# Patient Record
Sex: Female | Born: 1985
Health system: Southern US, Community
[De-identification: ages and names within clinical notes are randomized; demographics above are authoritative.]

## PROBLEM LIST (undated history)

## (undated) DIAGNOSIS — J302 Other seasonal allergic rhinitis: Secondary | ICD-10-CM

## (undated) DIAGNOSIS — M1710 Unilateral primary osteoarthritis, unspecified knee: Secondary | ICD-10-CM

## (undated) DIAGNOSIS — E282 Polycystic ovarian syndrome: Secondary | ICD-10-CM

## (undated) DIAGNOSIS — R112 Nausea with vomiting, unspecified: Secondary | ICD-10-CM

## (undated) DIAGNOSIS — N970 Female infertility associated with anovulation: Secondary | ICD-10-CM

## (undated) DIAGNOSIS — N840 Polyp of corpus uteri: Secondary | ICD-10-CM

## (undated) DIAGNOSIS — Z9889 Other specified postprocedural states: Secondary | ICD-10-CM

## (undated) DIAGNOSIS — M797 Fibromyalgia: Secondary | ICD-10-CM

## (undated) DIAGNOSIS — M7918 Myalgia, other site: Secondary | ICD-10-CM

## (undated) DIAGNOSIS — F329 Major depressive disorder, single episode, unspecified: Secondary | ICD-10-CM

## (undated) DIAGNOSIS — F32A Depression, unspecified: Secondary | ICD-10-CM

## (undated) DIAGNOSIS — I10 Essential (primary) hypertension: Secondary | ICD-10-CM

## (undated) DIAGNOSIS — M179 Osteoarthritis of knee, unspecified: Secondary | ICD-10-CM

## (undated) DIAGNOSIS — Z87440 Personal history of urinary (tract) infections: Secondary | ICD-10-CM

## (undated) HISTORY — DX: Female infertility associated with anovulation: N97.0

## (undated) HISTORY — PX: POLYPECTOMY: SHX149

## (undated) HISTORY — DX: Major depressive disorder, single episode, unspecified: F32.9

## (undated) HISTORY — DX: Depression, unspecified: F32.A

---
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
## 2008-10-26 ENCOUNTER — Ambulatory Visit: Payer: Self-pay | Admitting: Family Medicine

## 2008-10-26 DIAGNOSIS — N912 Amenorrhea, unspecified: Secondary | ICD-10-CM | POA: Insufficient documentation

## 2008-10-26 DIAGNOSIS — E282 Polycystic ovarian syndrome: Secondary | ICD-10-CM | POA: Insufficient documentation

## 2008-10-26 DIAGNOSIS — Z9884 Bariatric surgery status: Secondary | ICD-10-CM | POA: Insufficient documentation

## 2008-10-26 LAB — CONVERTED CEMR LAB
Beta hcg, urine, semiquantitative: NEGATIVE
Bilirubin Urine: NEGATIVE
Blood Glucose, AC Bkfst: 91 mg/dL
Glucose, Urine, Semiquant: NEGATIVE
Ketones, urine, test strip: NEGATIVE
pH: 5.5

## 2008-10-27 ENCOUNTER — Encounter: Payer: Self-pay | Admitting: Family Medicine

## 2008-11-03 LAB — CONVERTED CEMR LAB
ALT: 30 units/L (ref 0–35)
AST: 24 units/L (ref 0–37)
CO2: 23 meq/L (ref 19–32)
Calcium: 8.8 mg/dL (ref 8.4–10.5)
Chloride: 105 meq/L (ref 96–112)
Cholesterol: 180 mg/dL (ref 0–200)
FSH: 4.5 milliintl units/mL
Potassium: 4.5 meq/L (ref 3.5–5.3)
Prolactin: 6 ng/mL
Sodium: 143 meq/L (ref 135–145)
TSH: 4.61 microintl units/mL (ref 0.700–6.400)
Total Protein: 7 g/dL (ref 6.0–8.3)

## 2008-11-25 ENCOUNTER — Ambulatory Visit: Payer: Self-pay | Admitting: Family Medicine

## 2008-11-25 DIAGNOSIS — I10 Essential (primary) hypertension: Secondary | ICD-10-CM | POA: Insufficient documentation

## 2008-11-25 LAB — CONVERTED CEMR LAB
Blood Glucose, AC Bkfst: 116 mg/dL
Glucose, Urine, Semiquant: NEGATIVE
Nitrite: NEGATIVE
Protein, U semiquant: NEGATIVE
pH: 5.5

## 2008-11-26 ENCOUNTER — Encounter: Payer: Self-pay | Admitting: Family Medicine

## 2008-11-30 DIAGNOSIS — R7301 Impaired fasting glucose: Secondary | ICD-10-CM | POA: Insufficient documentation

## 2008-12-02 ENCOUNTER — Encounter: Payer: Self-pay | Admitting: Family Medicine

## 2008-12-06 ENCOUNTER — Telehealth (INDEPENDENT_AMBULATORY_CARE_PROVIDER_SITE_OTHER): Payer: Self-pay | Admitting: *Deleted

## 2008-12-21 ENCOUNTER — Telehealth: Payer: Self-pay | Admitting: Family Medicine

## 2009-01-09 ENCOUNTER — Ambulatory Visit: Payer: Self-pay | Admitting: Family Medicine

## 2009-02-28 ENCOUNTER — Ambulatory Visit: Payer: Self-pay | Admitting: Family Medicine

## 2009-02-28 DIAGNOSIS — N3000 Acute cystitis without hematuria: Secondary | ICD-10-CM | POA: Insufficient documentation

## 2009-02-28 LAB — CONVERTED CEMR LAB
Bilirubin Urine: NEGATIVE
Ketones, urine, test strip: NEGATIVE
Nitrite: NEGATIVE
Protein, U semiquant: NEGATIVE
Urobilinogen, UA: 0.2

## 2009-03-01 ENCOUNTER — Encounter: Payer: Self-pay | Admitting: Family Medicine

## 2009-03-08 ENCOUNTER — Telehealth: Payer: Self-pay | Admitting: Family Medicine

## 2009-03-20 ENCOUNTER — Telehealth: Payer: Self-pay | Admitting: Family Medicine

## 2009-04-07 ENCOUNTER — Ambulatory Visit: Payer: Self-pay | Admitting: Family Medicine

## 2009-04-07 ENCOUNTER — Telehealth: Payer: Self-pay | Admitting: Family Medicine

## 2009-04-07 DIAGNOSIS — R300 Dysuria: Secondary | ICD-10-CM | POA: Insufficient documentation

## 2009-04-07 LAB — CONVERTED CEMR LAB
Bilirubin Urine: NEGATIVE
Glucose, Urine, Semiquant: NEGATIVE
Ketones, urine, test strip: NEGATIVE
Protein, U semiquant: 100
Urobilinogen, UA: 0.2
pH: 5.5

## 2009-04-08 ENCOUNTER — Encounter: Payer: Self-pay | Admitting: Family Medicine

## 2009-04-12 ENCOUNTER — Ambulatory Visit: Payer: Self-pay | Admitting: Family Medicine

## 2009-04-13 ENCOUNTER — Telehealth (INDEPENDENT_AMBULATORY_CARE_PROVIDER_SITE_OTHER): Payer: Self-pay | Admitting: *Deleted

## 2009-07-04 ENCOUNTER — Ambulatory Visit: Payer: Self-pay | Admitting: Family Medicine

## 2009-07-09 ENCOUNTER — Encounter: Payer: Self-pay | Admitting: Family Medicine

## 2009-08-15 ENCOUNTER — Encounter: Payer: Self-pay | Admitting: Family Medicine

## 2009-10-05 ENCOUNTER — Ambulatory Visit: Payer: Self-pay | Admitting: Family Medicine

## 2009-10-05 LAB — CONVERTED CEMR LAB: Blood Glucose, AC Bkfst: 93 mg/dL

## 2009-11-02 ENCOUNTER — Encounter: Payer: Self-pay | Admitting: Family Medicine

## 2009-11-03 ENCOUNTER — Encounter: Payer: Self-pay | Admitting: Family Medicine

## 2009-11-03 LAB — CONVERTED CEMR LAB: Free T4: 1.1 ng/dL (ref 0.80–1.80)

## 2009-11-07 LAB — CONVERTED CEMR LAB
ALT: 23 units/L (ref 0–35)
AST: 14 units/L (ref 0–37)
Albumin: 3.7 g/dL (ref 3.5–5.2)
CO2: 26 meq/L (ref 19–32)
Calcium: 8.6 mg/dL (ref 8.4–10.5)
Chloride: 105 meq/L (ref 96–112)
Cholesterol: 152 mg/dL (ref 0–200)
Creatinine, Ser: 0.82 mg/dL (ref 0.40–1.20)
Hgb A1c MFr Bld: 5.7 % — ABNORMAL HIGH (ref <5.7)
Potassium: 4 meq/L (ref 3.5–5.3)
Sodium: 141 meq/L (ref 135–145)
TSH: 4.659 microintl units/mL — ABNORMAL HIGH (ref 0.350–4.500)
Total CHOL/HDL Ratio: 4.9
Total Protein: 6.2 g/dL (ref 6.0–8.3)

## 2009-12-12 ENCOUNTER — Encounter: Payer: Self-pay | Admitting: Family Medicine

## 2010-01-03 ENCOUNTER — Encounter: Payer: Self-pay | Admitting: Family Medicine

## 2010-01-05 ENCOUNTER — Ambulatory Visit: Payer: Self-pay | Admitting: Family Medicine

## 2010-03-27 NOTE — Letter (Signed)
Summary: [facility] [place]   Imported By: Lanelle Bal 07/17/2009 13:09:37  _____________________________________________________________________  External Attachment:    Type:   Image     Comment:   External Document

## 2010-03-27 NOTE — Assessment & Plan Note (Signed)
Summary: UA  Nurse Visit   Vitals Entered By: Payton Spark CMA (April 07, 2009 3:16 PM)  Allergies: 1)  ! Pcn 2)  ! Amoxicillin 3)  ! Ceclor 4)  ! Sulfa 5)  ! Erythromycin 6)  ! Enalapril Maleate Laboratory Results   Urine Tests    Routine Urinalysis   Color: yellow Appearance: Hazy Glucose: negative   (Normal Range: Negative) Bilirubin: negative   (Normal Range: Negative) Ketone: negative   (Normal Range: Negative) Spec. Gravity: 1.025   (Normal Range: 1.003-1.035) Blood: large   (Normal Range: Negative) pH: 5.5   (Normal Range: 5.0-8.0) Protein: 100   (Normal Range: Negative) Urobilinogen: 0.2   (Normal Range: 0-1) Nitrite: negative   (Normal Range: Negative) Leukocyte Esterace: small   (Normal Range: Negative)       Orders Added: 1)  T-Culture, Urine [[id number]] 2)  UA Dipstick w/o Micro (automated)  [81003]    Impression & Recommendations:  Problem # 1:  DYSURIA (ICD-788.1) Sent for cx.  treated twice in the past 6 wks with abx.  UA is inconclusive. The following medications were removed from the medication list:    Cipro 250 Mg Tabs (Ciprofloxacin hcl) .Marland Kitchen... 1 tab by mouth two times a day x 3 days    Macrodantin 100 Mg Caps (Nitrofurantoin macrocrystal) .Marland Kitchen... 1 capsule by mouth q 12 hrs x 7 days  Orders: T-Culture, Urine ([id number]) UA Dipstick w/o Micro (automated)  (81003)  Complete Medication List: 1)  Victoza 18 Mg/65ml Soln (Liraglutide) .... 1.2 mg subcutaneously injection daily 2)  Onglyza 5 Mg Tabs (Saxagliptin hcl) .Marland Kitchen.. 1 tab by mouth daily with dinner 3)  Losartan Potassium 25 Mg Tabs (Losartan potassium) .Marland Kitchen.. 1 tab by mouth daily

## 2010-03-27 NOTE — Assessment & Plan Note (Signed)
Summary: UTI/ IFG   Vital Signs:  Patient profile:   25 year old female Height:      69 inches Weight:      323 pounds BMI:     47.87 O2 Sat:      97 % on Room air Temp:     97.6 degrees F oral BP sitting:   146 / 88  (left arm) Cuff size:   large  Vitals Entered By: Payton Spark CMA (February 28, 2009 3:00 PM)  O2 Flow:  Room air CC: ? UTI    Primary Care Provider:  Seymour Bars DO  CC:  ? UTI .  History of Present Illness: 25 yo WF presents for 1 days of painful urination. Urinary frequency but no urgency.  She is able to completely empty her bladder.  No incontinence.  No fevers or chills.  Has some pelvic pain.  Had a UTI in Sept 2010.  She did have a 7 day period in November. She has lost 12 lbs.  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   She is less hungry on Victoza.  Still trying to adjust portion sizes.  Her AM fastings are around 90-110.  She has IFG.  She is having diarrhea almost everyday on Metformin.      Current Medications (verified): 1)  Metformin Hcl 500 Mg Tabs (Metformin Hcl) .Marland Kitchen.. 1 Tab By Mouth Bid 2)  Enalapril Maleate 10 Mg Tabs (Enalapril Maleate) .Marland Kitchen.. 1 Tab By Mouth Daily 3)  Victoza 18 Mg/72ml Soln (Liraglutide) .... 1.2 Mg Subcutaneously Injection Daily 4)  Novofine 32g X 6 Mm Misc (Insulin Pen Needle) .... Use Once Daily As Directed 5)  Prodigy Blood Glucose Test  Strp (Glucose Blood) .... Use Daily As Directed Dx:  Impaired Fasting Glucose, Pcos  Allergies (verified): 1)  ! Pcn 2)  ! Amoxicillin 3)  ! Ceclor 4)  ! Sulfa 5)  ! Erythromycin  Past History:  Past Medical History: Reviewed history from 01/09/2009 and no changes required. class III obesity elevated BP w/o hx of HTN PCOS, age 34 recurrent UTI hx of depression IFG  Family History: Reviewed history from 10/26/2008 and no changes required. mother obese, PCOS, DM father HTN, high chol, diabetes, depresion sister depression  Social  History: Reviewed history from 10/26/2008 and no changes required. Has Assoc. Degree. Lesbian relationship w/ Marchelle Folks. Lives with mom, dad and sister. Looking for work. No exercise. Never smoked.  Denies ETOH.    Review of Systems      See HPI  Physical Exam  General:  obese WF in NAD Head:  normocephalic and atraumatic.   Mouth:  good dentition and pharynx pink and moist.   Neck:  no masses.   Lungs:  Normal respiratory effort, chest expands symmetrically. Lungs are clear to auscultation, no crackles or wheezes. Heart:  Normal rate and regular rhythm. S1 and S2 normal without gallop, murmur, click, rub or other extra sounds. Abdomen:  suprapubic TTP.  No CVAT.  soft.   Skin:  color normal.     Impression & Recommendations:  Problem # 1:  ACUTE CYSTITIS (ICD-595.0) Treat with Cipro x 3 days.  F/U cx results given hx of frequent recurrence and abx allergies. Her updated medication list for this problem includes:    Cipro 250 Mg Tabs (Ciprofloxacin hcl) .Marland Kitchen... 1 tab by mouth two times a day x 3 days  Orders: UA Dipstick w/o Micro (automated)  (81003) T-Culture, Urine ([id number])  Encouraged to push clear liquids, get enough rest, and take acetaminophen as needed. To be seen in 10 days if no improvement, sooner if worse.  Problem # 2:  IMPAIRED FASTING GLUCOSE (ICD-790.21) Sugars still a little high but happy with 12 lbs wt loss, improved diet and some physical activity with Victoza.  Stop Metformin due to diarrhea that is not improving.  Start Onglyza to help with insulin resistance.  F/U in 2 mos.  Needs A1C.  Call if any readings < 70.   The following medications were removed from the medication list:    Metformin Hcl 500 Mg Tabs (Metformin hcl) .Marland Kitchen... 1 tab by mouth bid Her updated medication list for this problem includes:    Victoza 18 Mg/31ml Soln (Liraglutide) .Marland Kitchen... 1.2 mg subcutaneously injection daily    Onglyza 5 Mg Tabs (Saxagliptin hcl) .Marland Kitchen... 1 tab by mouth daily  with dinner  Problem # 3:  ESSENTIAL HYPERTENSION, BENIGN (ICD-401.1) BP remains high with Enalapril.  Unable to add HCTZ due to sulfa allergy.  Recheck in 2 mos and if still high, needs 2nd agent. Her updated medication list for this problem includes:    Enalapril Maleate 10 Mg Tabs (Enalapril maleate) .Marland Kitchen... 1 tab by mouth daily  BP today: 146/88 Prior BP: 144/82 (01/09/2009)  Labs Reviewed: K+: 4.5 (10/27/2008) Creat: : 0.84 (10/27/2008)   Chol: 180 (10/27/2008)   HDL: 41 (10/27/2008)   LDL: 101 (10/27/2008)   TG: 188 (10/27/2008)  Complete Medication List: 1)  Enalapril Maleate 10 Mg Tabs (Enalapril maleate) .Marland Kitchen.. 1 tab by mouth daily 2)  Victoza 18 Mg/45ml Soln (Liraglutide) .... 1.2 mg subcutaneously injection daily 3)  Onglyza 5 Mg Tabs (Saxagliptin hcl) .Marland Kitchen.. 1 tab by mouth daily with dinner 4)  Cipro 250 Mg Tabs (Ciprofloxacin hcl) .Marland Kitchen.. 1 tab by mouth two times a day x 3 days  Patient Instructions: 1)  Take Cipro x 3 days for UTI. 2)  Will call you w/ urine cx result on Friday to make sure it's the right antibiotic. 3)  Drink water.  Take OTC AZO standard for comfort. 4)  Stay on Victoza. 5)  Work on portion sizes and increased physical activity. 6)  Stop Metformin due to diarrhea. 7)  Add Onglyza once daily with dinner. 8)  Return for f/u diabetes in 2 mos. Prescriptions: CIPRO 250 MG TABS (CIPROFLOXACIN HCL) 1 tab by mouth two times a day x 3 days  #6 x 0   Entered and Authorized by:   Seymour Bars DO   Signed by:   Seymour Bars DO on 02/28/2009   Method used:   Electronically to        [facility] [phone]* (retail)       [address] [place], [place]  [address]       Ph: [phone]       Fax: [phone]   RxID:   [id number] ONGLYZA 5 MG TABS (SAXAGLIPTIN HCL) 1 tab by mouth daily with dinner  #30 x 2   Entered and Authorized by:   Seymour Bars DO   Signed by:   Seymour Bars DO on 02/28/2009   Method used:   Electronically to        [facility]  [phone]* (retail)       [address] [place], [place]  [address]       Ph: [phone]  Fax: [phone]   RxID:   [id number]   Laboratory Results   Urine Tests    Routine Urinalysis   Color: yellow Appearance: Clear Glucose: negative   (Normal Range: Negative) Bilirubin: negative   (Normal Range: Negative) Ketone: negative   (Normal Range: Negative) Spec. Gravity: <1.005   (Normal Range: 1.003-1.035) Blood: trace-intact   (Normal Range: Negative) pH: 6.0   (Normal Range: 5.0-8.0) Protein: negative   (Normal Range: Negative) Urobilinogen: 0.2   (Normal Range: 0-1) Nitrite: negative   (Normal Range: Negative) Leukocyte Esterace: moderate   (Normal Range: Negative)

## 2010-03-27 NOTE — Letter (Signed)
Summary: [facility] [place]   Imported By: Sherian Rein 12/26/2009 10:56:57  _____________________________________________________________________  External Attachment:    Type:   Image     Comment:   External Document

## 2010-03-27 NOTE — Progress Notes (Signed)
Summary: ? Allergic Reaction  Phone Note Call from Patient   Caller: Patient Summary of Call: Pt LMOM stating she was seen at [facility] for lip swelling. UC told her this was most likely due to the enalipril and recommended her to call you to have this changed. Please advise.  Initial call taken by: Payton Spark CMA,  March 20, 2009 11:13 AM   New Allergies: ! ENALAPRIL MALEATE New/Updated Medications: LOSARTAN POTASSIUM 25 MG TABS (LOSARTAN POTASSIUM) 1 tab by mouth daily New Allergies: ! ENALAPRIL MALEATEPrescriptions: LOSARTAN POTASSIUM 25 MG TABS (LOSARTAN POTASSIUM) 1 tab by mouth daily  #30 x 2   Entered and Authorized by:   Seymour Bars DO   Signed by:   Seymour Bars DO on 03/20/2009   Method used:   Electronically to        [facility] [phone]* (retail)       [address] [place], [place]  [address]       Ph: [phone]       Fax: [phone]   RxID:   [id number]   Appended Document: ? Allergic Reaction Pt aware

## 2010-03-27 NOTE — Assessment & Plan Note (Signed)
Summary: HTN/ IFG/ obesity   Vital Signs:  Patient profile:   25 year old female Height:      69 inches Weight:      335 pounds BMI:     49.65 O2 Sat:      96 % on Room air Pulse rate:   84 / minute BP sitting:   124 / 92  (left arm) Cuff size:   large  Vitals Entered By: Payton Spark CMA (Jul 04, 2009 3:24 PM)  O2 Flow:  Room air CC: F/U BP med- has been out for 1+ week. Also stopped onglyza due to indigestion.   Primary Care Provider:  Seymour Bars DO  CC:  F/U BP med- has been out for 1+ week. Also stopped onglyza due to indigestion.Chelsea [name]  History of Present Illness: 25 yo WF presents for f/u HTN and indigestion from her onglyza.  Her indigestion has improved off  the onlgyza.    She is not checking sugars for IFG.  She is on Victoza at 1.2 mg/ day but she is starting to gain weight again.  She is not having the satiety SE anymore.  She denies N/V from the medication.  She has not been exercising and has a poor diet.  She had been doing well on Losartan for HTN but ran out a couple wks ago.  Denies CP or SOB. Has L sided chronic knee pain, worse going up and down stairs, without swelling or redness.  Better with use of a knee brace.  Has been seen by [facility] for this.     Current Medications (verified): 1)  Victoza 18 Mg/6ml Soln (Liraglutide) .... 1.2 Mg Subcutaneously Injection Daily 2)  Losartan Potassium 25 Mg Tabs (Losartan Potassium) .Chelsea [name].. 1 Tab By Mouth Daily 3)  Cranberry Concentrate 500 Mg Caps (Cranberry) 4)  L-Lysine 500 Mg Tabs (Lysine) 5)  Zyrtec Allergy 10 Mg Tabs (Cetirizine Hcl)  Allergies (verified): 1)  ! Pcn 2)  ! Amoxicillin 3)  ! Ceclor 4)  ! Sulfa 5)  ! Erythromycin 6)  ! Enalapril Maleate 7)  ! Metformin Hcl  Past History:  Past Medical History: Reviewed history from 01/09/2009 and no changes required. class III obesity elevated BP w/o hx of HTN PCOS, age 56 recurrent UTI hx of depression IFG  Social History: Reviewed history from  10/26/2008 and no changes required. Has Assoc. Degree. Lesbian relationship w/ Marchelle Folks. Lives with mom, dad and sister. Looking for work. No exercise. Never smoked.  Denies ETOH.    Review of Systems      See HPI  Physical Exam  General:  morbidly obese WF in NAD Head:  normocephalic and atraumatic.   Eyes:  sclera non icteric Mouth:  good dentition and pharynx pink and moist.   Neck:  no masses.   Lungs:  Normal respiratory effort, chest expands symmetrically. Lungs are clear to auscultation, no crackles or wheezes. Heart:  Normal rate and regular rhythm. S1 and S2 normal without gallop, murmur, click, rub or other extra sounds. Abdomen:  soft, non-tender, normal bowel sounds, no distention, no masses, and no guarding.   Skin:  color normal.   Cervical Nodes:  No lymphadenopathy noted Psych:  good eye contact, not anxious appearing, and not depressed appearing.     Impression & Recommendations:  Problem # 1:  IMPAIRED FASTING GLUCOSE (ICD-790.21) Continue Victoza at next higher dose.  RTC in 2 mos for an A1C. The following medications were removed from the medication list:  Onglyza 5 Mg Tabs (Saxagliptin hcl) .Chelsea [name]... 1 tab by mouth daily with dinner Her updated medication list for this problem includes:    Victoza 18 Mg/82ml Soln (Liraglutide) .Chelsea [name]... 1.8  mg subcutaneously injection daily  Problem # 2:  OBESITY, CLASS III (ICD-278.01) BMI of 50 c/w class III+ obesity. She has really failed to lose wt with Victoza, low carb diet and the beginnings of an exercise plan but she fails to follow thru.  We discussed her co morbidities of obesity today - HTN, IFG, PCOS, knee pain....  I did suggest she look into bariatric surgery and suggested she attend info session at [facility].  phone # given.  Problem # 3:  ESSENTIAL HYPERTENSION, BENIGN (ICD-401.1) Restart Losartan.  Labs are UTD.  Secondary to her morbid obesity. Her updated medication list for this problem includes:    Losartan  Potassium 25 Mg Tabs (Losartan potassium) .Chelsea [name]... 1 tab by mouth daily  BP today: 124/92 Prior BP: 146/88 (02/28/2009)  Labs Reviewed: K+: 4.5 (10/27/2008) Creat: : 0.84 (10/27/2008)   Chol: 180 (10/27/2008)   HDL: 41 (10/27/2008)   LDL: 101 (10/27/2008)   TG: 188 (10/27/2008)  Complete Medication List: 1)  Victoza 18 Mg/74ml Soln (Liraglutide) .... 1.8  mg subcutaneously injection daily 2)  Losartan Potassium 25 Mg Tabs (Losartan potassium) .Chelsea [name].. 1 tab by mouth daily 3)  Cranberry Concentrate 500 Mg Caps (Cranberry) 4)  L-lysine 500 Mg Tabs (Lysine) 5)  Zyrtec Allergy 10 Mg Tabs (Cetirizine hcl)  Patient Instructions: 1)  Go up on Victoza to 1.8 mg injected once a day. 2)  Stay on Losartan for high BP. 3)  Work on healthy diet and 30 min of exercise 5 days/ wk. 4)  Go to info class on bariatric surgery to learn about your options. 5)  [phone] ask for the Bariatric surgery info  class. 6)  Return for follow up BP/ weight/ sugar in 3 mos. Prescriptions: LOSARTAN POTASSIUM 25 MG TABS (LOSARTAN POTASSIUM) 1 tab by mouth daily  #90 x 1   Entered and Authorized by:   Seymour Bars DO   Signed by:   Seymour Bars DO on 07/04/2009   Method used:   Print then Give to Patient   RxID:   [id number] LOSARTAN POTASSIUM 25 MG TABS (LOSARTAN POTASSIUM) 1 tab by mouth daily  #30 x 0   Entered and Authorized by:   Seymour Bars DO   Signed by:   Seymour Bars DO on 07/04/2009   Method used:   Electronically to        [facility] [phone]* (retail)       [address] [place], [place]  [address]       Ph: [phone]       Fax: [phone]   RxID:   [id number] 18 MG/3ML SOLN (LIRAGLUTIDE) 1.8  mg Subcutaneously injection daily  #6 pens x 1   Entered and Authorized by:   Seymour Bars DO   Signed by:   Seymour Bars DO on 07/04/2009   Method used:   Print then Give to Patient   RxID:   [id number]

## 2010-03-27 NOTE — Assessment & Plan Note (Signed)
Summary: f/u IFG/ HTN/ obesity   Vital Signs:  Patient profile:   25 year old female Height:      69 inches Weight:      328 pounds BMI:     48.61 O2 Sat:      98 % on Room air Pulse rate:   108 / minute BP sitting:   132 / 86  (left arm) Cuff size:   large  Vitals Entered By: Payton Spark CMA (October 05, 2009 9:44 AM)  O2 Flow:  Room air CC: F/U weight and HTN   Primary Care Provider:  Seymour Bars DO  CC:  F/U weight and HTN.  History of Present Illness: 25 yo WF presents for f/u IFG and obesity.  She is down 7 lbs in the last 2 months.  She is on Victoza 1.8 mg  once a day.  It does cause satiety but due to slow gastric emptying, she had more belching.  She has started eating more food from her garden.  She has been going to the pool more.  She is looking for work.    She is taking Losartan once a day for high BP.    Current Medications (verified): 1)  Victoza 18 Mg/57ml Soln (Liraglutide) .... 1.8  Mg Subcutaneously Injection Daily 2)  Losartan Potassium 25 Mg Tabs (Losartan Potassium) .Marland Kitchen.. 1 Tab By Mouth Daily 3)  Cranberry Concentrate 500 Mg Caps (Cranberry) 4)  L-Lysine 500 Mg Tabs (Lysine) 5)  Zyrtec Allergy 10 Mg Tabs (Cetirizine Hcl) 6)  Centrum Ultra Womens  Tabs (Multiple Vitamins-Minerals) .... Take 1 Tab By Mouth Once Daily  Allergies (verified): 1)  ! Pcn 2)  ! Amoxicillin 3)  ! Ceclor 4)  ! Sulfa 5)  ! Erythromycin 6)  ! Enalapril Maleate 7)  ! Metformin Hcl  Past History:  Past Medical History: Reviewed history from 01/09/2009 and no changes required. class III obesity elevated BP w/o hx of HTN PCOS, age 62 recurrent UTI hx of depression IFG  Past Surgical History: Reviewed history from 10/26/2008 and no changes required. tonsillectomy 2001 wisdom teetch 2004 I&D foot 1998  Family History: Reviewed history from 10/26/2008 and no changes required. mother obese, PCOS, DM father HTN, high chol, diabetes, depresion sister  depression  Social History: Reviewed history from 10/26/2008 and no changes required. Has Assoc. Degree. Lesbian relationship w/ Marchelle Folks. Lives with mom, dad and sister. Looking for work. No exercise. Never smoked.  Denies ETOH.    Review of Systems      See HPI  Physical Exam  General:  morbidly obese WF in NAD Head:  normocephalic and atraumatic.   Eyes:  sclera non icteric Mouth:  pharynx pink and moist and fair dentition.   Neck:  no masses.   Lungs:  Normal respiratory effort, chest expands symmetrically. Lungs are clear to auscultation, no crackles or wheezes. Heart:  Normal rate and regular rhythm. S1 and S2 normal without gallop, murmur, click, rub or other extra sounds. Abdomen:  soft, non-tender, normal bowel sounds, no distention, no masses, and no guarding.   Skin:  color normal.   Psych:  good eye contact, not anxious appearing, and not depressed appearing.     Impression & Recommendations:  Problem # 1:  IMPAIRED FASTING GLUCOSE (ICD-790.21) Fasting sugar normal today <100 on Victoza.  She has done well with Victoza and has continue to slowly lose wt on it.  Will continue and update her labs in SEPT.  Discussed making more healthy dietary  choices and increasing her exercise.  She never did call [facility] to attend class on bariatric surgery and I feel like she is only slightly motivated to lose wt at this point. Her updated medication list for this problem includes:    Victoza 18 Mg/79ml Soln (Liraglutide) .Marland Kitchen... 1.8  mg subcutaneously injection daily  Orders: Fingerstick ([address]) Glucose, (CBG) (60454) T-Hemoglobin A1C (09811)  Labs Reviewed: Creat: 0.84 (10/27/2008)     Problem # 2:  ESSENTIAL HYPERTENSION, BENIGN (ICD-401.1) Assessment: Improved Continue Losartan.  Update labs next month. Her updated medication list for this problem includes:    Losartan Potassium 25 Mg Tabs (Losartan potassium) .Marland Kitchen... 1 tab by mouth daily  Orders: T-Comprehensive Metabolic  Panel ([id number])  BP today: 132/86 Prior BP: 124/92 (07/04/2009)  Labs Reviewed: K+: 4.5 (10/27/2008) Creat: : 0.84 (10/27/2008)   Chol: 180 (10/27/2008)   HDL: 41 (10/27/2008)   LDL: 101 (10/27/2008)   TG: 188 (10/27/2008)  Problem # 3:  OBESITY, CLASS III (ICD-278.01) BMI 45 in the class III range. 7 # lost in 2 mos is fair with the help of Victoza for improving satiety and slowing gastric emptying. She has comorbidities of IFG, PCOS and HTN and she's only 23.  As per #1, I am concerned that she is still not buying into the need to make lifestyle changes for her health.  Complete Medication List: 1)  Victoza 18 Mg/74ml Soln (Liraglutide) .... 1.8  mg subcutaneously injection daily 2)  Losartan Potassium 25 Mg Tabs (Losartan potassium) .Marland Kitchen.. 1 tab by mouth daily 3)  Cranberry Concentrate 500 Mg Caps (Cranberry) 4)  L-lysine 500 Mg Tabs (Lysine) 5)  Zyrtec Allergy 10 Mg Tabs (Cetirizine hcl) 6)  Centrum Ultra Womens Tabs (Multiple vitamins-minerals) .... Take 1 tab by mouth once daily  Other Orders: T-TSH [id number]) T-Lipid Profile ([id number])  Patient Instructions: 1)  Stay on current meds. 2)  Keep up a healthy (low carb) diet with portion control and work on increasing physical activity. 3)  BP improved! 4)  Update fasting labs after 9/2 downstairs and I will call you w/ the results. 5)  REturn for follow up WT/ IFG in 3 mos.  Laboratory Results   Blood Tests     CBG Fasting:: 93mg /dL

## 2010-03-27 NOTE — Progress Notes (Signed)
Summary: Needs appt rescheduled  Phone Note Call from Patient Call back at Home Phone [phone]   Caller: Patient Call For: Seymour Bars DO Summary of Call: Pt calls and forgot appt yesterday because of jury duty so needs to re schedule thsi Initial call taken by: Kathlene November,  April 13, 2009 11:10 AM

## 2010-03-27 NOTE — Progress Notes (Signed)
Summary: UTI  Phone Note Call from Patient   Caller: Patient Summary of Call: Pt [facility] stating she felt better after 2 days of ABX but started w/ increased pain. Pt tried to call on-call doctor monday and tuesday night but did not hear back from anyone. Pt requests different Rx. Please advise.  Initial call taken by: Payton Spark CMA,  March 08, 2009 1:10 PM    New/Updated Medications: MACRODANTIN 100 MG CAPS (NITROFURANTOIN MACROCRYSTAL) 1 capsule by mouth q 12 hrs x 7 days Prescriptions: MACRODANTIN 100 MG CAPS (NITROFURANTOIN MACROCRYSTAL) 1 capsule by mouth q 12 hrs x 7 days  #14 x 0   Entered and Authorized by:   Seymour Bars DO   Signed by:   Seymour Bars DO on 03/08/2009   Method used:   Electronically to        [facility] [phone]* (retail)       [address] [place], [place]  [address]       Ph: [phone]       Fax: [phone]   RxID:   [id number]   Appended Document: UTI Pt aware

## 2010-03-27 NOTE — Assessment & Plan Note (Signed)
Summary: f/u IFG/ obesity   Vital Signs:  Patient profile:   25 year old female Height:      69 inches Weight:      327 pounds BMI:     48.46 O2 Sat:      98 % on Room air Pulse rate:   110 / minute BP sitting:   142 / 88  (left arm) Cuff size:   large  Vitals Entered By: Payton Spark CMA (January 05, 2010 9:21 AM)  O2 Flow:  Room air CC: F/U   Primary Care Provider:  Seymour Bars DO  CC:  F/U.  History of Present Illness: 25 yo WF presents for f/u IFG and morbid obesity.  Her BP is high even on the Losartan.  She is on Zithromax for pharyngitis.  She still has not attended bariatric surgery classes at [facility].  She did a period in October.  She had done a short term 'fad diet' and lost weight but then regained it.  Her weight has not changed.  She is still not exercising.  She has stopped losing wt on Victoza and is not checking her sugars.  She is off metfomin due to diarrhea.    Current Medications (verified): 1)  Victoza 18 Mg/73ml Soln (Liraglutide) .... 1.8  Mg Subcutaneously Injection Daily 2)  Losartan Potassium 25 Mg Tabs (Losartan Potassium) .Marland Kitchen.. 1 Tab By Mouth Daily 3)  Cranberry Concentrate 500 Mg Caps (Cranberry) 4)  L-Lysine 500 Mg Tabs (Lysine) 5)  Zyrtec Allergy 10 Mg Tabs (Cetirizine Hcl) 6)  Centrum Ultra Womens  Tabs (Multiple Vitamins-Minerals) .... Take 1 Tab By Mouth Once Daily  Allergies (verified): 1)  ! Pcn 2)  ! Amoxicillin 3)  ! Ceclor 4)  ! Sulfa 5)  ! Erythromycin 6)  ! Enalapril Maleate 7)  ! Metformin Hcl  Past History:  Past Medical History: class III obesity HTN PCOS, age 59 recurrent UTI hx of depression IFG  Review of Systems      See HPI  Physical Exam  General:  morbidly obese WF in NAD Head:  normocephalic and atraumatic.   Mouth:  o/p injected Neck:  shotty ant cervical chain LA Lungs:  Normal respiratory effort, chest expands symmetrically. Lungs are clear to auscultation, no crackles or wheezes. Heart:  Normal  rate and regular rhythm. S1 and S2 normal without gallop, murmur, click, rub or other extra sounds. Extremities:  trace pedal edema bilat Skin:  color normal.   Psych:  good eye contact, not anxious appearing, and not depressed appearing.     Impression & Recommendations:  Problem # 1:  IMPAIRED FASTING GLUCOSE (ICD-790.21) A1c 5.7 in Sept 2011 with risk factors of morbid obesity, HTN and PCOS. Since she has stopped losing wt on Victoza, will D/C and change to [facility].  Expect less diarrhea than on the short acting metformin.  We had a long talk about the necessary lifestyle changes needed and the options to persue bariatric surgery which she is not intrested in right now.   The following medications were removed from the medication list:    Victoza 18 Mg/74ml Soln (Liraglutide) .Marland Kitchen... 1.8  mg subcutaneously injection daily Her updated medication list for this problem includes:    Kombiglyze Xr 5-500 Mg Xr24h-tab (Saxagliptin-metformin) .Marland Kitchen... 1 tab by mouth daily  Labs Reviewed: Creat: 0.82 (11/02/2009)     Problem # 2:  ESSENTIAL HYPERTENSION, BENIGN (ICD-401.1) Assessment: Deteriorated BP still not at goal.  Will increase her Losartan from 25 to 50  mg daily. Labs are UTD. Her updated medication list for this problem includes:    Losartan Potassium 50 Mg Tabs (Losartan potassium) .Marland Kitchen... 1 tab by mouth daily  BP today: 142/88 Prior BP: 132/86 (10/05/2009)  Labs Reviewed: K+: 4.0 (11/02/2009) Creat: : 0.82 (11/02/2009)   Chol: 152 (11/02/2009)   HDL: 31 (11/02/2009)   LDL: 90 (11/02/2009)   TG: 154 (11/02/2009)  Problem # 3:  OBESITY, CLASS III (ICD-278.01) see #1. BMI 48.4 in the class III range complicated by IFG, HTN, PCOS, depression and dyslipidemia with a TG/HDL ratio of 4.9.    Complete Medication List: 1)  Losartan Potassium 50 Mg Tabs (Losartan potassium) .Marland Kitchen.. 1 tab by mouth daily 2)  Cranberry Concentrate 500 Mg Caps (Cranberry) 3)  L-lysine 500 Mg Tabs (Lysine) 4)   Zyrtec Allergy 10 Mg Tabs (Cetirizine hcl) 5)  Centrum Ultra Womens Tabs (Multiple vitamins-minerals) .... Take 1 tab by mouth once daily 6)  Kombiglyze Xr 5-500 Mg Xr24h-tab (Saxagliptin-metformin) .Marland Kitchen.. 1 tab by mouth daily  Patient Instructions: 1)  Increase Losartan to 50 mg once daily for high BP. 2)  Stop Victoza injections and change to once daily Kombiglyze. 3)  Stick to a LOW CARB/ LOW SUGAR diet with aim for 30+ min of exercise daily. 4)  Consider attending Bariatric surgery class at [facility]. 5)  Return for follow up in 4 mos. 6)  Call me if any problems on Kombiglyze. Prescriptions: KOMBIGLYZE XR 5-500 MG XR24H-TAB (SAXAGLIPTIN-METFORMIN) 1 tab by mouth daily  #30 x 3   Entered and Authorized by:   Seymour Bars DO   Signed by:   Seymour Bars DO on 01/05/2010   Method used:   Print then Give to Patient   RxID:   [id number] LOSARTAN POTASSIUM 50 MG TABS (LOSARTAN POTASSIUM) 1 tab by mouth daily  #30 x 3   Entered and Authorized by:   Seymour Bars DO   Signed by:   Seymour Bars DO on 01/05/2010   Method used:   Electronically to        [facility] [phone]* (retail)       [address] [place], [place]  [address]       Ph: [phone]       Fax: [phone]   RxID:   [id number]    Orders Added: 1)  Est. Patient Level IV [95638]

## 2010-03-27 NOTE — Letter (Signed)
Summary: [facility] [place]   Imported By: Lanelle Bal 08/30/2009 10:25:18  _____________________________________________________________________  External Attachment:    Type:   Image     Comment:   External Document

## 2010-03-27 NOTE — Letter (Signed)
[facility]   Imported By: Lanelle Bal 01/16/2010 15:10:49  _____________________________________________________________________  External Attachment:    Type:   Image     Comment:   External Document

## 2010-03-27 NOTE — Progress Notes (Signed)
Summary: please advise  Phone Note Call from Patient Call back at [phone]   Caller: Patient----live call Summary of Call: Thinks that she may have an uti. wants ov. Please return call. Initial call taken by: Warnell Forester,  April 07, 2009 2:34 PM  Follow-up for Phone Call        can come in for a UA only (nurse visit). Follow-up by: Seymour Bars DO,  April 07, 2009 2:36 PM

## 2010-05-07 ENCOUNTER — Ambulatory Visit: Payer: Self-pay | Admitting: Family Medicine

## 2011-01-21 ENCOUNTER — Emergency Department
Admission: EM | Admit: 2011-01-21 | Discharge: 2011-01-21 | Disposition: A | Discharge status: Home / Self Care | Payer: Managed Care, Other (non HMO) | Source: Home / Self Care | Attending: Emergency Medicine | Admitting: Emergency Medicine

## 2011-01-21 ENCOUNTER — Encounter: Payer: Self-pay | Admitting: *Deleted

## 2011-01-21 DIAGNOSIS — R05 Cough: Secondary | ICD-10-CM

## 2011-01-21 DIAGNOSIS — H6690 Otitis media, unspecified, unspecified ear: Secondary | ICD-10-CM

## 2011-01-21 DIAGNOSIS — J069 Acute upper respiratory infection, unspecified: Secondary | ICD-10-CM

## 2011-01-21 DIAGNOSIS — H6691 Otitis media, unspecified, right ear: Secondary | ICD-10-CM

## 2011-01-21 DIAGNOSIS — R059 Cough, unspecified: Secondary | ICD-10-CM

## 2011-01-21 MED ORDER — AZITHROMYCIN 250 MG PO TABS: ORAL_TABLET | ORAL | Status: AC

## 2011-01-21 NOTE — ED Provider Notes (Addendum)
History     CSN: [id number] Arrival date & time: No admission date for patient encounter.   First MD Initiated Contact with Patient 01/21/11 1036      No chief complaint on file.   (Consider location/radiation/quality/duration/timing/severity/associated sxs/prior treatment) HPI Chelsea [name] is a 25 y.o. female who complains of onset of cold symptoms for 7 days. She went to a local urgent care a few days ago and was told she has a viral infection and was not given any medicine. No sore throat + cough No pleuritic pain + wheezing + nasal congestion + post-nasal drainage + sinus pain/pressure No chest congestion No itchy/red eyes + earache No hemoptysis No SOB + chills/sweats + fever No nausea No vomiting No abdominal pain No diarrhea No skin rashes + fatigue + myalgias No headache     No past medical history on file.  No past surgical history on file.  No family history on file.  History  Substance Use Topics  . Smoking status: Not on file  . Smokeless tobacco: Not on file  . Alcohol Use: Not on file    OB History    No data available      Review of Systems  Allergies  Amoxicillin; Cefaclor; Enalapril maleate; Erythromycin; Metformin; Penicillins; and Sulfonamide derivatives  Home Medications  No current outpatient prescriptions on file.  There were no vitals taken for this visit.  Physical Exam  Nursing note and vitals reviewed. Constitutional: She is oriented to person, place, and time. She appears well-developed and well-nourished.  HENT:  Head: Normocephalic and atraumatic.  Right Ear: External ear and ear canal normal. Tympanic membrane is erythematous and bulging.  Left Ear: Tympanic membrane, external ear and ear canal normal.  Nose: Mucosal edema and rhinorrhea present.  Mouth/Throat: Posterior oropharyngeal erythema present. No oropharyngeal exudate or posterior oropharyngeal edema.  Neck: Neck supple.  Cardiovascular: Regular rhythm and  normal heart sounds.   Pulmonary/Chest: Effort normal. No respiratory distress. She has wheezes (Mild scattered expiratory wheezes).  Neurological: She is alert and oriented to person, place, and time.  Skin: Skin is warm and dry.  Psychiatric: She has a normal mood and affect. Her speech is normal.    ED Course  Procedures (including critical care time)  Labs Reviewed - No data to display No results found.   No diagnosis found.    MDM   1)  Take the prescribed antibiotic as instructed.  She reports a history of an allergy to erythromycin however she can take Z-Pak without any problem. I also gave her samples of cough medicine. If she needs more than that and she can continue her over-the-counter Delsym or Robitussin.  If she is not improving in the next few days, she should call back and we will prescribe her some prednisone and/or a inhaler. 2)  Use nasal saline solution (over the counter) at least 3 times a day. 3)  Use over the counter decongestants like Zyrtec-D every 12 hours as needed to help with congestion.  If you have hypertension, do not take medicines with sudafed.  4)  Can take tylenol every 6 hours or motrin every 8 hours for pain or fever. 5)  Follow up with your primary doctor if no improvement in 5-7 days, sooner if increasing pain, fever, or new symptoms.      Lily Kocher, MD 01/21/11 1055  Lily Kocher, MD 01/21/11 1055

## 2011-01-21 NOTE — ED Notes (Signed)
Patient c/o dry cough, fever, and sinus pain x 1 week. Taken Robitussin and Sudafed OTC.

## 2011-01-27 ENCOUNTER — Telehealth: Payer: Self-pay | Admitting: *Deleted

## 2011-11-01 ENCOUNTER — Ambulatory Visit (INDEPENDENT_AMBULATORY_CARE_PROVIDER_SITE_OTHER): Payer: Managed Care, Other (non HMO) | Admitting: Sports Medicine

## 2011-11-01 ENCOUNTER — Encounter: Payer: Self-pay | Admitting: *Deleted

## 2011-11-01 ENCOUNTER — Emergency Department
Admission: EM | Admit: 2011-11-01 | Discharge: 2011-11-01 | Disposition: A | Discharge status: Home / Self Care | Payer: Managed Care, Other (non HMO) | Source: Home / Self Care

## 2011-11-01 ENCOUNTER — Emergency Department (INDEPENDENT_AMBULATORY_CARE_PROVIDER_SITE_OTHER): Payer: Managed Care, Other (non HMO)

## 2011-11-01 DIAGNOSIS — M79609 Pain in unspecified limb: Secondary | ICD-10-CM

## 2011-11-01 DIAGNOSIS — M79672 Pain in left foot: Secondary | ICD-10-CM | POA: Insufficient documentation

## 2011-11-01 DIAGNOSIS — M79673 Pain in unspecified foot: Secondary | ICD-10-CM

## 2011-11-01 HISTORY — DX: Essential (primary) hypertension: I10

## 2011-11-01 HISTORY — DX: Polycystic ovarian syndrome: E28.2

## 2011-11-01 NOTE — Progress Notes (Signed)
Patient ID: Chelsea [name], female   DOB: 1985-06-27, 26 y.o.   MRN: [id number]  Subjective:    I'm seeing this patient as a consultation for:  Dr. Alvester Morin  CC: Foot pain  HPI: [name] is a very pleasant 26 year old female who yesterday took a misstep and developed immediate, and intense pain along the plantar aspect of her left foot at the calcaneal insertion. It continued to be painful, localized, and did not radiate. It was particularly painful the first few steps this morning.   Past medical history, Surgical history, Family history, Social history, Allergies, and medications have been entered into the medical record, reviewed, and no changes needed.   Review of Systems: No headache, visual changes, nausea, vomiting, diarrhea, constipation, dizziness, abdominal pain, skin rash, fevers, chills, night sweats, weight loss, body aches, joint swelling, muscle aches, chest pain, or shortness of breath.   Objective:   Vitals:  Afebrile, vital signs stable. General: Well Developed, well nourished, and in no acute distress.  Neuro: Alert and oriented x3, extra-ocular muscles intact.  Skin: Warm and dry, no rashes noted.  Respiratory: Not using accessory muscles, speaking in full sentences.  Cardiovascular: Pulses palpable, no extremity edema. Left foot is tender to palpation at the calcaneal insertion of the plantar fascia. She has good motion. Pes planus bilaterally. No tenderness to palpation behind either malleolus. Good strength to resisted inversion, eversion, dorsi, and plantar flexion.  Impression and Recommendations:

## 2011-11-01 NOTE — ED Notes (Signed)
Patient reports feeling pain on the lateral sole of left foot last night after walking out on her deck bare foot. Cannot visualize a splinter. Pain 10/10 to touch.

## 2011-11-01 NOTE — ED Provider Notes (Signed)
History     CSN: [id number]  Arrival date & time 11/01/11  4098   First MD Initiated Contact with Patient 11/01/11 0945      Chief Complaint  Patient presents with  . Foot Pain    HPI Patient presents today with left heel pain x2 days. Patient states that she was walking down stairs on her deck barefoot and then came upstairs and noticed persistence left-sided heel pain. Patient denies any known trauma associated with this. Patient does report that her back is wooden. Patient is concerned this may be a splinter. Patient reports heel pain been relatively diffuse. Pain has been persistent since this point. No numbness or tingling.  Past Medical History  Diagnosis Date  . Hypertension   . PCOS (polycystic ovarian syndrome)     Past Surgical History  Procedure Date  . Tonsillectomy   . Foot surgery     right    Family History  Problem Relation Age of Onset  . Diabetes Mother   . Hypertension Father     History  Substance Use Topics  . Smoking status: Never Smoker   . Smokeless tobacco: Never Used  . Alcohol Use: No    OB History    Grav Para Term Preterm Abortions TAB SAB Ect Mult Living                  Review of Systems  All other systems reviewed and are negative.    Allergies  Amoxicillin; Cefaclor; Enalapril maleate; Erythromycin; Metformin; Penicillins; and Sulfonamide derivatives  Home Medications   Current Outpatient Rx  Name Route Sig Dispense Refill  . L-LYSINE 500 MG PO CAPS Oral Take by mouth.        BP 137/87  Pulse 91  Resp 16  Ht 5\' 9"  (1.753 m)  Wt 328 lb (148.78 kg)  BMI 48.44 kg/m2  SpO2 97%  LMP 10/01/2011  Physical Exam  Constitutional:       Morbidly obese NAD   HENT:  Head: Normocephalic and atraumatic.  Eyes: Conjunctivae are normal. Pupils are equal, round, and reactive to light.  Neck: Normal range of motion. Neck supple.  Cardiovascular: Normal rate and regular rhythm.   Pulmonary/Chest: Effort normal and breath  sounds normal.  Abdominal: Soft.  Musculoskeletal:       + TTP alonng dorsum of posterior heel.  No particular focal points of pain.  + mild TTP over distribution of tarsal tunnel.  + Pain with resisted ankle eversion.  + pain on heel with ambulation.  Noted marked pes planus with ambulation.    Neurological: She is alert.  Skin: Skin is warm.    ED Course  Procedures (including critical care time)  Labs Reviewed - No data to display Dg Os Calcis Left  11/01/2011  *RADIOLOGY REPORT*  Clinical Data: Heel pain, evaluate for foreign body  LEFT OS CALCIS - 2+ VIEW  Comparison: None.  Findings: No fracture or dislocation is seen.  No radiopaque foreign body is seen.  IMPRESSION: No radiopaque foreign body is seen.   Original Report Authenticated By: Charline Bills, M.D.      1. Heel pain       MDM  Overall exam is not consistent with foreign body.   No visible laceration or focal areas of point tenderness.  Xrays negative for foreign body.  Given baseline body habitus, suspect plantar fascia pathology vs. Tarsal tunnel syndrome vs. A neuroma.  Will place in post op shoe with plans for  follow up with sports medicine for custom orthotics given marked pes planus.  Discussed general care.  Pt introduced to sports medicine physician while at urgent care.      The patient and/or caregiver has been counseled thoroughly with regard to treatment plan and/or medications prescribed including dosage, schedule, interactions, rationale for use, and possible side effects and they verbalize understanding. Diagnoses and expected course of recovery discussed and will return if not improved as expected or if the condition worsens. Patient and/or caregiver verbalized understanding.              Doree Albee, MD 11/01/11 1105

## 2011-11-01 NOTE — Assessment & Plan Note (Signed)
Her symptoms are highly suggestive of plantar fasciitis/plantar fascia rupture. Dr. Alvester Morin will place her in a postop shoe, and give her some pain medicines. I also provided her with home rehabilitation exercises. She will see me back at which point we can consider heel cups, and custom orthotics in the meantime. I would like the ultrasound for, and offer an ultrasound guided injection at that time.

## 2012-01-30 ENCOUNTER — Emergency Department
Admission: EM | Admit: 2012-01-30 | Discharge: 2012-01-30 | Disposition: A | Discharge status: Home / Self Care | Payer: Managed Care, Other (non HMO) | Source: Home / Self Care | Attending: Family Medicine | Admitting: Family Medicine

## 2012-01-30 ENCOUNTER — Emergency Department (INDEPENDENT_AMBULATORY_CARE_PROVIDER_SITE_OTHER): Payer: Managed Care, Other (non HMO)

## 2012-01-30 ENCOUNTER — Encounter: Payer: Self-pay | Admitting: Emergency Medicine

## 2012-01-30 ENCOUNTER — Ambulatory Visit (INDEPENDENT_AMBULATORY_CARE_PROVIDER_SITE_OTHER): Payer: Managed Care, Other (non HMO) | Admitting: Sports Medicine

## 2012-01-30 DIAGNOSIS — X500XXA Overexertion from strenuous movement or load, initial encounter: Secondary | ICD-10-CM

## 2012-01-30 DIAGNOSIS — S99919A Unspecified injury of unspecified ankle, initial encounter: Secondary | ICD-10-CM

## 2012-01-30 DIAGNOSIS — M25562 Pain in left knee: Secondary | ICD-10-CM

## 2012-01-30 DIAGNOSIS — M25569 Pain in unspecified knee: Secondary | ICD-10-CM

## 2012-01-30 DIAGNOSIS — S8392XA Sprain of unspecified site of left knee, initial encounter: Secondary | ICD-10-CM | POA: Insufficient documentation

## 2012-01-30 DIAGNOSIS — IMO0002 Reserved for concepts with insufficient information to code with codable children: Secondary | ICD-10-CM

## 2012-01-30 NOTE — Progress Notes (Signed)
SPORTS MEDICINE CONSULTATION REPORT  Subjective:    I'm seeing this patient as a consultation for:  Dr. Alvester Morin  CC: Left knee pain  HPI: This is a very pleasant 70 female who comes in after a fall yesterday. She slipped on some month, fell backwards, hyperflexing her left knee, and hyperextending her right knee. She has swelling immediately, and has pain she localizes along the lateral aspect of her left knee. The pain is localized, does not radiate, it is severe. She denies any mechanical symptoms.  Past medical history, Surgical history, Family history, Social history, Allergies, and medications have been entered into the medical record, reviewed, and no changes needed.   Review of Systems: No headache, visual changes, nausea, vomiting, diarrhea, constipation, dizziness, abdominal pain, skin rash, fevers, chills, night sweats, weight loss, swollen lymph nodes, body aches, joint swelling, muscle aches, chest pain, shortness of breath, mood changes, visual or auditory hallucinations.   Objective:   Vitals:  Afebrile, vital signs stable. General: Well Developed, obese, and in no acute distress.  Neuro/Psych: Alert and oriented x3, extra-ocular muscles intact, able to move all 4 extremities.  Skin: Warm and dry, no rashes noted.  Respiratory: Not using accessory muscles, speaking in full sentences, trachea midline.  Cardiovascular: Pulses palpable, no extremity edema. Abdomen: Does not appear distended. Left knee: Is very difficult to discern bony landmarks due to habitus. There is discrete tenderness to palpation along the mid lateral joint line. ROM full in flexion and extension and lower leg rotation. No pain with terminal flexion. Ligaments with solid consistent endpoints including ACL, PCL, LCL, MCL. There was reproduction of her pain with varus stressing. Non painful patellar compression. Patellar glide without crepitus. Patellar and quadriceps tendons unremarkable. Hamstring and  quadriceps strength is normal.   I did review her x-rays, they show spurring of the patella, as well as decreased joint space in the medial tibiofemoral compartment. There are no acute fracture, dislocation.  Impression and Recommendations:   This case required medical decision making of moderate complexity.

## 2012-01-30 NOTE — ED Notes (Signed)
Patient has had problems with knee before, and had hydrocodone left over from previous injury. She also has a knee sleeve which she feels is not helping.

## 2012-01-30 NOTE — ED Provider Notes (Signed)
History     CSN: [id number]  Arrival date & time 01/30/12  1335   First MD Initiated Contact with Patient 01/30/12 1336      Chief Complaint  Patient presents with  . Knee Injury   HPI  L knee pain x 2 days.  Pt's R leg slipped from under her yesterday. All of her weight went on to her L leg/knee. Pt states that her knee buckled and she felt a pop in her knee. Has had mild swelling since this point as well as pain.  Pain is predominantly anterior with some involvement in lateral and medial compartments. Area of most tenderness is over lateral L knee. Pain is persistent, but worsened with weight bearing. Pt states that she has had chronic knee popping for an extended period of time.   Past Medical History  Diagnosis Date  . Hypertension   . PCOS (polycystic ovarian syndrome)     Past Surgical History  Procedure Date  . Tonsillectomy   . Foot surgery     right    Family History  Problem Relation Age of Onset  . Diabetes Mother   . Polycystic ovary syndrome Mother   . Hypertension Father   . Diabetes Father     History  Substance Use Topics  . Smoking status: Never Smoker   . Smokeless tobacco: Never Used  . Alcohol Use: Yes    OB History    Grav Para Term Preterm Abortions TAB SAB Ect Mult Living                  Review of Systems  All other systems reviewed and are negative.    Allergies  Amoxicillin; Cefaclor; Enalapril maleate; Erythromycin; Metformin; Penicillins; and Sulfonamide derivatives  Home Medications   Current Outpatient Rx  Name  Route  Sig  Dispense  Refill  . HYDROCODONE-ACETAMINOPHEN 10-325 MG PO TABS   Oral   Take 1 tablet by mouth every 6 (six) hours as needed.         . IBUPROFEN 200 MG PO TABS   Oral   Take 200 mg by mouth every 6 (six) hours as needed.         . L-LYSINE 500 MG PO CAPS   Oral   Take by mouth.             BP 157/94  Pulse 87  Temp 98.2 F (36.8 C) (Oral)  Resp 20  Ht 5\' 9"  (1.753 m)  Wt 337  lb (152.862 kg)  BMI 49.77 kg/m2  SpO2 97%  LMP 12/31/2011  Physical Exam  Constitutional:       Morbidly obese    HENT:  Head: Normocephalic and atraumatic.  Right Ear: External ear normal.  Left Ear: External ear normal.  Eyes: Conjunctivae normal are normal. Pupils are equal, round, and reactive to light.  Neck: Normal range of motion. Neck supple.  Cardiovascular: Normal rate, regular rhythm and normal heart sounds.   Pulmonary/Chest: Effort normal and breath sounds normal.  Abdominal: Soft.  Musculoskeletal:       + TTP over antero lateral knee.  + TTP over insertion point of iliotibial band.  + Pain with resisted knee extension.  + painful patellar compression.  + mcmurrays with pain in medial and lateral compartments.  Able to walk 4 steps Valgus deformity with gait.     Neurological: She is alert.  Skin: Skin is warm.    ED Course  Procedures (including critical care time)  Labs Reviewed - No data to display Dg Knee Complete 4 Views Left  01/30/2012  *RADIOLOGY REPORT*  Clinical Data: Injury  LEFT KNEE - COMPLETE 4+ VIEW  Comparison: None.  Findings: Four views of the left knee submitted.  No acute fracture or subluxation.  Mild narrowing of medial joint compartment.  Mild spurring of medial and lateral femoral condyle.  Minimal spurring of patella.  IMPRESSION: No acute fracture or subluxation.  Mild degenerative changes as described above.   Original Report Authenticated By: Natasha Mead, M.D.      1. Knee pain, left       MDM  Multifactorial knee pain with contributions of OA, patellar tendinitis and ? ITB syndrome.  Will consults sports medicine to better assess.  Treatment plan per sports medicine.      The patient and/or caregiver has been counseled thoroughly with regard to treatment plan and/or medications prescribed including dosage, schedule, interactions, rationale for use, and possible side effects and they verbalize understanding. Diagnoses and  expected course of recovery discussed and will return if not improved as expected or if the condition worsens. Patient and/or caregiver verbalized understanding.             Doree Albee, MD 01/30/12 [phone]

## 2012-01-30 NOTE — ED Notes (Signed)
Left knee injury Saturday (4 days ago) fell in her house

## 2012-01-30 NOTE — Assessment & Plan Note (Signed)
Symptoms and exam are suggestive of an LCL sprain, grade 1. With subjective swelling, we do need to keep intra-articular pathology in the back of our minds should she not get better. Knee sleeve was placed, she will be given analgesic medicine, and will do topical cryotherapy. I've also given her the LCL sprain rehabilitation exercises. She will come back to see me in 2 weeks.

## 2012-02-13 ENCOUNTER — Encounter: Payer: Self-pay | Admitting: Sports Medicine

## 2012-02-13 ENCOUNTER — Ambulatory Visit (INDEPENDENT_AMBULATORY_CARE_PROVIDER_SITE_OTHER): Payer: Managed Care, Other (non HMO) | Admitting: Sports Medicine

## 2012-02-13 VITALS — Temp 98.3°F | Wt 333.0 lb

## 2012-02-13 DIAGNOSIS — M2240 Chondromalacia patellae, unspecified knee: Secondary | ICD-10-CM

## 2012-02-13 DIAGNOSIS — B9789 Other viral agents as the cause of diseases classified elsewhere: Secondary | ICD-10-CM

## 2012-02-13 DIAGNOSIS — M25561 Pain in right knee: Secondary | ICD-10-CM | POA: Insufficient documentation

## 2012-02-13 DIAGNOSIS — J0100 Acute maxillary sinusitis, unspecified: Secondary | ICD-10-CM | POA: Insufficient documentation

## 2012-02-13 DIAGNOSIS — M170 Bilateral primary osteoarthritis of knee: Secondary | ICD-10-CM | POA: Insufficient documentation

## 2012-02-13 DIAGNOSIS — S8392XA Sprain of unspecified site of left knee, initial encounter: Secondary | ICD-10-CM

## 2012-02-13 DIAGNOSIS — B349 Viral infection, unspecified: Secondary | ICD-10-CM

## 2012-02-13 LAB — POCT INFLUENZA A/B
Influenza A, POC: NEGATIVE
Influenza B, POC: NEGATIVE

## 2012-02-13 LAB — POCT RAPID STREP A (OFFICE): Rapid Strep A Screen: NEGATIVE

## 2012-02-13 MED ORDER — AZITHROMYCIN 250 MG PO TABS: ORAL_TABLET | ORAL | Status: DC

## 2012-02-13 MED ORDER — FLUTICASONE PROPIONATE 50 MCG/ACT NA SUSP: NASAL | Status: DC

## 2012-02-13 NOTE — Progress Notes (Signed)
Subjective:    CC: Followup  HPI: Left knee pain: Diagnosed with a mild grade 1 LCL sprain at last visit. She was placed in a knee sleeve, and given rehabilitation exercises. This is better.  Bilateral patellofemoral chondromalacia: Pain is present anteriorly, worse with going up and down stairs, and has been present for a long time, I did give her some rehabilitation exercises and oral NSAIDs at the last visit, she has lost the rehabilitation form. Pain is localized as above, doesn't radiate, is moderate. Denies swelling, or mechanical symptoms.  Upper respiratory infection: Doesn't since yesterday, sneezing, coughing, subjective fevers and chills, pressure over her sinuses, nasal drainage. No muscle aches, body aches, or GI symptoms. No shortness of breath.  Past medical history, Surgical history, Family history, Social history, Allergies, and medications have been entered into the medical record, reviewed, and no changes needed.   Review of Systems: No fevers, chills, night sweats, weight loss, chest pain, or shortness of breath.   Objective:    General: Well Developed, well nourished, and in no acute distress.  Neuro: Alert and oriented x3, extra-ocular muscles intact.  HEENT: Normocephalic, atraumatic, pupils equal round reactive to light, neck supple, no masses, no lymphadenopathy, thyroid nonpalpable. Normal oropharynx, nasopharynx, tympanic membranes unremarkable. Skin: Warm and dry, no rashes. Cardiac: Regular rate and rhythm, no murmurs rubs or gallops.  Respiratory: Clear to auscultation bilaterally. Not using accessory muscles, speaking in full sentences. Bilateral Knee: Normal to inspection with no erythema or effusion or obvious bony abnormalities. Palpation normal with no warmth, joint line tenderness, patellar tenderness, or condyle tenderness. ROM full in flexion and extension and lower leg rotation. Ligaments with solid consistent endpoints including ACL, PCL, LCL, MCL.  In both knees. Negative Mcmurray's, Apley's, and Thessalonian tests. Painful patellar compression with crepitus at the knees taken through the range of motion. Patellar and quadriceps tendons unremarkable. Hamstring and quadriceps strength is normal.   Impression and Recommendations:

## 2012-02-13 NOTE — Assessment & Plan Note (Signed)
Symptoms are more highly suggestive of acute maxillary sinusitis. Strep and flu tests were negative. Allergic to penicillin, will do azithromycin and Flonase.

## 2012-02-13 NOTE — Assessment & Plan Note (Signed)
LCL sprain resolved. Still has pain related to patellofemoral syndrome.

## 2012-02-13 NOTE — Assessment & Plan Note (Signed)
Anterior knee pain worse with going up and down stairs. Degenerative disease on x-ray. Has failed oral NSAIDs, but has been wishy-washy with rehabilitation exercises. I have printed her out another form, she will do this for the next several weeks, and return to see me in 2-3 weeks. If no better we can inject both knee joints.

## 2012-02-26 ENCOUNTER — Emergency Department
Admission: EM | Admit: 2012-02-26 | Discharge: 2012-02-26 | Disposition: A | Discharge status: Home / Self Care | Payer: Managed Care, Other (non HMO) | Source: Home / Self Care | Attending: Family Medicine | Admitting: Family Medicine

## 2012-02-26 ENCOUNTER — Encounter: Payer: Self-pay | Admitting: *Deleted

## 2012-02-26 DIAGNOSIS — J069 Acute upper respiratory infection, unspecified: Secondary | ICD-10-CM

## 2012-02-26 DIAGNOSIS — M940 Chondrocostal junction syndrome [Tietze]: Secondary | ICD-10-CM

## 2012-02-26 MED ORDER — BENZONATATE 200 MG PO CAPS: 200.0000 mg | ORAL_CAPSULE | Freq: Every day | ORAL | Status: DC

## 2012-02-26 MED ORDER — DOXYCYCLINE HYCLATE 100 MG PO CAPS: 100.0000 mg | ORAL_CAPSULE | Freq: Two times a day (BID) | ORAL | Status: DC

## 2012-02-26 NOTE — ED Notes (Signed)
Pt c/o sore throat, productive cough, and ears itching x 1 day. She took advil at 0900.

## 2012-02-26 NOTE — ED Provider Notes (Signed)
History     CSN: [id number]  Arrival date & time 02/26/12  1020   First MD Initiated Contact with Patient 02/26/12 1041      Chief Complaint  Patient presents with  . Sore Throat  . Cough      HPI Comments: Patient awoke yesterday with hoarseness and a mild sore throat, worse today.  Today she had chills, low grade fever, and mild cough.  No myalgias or headache.  She states that she recovered from a sinus infection about 2 weeks ago.  She states that she was briefly exposed to several relatives who had influenza, and is concerned that she may also have the flu  The history is provided by the patient.    Past Medical History  Diagnosis Date  . Hypertension   . PCOS (polycystic ovarian syndrome)     Past Surgical History  Procedure Date  . Tonsillectomy   . Foot surgery     right    Family History  Problem Relation Age of Onset  . Diabetes Mother   . Polycystic ovary syndrome Mother   . Hypertension Father   . Diabetes Father     History  Substance Use Topics  . Smoking status: Never Smoker   . Smokeless tobacco: Never Used  . Alcohol Use: Yes    OB History    Grav Para Term Preterm Abortions TAB SAB Ect Mult Living                  Review of Systems + sore throat + cough + hoarse No pleuritic pain but has tightness in anterior chest No wheezing + nasal congestion + post-nasal drainage No sinus pain/pressure No itchy/red eyes ? earache No hemoptysis No SOB + fever, + chills No nausea No vomiting No abdominal pain No diarrhea No urinary symptoms No skin rashes + fatigue No myalgias No headache Used OTC meds without relief  Allergies  Amoxicillin; Cefaclor; Enalapril maleate; Erythromycin; Metformin; Penicillins; and Sulfonamide derivatives  Home Medications   Current Outpatient Rx  Name  Route  Sig  Dispense  Refill  . NAPROXEN 250 MG PO TABS   Oral   Take 250 mg by mouth as needed.         . AZITHROMYCIN 250 MG PO TABS     Take 2 tablets (500 mg) on  Day 1,  followed by 1 tablet (250 mg) once daily on Days 2 through 5.   6 each   0   . BENZONATATE 200 MG PO CAPS   Oral   Take 1 capsule (200 mg total) by mouth at bedtime. Take as needed for cough   12 capsule   0   . DOXYCYCLINE HYCLATE 100 MG PO CAPS   Oral   Take 1 capsule (100 mg total) by mouth 2 (two) times daily. (Rx void after 03/04/12)   14 capsule   0   . FLUTICASONE PROPIONATE 50 MCG/ACT NA SUSP      One spray in each nostril twice a day, use left hand for right nostril, and right hand for left nostril.   48 g   3   . HYDROCODONE-ACETAMINOPHEN 10-325 MG PO TABS   Oral   Take 1 tablet by mouth every 6 (six) hours as needed.         . IBUPROFEN 200 MG PO TABS   Oral   Take 200 mg by mouth every 6 (six) hours as needed.         Marland Kitchen  L-LYSINE 500 MG PO CAPS   Oral   Take by mouth.             BP 143/103  Pulse 90  Temp 98.2 F (36.8 C) (Oral)  Resp 18  Ht 5\' 9"  (1.753 m)  Wt 328 lb (148.78 kg)  BMI 48.44 kg/m2  SpO2 96%  LMP 12/31/2011  Physical Exam Nursing notes and Vital Signs reviewed. Appearance:  Patient appears stated age, and in no acute distress.  Patient is obese (BMI 48.4) Eyes:  Pupils are equal, round, and reactive to light and accomodation.  Extraocular movement is intact.  Conjunctivae are not inflamed  Ears:  Canals normal.  Tympanic membranes normal.  Nose:  Mildly congested turbinates.  No sinus tenderness.  Marland Kitchen  Pharynx:  Normal Neck:  Supple.  Tender shotty posterior nodes are palpated bilaterally  Lungs:  Clear to auscultation.  Breath sounds are equal.  Chest:  Distinct tenderness to palpation over the mid-sternum.  Heart:  Regular rate and rhythm without murmurs, rubs, or gallops.  Abdomen:  Nontender without masses or hepatosplenomegaly.  Bowel sounds are present.  No CVA or flank tenderness.  Extremities:  No edema.  No calf tenderness Skin:  No rash present.   ED Course  Procedures   none   Labs Reviewed  POCT RAPID STREP A (OFFICE) negative  POCT INFLUENZA A/B negative      1. Acute upper respiratory infections of unspecified site   2. Costochondritis, acute       MDM  There is no evidence of bacterial infection today.  Prescription written for Benzonatate [facility]) to take at bedtime for night-time cough.  Take plain Mucinex (guaifenesin) twice daily for cough and congestion.  Increase fluid intake, rest. Stop all antihistamines for now, and other non-prescription cough/cold preparations. May take Ibuprofen 200mg , 4 tabs every 8 hours with food for chest/sternum discomfort. Begin Doxycycline if not improving about 1 week or if persistent fever develops (Given a prescription to hold, with an expiration date)  Follow-up with family doctor if not improving10 days.  Because patient was briefly exposed to several relatives who had influenza, advised her to call us if she develops sudden onset flu symptoms (myalgias, fever, chills/sweats, fever, headache, cough, etc).  Will call in Rx for Tamiflu       Lattie Haw, MD 02/26/12 [phone]

## 2012-02-28 ENCOUNTER — Telehealth: Payer: Self-pay | Admitting: Emergency Medicine

## 2013-01-26 ENCOUNTER — Emergency Department
Admission: EM | Admit: 2013-01-26 | Discharge: 2013-01-26 | Disposition: A | Discharge status: Home / Self Care | Payer: Managed Care, Other (non HMO) | Source: Home / Self Care | Attending: Emergency Medicine | Admitting: Emergency Medicine

## 2013-01-26 ENCOUNTER — Encounter: Payer: Self-pay | Admitting: Emergency Medicine

## 2013-01-26 DIAGNOSIS — S335XXA Sprain of ligaments of lumbar spine, initial encounter: Secondary | ICD-10-CM

## 2013-01-26 DIAGNOSIS — S39012A Strain of muscle, fascia and tendon of lower back, initial encounter: Secondary | ICD-10-CM

## 2013-01-26 MED ORDER — MELOXICAM 7.5 MG PO TABS: 7.5000 mg | ORAL_TABLET | Freq: Two times a day (BID) | ORAL | Status: DC | PRN

## 2013-01-26 MED ORDER — CYCLOBENZAPRINE HCL 5 MG PO TABS: ORAL_TABLET | ORAL | Status: DC

## 2013-01-26 NOTE — ED Provider Notes (Signed)
CSN: [id number]     Arrival date & time 01/26/13  1420 History   First MD Initiated Contact with Patient 01/26/13 1503     Chief Complaint  Patient presents with  . Back Pain   (Consider location/radiation/quality/duration/timing/severity/associated sxs/prior Treatment) Patient is a 27 y.o. female presenting with back pain. The history is provided by the patient (And her partner).  Back Pain Ardel c/o low back pain intermittent x 1 month since feeling a pop while vacuuming.--It seems to get better for a few days at a time and she can reinjure it with bending and lifting. Pain is low and dull, 2/10 in intensity, at rest ,but sharp and 10/10 with movements.  Can radiate to the right buttock but not beyond. No leg symptoms. No bowel or bladder dysfunction. Has not tried any pain medication. No weakness or paresthesias.   Past Medical History  Diagnosis Date  . Hypertension   . PCOS (polycystic ovarian syndrome)    Past Surgical History  Procedure Laterality Date  . Tonsillectomy    . Foot surgery      right   Family History  Problem Relation Age of Onset  . Diabetes Mother   . Polycystic ovary syndrome Mother   . Hypertension Father   . Diabetes Father    History  Substance Use Topics  . Smoking status: Never Smoker   . Smokeless tobacco: Never Used  . Alcohol Use: Yes   OB History   Grav Para Term Preterm Abortions TAB SAB Ect Mult Living                 Review of Systems  Musculoskeletal: Positive for back pain.  All other systems reviewed and are negative.    Allergies  Amoxicillin; Cefaclor; Enalapril maleate; Erythromycin; Metformin; Penicillins; and Sulfonamide derivatives  Home Medications   Current Outpatient Rx  Name  Route  Sig  Dispense  Refill  . cyclobenzaprine (FLEXERIL) 5 MG tablet      Take 1 or 2 every 8 hours as needed for muscle relaxant. Caution: May cause drowsiness.   20 tablet   0   . fluticasone (FLONASE) 50 MCG/ACT nasal spray       One spray in each nostril twice a day, use left hand for right nostril, and right hand for left nostril.   48 g   3   . L-Lysine 500 MG CAPS   Oral   Take by mouth.           . meloxicam (MOBIC) 7.5 MG tablet   Oral   Take 1 tablet (7.5 mg total) by mouth 2 (two) times daily as needed for pain. Take with food. Don't take at same time with any other NSAID.   20 tablet   0    BP 139/102  Pulse 97  Temp(Src) 98.5 F (36.9 C) (Oral)  Resp 16  Ht 5\' 9"  (1.753 m)  Wt 335 lb (151.955 kg)  BMI 49.45 kg/m2  SpO2 97%  LMP 01/12/2013 Physical Exam  Nursing note and vitals reviewed. Constitutional: She is oriented to person, place, and time. She appears well-developed and well-nourished. She is cooperative.  Non-toxic appearance. She appears distressed (Appears mildly uncomfortable from low back pain.).  HENT:  Head: Normocephalic and atraumatic.  Mouth/Throat: Oropharynx is clear and moist.  Eyes: EOM are normal. Pupils are equal, round, and reactive to light. No scleral icterus.  Neck: Neck supple.  Cardiovascular: Regular rhythm and normal heart sounds.   Pulmonary/Chest: Effort  normal and breath sounds normal. No respiratory distress. She has no wheezes. She has no rales. She exhibits no tenderness.  Abdominal: Soft. There is no tenderness.  Musculoskeletal:       Right hip: Normal.       Left hip: Normal.       Cervical back: She exhibits no tenderness.       Thoracic back: She exhibits no tenderness.       Lumbar back: She exhibits decreased range of motion, tenderness (right paralumbar) and spasm (right paralumbar). She exhibits no bony tenderness, no swelling, no edema, no deformity, no laceration and normal pulse.  Negative Right straight leg-raise test. Negative Left straight leg-raise test.  Negative Right Luisa Hart test. Negative Left Luisa Hart test.    Neurological: She is alert and oriented to person, place, and time. She has normal strength. She displays no  atrophy, no tremor and normal reflexes. No cranial nerve deficit or sensory deficit. She exhibits normal muscle tone. Gait normal.  Reflex Scores:      Patellar reflexes are 2+ on the right side and 2+ on the left side.      Achilles reflexes are 2+ on the right side and 2+ on the left side. Skin: Skin is warm, dry and intact. No lesion and no rash noted.  Psychiatric: She has a normal mood and affect.    ED Course  Procedures (including critical care time) Labs Review Labs Reviewed - No data to display Imaging Review No results found.  EKG Interpretation    Date/Time:    Ventricular Rate:    PR Interval:    QRS Duration:   QT Interval:    QTC Calculation:   R Axis:     Text Interpretation:              MDM   1. Lumbar strain, initial encounter    Likely musculoligamentous back strain. No neurologic deficit. No signs or symptoms that this is discogenic. We discussed options, and she declined any imaging at this time. We'll treat conservatively. Avoid heavy lifting for the next couple weeks, and acute and long-term treatment for low back pain discussed. She is overweight and she needs to lose weight For acute pain, try ice x24 hours, then heat after that. Information sheet on low back care, with gentle range of motion exercises starting within a week.-As tolerated Mobic 7.5 mg twice a day when necessary pain. Flexeril when necessary muscle relaxant. Followup with PCP if not improving in one to 2 weeks, sooner if worse or new symptoms. Precautions discussed. Red flags discussed. Questions invited and answered. Patient voiced understanding and agreement.  Also advised to followup with PCP for management of elevated BP. (bp rechecked here 138/96)  Lajean Manes, MD 01/26/13 1630

## 2013-01-26 NOTE — ED Notes (Signed)
Chelsea [name] c/o low back pain intermittent x 1 month since feeling a pop while vacuuming. Pain is low and dull at rest but sharp and 10/10 with movements.

## 2013-10-18 ENCOUNTER — Other Ambulatory Visit: Payer: Self-pay

## 2013-10-18 ENCOUNTER — Ambulatory Visit (INDEPENDENT_AMBULATORY_CARE_PROVIDER_SITE_OTHER): Payer: Managed Care, Other (non HMO) | Admitting: Sports Medicine

## 2013-10-18 ENCOUNTER — Encounter: Payer: Self-pay | Admitting: Sports Medicine

## 2013-10-18 VITALS — BP 152/100 | HR 108 | Ht 69.0 in | Wt 328.0 lb

## 2013-10-18 DIAGNOSIS — M7750 Other enthesopathy of unspecified foot: Secondary | ICD-10-CM

## 2013-10-18 DIAGNOSIS — M7741 Metatarsalgia, right foot: Secondary | ICD-10-CM | POA: Insufficient documentation

## 2013-10-18 DIAGNOSIS — M2242 Chondromalacia patellae, left knee: Secondary | ICD-10-CM

## 2013-10-18 DIAGNOSIS — M7742 Metatarsalgia, left foot: Secondary | ICD-10-CM

## 2013-10-18 DIAGNOSIS — I10 Essential (primary) hypertension: Secondary | ICD-10-CM

## 2013-10-18 DIAGNOSIS — M2240 Chondromalacia patellae, unspecified knee: Secondary | ICD-10-CM

## 2013-10-18 DIAGNOSIS — E282 Polycystic ovarian syndrome: Secondary | ICD-10-CM

## 2013-10-18 MED ORDER — PHENTERMINE HCL 37.5 MG PO CAPS: 37.5000 mg | ORAL_CAPSULE | ORAL | Status: DC

## 2013-10-18 MED ORDER — VALSARTAN-HYDROCHLOROTHIAZIDE 160-25 MG PO TABS: 0.5000 | ORAL_TABLET | Freq: Every day | ORAL | Status: DC

## 2013-10-18 NOTE — Assessment & Plan Note (Signed)
Currently on metformin treated by her OB/GYN.

## 2013-10-18 NOTE — Assessment & Plan Note (Signed)
Phentermine, nutrition referral. She will come downstairs to the free exercise classes.

## 2013-10-18 NOTE — Progress Notes (Signed)
Patient ID: Chelsea [name], female   DOB: [birth year], 28 y.o.   MRN: [id number]   Subjective:    CC: Left knee pain, bilateral foot pain  HPI: Chelsea [name] is a pleasant 28 year old female with history of PCOS and osteoarthritis who presents with >1 year of left-sided pain due to patellofemoral chondromalacia and new-onset bilateral foot pain. States that both knees have been painful in the past; however, the pain in her right knee has been minimal over the past year. Pain in her left knee is localized to the lateral aspect and is worse with movement/after exertion. States that she was previously walking daily and had lost 21 pounds, but she is now unable to exercise due to the pain. Has tried Mobic, Aleve, and Advil with no relief. Also reports a deep, burning sensation on the bottom of her foot that is worse with walking. Also states that she is interested in pursuing a weight-loss supplement today.  Past medical history, Surgical history, Family history not pertinant except as noted below, Social history, Allergies, and medications have been entered into the medical record, reviewed, and no changes needed.   Review of Systems: No fevers, chills, night sweats, weight loss, chest pain, or shortness of breath.   Objective:    General: Well Developed, well nourished, and in no acute distress.  Neuro: Alert and oriented x3, extra-ocular muscles intact, sensation grossly intact. HEENT: Normocephalic, atraumatic, pupils equal round reactive to light, neck supple, no masses, no lymphadenopathy, thyroid nonpalpable.  Skin: Warm and dry, no rashes. No visible lesions over the feet or knee. Callous noted over the second through fourth toes over the metatarsal heads. Cardiac: Regular rate and rhythm, no murmurs rubs or gallops, no lower extremity edema.  Respiratory: Clear to auscultation bilaterally. Not using accessory muscles, speaking in full sentences.  Left Knee: Normal to inspection with no  erythema or effusion or obvious bony abnormalities. Palpation normal with no warmth, patellar tenderness, or condyle tenderness. Tenderness to palpation along the lateral joint line. ROM full in flexion and extension and lower leg rotation. Ligaments with solid consistent endpoints including ACL, PCL, LCL, MCL. Negative Mcmurray's, Apley's, and Thessalonian tests. Non painful patellar compression. Patellar glide with some crepitus. Patellar and quadriceps tendons unremarkable. Hamstring and quadriceps strength is normal.  Some pain with varus stress. Left Foot: No visible erythema or swelling. Range of motion is full in all directions. Strength is 5/5 in all directions. No hallux valgus. No pes cavus or pes planus. Callus over the second through fourth toes over the metatarsals. No pain over the navicular prominence, or base of fifth metatarsal. No tenderness to palpation of the calcaneal insertion of plantar fascia. No pain at the Achilles insertion. No pain over the calcaneal bursa. No pain of the retrocalcaneal bursa. No tenderness to palpation over the tarsals, metatarsals, or phalanges. No hallux rigidus or limitus. No tenderness palpation over interphalangeal joints. No pain with compression of the metatarsal heads. Neurovascularly intact distally. Right Foot: No visible erythema or swelling. Range of motion is full in all directions. Strength is 5/5 in all directions. No hallux valgus. No pes cavus or pes planus. Callus over the second through fourth toes over the metatarsal. No pain over the navicular prominence, or base of fifth metatarsal. No tenderness to palpation of the calcaneal insertion of plantar fascia. No pain at the Achilles insertion. No pain over the calcaneal bursa. No pain of the retrocalcaneal bursa. No tenderness to palpation over the tarsals, metatarsals, or phalanges. No  hallux rigidus or limitus. No tenderness palpation over interphalangeal  joints. No pain with compression of the metatarsal heads. Neurovascularly intact distally.  Procedure: Real-time Ultrasound Guided Injection of left knee Device: GE Logiq E  Verbal informed consent obtained.  Time-out conducted.  Noted no overlying erythema, induration, or other signs of local infection.  Skin prepped in a sterile fashion.  Local anesthesia: Topical Ethyl chloride.  With sterile technique and under real time ultrasound guidance:  2 cc kenalog 40, 4 cc lidocaine injected easily. Completed without difficulty  Pain immediately resolved suggesting accurate placement of the medication.  Advised to call if fevers/chills, erythema, induration, drainage, or persistent bleeding.  Images permanently stored and available for review in the ultrasound unit.  Impression: Technically successful ultrasound guided injection. Impression and Recommendations:   1. Patellofemoral chondromalacia: Persistent and worsening pain of the left knee that is worse with varus stress and exercise is suggestive of acute worsening of previously-diagnosed patellofemoral chondromalacia. Has failed management with OTC medications. - Joint injection today - Referral to PT  2. Bilateral metatarsalgia: Pain with standing/walking paired with callosity under the metatarsal heads is suggestive of metatarsalgia. Location under the second through fourth metatarsals suggests that the arch has collapsed. - Return for custom orthotics with metatarsal pads  3. Obesity: Patient reports that she has lost 21 pounds; however, her weight loss has plateaued. Also reports that she is attempting a pregnancy, but will not conceive until she reaches a weight of 250 lbs or less. - Phentermine - Referral for nutrition counseling  4. Hypertension: Blood pressure has been persistently high during previous visits and is elevated at today's visit. - Start Valsartan-Hydrochlorothiazide   5. PCOS: - Start metformin

## 2013-10-18 NOTE — Assessment & Plan Note (Signed)
Starting Diovan HCT

## 2013-10-18 NOTE — Assessment & Plan Note (Signed)
Ultrasound guided injection, formal physical therapy.

## 2013-10-18 NOTE — Assessment & Plan Note (Signed)
Return for custom orthotics with metatarsal pads 

## 2013-10-20 ENCOUNTER — Telehealth: Payer: Self-pay

## 2013-10-20 NOTE — Telephone Encounter (Signed)
Patient noticed pain in her tongue and white patches. She is sure she has thrush. She wanted to know if we could call her in something for the thrush. She also wants to know if this is a side effect of either Phentermine or Diovan because she recently start both medications.

## 2013-10-21 MED ORDER — HYDROCHLOROTHIAZIDE 25 MG PO TABS: 25.0000 mg | ORAL_TABLET | Freq: Every day | ORAL | Status: DC

## 2013-10-21 MED ORDER — NYSTATIN 100000 UNIT/ML MT SUSP: 500000.0000 [IU] | Freq: Four times a day (QID) | OROMUCOSAL | Status: DC

## 2013-10-21 NOTE — Telephone Encounter (Signed)
Left message for patient to call back  

## 2013-10-21 NOTE — Telephone Encounter (Signed)
Okay to discontinue Diovan, I will switch her to hydrochlorothiazide

## 2013-10-21 NOTE — Telephone Encounter (Signed)
Patient aware. Chelsea [name], CMA 

## 2013-10-21 NOTE — Telephone Encounter (Signed)
Kirsta reports she also had swelling and redness in her face. She states she forgot to mention this yesterday. So she stopped the Diovan 2 days ago and her symptoms have resolved. I tried to schedule her for an office visit to discuss blood pressure medication. She has an orthotics appointment next week. She has lost her job and did not want to schedule a separate appointment. Please advise.

## 2013-10-21 NOTE — Telephone Encounter (Signed)
Neither probably if its thrush, without seeing it I can't confirm this is the correct diagnosis but I have called in nystatin swish as requested.

## 2013-10-26 ENCOUNTER — Encounter: Payer: Self-pay | Admitting: Sports Medicine

## 2013-10-26 ENCOUNTER — Ambulatory Visit (INDEPENDENT_AMBULATORY_CARE_PROVIDER_SITE_OTHER): Payer: Managed Care, Other (non HMO) | Admitting: Sports Medicine

## 2013-10-26 VITALS — BP 166/92 | HR 80 | Ht 69.0 in | Wt 317.0 lb

## 2013-10-26 DIAGNOSIS — M7750 Other enthesopathy of unspecified foot: Secondary | ICD-10-CM

## 2013-10-26 DIAGNOSIS — M7741 Metatarsalgia, right foot: Secondary | ICD-10-CM

## 2013-10-26 DIAGNOSIS — I10 Essential (primary) hypertension: Secondary | ICD-10-CM

## 2013-10-26 DIAGNOSIS — M7742 Metatarsalgia, left foot: Principal | ICD-10-CM

## 2013-10-26 NOTE — Progress Notes (Signed)

## 2013-10-26 NOTE — Assessment & Plan Note (Signed)
Custom orthotics as above. 

## 2013-10-26 NOTE — Assessment & Plan Note (Signed)
Mild allergic reaction to angiotensin receptor blocker. Has not yet started hydrochlorothiazide, start HCTZ alone and return in 3 weeks to recheck blood pressure.

## 2013-10-26 NOTE — Assessment & Plan Note (Signed)
Continue phentermine, 11 pound weight loss in one week, recheck in 3 weeks.

## 2013-10-28 ENCOUNTER — Ambulatory Visit (INDEPENDENT_AMBULATORY_CARE_PROVIDER_SITE_OTHER): Payer: Managed Care, Other (non HMO) | Admitting: Physical Therapy

## 2013-10-28 DIAGNOSIS — M2240 Chondromalacia patellae, unspecified knee: Secondary | ICD-10-CM

## 2013-10-28 DIAGNOSIS — R262 Difficulty in walking, not elsewhere classified: Secondary | ICD-10-CM

## 2013-10-28 DIAGNOSIS — M6281 Muscle weakness (generalized): Secondary | ICD-10-CM

## 2013-10-28 DIAGNOSIS — M25569 Pain in unspecified knee: Secondary | ICD-10-CM

## 2013-11-02 ENCOUNTER — Telehealth: Payer: Self-pay | Admitting: *Deleted

## 2013-11-02 NOTE — Telephone Encounter (Signed)
Dose was probably a bit too high and since it is a diuretic, start again at one half tab daily. Drink some Gatorade.

## 2013-11-02 NOTE — Telephone Encounter (Signed)
Chelsea [name] called that she was having a reaction to the HCTZ. She states that since starting med she is had a sudden onset of dizziness accompanied by nausea that is worsening and lasting longer everyday. She said she stopped the medication and symptoms are slowly improving. Please advise. Corliss Skains, CMA

## 2013-11-02 NOTE — Telephone Encounter (Signed)
Patient notified. Cade Dashner, CMA 

## 2013-11-09 ENCOUNTER — Encounter (INDEPENDENT_AMBULATORY_CARE_PROVIDER_SITE_OTHER): Payer: Managed Care, Other (non HMO) | Admitting: Physical Therapy

## 2013-11-09 DIAGNOSIS — M2240 Chondromalacia patellae, unspecified knee: Secondary | ICD-10-CM

## 2013-11-09 DIAGNOSIS — M25569 Pain in unspecified knee: Secondary | ICD-10-CM

## 2013-11-09 DIAGNOSIS — R262 Difficulty in walking, not elsewhere classified: Secondary | ICD-10-CM

## 2013-11-09 DIAGNOSIS — M6281 Muscle weakness (generalized): Secondary | ICD-10-CM

## 2013-11-15 ENCOUNTER — Ambulatory Visit (INDEPENDENT_AMBULATORY_CARE_PROVIDER_SITE_OTHER): Payer: Managed Care, Other (non HMO) | Admitting: Sports Medicine

## 2013-11-15 ENCOUNTER — Encounter: Payer: Self-pay | Admitting: Sports Medicine

## 2013-11-15 VITALS — BP 155/98 | HR 92 | Ht 69.0 in | Wt 312.0 lb

## 2013-11-15 DIAGNOSIS — M2240 Chondromalacia patellae, unspecified knee: Secondary | ICD-10-CM

## 2013-11-15 DIAGNOSIS — R300 Dysuria: Secondary | ICD-10-CM

## 2013-11-15 DIAGNOSIS — I10 Essential (primary) hypertension: Secondary | ICD-10-CM

## 2013-11-15 DIAGNOSIS — M2242 Chondromalacia patellae, left knee: Secondary | ICD-10-CM

## 2013-11-15 MED ORDER — HYDROCHLOROTHIAZIDE 25 MG PO TABS: 25.0000 mg | ORAL_TABLET | Freq: Every day | ORAL | Status: DC

## 2013-11-15 MED ORDER — DICLOFENAC SODIUM 75 MG PO TBEC: 75.0000 mg | DELAYED_RELEASE_TABLET | Freq: Two times a day (BID) | ORAL | Status: DC

## 2013-11-15 MED ORDER — PHENTERMINE HCL 37.5 MG PO CAPS: 37.5000 mg | ORAL_CAPSULE | ORAL | Status: DC

## 2013-11-15 NOTE — Assessment & Plan Note (Signed)
Persistent pain, continue physical therapy. Injection helped. Adding Voltaren.

## 2013-11-15 NOTE — Assessment & Plan Note (Signed)
UA

## 2013-11-15 NOTE — Progress Notes (Signed)
  Subjective:    CC: Followup.  HPI: Obesity: Continued weight loss on phentermine.  Hypertension: Improved significantly with one half tab of hydrochlorothiazide.  Dysuria: Present briefly, no constitutional symptoms.  Knee pain: Improved after injection, having a slight recurrence of pain, not on any oral NSAIDs.  Past medical history, Surgical history, Family history not pertinant except as noted below, Social history, Allergies, and medications have been entered into the medical record, reviewed, and no changes needed.   Review of Systems: No fevers, chills, night sweats, weight loss, chest pain, or shortness of breath.   Objective:    General: Well Developed, well nourished, and in no acute distress.  Neuro: Alert and oriented x3, extra-ocular muscles intact, sensation grossly intact.  HEENT: Normocephalic, atraumatic, pupils equal round reactive to light, neck supple, no masses, no lymphadenopathy, thyroid nonpalpable.  Skin: Warm and dry, no rashes. Cardiac: Regular rate and rhythm, no murmurs rubs or gallops, no lower extremity edema.  Respiratory: Clear to auscultation bilaterally. Not using accessory muscles, speaking in full sentences.  Impression and Recommendations:

## 2013-11-15 NOTE — Assessment & Plan Note (Signed)
Improved but still elevated, Increase to one full tablet of hydrochlorothiazide. If persistently elevated in one month we will add amlodipine.

## 2013-11-15 NOTE — Assessment & Plan Note (Signed)
Refilling phentermine. Continue diet.

## 2013-11-16 LAB — URINALYSIS
Bilirubin Urine: NEGATIVE
Glucose, UA: NEGATIVE mg/dL
Hgb urine dipstick: NEGATIVE
Ketones, ur: NEGATIVE mg/dL
Nitrite: NEGATIVE
Protein, ur: NEGATIVE mg/dL
Specific Gravity, Urine: 1.021 (ref 1.005–1.030)
Urobilinogen, UA: 0.2 mg/dL (ref 0.0–1.0)
pH: 5.5 (ref 5.0–8.0)

## 2013-11-16 MED ORDER — NITROFURANTOIN MONOHYD MACRO 100 MG PO CAPS: 100.0000 mg | ORAL_CAPSULE | Freq: Two times a day (BID) | ORAL | Status: DC

## 2013-11-16 NOTE — Addendum Note (Signed)
Addended by: Monica Becton on: 11/16/2013 01:46 PM   Modules accepted: Orders

## 2013-11-17 ENCOUNTER — Encounter (INDEPENDENT_AMBULATORY_CARE_PROVIDER_SITE_OTHER): Payer: Managed Care, Other (non HMO) | Admitting: Physical Therapy

## 2013-11-17 ENCOUNTER — Other Ambulatory Visit: Payer: Self-pay | Admitting: Sports Medicine

## 2013-11-17 DIAGNOSIS — M6281 Muscle weakness (generalized): Secondary | ICD-10-CM

## 2013-11-17 DIAGNOSIS — M25569 Pain in unspecified knee: Secondary | ICD-10-CM

## 2013-11-17 DIAGNOSIS — M2240 Chondromalacia patellae, unspecified knee: Secondary | ICD-10-CM

## 2013-11-17 DIAGNOSIS — R300 Dysuria: Secondary | ICD-10-CM

## 2013-11-17 DIAGNOSIS — R262 Difficulty in walking, not elsewhere classified: Secondary | ICD-10-CM

## 2013-11-20 LAB — URINE CULTURE: Colony Count: 30000

## 2013-11-22 ENCOUNTER — Encounter (INDEPENDENT_AMBULATORY_CARE_PROVIDER_SITE_OTHER): Payer: Managed Care, Other (non HMO) | Admitting: Physical Therapy

## 2013-11-22 DIAGNOSIS — M6281 Muscle weakness (generalized): Secondary | ICD-10-CM

## 2013-11-22 DIAGNOSIS — M25569 Pain in unspecified knee: Secondary | ICD-10-CM

## 2013-11-22 DIAGNOSIS — R262 Difficulty in walking, not elsewhere classified: Secondary | ICD-10-CM

## 2013-11-22 DIAGNOSIS — M2240 Chondromalacia patellae, unspecified knee: Secondary | ICD-10-CM

## 2013-11-23 LAB — BASIC METABOLIC PANEL WITH GFR
Calcium: 9.9 mg/dL (ref 8.4–10.5)
Sodium: 141 meq/L (ref 135–145)

## 2013-11-23 LAB — BASIC METABOLIC PANEL
BUN: 10 mg/dL (ref 6–23)
CO2: 30 mEq/L (ref 19–32)
Chloride: 102 mEq/L (ref 96–112)
Creat: 0.96 mg/dL (ref 0.50–1.10)
Glucose, Bld: 85 mg/dL (ref 70–99)
Potassium: 4 mEq/L (ref 3.5–5.3)

## 2013-11-24 ENCOUNTER — Encounter: Payer: Managed Care, Other (non HMO) | Admitting: Physical Therapy

## 2013-12-09 ENCOUNTER — Other Ambulatory Visit: Payer: Self-pay | Admitting: Sports Medicine

## 2013-12-13 ENCOUNTER — Encounter: Payer: Self-pay | Admitting: Sports Medicine

## 2013-12-13 ENCOUNTER — Ambulatory Visit (INDEPENDENT_AMBULATORY_CARE_PROVIDER_SITE_OTHER): Payer: BC Managed Care – PPO

## 2013-12-13 ENCOUNTER — Ambulatory Visit (INDEPENDENT_AMBULATORY_CARE_PROVIDER_SITE_OTHER): Payer: BC Managed Care – PPO | Admitting: Sports Medicine

## 2013-12-13 VITALS — BP 165/89 | HR 91 | Ht 69.0 in | Wt 298.0 lb

## 2013-12-13 DIAGNOSIS — M6249 Contracture of muscle, multiple sites: Secondary | ICD-10-CM

## 2013-12-13 DIAGNOSIS — M62838 Other muscle spasm: Secondary | ICD-10-CM | POA: Insufficient documentation

## 2013-12-13 DIAGNOSIS — M25511 Pain in right shoulder: Secondary | ICD-10-CM

## 2013-12-13 DIAGNOSIS — M25551 Pain in right hip: Secondary | ICD-10-CM

## 2013-12-13 DIAGNOSIS — I10 Essential (primary) hypertension: Secondary | ICD-10-CM | POA: Diagnosis not present

## 2013-12-13 MED ORDER — PHENTERMINE HCL 37.5 MG PO CAPS: 37.5000 mg | ORAL_CAPSULE | ORAL | Status: DC

## 2013-12-13 MED ORDER — CYCLOBENZAPRINE HCL 10 MG PO TABS: ORAL_TABLET | ORAL | Status: DC

## 2013-12-13 NOTE — Assessment & Plan Note (Signed)
15 pound weight loss.  Refilling phentermine.

## 2013-12-13 NOTE — Progress Notes (Signed)
  Subjective:    CC: Followup  HPI: Obesity: Excellent weight loss on phentermine.  Muscle aches: Right trapezial, neck to shoulder, no radicular symptoms into the hand. No constitutional symptoms, no bowel or bladder dysfunction.  Right hip pain: Localized in the right groin, worse with weightbearing, moderate, persistent with gelling.  Past medical history, Surgical history, Family history not pertinant except as noted below, Social history, Allergies, and medications have been entered into the medical record, reviewed, and no changes needed.   Review of Systems: No fevers, chills, night sweats, weight loss, chest pain, or shortness of breath.   Objective:    General: Well Developed, well nourished, and in no acute distress.  Neuro: Alert and oriented x3, extra-ocular muscles intact, sensation grossly intact.  HEENT: Normocephalic, atraumatic, pupils equal round reactive to light, neck supple, no masses, no lymphadenopathy, thyroid nonpalpable.  Skin: Warm and dry, no rashes. Cardiac: Regular rate and rhythm, no murmurs rubs or gallops, no lower extremity edema.  Respiratory: Clear to auscultation bilaterally. Not using accessory muscles, speaking in full sentences. Neck: Negative spurling's, tender to palpation over the right trapezius Full neck range of motion Grip strength and sensation normal in bilateral hands Strength good C4 to T1 distribution No sensory change to C4 to T1 Reflexes normal Right Hip: ROM IR: 60 Deg, ER: 60 Deg, Flexion: 120 Deg, Extension: 100 Deg, Abduction: 45 Deg, Adduction: 45 Deg, reproduction of pain with internal rotation. Strength IR: 5/5, ER: 5/5, Flexion: 5/5, Extension: 5/5, Abduction: 5/5, Adduction: 5/5 Pelvic alignment unremarkable to inspection and palpation. Standing hip rotation and gait without trendelenburg / unsteadiness. Greater trochanter without tenderness to palpation. No tenderness over piriformis. No SI joint tenderness and  normal minimal SI movement.  Procedure: Real-time Ultrasound Guided Injection of right femoral acetabular joint Device: GE Logiq E  Verbal informed consent obtained.  Time-out conducted.  Noted no overlying erythema, induration, or other signs of local infection.  Skin prepped in a sterile fashion.  Local anesthesia: Topical Ethyl chloride.  With sterile technique and under real time ultrasound guidance:  Spinal needle advanced into the femoral head/neck junction, 2 cc Kenalog 40, 4 cc lidocaine injected easily. Completed without difficulty  Pain immediately resolved suggesting accurate placement of the medication.  Advised to call if fevers/chills, erythema, induration, drainage, or persistent bleeding.  Images permanently stored and available for review in the ultrasound unit.  Impression: Technically successful ultrasound guided injection.  Impression and Recommendations:

## 2013-12-13 NOTE — Assessment & Plan Note (Signed)
X-rays, Flexeril, rehabilitation exercises.

## 2013-12-13 NOTE — Assessment & Plan Note (Signed)
Femoral acetabular joint injection. X-rays. Return in one month. Workup for rheumatoid arthritis.

## 2013-12-13 NOTE — Assessment & Plan Note (Signed)
Elevated but in significant pain. No changes however if still elevated once pain-free we can add amlodipine.

## 2013-12-14 LAB — HLA-B27 ANTIGEN: DNA Result:: NOT DETECTED

## 2013-12-14 LAB — COMPREHENSIVE METABOLIC PANEL
ALT: 77 U/L — ABNORMAL HIGH (ref 0–35)
AST: 29 U/L (ref 0–37)
Albumin: 4.5 g/dL (ref 3.5–5.2)
CO2: 28 mEq/L (ref 19–32)
Calcium: 9.7 mg/dL (ref 8.4–10.5)
Chloride: 102 mEq/L (ref 96–112)
Creat: 0.93 mg/dL (ref 0.50–1.10)
Potassium: 4.3 mEq/L (ref 3.5–5.3)

## 2013-12-14 LAB — COMPREHENSIVE METABOLIC PANEL WITH GFR
Alkaline Phosphatase: 89 U/L (ref 39–117)
BUN: 16 mg/dL (ref 6–23)
Glucose, Bld: 84 mg/dL (ref 70–99)
Sodium: 142 meq/L (ref 135–145)
Total Bilirubin: 0.4 mg/dL (ref 0.2–1.2)
Total Protein: 7.4 g/dL (ref 6.0–8.3)

## 2013-12-14 LAB — CBC WITH DIFFERENTIAL/PLATELET
Basophils Absolute: 0 10*3/uL (ref 0.0–0.1)
Basophils Relative: 0 % (ref 0–1)
Eosinophils Absolute: 0.1 K/uL (ref 0.0–0.7)
Eosinophils Relative: 1 % (ref 0–5)
HCT: 43.3 % (ref 36.0–46.0)
Hemoglobin: 14.1 g/dL (ref 12.0–15.0)
Lymphocytes Relative: 27 % (ref 12–46)
Lymphs Abs: 3.1 10*3/uL (ref 0.7–4.0)
MCH: 25.7 pg — ABNORMAL LOW (ref 26.0–34.0)
MCHC: 32.6 g/dL (ref 30.0–36.0)
MCV: 78.9 fL (ref 78.0–100.0)
Monocytes Absolute: 0.6 10*3/uL (ref 0.1–1.0)
Monocytes Relative: 5 % (ref 3–12)
Neutro Abs: 7.7 10*3/uL (ref 1.7–7.7)
Neutrophils Relative %: 67 % (ref 43–77)
Platelets: 212 10*3/uL (ref 150–400)
RBC: 5.49 MIL/uL — ABNORMAL HIGH (ref 3.87–5.11)
RDW: 15.6 % — ABNORMAL HIGH (ref 11.5–15.5)
WBC: 11.5 10*3/uL — ABNORMAL HIGH (ref 4.0–10.5)

## 2013-12-14 LAB — CK: Total CK: 23 U/L (ref 7–177)

## 2013-12-14 LAB — RHEUMATOID FACTOR: Rheumatoid fact SerPl-aCnc: <10 [IU]/mL (ref <=14)

## 2013-12-14 LAB — SEDIMENTATION RATE: Sed Rate: 19 mm/hr (ref 0–22)

## 2013-12-14 LAB — CYCLIC CITRUL PEPTIDE ANTIBODY, IGG: Cyclic Citrullin Peptide Ab: <2 U/mL (ref 0.0–5.0)

## 2013-12-14 LAB — B. BURGDORFI ANTIBODIES: B burgdorferi Ab IgG+IgM: 0.47 {ISR}

## 2013-12-14 LAB — URIC ACID: Uric Acid, Serum: 7.7 mg/dL — ABNORMAL HIGH (ref 2.4–7.0)

## 2013-12-14 LAB — ANA: Anti Nuclear Antibody(ANA): NEGATIVE

## 2013-12-15 LAB — EHRLICHIA ANTIBODY PANEL
E chaffeensis (HGE) Ab, IgG: <1:64 {titer}
E chaffeensis (HGE) Ab, IgM: <1:20 {titer}

## 2013-12-17 LAB — ROCKY MTN SPOTTED FVR ABS PNL(IGG+IGM)
RMSF IgG: 0.1 IV
RMSF IgM: 0.1 IV

## 2013-12-21 ENCOUNTER — Encounter: Payer: Self-pay | Admitting: Sports Medicine

## 2013-12-21 ENCOUNTER — Ambulatory Visit (INDEPENDENT_AMBULATORY_CARE_PROVIDER_SITE_OTHER): Payer: BC Managed Care – PPO | Admitting: Sports Medicine

## 2013-12-21 VITALS — BP 155/106 | HR 118 | Temp 97.4°F | Wt 293.0 lb

## 2013-12-21 DIAGNOSIS — R300 Dysuria: Secondary | ICD-10-CM | POA: Diagnosis not present

## 2013-12-21 DIAGNOSIS — M25551 Pain in right hip: Secondary | ICD-10-CM

## 2013-12-21 DIAGNOSIS — I10 Essential (primary) hypertension: Secondary | ICD-10-CM | POA: Diagnosis not present

## 2013-12-21 DIAGNOSIS — N3090 Cystitis, unspecified without hematuria: Secondary | ICD-10-CM | POA: Diagnosis not present

## 2013-12-21 LAB — POCT URINALYSIS DIPSTICK
Bilirubin, UA: NEGATIVE
Blood, UA: NEGATIVE
Glucose, UA: NEGATIVE
Ketones, UA: NEGATIVE
Nitrite, UA: NEGATIVE
Protein, UA: NEGATIVE
Spec Grav, UA: 1.015
Urobilinogen, UA: 0.2
pH, UA: 5.5

## 2013-12-21 MED ORDER — NITROFURANTOIN MONOHYD MACRO 100 MG PO CAPS: 100.0000 mg | ORAL_CAPSULE | Freq: Two times a day (BID) | ORAL | Status: DC

## 2013-12-21 MED ORDER — AMLODIPINE BESYLATE 10 MG PO TABS: 5.0000 mg | ORAL_TABLET | Freq: Every day | ORAL | Status: DC

## 2013-12-21 NOTE — Assessment & Plan Note (Signed)
Resolved after femoral acetabular joint injection.

## 2013-12-21 NOTE — Assessment & Plan Note (Signed)
Insufficient control on hydrochlorothiazide. Adding amlodipine. Return in 3 weeks at weight check to recheck.

## 2013-12-21 NOTE — Assessment & Plan Note (Signed)
Recurrent, previous infection was approximately a month and a half ago. It was Escherichia coli. Macrobid for 14 days. Discussed preventative measures. Return if no better. Culture.

## 2013-12-21 NOTE — Progress Notes (Signed)
  Subjective:    CC: Dysuria  HPI: For the past several days this pleasant 28 year old female has had increasing burning during urination, frequency, urgency without constitutional symptoms, and without flank pain. Symptoms are moderate, persistent, this is her second onset of these symptoms this year. On further questioning she does wipe front to back, and during vaginal and anal intercourse uses different instruments with her female partner.  Hypertension: Persistently elevated despite hydrochlorothiazide 25 mg. She did have angioedema with ACE inhibitor's.  Past medical history, Surgical history, Family history not pertinant except as noted below, Social history, Allergies, and medications have been entered into the medical record, reviewed, and no changes needed.   Review of Systems: No fevers, chills, night sweats, weight loss, chest pain, or shortness of breath.   Objective:    General: Well Developed, well nourished, and in no acute distress.  Neuro: Alert and oriented x3, extra-ocular muscles intact, sensation grossly intact.  HEENT: Normocephalic, atraumatic, pupils equal round reactive to light, neck supple, no masses, no lymphadenopathy, thyroid nonpalpable.  Skin: Warm and dry, no rashes. Cardiac: Regular rate and rhythm, no murmurs rubs or gallops, no lower extremity edema.  Respiratory: Clear to auscultation bilaterally. Not using accessory muscles, speaking in full sentences. Abdomen: Soft, nontender, nondistended, normal bowel sounds, no palpable masses, no guarding, no rigidity, no rebound tenderness, no costovertebral angle pain, only mild tenderness over the suprapubic region.  Urinalysis shows trace leukocytes.  Impression and Recommendations:

## 2013-12-23 LAB — URINE CULTURE

## 2014-01-10 ENCOUNTER — Ambulatory Visit (INDEPENDENT_AMBULATORY_CARE_PROVIDER_SITE_OTHER): Payer: BC Managed Care – PPO | Admitting: Sports Medicine

## 2014-01-10 ENCOUNTER — Encounter: Payer: Self-pay | Admitting: Sports Medicine

## 2014-01-10 VITALS — BP 149/81 | HR 86 | Ht 69.0 in | Wt 290.0 lb

## 2014-01-10 DIAGNOSIS — E282 Polycystic ovarian syndrome: Secondary | ICD-10-CM

## 2014-01-10 DIAGNOSIS — M6249 Contracture of muscle, multiple sites: Secondary | ICD-10-CM

## 2014-01-10 DIAGNOSIS — N3090 Cystitis, unspecified without hematuria: Secondary | ICD-10-CM | POA: Diagnosis not present

## 2014-01-10 DIAGNOSIS — M62838 Other muscle spasm: Secondary | ICD-10-CM

## 2014-01-10 DIAGNOSIS — I10 Essential (primary) hypertension: Secondary | ICD-10-CM | POA: Diagnosis not present

## 2014-01-10 MED ORDER — METFORMIN HCL ER 750 MG PO TB24: 750.0000 mg | ORAL_TABLET | Freq: Two times a day (BID) | ORAL | Status: DC

## 2014-01-10 MED ORDER — PHENTERMINE HCL 37.5 MG PO CAPS: 37.5000 mg | ORAL_CAPSULE | ORAL | Status: DC

## 2014-01-10 NOTE — Assessment & Plan Note (Signed)
Clinically resolved. If it returns we can put her on a chronic suppressive regimen of antibiotics. If persistent symptoms we will send to urology for evaluation for interstitial cystitis.

## 2014-01-10 NOTE — Assessment & Plan Note (Signed)
Much improved control. Increase amlodipine to a full 10 mg.

## 2014-01-10 NOTE — Progress Notes (Signed)
  Subjective:    CC: multiple issues  HPI: Cystitis: Resolved with Macrobid.  Right-sided neck pain: Persistent, has not been any rehabilitation exercises. No radicular symptoms down to the arm.  Obesity: 3 pound weight loss on phentermine, no side effects.  Hypertension: Improved significantly with the addition of one half tab amlodipine.  PCOS: Needs a refill on metformin, wondering why she is taking Provera.  Would like me to take over care. It sounds as though she does have relatively irregular cycles at baseline as expected.  Past medical history, Surgical history, Family history not pertinant except as noted below, Social history, Allergies, and medications have been entered into the medical record, reviewed, and no changes needed.   Review of Systems: No fevers, chills, night sweats, weight loss, chest pain, or shortness of breath.   Objective:    General: Well Developed, well nourished, and in no acute distress.  Neuro: Alert and oriented x3, extra-ocular muscles intact, sensation grossly intact.  HEENT: Normocephalic, atraumatic, pupils equal round reactive to light, neck supple, no masses, no lymphadenopathy, thyroid nonpalpable.  Skin: Warm and dry, no rashes. Cardiac: Regular rate and rhythm, no murmurs rubs or gallops, no lower extremity edema.  Respiratory: Clear to auscultation bilaterally. Not using accessory muscles, speaking in full sentences. Neck: Negative spurling's Full neck range of motion Grip strength and sensation normal in bilateral hands Strength good C4 to T1 distribution No sensory change to C4 to T1 Reflexes normal  Impression and Recommendations:

## 2014-01-10 NOTE — Assessment & Plan Note (Signed)
Refill metformin. Stop Provera. Checking testosterone levels.

## 2014-01-10 NOTE — Assessment & Plan Note (Signed)
Rehabilitation exercises. If no better in one month we will get an MRI.

## 2014-01-10 NOTE — Assessment & Plan Note (Signed)
Refilling phentermine. Return in one month. 

## 2014-01-11 LAB — TESTOSTERONE, FREE, TOTAL, SHBG
Sex Hormone Binding: 28 nmol/L (ref 18–114)
Testosterone, Free: 4.7 pg/mL (ref 0.6–6.8)
Testosterone-% Free: 2 % (ref 0.4–2.4)
Testosterone: 24 ng/dL (ref 10–70)

## 2014-01-18 ENCOUNTER — Ambulatory Visit (INDEPENDENT_AMBULATORY_CARE_PROVIDER_SITE_OTHER): Payer: BC Managed Care – PPO | Admitting: Sports Medicine

## 2014-01-18 VITALS — BP 144/91 | HR 56 | Ht 69.0 in | Wt 290.0 lb

## 2014-01-18 DIAGNOSIS — L918 Other hypertrophic disorders of the skin: Secondary | ICD-10-CM | POA: Diagnosis not present

## 2014-01-18 NOTE — Progress Notes (Signed)
  Subjective:    CC:  Skin tags  HPI: Chelsea FellingCarla has a couple of skin tags on her eyelid and her flank that she would like removed. Symptoms are moderate, persistent, the right flank skin tag is friable and interferes with her brace strap.  Past medical history, Surgical history, Family history not pertinant except as noted below, Social history, Allergies, and medications have been entered into the medical record, reviewed, and no changes needed.   Review of Systems: No fevers, chills, night sweats, weight loss, chest pain, or shortness of breath.   Objective:    General: Well Developed, well nourished, and in no acute distress.  Neuro: Alert and oriented x3, extra-ocular muscles intact, sensation grossly intact.  HEENT: Normocephalic, atraumatic, pupils equal round reactive to light, neck supple, no masses, no lymphadenopathy, thyroid nonpalpable.  Skin: Warm and dry, no rashes. Small skin tag on the left upper eyelid, moderate size skin tag on the right flank. Cardiac: Regular rate and rhythm, no murmurs rubs or gallops, no lower extremity edema.  Respiratory: Clear to auscultation bilaterally. Not using accessory muscles, speaking in full sentences.  Procedure:  Removal of left eyelid and right flank skin tag(s). Risks, benefits, alternatives explained to patient. Consent obtained. Time out conducted. Noted no overlying induration or erythema at site of injection. A small amount of lidocaine with epinephrine infiltrated under the skin tag(s) for local anesthesia. Hemostat used to clamp the neck of the skin tag(s). Scalpel then used to excise the skin tag(s), and subsequent electrocautery with a Hyfrecator used to control minor bleeding. The left eyelid skin tag was simply hyfrecated. Antibiotic ointment applied. Wound dressed. Advised to return if increased redness, swelling, drainage, fevers, or chills.  Impression and Recommendations:

## 2014-01-18 NOTE — Assessment & Plan Note (Signed)
Removed skin tags from eyelids and right flank.

## 2014-01-25 ENCOUNTER — Encounter: Payer: Self-pay | Admitting: Sports Medicine

## 2014-01-25 ENCOUNTER — Ambulatory Visit (INDEPENDENT_AMBULATORY_CARE_PROVIDER_SITE_OTHER): Payer: BC Managed Care – PPO | Admitting: Sports Medicine

## 2014-01-25 VITALS — BP 142/88 | HR 71 | Ht 69.0 in | Wt 287.0 lb

## 2014-01-25 DIAGNOSIS — I8311 Varicose veins of right lower extremity with inflammation: Secondary | ICD-10-CM | POA: Diagnosis not present

## 2014-01-25 DIAGNOSIS — I8312 Varicose veins of left lower extremity with inflammation: Secondary | ICD-10-CM

## 2014-01-25 DIAGNOSIS — M6249 Contracture of muscle, multiple sites: Secondary | ICD-10-CM

## 2014-01-25 DIAGNOSIS — M62838 Other muscle spasm: Secondary | ICD-10-CM

## 2014-01-25 DIAGNOSIS — I872 Venous insufficiency (chronic) (peripheral): Secondary | ICD-10-CM | POA: Insufficient documentation

## 2014-01-25 MED ORDER — AMBULATORY NON FORMULARY MEDICATION: Status: DC

## 2014-01-25 NOTE — Progress Notes (Signed)
  Subjective:    CC: Follow-up  HPI: Bruising: Started diclofenac, has had several episodes of bruising in both lower extremity is without pain. No chest pain, shortness of breath, lower extremity swelling.  Neck pain: Improving significantly.  Past medical history, Surgical history, Family history not pertinant except as noted below, Social history, Allergies, and medications have been entered into the medical record, reviewed, and no changes needed.   Review of Systems: No fevers, chills, night sweats, weight loss, chest pain, or shortness of breath.   Objective:    General: Well Developed, well nourished, and in no acute distress.  Neuro: Alert and oriented x3, extra-ocular muscles intact, sensation grossly intact.  HEENT: Normocephalic, atraumatic, pupils equal round reactive to light, neck supple, no masses, no lymphadenopathy, thyroid nonpalpable.  Skin: Warm and dry, no rashes. There are some varicose veins and some lower extremity venous stasis dermatitis visible in both lower extremities, negative Homans sign bilaterally. Neurovascularly intact distally. Cardiac: Regular rate and rhythm, no murmurs rubs or gallops, no lower extremity edema.  Respiratory: Clear to auscultation bilaterally. Not using accessory muscles, speaking in full sentences.  Impression and Recommendations:

## 2014-01-25 NOTE — Assessment & Plan Note (Signed)
Is no bruising however there is significant evidence of venous stasis dermatitis with varicose veins. Lower extremity compression stockings.

## 2014-01-25 NOTE — Assessment & Plan Note (Addendum)
Neck pain has improved significantly.

## 2014-01-26 LAB — PROTIME-INR
INR: 0.98 (ref <1.50)
Prothrombin Time: 13 seconds (ref 11.6–15.2)

## 2014-01-26 LAB — CBC
HCT: 39.4 % (ref 36.0–46.0)
Hemoglobin: 13.2 g/dL (ref 12.0–15.0)
MCH: 26.3 pg (ref 26.0–34.0)
MCHC: 33.5 g/dL (ref 30.0–36.0)
MCV: 78.5 fL (ref 78.0–100.0)
MPV: 10.6 fL (ref 9.4–12.4)
Platelets: 265 10*3/uL (ref 150–400)
RBC: 5.02 MIL/uL (ref 3.87–5.11)
RDW: 16.1 % — ABNORMAL HIGH (ref 11.5–15.5)
WBC: 12.7 10*3/uL — ABNORMAL HIGH (ref 4.0–10.5)

## 2014-01-26 LAB — APTT: aPTT: 34 seconds (ref 24–37)

## 2014-02-01 ENCOUNTER — Telehealth: Payer: Self-pay | Admitting: Sports Medicine

## 2014-02-01 DIAGNOSIS — I10 Essential (primary) hypertension: Secondary | ICD-10-CM

## 2014-02-01 MED ORDER — AMLODIPINE BESYLATE 10 MG PO TABS: 10.0000 mg | ORAL_TABLET | Freq: Every day | ORAL | Status: DC

## 2014-02-01 NOTE — Telephone Encounter (Signed)
Amlodipine refill.

## 2014-02-01 NOTE — Telephone Encounter (Signed)
Error. Rhonda Cunningham,CMA  

## 2014-02-07 ENCOUNTER — Ambulatory Visit: Payer: BC Managed Care – PPO | Admitting: Sports Medicine

## 2014-02-08 ENCOUNTER — Ambulatory Visit (INDEPENDENT_AMBULATORY_CARE_PROVIDER_SITE_OTHER): Payer: BC Managed Care – PPO | Admitting: Sports Medicine

## 2014-02-08 ENCOUNTER — Encounter: Payer: Self-pay | Admitting: Sports Medicine

## 2014-02-08 VITALS — BP 137/84 | HR 114 | Ht 70.0 in | Wt 288.0 lb

## 2014-02-08 DIAGNOSIS — I8311 Varicose veins of right lower extremity with inflammation: Secondary | ICD-10-CM

## 2014-02-08 DIAGNOSIS — I8312 Varicose veins of left lower extremity with inflammation: Secondary | ICD-10-CM | POA: Diagnosis not present

## 2014-02-08 DIAGNOSIS — M791 Myalgia: Secondary | ICD-10-CM

## 2014-02-08 DIAGNOSIS — I872 Venous insufficiency (chronic) (peripheral): Secondary | ICD-10-CM

## 2014-02-08 DIAGNOSIS — M7918 Myalgia, other site: Secondary | ICD-10-CM | POA: Insufficient documentation

## 2014-02-08 MED ORDER — PHENTERMINE HCL 37.5 MG PO CAPS: 37.5000 mg | ORAL_CAPSULE | ORAL | Status: DC

## 2014-02-08 MED ORDER — BUPROPION HCL ER (XL) 150 MG PO TB24: 150.0000 mg | ORAL_TABLET | ORAL | Status: DC

## 2014-02-08 MED ORDER — DULOXETINE HCL 30 MG PO CPEP: 30.0000 mg | ORAL_CAPSULE | Freq: Every day | ORAL | Status: DC

## 2014-02-08 NOTE — Assessment & Plan Note (Signed)
Starting Cymbalta 

## 2014-02-08 NOTE — Assessment & Plan Note (Signed)
Still needs to obtain lower extremity compression hose

## 2014-02-08 NOTE — Assessment & Plan Note (Addendum)
Good continued weight loss. 50-60 pounds so far. Refilling phentermine.

## 2014-02-08 NOTE — Progress Notes (Signed)
  Subjective:    CC: Follow-up  HPI: Obesity: Excellent continued weight loss, approximately 60 pounds so far.  Leg swelling: Has not yet obtained lower extremity compression hose, symptoms are classic for venous stasis.  Depression/fibromyalgia: Amenable to try Cymbalta.  Past medical history, Surgical history, Family history not pertinant except as noted below, Social history, Allergies, and medications have been entered into the medical record, reviewed, and no changes needed.   Review of Systems: No fevers, chills, night sweats, weight loss, chest pain, or shortness of breath.   Objective:    General: Well Developed, well nourished, and in no acute distress.  Neuro: Alert and oriented x3, extra-ocular muscles intact, sensation grossly intact.  HEENT: Normocephalic, atraumatic, pupils equal round reactive to light, neck supple, no masses, no lymphadenopathy, thyroid nonpalpable.  Skin: Warm and dry, no rashes. Cardiac: Regular rate and rhythm, no murmurs rubs or gallops, no lower extremity edema.  Respiratory: Clear to auscultation bilaterally. Not using accessory muscles, speaking in full sentences.  Impression and Recommendations:

## 2014-02-21 ENCOUNTER — Other Ambulatory Visit: Payer: Self-pay | Admitting: Sports Medicine

## 2014-03-08 ENCOUNTER — Encounter: Payer: Self-pay | Admitting: Sports Medicine

## 2014-03-08 ENCOUNTER — Ambulatory Visit (INDEPENDENT_AMBULATORY_CARE_PROVIDER_SITE_OTHER): Payer: BLUE CROSS/BLUE SHIELD | Admitting: Sports Medicine

## 2014-03-08 VITALS — BP 135/79 | HR 98 | Ht 69.0 in | Wt 290.0 lb

## 2014-03-08 DIAGNOSIS — M791 Myalgia: Secondary | ICD-10-CM

## 2014-03-08 DIAGNOSIS — K120 Recurrent oral aphthae: Secondary | ICD-10-CM

## 2014-03-08 DIAGNOSIS — M7918 Myalgia, other site: Secondary | ICD-10-CM

## 2014-03-08 DIAGNOSIS — I8311 Varicose veins of right lower extremity with inflammation: Secondary | ICD-10-CM

## 2014-03-08 DIAGNOSIS — M2242 Chondromalacia patellae, left knee: Secondary | ICD-10-CM

## 2014-03-08 DIAGNOSIS — I8312 Varicose veins of left lower extremity with inflammation: Secondary | ICD-10-CM

## 2014-03-08 DIAGNOSIS — L659 Nonscarring hair loss, unspecified: Secondary | ICD-10-CM

## 2014-03-08 DIAGNOSIS — I872 Venous insufficiency (chronic) (peripheral): Secondary | ICD-10-CM

## 2014-03-08 MED ORDER — TRIAMCINOLONE ACETONIDE 0.1 % MT PSTE: 1.0000 "application " | PASTE | Freq: Two times a day (BID) | OROMUCOSAL | Status: DC

## 2014-03-08 MED ORDER — DULOXETINE HCL 60 MG PO CPEP: 60.0000 mg | ORAL_CAPSULE | Freq: Every day | ORAL | Status: DC

## 2014-03-08 MED ORDER — VALACYCLOVIR HCL 1 G PO TABS: 1000.0000 mg | ORAL_TABLET | Freq: Two times a day (BID) | ORAL | Status: DC

## 2014-03-08 MED ORDER — PHENTERMINE HCL 37.5 MG PO CAPS: 37.5000 mg | ORAL_CAPSULE | ORAL | Status: DC

## 2014-03-08 MED ORDER — MINOXIDIL 5 % EX FOAM: 1.0000 "application " | Freq: Two times a day (BID) | CUTANEOUS | Status: DC

## 2014-03-08 NOTE — Assessment & Plan Note (Signed)
Increasing Cymbalta to 60 mg daily. 

## 2014-03-08 NOTE — Assessment & Plan Note (Signed)
Checking TSH, CBC. Adding minoxidil foam.

## 2014-03-08 NOTE — Assessment & Plan Note (Signed)
Still has not obtained lower extremity compression stockings.

## 2014-03-08 NOTE — Assessment & Plan Note (Signed)
Weight loss of about 50 pounds so far. This represents her last month, refilling phentermine.

## 2014-03-08 NOTE — Assessment & Plan Note (Signed)
Currently working with her chiropractor and physical therapy. She is going to get patellar stabilizing orthoses.

## 2014-03-08 NOTE — Assessment & Plan Note (Signed)
Adding a course of Valtrex and Kenalog oral paste.

## 2014-03-08 NOTE — Progress Notes (Signed)
  Subjective:    CC:  Follow-up  HPI: Hair loss: Has really not tried minoxidil sufficiently. We have not yet done a serum workup  For secondary causes.   venous stasis dermatitis: Has not yet obtained lower extremity compression stockings.   Obesity: unfortunately did gain some weight , she is on her last month of phentermine.   Mouth lesions: Has several lesions that are painful on her inside lip.   patellofemoral pain: Currently going through physical therapy.   myofascial pain syndrome: Would like to go up on Cymbalta dose.  Past medical history, Surgical history, Family history not pertinant except as noted below, Social history, Allergies, and medications have been entered into the medical record, reviewed, and no changes needed.   Review of Systems: No fevers, chills, night sweats, weight loss, chest pain, or shortness of breath.   Objective:    General: Well Developed, well nourished, and in no acute distress.  Neuro: Alert and oriented x3, extra-ocular muscles intact, sensation grossly intact.  HEENT: Normocephalic, atraumatic, pupils equal round reactive to light, neck supple, no masses, no lymphadenopathy, thyroid nonpalpable.  Multiple oral aphthous ulcers. Skin: Warm and dry, no rashes. Cardiac: Regular rate and rhythm, no murmurs rubs or gallops, no lower extremity edema.  Respiratory: Clear to auscultation bilaterally. Not using accessory muscles, speaking in full sentences.  Impression and Recommendations:

## 2014-03-09 LAB — CBC
HCT: 41 % (ref 36.0–46.0)
Hemoglobin: 13.4 g/dL (ref 12.0–15.0)
MCH: 26.8 pg (ref 26.0–34.0)
MCHC: 32.7 g/dL (ref 30.0–36.0)
MCV: 82 fL (ref 78.0–100.0)
MPV: 9.8 fL (ref 8.6–12.4)
Platelets: 285 K/uL (ref 150–400)
RBC: 5 MIL/uL (ref 3.87–5.11)
RDW: 14.8 % (ref 11.5–15.5)
WBC: 12.1 K/uL — ABNORMAL HIGH (ref 4.0–10.5)

## 2014-03-09 LAB — TSH: TSH: 1.782 u[IU]/mL (ref 0.350–4.500)

## 2014-03-19 ENCOUNTER — Other Ambulatory Visit: Payer: Self-pay | Admitting: Sports Medicine

## 2014-03-27 ENCOUNTER — Other Ambulatory Visit: Payer: Self-pay | Admitting: Sports Medicine

## 2014-04-05 ENCOUNTER — Ambulatory Visit: Payer: BLUE CROSS/BLUE SHIELD | Admitting: Sports Medicine

## 2014-04-13 ENCOUNTER — Ambulatory Visit (INDEPENDENT_AMBULATORY_CARE_PROVIDER_SITE_OTHER): Payer: BLUE CROSS/BLUE SHIELD | Admitting: Sports Medicine

## 2014-04-13 ENCOUNTER — Encounter: Payer: Self-pay | Admitting: Sports Medicine

## 2014-04-13 VITALS — BP 146/86 | HR 118 | Ht 70.0 in | Wt 300.0 lb

## 2014-04-13 DIAGNOSIS — L659 Nonscarring hair loss, unspecified: Secondary | ICD-10-CM

## 2014-04-13 DIAGNOSIS — M791 Myalgia: Secondary | ICD-10-CM | POA: Diagnosis not present

## 2014-04-13 DIAGNOSIS — M2240 Chondromalacia patellae, unspecified knee: Secondary | ICD-10-CM

## 2014-04-13 DIAGNOSIS — M7918 Myalgia, other site: Secondary | ICD-10-CM

## 2014-04-13 MED ORDER — PHENTERMINE HCL 37.5 MG PO TABS: ORAL_TABLET | ORAL | Status: DC

## 2014-04-13 NOTE — Assessment & Plan Note (Signed)
Continue Cymbalta at current dose, due to current family stressors it is difficult to determine whether the increase has helped. She is still hyperalgesic on multiple occasions on her hips, back, and shoulders.

## 2014-04-13 NOTE — Assessment & Plan Note (Signed)
Total of 40 pound weight loss, she did put on 10 pounds since last month, her father is currently passing a kidney stone, and she is significantly stressed and anxious. She has finished 6 months of phentermine so we are going to switch to a half a tab daily.

## 2014-04-13 NOTE — Assessment & Plan Note (Signed)
Has not yet started minoxidil.

## 2014-04-13 NOTE — Progress Notes (Signed)
  Subjective:    CC: Follow-up  HPI: Obesity: Had excellent, 50 pound weight loss, unfortunately gained back 10 pounds in the last month. Her father is currently going through nephrolithiasis, and she has been stress eating.  Myofascial pain: Difficult to ascertain whether she's had an improvement since increasing her dosage of Cymbalta considering her recent family stressor.  Hypertension: No headaches, visual changes, chest pain.  Hair loss: Has not yet started minoxidil.  Past medical history, Surgical history, Family history not pertinant except as noted below, Social history, Allergies, and medications have been entered into the medical record, reviewed, and no changes needed.   Review of Systems: No fevers, chills, night sweats, weight loss, chest pain, or shortness of breath.   Objective:    General: Well Developed, well nourished, and in no acute distress.  Neuro: Alert and oriented x3, extra-ocular muscles intact, sensation grossly intact.  HEENT: Normocephalic, atraumatic, pupils equal round reactive to light, neck supple, no masses, no lymphadenopathy, thyroid nonpalpable.  Skin: Warm and dry, no rashes. Cardiac: Regular rate and rhythm, no murmurs rubs or gallops, no lower extremity edema.  Respiratory: Clear to auscultation bilaterally. Not using accessory muscles, speaking in full sentences.  Impression and Recommendations:

## 2014-04-14 MED ORDER — IBUPROFEN-FAMOTIDINE 800-26.6 MG PO TABS: 1.0000 | ORAL_TABLET | Freq: Three times a day (TID) | ORAL | Status: DC

## 2014-04-14 NOTE — Assessment & Plan Note (Signed)
Starting [employer]

## 2014-04-14 NOTE — Addendum Note (Signed)
Addended by: Monica BectonHEKKEKANDAM, Netty Sullivant J on: 04/14/2014 11:02 AM   Modules accepted: Orders

## 2014-04-26 ENCOUNTER — Other Ambulatory Visit: Payer: Self-pay | Admitting: Sports Medicine

## 2014-05-01 ENCOUNTER — Other Ambulatory Visit: Payer: Self-pay | Admitting: Sports Medicine

## 2014-06-03 ENCOUNTER — Other Ambulatory Visit: Payer: Self-pay | Admitting: Sports Medicine

## 2014-06-06 ENCOUNTER — Encounter: Payer: BLUE CROSS/BLUE SHIELD | Admitting: Sports Medicine

## 2014-06-06 ENCOUNTER — Encounter: Payer: Self-pay | Admitting: Sports Medicine

## 2014-06-06 ENCOUNTER — Encounter: Payer: Self-pay | Admitting: Physician Assistant

## 2014-06-06 ENCOUNTER — Ambulatory Visit (INDEPENDENT_AMBULATORY_CARE_PROVIDER_SITE_OTHER): Payer: BLUE CROSS/BLUE SHIELD | Admitting: Physician Assistant

## 2014-06-06 VITALS — BP 142/88 | HR 90 | Temp 97.8°F | Wt 300.0 lb

## 2014-06-06 DIAGNOSIS — J029 Acute pharyngitis, unspecified: Secondary | ICD-10-CM | POA: Diagnosis not present

## 2014-06-06 DIAGNOSIS — J069 Acute upper respiratory infection, unspecified: Secondary | ICD-10-CM | POA: Diagnosis not present

## 2014-06-06 MED ORDER — PREDNISONE 50 MG PO TABS: ORAL_TABLET | ORAL | Status: DC

## 2014-06-06 MED ORDER — HYDROCODONE-HOMATROPINE 5-1.5 MG/5ML PO SYRP
5.0000 mL | ORAL_SOLUTION | Freq: Every evening | ORAL | Status: DC | PRN
Start: 2014-06-06 — End: 2014-06-24

## 2014-06-06 MED ORDER — AZITHROMYCIN 250 MG PO TABS: ORAL_TABLET | ORAL | Status: DC

## 2014-06-06 NOTE — Patient Instructions (Signed)

## 2014-06-06 NOTE — Progress Notes (Signed)
   Subjective:    Patient ID: Chelsea [name], female    DOB: [birth year], 29 y.o.   MRN: [id number]  HPI  Patient is a 29 year old female who presents to the clinic with 3 days of upper respiratory symptoms. She is having a lot of sinus pressure, ear pain and sore throat. She has been around her whole house which has been sick for the last 6 days. They have not been to the doctor. She has been trying Advil, Robitussin and Sudafed for the last 3 days. She does not feel like she is getting any better. She does have a dry cough that keeps her up at night. No fever noted. She denies any shortness of breath or wheezing.    Review of Systems  All other systems reviewed and are negative.      Objective:   Physical Exam  Constitutional: She is oriented to person, place, and time. She appears well-developed and well-nourished.  Obese  HENT:  Head: Normocephalic and atraumatic.  TMs are slightly erythematous bilaterally. No signs of bacterial infection.  No significant tenderness over maxillary or frontal sinuses to palpation.  Bilateral nares are red and swollen.  Oropharynx is erythematous with some mild tonsillar swelling but no exudate.  Eyes: Conjunctivae are normal. Right eye exhibits no discharge. Left eye exhibits no discharge.  Neck: Normal range of motion. Neck supple.  Cardiovascular: Normal rate, regular rhythm and normal heart sounds.   Pulmonary/Chest: Effort normal and breath sounds normal. She has no wheezes.  Lymphadenopathy:    She has no cervical adenopathy.  Neurological: She is alert and oriented to person, place, and time.  Psychiatric: She has a normal mood and affect. Her behavior is normal.          Assessment & Plan:  Acute pharyngitis/upper respiratory infection-reassured patient that I feel like the symptoms are likely viral with 3 days in nature. Rapid strep today was negative. Discuss continuing over-the-counter treatment with mucinex, salt water gargles  and ibuprofen. I did give prednisone 50 mg for the next 5 days for inflammation. I did give Hycodan for cough at bedtime. From improving in the next 2-3 days patient was given a printed prescription for zpak that she can start. Pt has multiple allergies to abx. Pt reported tolerance to zpak. Discussed importance of not taking if no infection due to resistance.

## 2014-06-07 NOTE — Progress Notes (Signed)
This encounter was created in error - please disregard.

## 2014-06-24 ENCOUNTER — Encounter: Payer: Self-pay | Admitting: Physician Assistant

## 2014-06-24 ENCOUNTER — Ambulatory Visit (INDEPENDENT_AMBULATORY_CARE_PROVIDER_SITE_OTHER): Payer: BLUE CROSS/BLUE SHIELD | Admitting: Physician Assistant

## 2014-06-24 ENCOUNTER — Encounter: Payer: Self-pay | Admitting: Sports Medicine

## 2014-06-24 VITALS — BP 157/87 | HR 98 | Ht 70.0 in | Wt 309.0 lb

## 2014-06-24 DIAGNOSIS — J309 Allergic rhinitis, unspecified: Secondary | ICD-10-CM | POA: Diagnosis not present

## 2014-06-24 DIAGNOSIS — G43011 Migraine without aura, intractable, with status migrainosus: Secondary | ICD-10-CM

## 2014-06-24 MED ORDER — KETOROLAC TROMETHAMINE 30 MG/ML IJ SOLN
30.0000 mg | Freq: Once | INTRAMUSCULAR | Status: AC
  Administered 2014-06-24: 30 mg via INTRAMUSCULAR

## 2014-06-24 MED ORDER — SUMATRIPTAN SUCCINATE 100 MG PO TABS: 100.0000 mg | ORAL_TABLET | ORAL | Status: DC | PRN

## 2014-06-24 MED ORDER — METHYLPREDNISOLONE SODIUM SUCC 125 MG IJ SOLR
125.0000 mg | Freq: Once | INTRAMUSCULAR | Status: AC
  Administered 2014-06-24: 125 mg via INTRAMUSCULAR

## 2014-06-24 MED ORDER — MONTELUKAST SODIUM 10 MG PO TABS: 10.0000 mg | ORAL_TABLET | Freq: Every day | ORAL | Status: DC

## 2014-06-24 NOTE — Patient Instructions (Signed)

## 2014-06-24 NOTE — Progress Notes (Signed)
   Subjective:    Patient ID: Chelsea [name], female    DOB: 1985-10-20, 29 y.o.   MRN: [id number]  HPI  Patient is a 29 yo female who presents to the clinic with a headache over her right side of head and behind the eyes for the last 4 days. She has a history of migraines but not very often. Her first migraine was in 2002 and they usually respond to sleep. This migraine has lasted through 4 nights for sleep. She does feel nauseated but no vomiting. She is very light and sound sensitive. She denies any vision changes she denies any slurred speech or weakness. She has not started any new medicines that she thinks could be causing. She did start Clomid yesterday but the migraine started before. She denies any GI issues. She denies any cough or shortness of breath. She does have a lot of allergies with red itchy watery eyes nasal congestion and off and on sore throat. She takes Zyrtec daily but does not feel like it's working. s.   Review of Systems  All other systems reviewed and are negative.      Objective:   Physical Exam  Constitutional: She is oriented to person, place, and time. She appears well-developed and well-nourished.  Obese  HENT:  Head: Normocephalic and atraumatic.  TMs were slightly erythematous with a bulge but no dullness, pus, or blood.  Oropharynx was erythematous with no tonsillar swelling or exudate.  Bilateral nares are red and swollen.  Eyes:  Some slight injection of bilateral eyes with watery discharge.  Neck: Normal range of motion. Neck supple.  Cardiovascular: Normal rate, regular rhythm and normal heart sounds.   Pulmonary/Chest: Effort normal and breath sounds normal. She has no wheezes.  Lymphadenopathy:    She has no cervical adenopathy.  Neurological: She is alert and oriented to person, place, and time.  Skin:  Flushed cheeks.  Psychiatric: She has a normal mood and affect. Her behavior is normal.          Assessment & Plan:   Migraine/allergic sinusitis-patient presented with a history of a four-day migraine. She was given Solu-Medrol 125 mg IM in office today with Toradol 30 mg IM. Not exactly sure what the trigger could've been. I suspect there could be an allergic.Marland Kitchen. Patient was certainly tender over her maxillary sinuses. She also reports allergy symptoms that Zyrtec is not helping with. I did add Singulair 10 mg at bedtime today. She continues with or without combination of Zyrtec. I did give her a small quantity of Imitrex to try at onset of next migraine.

## 2014-06-29 ENCOUNTER — Ambulatory Visit (INDEPENDENT_AMBULATORY_CARE_PROVIDER_SITE_OTHER): Payer: BLUE CROSS/BLUE SHIELD | Admitting: Physician Assistant

## 2014-06-29 ENCOUNTER — Encounter: Payer: Self-pay | Admitting: Physician Assistant

## 2014-06-29 VITALS — BP 152/84 | HR 99 | Ht 70.0 in | Wt 303.0 lb

## 2014-06-29 DIAGNOSIS — G43011 Migraine without aura, intractable, with status migrainosus: Secondary | ICD-10-CM

## 2014-06-29 MED ORDER — RIZATRIPTAN BENZOATE 10 MG PO TABS
10.0000 mg | ORAL_TABLET | ORAL | Status: DC | PRN
Start: 2014-06-29 — End: 2014-09-26

## 2014-06-29 MED ORDER — KETOROLAC TROMETHAMINE 60 MG/2ML IM SOLN
60.0000 mg | Freq: Once | INTRAMUSCULAR | Status: AC
  Administered 2014-06-29: 60 mg via INTRAMUSCULAR

## 2014-06-29 MED ORDER — TOPIRAMATE 25 MG PO TABS: ORAL_TABLET | ORAL | Status: DC

## 2014-06-29 NOTE — Progress Notes (Signed)
   Subjective:    Patient ID: Chelsea LankCarla Farewell, female    DOB: Jul 08, 1985, 29 y.o.   MRN: [id number]  HPI  Pt presents to the clinic with a migraine that has been fairly persistent for 1 and 1/2 weeks. She did get a 2 day break after toradol injection in the office last Friday. Continues to describe as behind right eye and radiates to the back. Very sensitive to light. Some nausea but no vomiting. Sees somes spots off and on but no halo or hallucinations. Imitrex did not work at all even after 2nd dose. Laying down does help. No vision changes. No new medications. She was having HA's before clomid. excedrin and benadryl help the most. No fever, chills, sinus pressure, ear pain, ST, cough, SOB or wheezing.     Review of Systems  All other systems reviewed and are negative.      Objective:   Physical Exam  Constitutional: She is oriented to person, place, and time. She appears well-developed and well-nourished.  HENT:  Head: Normocephalic and atraumatic.  Right Ear: External ear normal.  Left Ear: External ear normal.  Nose: Nose normal.  Mouth/Throat: Oropharynx is clear and moist. No oropharyngeal exudate.  Eyes: Conjunctivae and EOM are normal. Pupils are equal, round, and reactive to light. Right eye exhibits no discharge. Left eye exhibits no discharge.  Neck: Normal range of motion. Neck supple.  Pulmonary/Chest: Effort normal and breath sounds normal. She has no wheezes.  Lymphadenopathy:    She has no cervical adenopathy.  Neurological: She is alert and oriented to person, place, and time. She has normal reflexes. No cranial nerve deficit. Coordination normal.  Skin:  Flushed cheeks.   Psychiatric: She has a normal mood and affect. Her behavior is normal.          Assessment & Plan:  Migraine- Toradol 60mg IM to give relief today. Due to persisent nature of headache will start preventative Topamax. Discussed side effects and she will need to stop prior to getting pregnant.  Stop imitrex since not working and try Maxalt for rescue. Pt at first was unsure of trigger. She was given singular at last visit for allergies but she never started. She does admit to bad allergies. She also called back and stated she recently stopped drinking drinks with aspartame in the when previously she was drinking 4Liters a day. This could certainly be the trigger. No red flags today to need imaging. No focal neurological sympmtoms present. If not improving follow up with PCP.

## 2014-07-01 ENCOUNTER — Other Ambulatory Visit: Payer: Self-pay | Admitting: Sports Medicine

## 2014-07-12 ENCOUNTER — Ambulatory Visit: Payer: BLUE CROSS/BLUE SHIELD | Admitting: Sports Medicine

## 2014-07-15 ENCOUNTER — Encounter: Payer: Self-pay | Admitting: Sports Medicine

## 2014-07-19 ENCOUNTER — Ambulatory Visit (INDEPENDENT_AMBULATORY_CARE_PROVIDER_SITE_OTHER): Payer: BLUE CROSS/BLUE SHIELD | Admitting: Sports Medicine

## 2014-07-19 ENCOUNTER — Encounter: Payer: Self-pay | Admitting: Sports Medicine

## 2014-07-19 VITALS — BP 147/82 | HR 97 | Ht 70.0 in | Wt 305.0 lb

## 2014-07-19 DIAGNOSIS — L659 Nonscarring hair loss, unspecified: Secondary | ICD-10-CM

## 2014-07-19 DIAGNOSIS — I10 Essential (primary) hypertension: Secondary | ICD-10-CM | POA: Diagnosis not present

## 2014-07-19 DIAGNOSIS — M791 Myalgia: Secondary | ICD-10-CM

## 2014-07-19 DIAGNOSIS — M7918 Myalgia, other site: Secondary | ICD-10-CM

## 2014-07-19 DIAGNOSIS — E282 Polycystic ovarian syndrome: Secondary | ICD-10-CM

## 2014-07-19 MED ORDER — DULOXETINE HCL 60 MG PO CPEP: 120.0000 mg | ORAL_CAPSULE | Freq: Every day | ORAL | Status: DC

## 2014-07-19 MED ORDER — LIRAGLUTIDE -WEIGHT MANAGEMENT 18 MG/3ML ~~LOC~~ SOPN: 3.0000 mg | PEN_INJECTOR | Freq: Every day | SUBCUTANEOUS | Status: DC

## 2014-07-19 NOTE — Assessment & Plan Note (Signed)
This will likely improve with discontinuation of phentermine.

## 2014-07-19 NOTE — Assessment & Plan Note (Signed)
Overall doing well, going to increase Cymbalta to 120 mg.

## 2014-07-19 NOTE — Progress Notes (Signed)
  Subjective:    CC: Follow-up  HPI: Hair loss: Has not yet started minoxidil  Myofascial pain syndrome: Good response to 60 of Cymbalta, does desire to go up to the max dose.  Obesity: Amenable to try Saxenda.  Past medical history, Surgical history, Family history not pertinant except as noted below, Social history, Allergies, and medications have been entered into the medical record, reviewed, and no changes needed.   Review of Systems: No fevers, chills, night sweats, weight loss, chest pain, or shortness of breath.   Objective:    General: Well Developed, well nourished, and in no acute distress.  Neuro: Alert and oriented x3, extra-ocular muscles intact, sensation grossly intact.  HEENT: Normocephalic, atraumatic, pupils equal round reactive to light, neck supple, no masses, no lymphadenopathy, thyroid nonpalpable.  Skin: Warm and dry, no rashes. Cardiac: Regular rate and rhythm, no murmurs rubs or gallops, no lower extremity edema.  Respiratory: Clear to auscultation bilaterally. Not using accessory muscles, speaking in full sentences.  Impression and Recommendations:

## 2014-07-19 NOTE — Assessment & Plan Note (Signed)
Currently working with [facility] regarding donor zygote implantation.

## 2014-07-19 NOTE — Assessment & Plan Note (Signed)
Doing well with complementary and alternative topical treatments, she has not yet started the minoxidil.

## 2014-07-19 NOTE — Patient Instructions (Signed)
Print discount coupon from [email or web address]

## 2014-07-19 NOTE — Assessment & Plan Note (Signed)
Good overall weight loss with phentermine. We can discontinue now, it to the 9 months, I'm going to switch to [facility]. She will print out a discount coupon online. Return in one month to see how things are going.

## 2014-07-29 ENCOUNTER — Telehealth: Payer: Self-pay | Admitting: Sports Medicine

## 2014-07-29 NOTE — Telephone Encounter (Signed)
Received fax from pharmacy for prior authorization on Saxenda. Sent through cover my meds waiting on authorization. - CF

## 2014-07-30 ENCOUNTER — Other Ambulatory Visit: Payer: Self-pay | Admitting: Sports Medicine

## 2014-08-04 NOTE — Telephone Encounter (Signed)
Received fax from [facility] and Bernie Covey was approved reference #[id number] effective from 07/29/2014 - 12/01/2014 - CF

## 2014-08-19 ENCOUNTER — Ambulatory Visit (INDEPENDENT_AMBULATORY_CARE_PROVIDER_SITE_OTHER): Payer: BLUE CROSS/BLUE SHIELD | Admitting: Sports Medicine

## 2014-08-19 ENCOUNTER — Encounter: Payer: Self-pay | Admitting: Sports Medicine

## 2014-08-19 NOTE — Progress Notes (Signed)
  Subjective:    CC: Weight check  HPI: Obesity: Starting to have some weight loss on Saxenda, 4 pounds, she is currently 2.4 mg daily. She has found a sperm donor, and is going to undergo intrauterine insemination next month. She will have stop all weight loss medicines when she becomes pregnant.  Past medical history, Surgical history, Family history not pertinant except as noted below, Social history, Allergies, and medications have been entered into the medical record, reviewed, and no changes needed.   Review of Systems: No fevers, chills, night sweats, weight loss, chest pain, or shortness of breath.   Objective:    General: Well Developed, well nourished, and in no acute distress.  Neuro: Alert and oriented x3, extra-ocular muscles intact, sensation grossly intact.  HEENT: Normocephalic, atraumatic, pupils equal round reactive to light, neck supple, no masses, no lymphadenopathy, thyroid nonpalpable.  Skin: Warm and dry, no rashes. Cardiac: Regular rate and rhythm, no murmurs rubs or gallops, no lower extremity edema.  Respiratory: Clear to auscultation bilaterally. Not using accessory muscles, speaking in full sentences.  Impression and Recommendations:

## 2014-08-19 NOTE — Assessment & Plan Note (Signed)
Patient may go ahead and increase to full dose of Saxenda. She has lost weight. She and her partner have purchased sperm from the donor and she is being set up for intrauterine semination. Once she is pregnant she will stop the weight loss medication. I'm happy to take over pediatric care of her infant when delivered, and certainly after delivery we can restart phentermine, Saxenda, and Topamax.

## 2014-08-21 ENCOUNTER — Other Ambulatory Visit: Payer: Self-pay | Admitting: Sports Medicine

## 2014-08-24 ENCOUNTER — Encounter: Payer: Self-pay | Admitting: Sports Medicine

## 2014-08-26 ENCOUNTER — Telehealth: Payer: Self-pay | Admitting: Nurse Practitioner

## 2014-08-26 DIAGNOSIS — N3000 Acute cystitis without hematuria: Secondary | ICD-10-CM

## 2014-08-26 MED ORDER — NITROFURANTOIN MONOHYD MACRO 100 MG PO CAPS: 100.0000 mg | ORAL_CAPSULE | Freq: Two times a day (BID) | ORAL | Status: DC

## 2014-08-26 NOTE — Progress Notes (Signed)
We are sorry that you are not feeling well.  Here is how we plan to help!  Based on what you shared with me it looks like you most likely have a simple urinary tract infection.  A UTI (Urinary Tract Infection) is a bacterial infection of the bladder.  Most cases of urinary tract infections are simple to treat but a key part of your care is to encourage you to drink plenty of fluids and watch your symptoms carefully.  I have prescribed MacroBid 100 mg twice a day for 5 days.  Your symptoms should gradually improve. Call us if the burning in your urine worsens, you develop worsening fever, back pain or pelvic pain or if your symptoms do not resolve after completing the antibiotic.  Urinary tract infections can be prevented by drinking plenty of water to keep your body hydrated.  Also be sure when you wipe, wipe from front to back and don't hold it in!  If possible, empty your bladder every 4 hours.  Your e-visit answers were reviewed by a board certified advanced clinical practitioner to complete your personal care plan.  Depending on the condition, your plan could have included both over the counter or prescription medications.  If there is a problem please reply  once you have received a response from your provider.  Your safety is important to us.  If you have drug allergies check your prescription carefully.    You can use MyChart to ask questions about today's visit, request a non-urgent call back, or ask for a work or school excuse.  You will get an e-mail in the next two days asking about your experience.  I hope that your e-visit has been valuable and will speed your recovery. Thank you for using e-visits.    

## 2014-08-28 NOTE — Telephone Encounter (Signed)
The patient and I discussed the unlikelihood of disability in the office.

## 2014-09-02 ENCOUNTER — Encounter (HOSPITAL_BASED_OUTPATIENT_CLINIC_OR_DEPARTMENT_OTHER): Payer: Self-pay | Admitting: *Deleted

## 2014-09-02 NOTE — Progress Notes (Addendum)
NPO AFTER MN.  ARRIVE AT 0930.  NEEDS ISTAT , EKG,  AND URINE PREG. WILL TAKE ZYRTEC AM DOS W/ SIPS OF WATER AND IF NEEDED MAY TAKE MAXALT/ TOPAMAX.

## 2014-09-06 NOTE — H&P (Signed)
Chelsea [name] is a 29 y.o. female , originally referred to me by Dr. Gaylyn Cheers, for abnormal uterine bleeding.  She was diagnosed with 3 polypoid filling defects.  1. 11x11 mm on posterior wall midline. 2. 10x7 mm on fundal, left cornual. 3. 10x7 mm on anterior, midline.   Patient would like to preserve her childbearing potential.  Pertinent Gynecological History: Menses: flow is excessive with use of 3 pads or tampons on heaviest days Bleeding: dysfunctional uterine bleeding Contraception: none DES exposure: denies Blood transfusions: none Sexually transmitted diseases: no past history Previous GYN Procedures: no  Last mammogram: normal Last pap: normal  OB History: G0   Menstrual History: Menarche age: 86 No LMP recorded.    Past Medical History  Diagnosis Date  . Hypertension   . PCOS (polycystic ovarian syndrome)   . Endometrial polyp   . History of recurrent UTIs   . PONV (postoperative nausea and vomiting)   . Seasonal allergic rhinitis   . Myofascial muscle pain   . OA (osteoarthritis) of knee     BILATERAL                    Past Surgical History  Procedure Laterality Date  . I & d right foot  1998  . Tonsillectomy  2001  . Wisdom tooth extraction  2004             Family History  Problem Relation Age of Onset  . Diabetes Mother   . Polycystic ovary syndrome Mother   . Hypertension Father   . Diabetes Father   . Fibromyalgia Maternal Aunt   . Fibromyalgia Cousin    No hereditary disease.  No cancer of breast, ovary, uterus. No cutaneous leiomyomatosis or renal cell carcinoma.  History   Social History  . Marital Status: Single    Spouse Name: N/A  . Number of Children: N/A  . Years of Education: N/A   Occupational History  . Not on file.   Social History Main Topics  . Smoking status: Never Smoker   . Smokeless tobacco: Never Used  . Alcohol Use: No  . Drug Use: No  . Sexual Activity: Not on file   Other Topics Concern   . Not on file   Social History Narrative    Allergies  Allergen Reactions  . Amoxicillin Other (See Comments)    Caused yeast infection  . Ceclor [Cefaclor] Other (See Comments)    Caused yeast infection  . Enalapril Maleate Swelling    REACTION: lip swelling  . Erythromycin Diarrhea  . Penicillins Other (See Comments)    Caused yeast infection    No current facility-administered medications on file prior to encounter.   Current Outpatient Prescriptions on File Prior to Encounter  Medication Sig Dispense Refill  . amLODipine (NORVASC) 10 MG tablet Take 1 tablet (10 mg total) by mouth daily. (Patient taking differently: Take 10 mg by mouth every evening. ) 30 tablet 11  . Biotin 1 MG CAPS Take 1 capsule by mouth 2 (two) times daily.     . cyclobenzaprine (FLEXERIL) 10 MG tablet TAKE 1/2 TABLET BY MOUTH AT BEDTIME, THEN GRADUALLY INCREASE DOSE TO 1 TABLET THREE TIMES DAILY (Patient taking differently: TAKE 1/2 TABLET BY MOUTH AT BEDTIME, THEN GRADUALLY INCREASE DOSE TO 1 TABLET THREE TIMES DAILY---   PT TAKES PRN) 30 tablet 0  . DULoxetine (CYMBALTA) 60 MG capsule Take 2 capsules (120 mg total) by mouth daily. (Patient taking differently: Take 120  mg by mouth every evening. ) 60 capsule 3  . Liraglutide -Weight Management (SAXENDA) 18 MG/3ML SOPN Inject 3 mg into the skin daily. 0.6 mg injected subcutaneously daily for 1 week, then increase by 0.6 mg weekly until reaching 3 mg injected subcutaneous daily 3 pen 0  . montelukast (SINGULAIR) 10 MG tablet Take 1 tablet (10 mg total) by mouth at bedtime. 30 tablet 2  . rizatriptan (MAXALT) 10 MG tablet Take 1 tablet (10 mg total) by mouth as needed for migraine. May repeat in 2 hours if needed 10 tablet 0  . topiramate (TOPAMAX) 25 MG tablet Take one tablet at bedtime for 1 week then increase to 2 tablets at bedtime for 1 week then increase to 1 tablet in am and 2 tablets in pm for 1 week, then increase to 2 tablets am and pm for migraine  prevention. (Patient taking differently: Take 25 mg by mouth as needed. MIGRAINE PREVENTION) 120 tablet 1  . valACYclovir (VALTREX) 1000 MG tablet Take 1 tablet (1,000 mg total) by mouth 2 (two) times daily. (Patient taking differently: Take 1,000 mg by mouth daily as needed. ) 14 tablet 2     Review of Systems  Constitutional: Negative.   HENT: Negative.   Eyes: Negative.   Respiratory: Negative.   Cardiovascular: Negative.   Gastrointestinal: Negative.   Genitourinary: Negative.   Musculoskeletal: Negative.   Skin: Negative.   Neurological: Negative.   Endo/Heme/Allergies: Negative.   Psychiatric/Behavioral: Negative.      Physical Exam  Ht 5\' 9"  (1.753 m)  Wt 133.811 kg (295 lb)  BMI 43.54 kg/m2  LMP 08/14/2014 (Exact Date) Constitutional: She is oriented to person, place, and time. She appears well-developed and well-nourished.  HENT:  Head: Normocephalic and atraumatic.  Nose: Nose normal.  Mouth/Throat: Oropharynx is clear and moist. No oropharyngeal exudate.  Eyes: Conjunctivae normal and EOM are normal. Pupils are equal, round, and reactive to light. No scleral icterus.  Neck: Normal range of motion. Neck supple. No tracheal deviation present. No thyromegaly present.  Cardiovascular: Normal rate.   Respiratory: Effort normal and breath sounds normal.  GI: Soft. Bowel sounds are normal. She exhibits no distension and no mass. There is no tenderness.  Lymphadenopathy:    She has no cervical adenopathy.  Neurological: She is alert and oriented to person, place, and time. She has normal reflexes.  Skin: Skin is warm.  Psychiatric: She has a normal mood and affect. Her behavior is normal. Judgment and thought content normal.       Assessment/Plan:  There are 3 polypoid filling defects, as outlined above. I discussed with the patient the need to perform a hysteroscopy, polypectomy, possible suction D&C. There is a very small chance of such lesions being malignant, or  premalignant, we will still send him to pathology.

## 2014-09-07 ENCOUNTER — Ambulatory Visit (HOSPITAL_BASED_OUTPATIENT_CLINIC_OR_DEPARTMENT_OTHER)
Admission: RE | Admit: 2014-09-07 | Discharge: 2014-09-07 | Disposition: A | Discharge status: Home / Self Care | Payer: BLUE CROSS/BLUE SHIELD | Source: Ambulatory Visit | Attending: Obstetrics and Gynecology | Admitting: Obstetrics and Gynecology

## 2014-09-07 ENCOUNTER — Other Ambulatory Visit: Payer: Self-pay

## 2014-09-07 ENCOUNTER — Encounter (HOSPITAL_BASED_OUTPATIENT_CLINIC_OR_DEPARTMENT_OTHER): Payer: Self-pay

## 2014-09-07 ENCOUNTER — Ambulatory Visit (HOSPITAL_BASED_OUTPATIENT_CLINIC_OR_DEPARTMENT_OTHER): Payer: BLUE CROSS/BLUE SHIELD | Admitting: Anesthesiology

## 2014-09-07 ENCOUNTER — Encounter (HOSPITAL_BASED_OUTPATIENT_CLINIC_OR_DEPARTMENT_OTHER)
Admission: RE | Disposition: A | Discharge status: Home / Self Care | Payer: Self-pay | Source: Ambulatory Visit | Attending: Obstetrics and Gynecology

## 2014-09-07 DIAGNOSIS — N939 Abnormal uterine and vaginal bleeding, unspecified: Secondary | ICD-10-CM | POA: Diagnosis present

## 2014-09-07 DIAGNOSIS — E282 Polycystic ovarian syndrome: Secondary | ICD-10-CM | POA: Diagnosis not present

## 2014-09-07 DIAGNOSIS — J302 Other seasonal allergic rhinitis: Secondary | ICD-10-CM | POA: Diagnosis not present

## 2014-09-07 DIAGNOSIS — I10 Essential (primary) hypertension: Secondary | ICD-10-CM | POA: Diagnosis not present

## 2014-09-07 DIAGNOSIS — N840 Polyp of corpus uteri: Secondary | ICD-10-CM | POA: Insufficient documentation

## 2014-09-07 DIAGNOSIS — M170 Bilateral primary osteoarthritis of knee: Secondary | ICD-10-CM | POA: Insufficient documentation

## 2014-09-07 DIAGNOSIS — Z79899 Other long term (current) drug therapy: Secondary | ICD-10-CM | POA: Insufficient documentation

## 2014-09-07 DIAGNOSIS — I499 Cardiac arrhythmia, unspecified: Secondary | ICD-10-CM | POA: Diagnosis not present

## 2014-09-07 DIAGNOSIS — Z6841 Body Mass Index (BMI) 40.0 and over, adult: Secondary | ICD-10-CM | POA: Insufficient documentation

## 2014-09-07 HISTORY — DX: Polyp of corpus uteri: N84.0

## 2014-09-07 HISTORY — DX: Other seasonal allergic rhinitis: J30.2

## 2014-09-07 HISTORY — DX: Unilateral primary osteoarthritis, unspecified knee: M17.10

## 2014-09-07 HISTORY — DX: Nausea with vomiting, unspecified: R11.2

## 2014-09-07 HISTORY — DX: Osteoarthritis of knee, unspecified: M17.9

## 2014-09-07 HISTORY — PX: HYSTEROSCOPY WITH D & C: SHX1775

## 2014-09-07 HISTORY — DX: Personal history of urinary (tract) infections: Z87.440

## 2014-09-07 HISTORY — DX: Myalgia, other site: M79.18

## 2014-09-07 HISTORY — DX: Other specified postprocedural states: Z98.890

## 2014-09-07 LAB — POCT I-STAT, CHEM 8
BUN: 7 mg/dL (ref 6–20)
CHLORIDE: 101 mmol/L (ref 101–111)
CREATININE: 0.6 mg/dL (ref 0.44–1.00)
Calcium, Ion: 1.13 mmol/L (ref 1.12–1.23)
GLUCOSE: 94 mg/dL (ref 65–99)
HCT: 36 % (ref 36.0–46.0)
Hemoglobin: 12.2 g/dL (ref 12.0–15.0)
Potassium: 3.4 mmol/L — ABNORMAL LOW (ref 3.5–5.1)
Sodium: 139 mmol/L (ref 135–145)
TCO2: 25 mmol/L (ref 0–100)

## 2014-09-07 LAB — POCT PREGNANCY, URINE: PREG TEST UR: NEGATIVE

## 2014-09-07 SURGERY — DILATATION AND CURETTAGE /HYSTEROSCOPY
Anesthesia: General | Site: Uterus

## 2014-09-07 MED ORDER — MIDAZOLAM HCL 5 MG/5ML IJ SOLN
INTRAMUSCULAR | Status: DC | PRN
  Administered 2014-09-07: 2 mg via INTRAVENOUS

## 2014-09-07 MED ORDER — DEXAMETHASONE SODIUM PHOSPHATE 4 MG/ML IJ SOLN
INTRAMUSCULAR | Status: DC | PRN
  Administered 2014-09-07: 10 mg via INTRAVENOUS

## 2014-09-07 MED ORDER — FENTANYL CITRATE (PF) 100 MCG/2ML IJ SOLN
INTRAMUSCULAR | Status: DC | PRN
  Administered 2014-09-07 (×2): 50 ug via INTRAVENOUS

## 2014-09-07 MED ORDER — CIPROFLOXACIN IN D5W 400 MG/200ML IV SOLN
INTRAVENOUS | Status: AC
  Filled 2014-09-07: qty 200

## 2014-09-07 MED ORDER — PROPOFOL 10 MG/ML IV BOLUS
INTRAVENOUS | Status: DC | PRN
  Administered 2014-09-07: 200 mg via INTRAVENOUS

## 2014-09-07 MED ORDER — MIDAZOLAM HCL 2 MG/2ML IJ SOLN
INTRAMUSCULAR | Status: AC
  Filled 2014-09-07: qty 2

## 2014-09-07 MED ORDER — CLINDAMYCIN PHOSPHATE 900 MG/50ML IV SOLN
900.0000 mg | INTRAVENOUS | Status: AC
  Administered 2014-09-07: 900 mg via INTRAVENOUS
  Filled 2014-09-07: qty 50

## 2014-09-07 MED ORDER — LACTATED RINGERS IV SOLN
INTRAVENOUS | Status: DC
  Administered 2014-09-07: 11:00:00 via INTRAVENOUS
  Filled 2014-09-07: qty 1000

## 2014-09-07 MED ORDER — VASOPRESSIN 20 UNIT/ML IV SOLN
INTRAVENOUS | Status: DC | PRN
  Administered 2014-09-07: 6 mL via INTRAMUSCULAR

## 2014-09-07 MED ORDER — ONDANSETRON HCL 4 MG/2ML IJ SOLN
INTRAMUSCULAR | Status: DC | PRN
  Administered 2014-09-07: 4 mg via INTRAVENOUS

## 2014-09-07 MED ORDER — SODIUM CHLORIDE 0.9 % IR SOLN
Status: DC | PRN
  Administered 2014-09-07: 3000 mL

## 2014-09-07 MED ORDER — CLINDAMYCIN PHOSPHATE 900 MG/50ML IV SOLN
INTRAVENOUS | Status: AC
  Filled 2014-09-07: qty 50

## 2014-09-07 MED ORDER — KETOROLAC TROMETHAMINE 30 MG/ML IJ SOLN
INTRAMUSCULAR | Status: DC | PRN
  Administered 2014-09-07: 30 mg via INTRAVENOUS

## 2014-09-07 MED ORDER — CIPROFLOXACIN IN D5W 400 MG/200ML IV SOLN
400.0000 mg | INTRAVENOUS | Status: AC
  Administered 2014-09-07: 400 mg via INTRAVENOUS
  Filled 2014-09-07: qty 200

## 2014-09-07 MED ORDER — LIDOCAINE HCL (CARDIAC) 20 MG/ML IV SOLN
INTRAVENOUS | Status: DC | PRN
  Administered 2014-09-07: 100 mg via INTRAVENOUS

## 2014-09-07 MED ORDER — FENTANYL CITRATE (PF) 100 MCG/2ML IJ SOLN
INTRAMUSCULAR | Status: AC
  Filled 2014-09-07: qty 4

## 2014-09-07 SURGICAL SUPPLY — 44 items
CANISTER SUCTION 2500CC (MISCELLANEOUS) ×2 IMPLANT
CANNULA CURETTE W/SYR 6 (CANNULA) IMPLANT
CANNULA CURETTE W/SYR 7 (CANNULA) ×1 IMPLANT
CATH HSG 5FRX28CM (CATHETERS) IMPLANT
CATH INTRA ACCESS BALLN (BALLOONS) IMPLANT
CATH ROBINSON RED A/P 16FR (CATHETERS) ×1 IMPLANT
CATH SALPINGOGRAPHY SELECT (BALLOONS) IMPLANT
CATH SSG INJECTION W/GUIDEWIRE (BALLOONS) IMPLANT
CORD ACTIVE DISPOSABLE (ELECTRODE)
CORD ELECTRO ACTIVE DISP (ELECTRODE) IMPLANT
COVER BACK TABLE 60X90IN (DRAPES) ×2 IMPLANT
DRAPE HYSTEROSCOPY (DRAPE) ×2 IMPLANT
DRAPE LG THREE QUARTER DISP (DRAPES) ×3 IMPLANT
ELECT LOOP GYNE PRO 24FR (CUTTING LOOP)
ELECT REM PT RETURN 9FT ADLT (ELECTROSURGICAL)
ELECT VAPORTRODE GRVD BAR (ELECTRODE) IMPLANT
ELECTRODE KNIFE SHAPED 22FR (ELECTROSURGICAL) IMPLANT
ELECTRODE LOOP GYNE PRO 24FR (CUTTING LOOP) IMPLANT
ELECTRODE REM PT RTRN 9FT ADLT (ELECTROSURGICAL) IMPLANT
GLOVE BIO SURGEON STRL SZ 6.5 (GLOVE) ×1 IMPLANT
GLOVE BIO SURGEON STRL SZ8 (GLOVE) ×2 IMPLANT
GLOVE BIOGEL PI IND STRL 6.5 (GLOVE) IMPLANT
GLOVE BIOGEL PI IND STRL 8.5 (GLOVE) ×1 IMPLANT
GLOVE BIOGEL PI INDICATOR 6.5 (GLOVE) ×1
GLOVE BIOGEL PI INDICATOR 8.5 (GLOVE) ×1
GOWN STRL REUS W/ TWL XL LVL3 (GOWN DISPOSABLE) ×2 IMPLANT
GOWN STRL REUS W/TWL XL LVL3 (GOWN DISPOSABLE) ×2
LEGGING LITHOTOMY PAIR STRL (DRAPES) ×2 IMPLANT
LOOP ANGLED CUTTING 22FR (CUTTING LOOP) IMPLANT
MANIFOLD NEPTUNE II (INSTRUMENTS) IMPLANT
NDL SPNL 22GX3.5 QUINCKE BK (NEEDLE) ×1 IMPLANT
NEEDLE SPNL 22GX3.5 QUINCKE BK (NEEDLE) ×2 IMPLANT
PACK BASIN DAY SURGERY FS (CUSTOM PROCEDURE TRAY) ×2 IMPLANT
PAD OB MATERNITY 4.3X12.25 (PERSONAL CARE ITEMS) ×2 IMPLANT
SET IRRIG Y TYPE TUR BLADDER L (SET/KITS/TRAYS/PACK) IMPLANT
STENT BALLN UTERINE 4CM 6FR (STENTS) IMPLANT
SUT SILK 2 0 SH (SUTURE) IMPLANT
SUT SILK 3 0 PS 1 (SUTURE) IMPLANT
SYR 20CC LL (SYRINGE) IMPLANT
SYR 3ML 18GX1 1/2 (SYRINGE) ×2 IMPLANT
TOWEL OR 17X24 6PK STRL BLUE (TOWEL DISPOSABLE) ×4 IMPLANT
TRAY DSU PREP LF (CUSTOM PROCEDURE TRAY) ×2 IMPLANT
TUBE CONNECTING 12X1/4 (SUCTIONS) ×2 IMPLANT
WATER STERILE IRR 500ML POUR (IV SOLUTION) ×2 IMPLANT

## 2014-09-07 NOTE — Anesthesia Postprocedure Evaluation (Signed)
  Anesthesia Post-op Note  Patient: Chelsea LankCarla [name]  Procedure(s) Performed: Procedure(s) (LRB):  DILATATION AND CURETTAGE /HYSTEROSCOPY/POLYPECTOMY (N/A)  Patient Location: PACU  Anesthesia Type: General  Level of Consciousness: awake and alert   Airway and Oxygen Therapy: Patient Spontanous Breathing  Post-op Pain: mild  Post-op Assessment: Post-op Vital signs reviewed, Patient's Cardiovascular Status Stable, Respiratory Function Stable, Patent Airway and No signs of Nausea or vomiting  Last Vitals:  Filed Vitals:   09/07/14 1427  BP: 120/67  Pulse: 99  Temp: 36.4 C  Resp: 16    Post-op Vital Signs: stable   Complications: No apparent anesthesia complications

## 2014-09-07 NOTE — Transfer of Care (Signed)
Last Vitals:  Filed Vitals:   09/07/14 0957  BP: 130/83  Pulse: 109  Temp: 37.1 C  Resp: 12    Immediate Anesthesia Transfer of Care Note  Patient: Chelsea [name]  Procedure(s) Performed: Procedure(s) (LRB):  DILATATION AND CURETTAGE /HYSTEROSCOPY/POLYPECTOMY (N/A)  Patient Location: PACU  Anesthesia Type: General  Level of Consciousness: awake, alert  and oriented  Airway & Oxygen Therapy: Patient Spontanous Breathing and Patient connected to face mask oxygen  Post-op Assessment: Report given to PACU RN and Post -op Vital signs reviewed and stable  Post vital signs: Reviewed and stable  Complications: No apparent anesthesia complications

## 2014-09-07 NOTE — Discharge Instructions (Signed)

## 2014-09-07 NOTE — Anesthesia Procedure Notes (Signed)
Procedure Name: LMA Insertion Date/Time: 09/07/2014 12:39 PM Performed by: Norva PavlovALLAWAY, Chelsea Starlin G Pre-anesthesia Checklist: Patient identified, Emergency Drugs available, Suction available and Patient being monitored Patient Re-evaluated:Patient Re-evaluated prior to inductionOxygen Delivery Method: Circle System Utilized Preoxygenation: Pre-oxygenation with 100% oxygen Intubation Type: IV induction Ventilation: Mask ventilation without difficulty LMA: LMA inserted LMA Size: 4.0 Number of attempts: 1 Airway Equipment and Method: bite block Placement Confirmation: positive ETCO2 Tube secured with: Tape Dental Injury: Teeth and Oropharynx as per pre-operative assessment

## 2014-09-07 NOTE — Anesthesia Preprocedure Evaluation (Addendum)
Anesthesia Evaluation  Patient identified by MRN, date of birth, ID band Patient awake    Reviewed: Allergy & Precautions, NPO status , Patient's Chart, lab work & pertinent test results  History of Anesthesia Complications (+) PONV  Airway Mallampati: II  TM Distance: >3 FB Neck ROM: Full    Dental no notable dental hx.    Pulmonary neg pulmonary ROS,  breath sounds clear to auscultation  Pulmonary exam normal       Cardiovascular hypertension, Pt. on medications negative cardio ROS Normal cardiovascular examRhythm:Regular Rate:Normal     Neuro/Psych negative neurological ROS  negative psych ROS   GI/Hepatic negative GI ROS, Neg liver ROS,   Endo/Other  negative endocrine ROSMorbid obesity  Renal/GU negative Renal ROS  negative genitourinary   Musculoskeletal negative musculoskeletal ROS (+)   Abdominal   Peds negative pediatric ROS (+)  Hematology negative hematology ROS (+)   Anesthesia Other Findings   Reproductive/Obstetrics negative OB ROS                            Anesthesia Physical Anesthesia Plan  ASA: III  Anesthesia Plan: General   Post-op Pain Management:    Induction: Intravenous  Airway Management Planned: LMA  Additional Equipment:   Intra-op Plan:   Post-operative Plan: Extubation in OR  Informed Consent: I have reviewed the patients History and Physical, chart, labs and discussed the procedure including the risks, benefits and alternatives for the proposed anesthesia with the patient or authorized representative who has indicated his/her understanding and acceptance.   Dental advisory given  Plan Discussed with: CRNA  Anesthesia Plan Comments:         Anesthesia Quick Evaluation

## 2014-09-07 NOTE — Op Note (Signed)
OPERATIVE NOTE  Preoperative diagnosis: Endometrial polyp, abnormal uterine bleeding  Postoperative diagnosis:  Endometrial polyps, abnormal uterine bleeding  Procedure: Hysteroscopy, polypectomy,  D&C  Surgeon: Fermin SchwabYALCINKAYA,Samuel Rittenhouse  Anesthesia: General  Complications: None  Estimated blood loss: Less than 20 mL  Specimen: Endometrial curettings to pathology  Findings: Endocervical canal appeared normal. The uterus sounded to 8 cm. Endometrial cavity had 10 x 10 mm anterior fundal endometrial polyp to the left of the midline.  There is an right anterior lower uterine segment polyp which was smaller, 5 x 5 mm. In addition there was a midline lower uterine segment polypoid area which was 7 x 7 mm. All of these lesions were excised. In addition there were filmy bands of endometrial tissue to the left and right of midline at the fundus  which gave the appearance of filmy  adhesions. Otherwise it was of normal appearance and normal configuration. Both tubal ostia were seen.  There was a 2 x 2 millimeter left tubal ostial polyp.  Description of procedure: Patient was placed in dorsal supine position. General anesthesia was administered.  Prophylactic antibiotics were administered intravenously. She was placed in lithotomy position. She was prepped and draped in sterile manner. A vaginal speculum was placed. A dilute vasopressin solution containing 0.33 units per milliliter was injected into the cervical stroma x5 cc. A Slimline hysteroscope with 12 lens was inserted into the canal and above findings were noted. Distention medium was  Normal saline. Distention method was gravity. Above findings were noted.  Using hysteroscopic scissors the polyp was excised right at its stalk and then grasped with hysteroscopic graspers and removed and submitted to pathology.  The other smaller polyps were also excised and removed. Using a Handy Vak manual evacuation device attached 7 mm curette , we curetted the  endometrial cavity and confirmed with the hysteroscope that all the  Polyps and polypoid folds were removed. Remaining stalks were excised with scissors.  Hemostasis was insured. Instrument count was correct. Estimated blood loss was less than 20 mL. The patient tolerated the procedure well and was transferred to recovery in satisfactory condition.  Fermin SchwabYALCINKAYA,Sahira Cataldi

## 2014-09-12 ENCOUNTER — Encounter (HOSPITAL_BASED_OUTPATIENT_CLINIC_OR_DEPARTMENT_OTHER): Payer: Self-pay | Admitting: Obstetrics and Gynecology

## 2014-09-17 ENCOUNTER — Encounter: Payer: Self-pay | Admitting: Sports Medicine

## 2014-09-19 ENCOUNTER — Telehealth: Payer: Self-pay

## 2014-09-19 MED ORDER — MONTELUKAST SODIUM 10 MG PO TABS: 10.0000 mg | ORAL_TABLET | Freq: Every day | ORAL | Status: DC

## 2014-09-19 NOTE — Telephone Encounter (Signed)
Patient request refill for Singulair with a 90 dayu supply. #[phone] R sent to [facility]- [address] Sinclair Alligood,CMA

## 2014-09-26 ENCOUNTER — Other Ambulatory Visit: Payer: Self-pay | Admitting: Sports Medicine

## 2014-09-26 ENCOUNTER — Other Ambulatory Visit: Payer: Self-pay | Admitting: *Deleted

## 2014-09-26 ENCOUNTER — Telehealth: Payer: Self-pay

## 2014-09-26 ENCOUNTER — Encounter: Payer: Self-pay | Admitting: Sports Medicine

## 2014-09-26 DIAGNOSIS — I10 Essential (primary) hypertension: Secondary | ICD-10-CM

## 2014-09-26 DIAGNOSIS — M7918 Myalgia, other site: Secondary | ICD-10-CM

## 2014-09-26 MED ORDER — AMLODIPINE BESYLATE 10 MG PO TABS: 10.0000 mg | ORAL_TABLET | Freq: Every day | ORAL | Status: DC

## 2014-09-26 MED ORDER — RIZATRIPTAN BENZOATE 10 MG PO TABS: 10.0000 mg | ORAL_TABLET | ORAL | Status: DC | PRN

## 2014-09-26 MED ORDER — TOPIRAMATE 25 MG PO TABS: ORAL_TABLET | ORAL | Status: DC

## 2014-09-26 MED ORDER — HYDROCHLOROTHIAZIDE 25 MG PO TABS: 25.0000 mg | ORAL_TABLET | Freq: Every day | ORAL | Status: DC

## 2014-09-26 MED ORDER — METFORMIN HCL ER (MOD) 1000 MG PO TB24: 1000.0000 mg | ORAL_TABLET | Freq: Two times a day (BID) | ORAL | Status: DC

## 2014-09-26 MED ORDER — DULOXETINE HCL 60 MG PO CPEP: 120.0000 mg | ORAL_CAPSULE | Freq: Every day | ORAL | Status: DC

## 2014-09-26 MED ORDER — METFORMIN HCL ER (OSM) 1000 MG PO TB24: 2000.0000 mg | ORAL_TABLET | Freq: Every day | ORAL | Status: DC

## 2014-09-26 MED ORDER — METFORMIN HCL ER 500 MG PO TB24: 2000.0000 mg | ORAL_TABLET | Freq: Every day | ORAL | Status: DC

## 2014-09-26 NOTE — Telephone Encounter (Signed)
PATIENT REQUEST A 90 DAY SUPPLY OF HER MEDICATION SENT TO [facility]. Chelsea [name],CMA

## 2014-10-29 ENCOUNTER — Encounter: Payer: Self-pay | Admitting: Emergency Medicine

## 2014-10-29 ENCOUNTER — Emergency Department (INDEPENDENT_AMBULATORY_CARE_PROVIDER_SITE_OTHER)
Admission: EM | Admit: 2014-10-29 | Discharge: 2014-10-29 | Disposition: A | Discharge status: Home / Self Care | Payer: BLUE CROSS/BLUE SHIELD | Source: Home / Self Care | Attending: Family Medicine | Admitting: Family Medicine

## 2014-10-29 DIAGNOSIS — B370 Candidal stomatitis: Secondary | ICD-10-CM | POA: Diagnosis not present

## 2014-10-29 DIAGNOSIS — J029 Acute pharyngitis, unspecified: Secondary | ICD-10-CM | POA: Diagnosis not present

## 2014-10-29 LAB — POCT RAPID STREP A (OFFICE): Rapid Strep A Screen: NEGATIVE

## 2014-10-29 MED ORDER — FLUCONAZOLE 200 MG PO TABS: 200.0000 mg | ORAL_TABLET | Freq: Every day | ORAL | Status: DC

## 2014-10-29 NOTE — ED Notes (Signed)
Pt c/o white patches on throat and some tenderness. States she feels like something is stuck in her throat x3 days.

## 2014-10-29 NOTE — Discharge Instructions (Signed)
Please speak with your PCP or OB/GYN prior to starting Fluconazole if you are currently attempting to become pregnant.  It is unknown if this medication can cause birth defects. Your PCP or OB/GYN may have other treatment options to offer.

## 2014-10-29 NOTE — ED Provider Notes (Signed)
CSN: [id number]     Arrival date & time 10/29/14  1037 History   First MD Initiated Contact with Patient 10/29/14 1039     Chief Complaint  Patient presents with  . Sore Throat   (Consider location/radiation/quality/duration/timing/severity/associated sxs/prior Treatment) HPI Pt is a 29yo female with hx of PCOS currently attempting to become pregnant, presenting to [facility] with c/o 3 day hx of sore throat and "heart burn" that has gradually worsened.  Pain is 0/10 at this time but is mild to moderate in severity throughout the day, worse with swallowing. Pt states it feels like something is stuck in her throat.  She has tried tylenol as well as her acid reflux medication w/o relief.  Reports subjective "low grade" fever. Denies cough, congestion, n/v/d.  Denies recent use of steroids including prednisone or inhaled steroids.  Denies sick contacts or recent travel.  Past Medical History  Diagnosis Date  . Hypertension   . PCOS (polycystic ovarian syndrome)   . Endometrial polyp   . History of recurrent UTIs   . PONV (postoperative nausea and vomiting)   . Seasonal allergic rhinitis   . Myofascial muscle pain   . OA (osteoarthritis) of knee     BILATERAL   Past Surgical History  Procedure Laterality Date  . I & d right foot  1998  . Tonsillectomy  2001  . Wisdom tooth extraction  2004  . Hysteroscopy w/d&c N/A 09/07/2014    Procedure:  DILATATION AND CURETTAGE /HYSTEROSCOPY/POLYPECTOMY;  Surgeon: Fermin Schwab, MD;  Location: [facility];  Service: Gynecology;  Laterality: N/A;   Family History  Problem Relation Age of Onset  . Diabetes Mother   . Polycystic ovary syndrome Mother   . Hypertension Father   . Diabetes Father   . Fibromyalgia Maternal Aunt   . Fibromyalgia Cousin    Social History  Substance Use Topics  . Smoking status: Never Smoker   . Smokeless tobacco: Never Used  . Alcohol Use: No   OB History    No data available     Review of Systems   Constitutional: Positive for fever ( subjective "low grade"). Negative for chills.  HENT: Positive for ear pain and sore throat. Negative for congestion, postnasal drip, rhinorrhea, trouble swallowing and voice change.   Respiratory: Negative for cough and shortness of breath.   Cardiovascular: Positive for chest pain ( "heart burn"). Negative for palpitations and leg swelling.  Gastrointestinal: Negative for nausea, vomiting, abdominal pain and diarrhea.  Musculoskeletal: Negative for myalgias and arthralgias.    Allergies  Amoxicillin; Ceclor; Enalapril maleate; Erythromycin; and Penicillins  Home Medications   Prior to Admission medications   Medication Sig Start Date End Date Taking? Authorizing Provider  amLODipine (NORVASC) 10 MG tablet Take 1 tablet (10 mg total) by mouth daily. 09/26/14   Monica Becton, MD  Biotin 1 MG CAPS Take 1 capsule by mouth 2 (two) times daily.     Historical Provider, MD  cetirizine (ZYRTEC) 10 MG tablet Take 10 mg by mouth every morning.    Historical Provider, MD  cyclobenzaprine (FLEXERIL) 10 MG tablet TAKE 1/2 TABLET BY MOUTH AT BEDTIME, THEN GRADUALLY INCREASE DOSE TO 1 TABLET THREE TIMES DAILY Patient taking differently: TAKE 1/2 TABLET BY MOUTH AT BEDTIME, THEN GRADUALLY INCREASE DOSE TO 1 TABLET THREE TIMES DAILY---   PT TAKES PRN 04/26/14   Monica Becton, MD  DULoxetine (CYMBALTA) 60 MG capsule Take 2 capsules (120 mg total) by mouth daily. 09/26/14  Monica Becton, MD  fluconazole (DIFLUCAN) 200 MG tablet Take 1 tablet (200 mg total) by mouth daily. For 14 days 10/29/14   Junius Finner, PA-C  hydrochlorothiazide (HYDRODIURIL) 25 MG tablet Take 1 tablet (25 mg total) by mouth daily. 09/26/14   Monica Becton, MD  Liraglutide -Weight Management (SAXENDA) 18 MG/3ML SOPN Inject 3 mg into the skin daily. 0.6 mg injected subcutaneously daily for 1 week, then increase by 0.6 mg weekly until reaching 3 mg injected subcutaneous daily  07/19/14   Monica Becton, MD  metFORMIN (GLUCOPHAGE XR) 500 MG 24 hr tablet Take 4 tablets (2,000 mg total) by mouth daily with breakfast. 09/26/14   Monica Becton, MD  montelukast (SINGULAIR) 10 MG tablet Take 1 tablet (10 mg total) by mouth at bedtime. 09/19/14   Monica Becton, MD  nitrofurantoin, macrocrystal-monohydrate, (MACROBID) 100 MG capsule Take 1 capsule (100 mg total) by mouth 2 (two) times daily. 1 po BId 08/26/14   Mary-Margaret Daphine Deutscher, FNP  rizatriptan (MAXALT) 10 MG tablet Take 1 tablet (10 mg total) by mouth as needed for migraine. May repeat in 2 hours if needed 09/26/14   Monica Becton, MD  topiramate (TOPAMAX) 25 MG tablet Take one tablet at bedtime for 1 week then increase to 2 tablets at bedtime for 1 week then increase to 1 tablet in am and 2 tablets in pm for 1 week, then increase to 2 tablets am and pm for migraine prevention. 09/26/14   Monica Becton, MD  valACYclovir (VALTREX) 1000 MG tablet Take 1 tablet (1,000 mg total) by mouth 2 (two) times daily. Patient taking differently: Take 1,000 mg by mouth daily as needed.  03/08/14   Monica Becton, MD  vitamin C (ASCORBIC ACID) 500 MG tablet Take 500 mg by mouth 2 (two) times daily.    Historical Provider, MD   Meds Ordered and Administered this Visit  Medications - No data to display  BP 132/86 mmHg  Pulse 96  Temp(Src) 98.3 F (36.8 C) (Oral)  SpO2 98%  LMP 10/12/2014 No data found.   Physical Exam  Constitutional: She appears well-developed and well-nourished. No distress.  Obese female sitting on exam table, NAD  HENT:  Head: Normocephalic and atraumatic.  Right Ear: Hearing, tympanic membrane, external ear and ear canal normal.  Left Ear: Hearing, tympanic membrane, external ear and ear canal normal.  Nose: Nose normal.  Mouth/Throat: Uvula is midline and mucous membranes are normal. Oropharyngeal exudate and posterior oropharyngeal erythema present. No posterior  oropharyngeal edema or tonsillar abscesses.  Oropharyngeal erythema with diffuse white patches on posterior pharynx, uvula and tonsils. C/w candidiasis   Eyes: Conjunctivae are normal. No scleral icterus.  Neck: Normal range of motion. Neck supple.  Cardiovascular: Normal rate, regular rhythm and normal heart sounds.   Pulmonary/Chest: Effort normal and breath sounds normal. No respiratory distress. She has no wheezes. She has no rales. She exhibits no tenderness.  Abdominal: Soft. She exhibits no distension and no mass. There is no tenderness. There is no rebound and no guarding.  Musculoskeletal: Normal range of motion.  Lymphadenopathy:    She has no cervical adenopathy.  Neurological: She is alert.  Skin: Skin is warm and dry. She is not diaphoretic.  Nursing note and vitals reviewed.   ED Course  Procedures (including critical care time)  Labs Review Labs Reviewed  POCT RAPID STREP A (OFFICE)    Imaging Review No results found.      MDM  1. Sore throat   2. Oral pharyngeal candidiasis    Pt c/o sore throat and white patches in back of throat. Pt appears well, non-toxic, is afebrile. No respiratory distress. Rapid strep: negative Exam more c/w oral pharyngeal candidiasis.  Pt notes she is attempting to become pregnant. Offered Urine pregnancy test in UC. Pt declined as pt and her mother stated it is "too early"  Advised pt tx is Fluconazole for at least 2 weeks. Advised pt of unknown risks to fetus during pregnancy as medication is Pregnancy Cat. 'C'  Advised pt to discuss treatment with PCP and/or OB/GYN prior to starting.  Patient verbalized understanding and agreement with treatment plan.     Junius Finner, PA-C 10/29/14 1116

## 2014-11-01 ENCOUNTER — Other Ambulatory Visit: Payer: Self-pay | Admitting: *Deleted

## 2014-11-01 MED ORDER — METFORMIN HCL ER (OSM) 1000 MG PO TB24: 2000.0000 mg | ORAL_TABLET | Freq: Every day | ORAL | Status: DC

## 2014-11-03 ENCOUNTER — Other Ambulatory Visit: Payer: Self-pay | Admitting: Sports Medicine

## 2014-11-03 DIAGNOSIS — Z3009 Encounter for other general counseling and advice on contraception: Secondary | ICD-10-CM | POA: Insufficient documentation

## 2014-11-03 NOTE — Assessment & Plan Note (Signed)
Has now had 2 cycles IUI, checking serum pregnancy.

## 2014-11-04 LAB — HCG, QUANTITATIVE, PREGNANCY: hCG, Beta Chain, Quant, S: <2 m[IU]/mL

## 2014-11-22 ENCOUNTER — Telehealth: Payer: Self-pay | Admitting: Sports Medicine

## 2014-11-22 NOTE — Telephone Encounter (Signed)
Received fax for prior authorization on Saxenda sent through cover my meds waiting on authorization. - CF °

## 2014-11-24 ENCOUNTER — Other Ambulatory Visit: Payer: Self-pay | Admitting: Sports Medicine

## 2014-11-24 NOTE — Telephone Encounter (Signed)
Received fax from [facility] on Saxenda and medication is approved from 11/22/2014 - 03/27/2015. Reference # [id number]. Pharmacy has been notified - CF

## 2015-01-30 ENCOUNTER — Other Ambulatory Visit: Payer: Self-pay | Admitting: Sports Medicine

## 2015-03-23 ENCOUNTER — Encounter: Payer: Self-pay | Admitting: Sports Medicine

## 2015-03-23 ENCOUNTER — Ambulatory Visit (INDEPENDENT_AMBULATORY_CARE_PROVIDER_SITE_OTHER): Payer: BLUE CROSS/BLUE SHIELD | Admitting: Sports Medicine

## 2015-03-23 VITALS — BP 172/102 | HR 102 | Temp 98.0°F | Resp 18 | Wt 335.9 lb

## 2015-03-23 DIAGNOSIS — M791 Myalgia: Secondary | ICD-10-CM | POA: Diagnosis not present

## 2015-03-23 DIAGNOSIS — M2240 Chondromalacia patellae, unspecified knee: Secondary | ICD-10-CM

## 2015-03-23 DIAGNOSIS — M7918 Myalgia, other site: Secondary | ICD-10-CM

## 2015-03-23 DIAGNOSIS — I10 Essential (primary) hypertension: Secondary | ICD-10-CM | POA: Diagnosis not present

## 2015-03-23 DIAGNOSIS — Z0000 Encounter for general adult medical examination without abnormal findings: Secondary | ICD-10-CM | POA: Diagnosis not present

## 2015-03-23 MED ORDER — DULOXETINE HCL 60 MG PO CPEP: 120.0000 mg | ORAL_CAPSULE | Freq: Every day | ORAL | Status: DC

## 2015-03-23 NOTE — Progress Notes (Signed)
  Subjective:    CC: Complete physical exam  HPI:  This is a pleasant 30 year old phenol here for complete physical, she is up-to-date on screening measures, Pap smear was 1 year ago, has not yet had routine blood work in over a year.  Hypertension: Elevated however was recently in the hospital and off of blood pressure medications for a D&C. No headaches, visual changes, chest pain.  Myofascial pain syndrome: Currently trying to get pregnant, on Cymbalta 60, wonders what else can be done to help the pain.  Polycystic ovarian disease: Agreeable to recheck testosterone levels. Currently on metformin. Is working with reproductive endocrinology as well.  Left knee pain: Pain is severe, persistent and localized to the medial joint line, with difficulty straightening out the knee. An injection over a year ago provided a good response and she would like a second one.   Past medical history, Surgical history, Family history not pertinant except as noted below, Social history, Allergies, and medications have been entered into the medical record, reviewed, and no changes needed.   Review of Systems: No headache, visual changes, nausea, vomiting, diarrhea, constipation, dizziness, abdominal pain, skin rash, fevers, chills, night sweats, swollen lymph nodes, weight loss, chest pain, body aches, joint swelling, muscle aches, shortness of breath, mood changes, visual or auditory hallucinations.  Objective:    General: Well Developed, well nourished, and in no acute distress.  Neuro: Alert and oriented x3, extra-ocular muscles intact, sensation grossly intact. Cranial nerves II through XII are intact, motor, sensory, and coordinative functions are all intact. HEENT: Normocephalic, atraumatic, pupils equal round reactive to light, neck supple, no masses, no lymphadenopathy, thyroid nonpalpable. Oropharynx, nasopharynx, external ear canals are unremarkable. Skin: Warm and dry, no rashes noted.  Cardiac:  Regular rate and rhythm, no murmurs rubs or gallops.  Respiratory: Clear to auscultation bilaterally. Not using accessory muscles, speaking in full sentences.  Abdominal: Soft, nontender, nondistended, positive bowel sounds, no masses, no organomegaly.  Left Knee: Visible and palpable effusion that is mild, tenderness at the medial joint line. Lacks approximately 5 of extension.  Non painful patellar compression. Patellar and quadriceps tendons unremarkable. Hamstring and quadriceps strength is normal.  Procedure: Real-time Ultrasound Guided Injection of left knee Device: GE Logiq E  Verbal informed consent obtained.  Time-out conducted.  Noted no overlying erythema, induration, or other signs of local infection.  Skin prepped in a sterile fashion.  Local anesthesia: Topical Ethyl chloride.  With sterile technique and under real time ultrasound guidance:  2 mL Kenalog 40, 2 mL lidocaine, 2 mL Marcaine injected easily into the suprapatellar recess with a 25-gauge needle. Completed without difficulty  Pain immediately resolved suggesting accurate placement of the medication.  Advised to call if fevers/chills, erythema, induration, drainage, or persistent bleeding.  Images permanently stored and available for review in the ultrasound unit.  Impression: Technically successful ultrasound guided injection.  Impression and Recommendations:    The patient was counselled, risk factors were discussed, anticipatory guidance given.

## 2015-03-23 NOTE — Assessment & Plan Note (Signed)
Elevated but no headaches, visual changes, chest pain. Has been off of blood pressure medications. Return in 2 weeks to recheck.

## 2015-03-23 NOTE — Assessment & Plan Note (Signed)
Previous injection was over a year ago, repeat left knee injection. Return in one month.

## 2015-03-23 NOTE — Assessment & Plan Note (Signed)
Complete physical as above , checking routine blood work. 

## 2015-03-23 NOTE — Assessment & Plan Note (Signed)
Increasing Cymbalta. Gabapentin and Lyrica are high risk during pregnancy.

## 2015-03-24 LAB — LIPID PANEL
Cholesterol: 154 mg/dL (ref 125–200)
HDL: 35 mg/dL — ABNORMAL LOW (ref >=46)
LDL Cholesterol: 94 mg/dL (ref <130)
Total CHOL/HDL Ratio: 4.4 ratio (ref <=5.0)
Triglycerides: 127 mg/dL (ref <150)
VLDL: 25 mg/dL (ref <30)

## 2015-03-24 LAB — COMPREHENSIVE METABOLIC PANEL WITH GFR
AST: 20 U/L (ref 10–30)
BUN: 10 mg/dL (ref 7–25)
CO2: 28 mmol/L (ref 20–31)
Calcium: 8.4 mg/dL — ABNORMAL LOW (ref 8.6–10.2)
Glucose, Bld: 83 mg/dL (ref 65–99)
Total Bilirubin: 0.3 mg/dL (ref 0.2–1.2)

## 2015-03-24 LAB — CBC
HCT: 37 % (ref 36.0–46.0)
Hemoglobin: 11.8 g/dL — ABNORMAL LOW (ref 12.0–15.0)
MCH: 23.3 pg — ABNORMAL LOW (ref 26.0–34.0)
MCHC: 31.9 g/dL (ref 30.0–36.0)
MCV: 73.1 fL — ABNORMAL LOW (ref 78.0–100.0)
MPV: 10.5 fL (ref 8.6–12.4)
Platelets: 271 10*3/uL (ref 150–400)
RBC: 5.06 MIL/uL (ref 3.87–5.11)
RDW: 17.2 % — ABNORMAL HIGH (ref 11.5–15.5)
WBC: 11.3 K/uL — ABNORMAL HIGH (ref 4.0–10.5)

## 2015-03-24 LAB — COMPREHENSIVE METABOLIC PANEL
ALT: 24 U/L (ref 6–29)
Albumin: 3.7 g/dL (ref 3.6–5.1)
Alkaline Phosphatase: 79 U/L (ref 33–115)
Chloride: 104 mmol/L (ref 98–110)
Creat: 0.65 mg/dL (ref 0.50–1.10)
Potassium: 4 mmol/L (ref 3.5–5.3)
Sodium: 141 mmol/L (ref 135–146)
Total Protein: 6.5 g/dL (ref 6.1–8.1)

## 2015-03-24 LAB — HEMOGLOBIN A1C
Hgb A1c MFr Bld: 6.3 % — ABNORMAL HIGH (ref <5.7)
Mean Plasma Glucose: 134 mg/dL — ABNORMAL HIGH (ref <117)

## 2015-03-24 LAB — TESTOSTERONE, FREE, TOTAL, SHBG
Sex Hormone Binding: 19 nmol/L (ref 17–124)
Testosterone, Free: 11.5 pg/mL — ABNORMAL HIGH (ref 0.6–6.8)
Testosterone-% Free: 2.4 % (ref 0.4–2.4)
Testosterone: 48 ng/dL

## 2015-03-24 LAB — TSH: TSH: 1.841 u[IU]/mL (ref 0.350–4.500)

## 2015-03-28 LAB — TESTOS,TOTAL,FREE AND SHBG (FEMALE)
Sex Hormone Binding Glob.: 19 nmol/L (ref 17–124)
Testosterone, Free: 4.8 pg/mL (ref 0.1–6.4)
Testosterone,Total,LC/MS/MS: 32 ng/dL (ref 2–45)

## 2015-03-29 ENCOUNTER — Encounter: Payer: Self-pay | Admitting: Sports Medicine

## 2015-03-30 ENCOUNTER — Other Ambulatory Visit: Payer: Self-pay | Admitting: Sports Medicine

## 2015-03-30 DIAGNOSIS — M7918 Myalgia, other site: Secondary | ICD-10-CM

## 2015-03-30 MED ORDER — CYCLOBENZAPRINE HCL 10 MG PO TABS
ORAL_TABLET | ORAL | Status: DC
Start: 2015-03-30 — End: 2015-08-24

## 2015-03-30 NOTE — Assessment & Plan Note (Signed)
Continue Cymbalta at  daily, gabapentin and Lyrica, and all other anticonvulsants are contraindicated in high risk during pregnancy. Flexeril is safe. Adding this.

## 2015-04-03 ENCOUNTER — Encounter: Payer: Self-pay | Admitting: Sports Medicine

## 2015-04-03 ENCOUNTER — Ambulatory Visit (INDEPENDENT_AMBULATORY_CARE_PROVIDER_SITE_OTHER): Payer: BLUE CROSS/BLUE SHIELD | Admitting: Sports Medicine

## 2015-04-03 VITALS — BP 152/102 | HR 114 | Temp 98.2°F | Resp 18 | Wt 329.4 lb

## 2015-04-03 DIAGNOSIS — J069 Acute upper respiratory infection, unspecified: Secondary | ICD-10-CM | POA: Insufficient documentation

## 2015-04-03 DIAGNOSIS — I10 Essential (primary) hypertension: Secondary | ICD-10-CM | POA: Diagnosis not present

## 2015-04-03 MED ORDER — CHLORTHALIDONE 100 MG PO TABS: 100.0000 mg | ORAL_TABLET | Freq: Every day | ORAL | Status: DC

## 2015-04-03 NOTE — Assessment & Plan Note (Signed)
Small shallow ulcer in the oropharynx, no overt exudates. This likely is a viral process, negative rapid strep test. Recommended over-the-counter symptomatically care.

## 2015-04-03 NOTE — Progress Notes (Signed)
  Subjective:    CC: "I'm worried I have strep throat"  HPI: Patient presents with a 2d history of sore throat and neck pain associated with mild nasal congestion. She has not been around anybody that is sick. She says that the symptoms have been worsening since they began 2 days ago. She has not had a cough or SOB. She says that her mother said the patient was "cold and clammy." She denies generalized aches or fevers. She denies a headache. Her neck pain is located by her throat and around her right ear. No ear pain or tinnitus. The pain does not radiate. Patient reports seeing white spots in the back of her throat and is concerned that she has strep. Her friends just had a baby and she wanted to get checked out before spending time with the baby.  Past medical history, Surgical history, Family history not pertinant except as noted below, Social history, Allergies, and medications have been entered into the medical record, reviewed, and no changes needed.   Review of Systems: No fevers, chills, night sweats, weight loss, chest pain, or shortness of breath.   Objective:    General: Well Developed, well nourished, and in no acute distress.  Neuro: Alert and oriented x3, extra-ocular muscles intact, sensation grossly intact.  HEENT: Normocephalic, atraumatic, pupils equal round reactive to light, neck supple and diffusely tender, no masses, bilateral lymphadenopathy, thyroid nonpalpable, TMs clear bilaterally and without bulge, oropharyngeal erythema noted with no exudates.  Skin: Warm and dry, no rashes. Cardiac: Regular rate and rhythm, no murmurs rubs or gallops, no lower extremity edema.  Respiratory: Clear to auscultation bilaterally. Not using accessory muscles, speaking in full sentences.   Impression and Recommendations:    Patient's presentation is consistent with an URI given symptoms. Patient has 2/4 CENTOR (tender adenopathy and absence of cough) criteria so a rapid strep test will be  done.   UPDATE: strep test was negative. Patient instructed in supportive care such as staying hydrated, plenty or rest, and OTC care as needed.  Patient has had continued HTN on amlodipine and HCTZ. Will switch HCTZ to chlorthalidone and recheck in 2 weeks.

## 2015-04-03 NOTE — Assessment & Plan Note (Signed)
Switching from HCTZ to chlorthalidone.

## 2015-04-03 NOTE — Patient Instructions (Signed)
Upper Respiratory Infection, Adult Most upper respiratory infections (URIs) are a viral infection of the air passages leading to the lungs. A URI affects the nose, throat, and upper air passages. The most common type of URI is nasopharyngitis and is typically referred to as "the common cold." URIs run their course and usually go away on their own. Most of the time, a URI does not require medical attention, but sometimes a bacterial infection in the upper airways can follow a viral infection. This is called a secondary infection. Sinus and middle ear infections are common types of secondary upper respiratory infections. Bacterial pneumonia can also complicate a URI. A URI can worsen asthma and chronic obstructive pulmonary disease (COPD). Sometimes, these complications can require emergency medical care and may be life threatening.  CAUSES Almost all URIs are caused by viruses. A virus is a type of germ and can spread from one person to another.  RISKS FACTORS You may be at risk for a URI if:   You smoke.   You have chronic heart or lung disease.  You have a weakened defense (immune) system.   You are very young or very old.   You have nasal allergies or asthma.  You work in crowded or poorly ventilated areas.  You work in health care facilities or schools. SIGNS AND SYMPTOMS  Symptoms typically develop 2-3 days after you come in contact with a cold virus. Most viral URIs last 7-10 days. However, viral URIs from the influenza virus (flu virus) can last 14-18 days and are typically more severe. Symptoms may include:   Runny or stuffy (congested) nose.   Sneezing.   Cough.   Sore throat.   Headache.   Fatigue.   Fever.   Loss of appetite.   Pain in your forehead, behind your eyes, and over your cheekbones (sinus pain).  Muscle aches.  DIAGNOSIS  Your health care provider may diagnose a URI by:  Physical exam.  Tests to check that your symptoms are not due to  another condition such as:  Strep throat.  Sinusitis.  Pneumonia.  Asthma. TREATMENT  A URI goes away on its own with time. It cannot be cured with medicines, but medicines may be prescribed or recommended to relieve symptoms. Medicines may help:  Reduce your fever.  Reduce your cough.  Relieve nasal congestion. HOME CARE INSTRUCTIONS   Take medicines only as directed by your health care provider.   Gargle warm saltwater or take cough drops to comfort your throat as directed by your health care provider.  Use a warm mist humidifier or inhale steam from a shower to increase air moisture. This may make it easier to breathe.  Drink enough fluid to keep your urine clear or pale yellow.   Eat soups and other clear broths and maintain good nutrition.   Rest as needed.   Return to work when your temperature has returned to normal or as your health care provider advises. You may need to stay home longer to avoid infecting others. You can also use a face mask and careful hand washing to prevent spread of the virus.  Increase the usage of your inhaler if you have asthma.   Do not use any tobacco products, including cigarettes, chewing tobacco, or electronic cigarettes. If you need help quitting, ask your health care provider. PREVENTION  The best way to protect yourself from getting a cold is to practice good hygiene.   Avoid oral or hand contact with people with cold   symptoms.   Wash your hands often if contact occurs.  There is no clear evidence that vitamin C, vitamin E, echinacea, or exercise reduces the chance of developing a cold. However, it is always recommended to get plenty of rest, exercise, and practice good nutrition.  SEEK MEDICAL CARE IF:   You are getting worse rather than better.   Your symptoms are not controlled by medicine.   You have chills.  You have worsening shortness of breath.  You have brown or red mucus.  You have yellow or brown nasal  discharge.  You have pain in your face, especially when you bend forward.  You have a fever.  You have swollen neck glands.  You have pain while swallowing.  You have white areas in the back of your throat. SEEK IMMEDIATE MEDICAL CARE IF:   You have severe or persistent:  Headache.  Ear pain.  Sinus pain.  Chest pain.  You have chronic lung disease and any of the following:  Wheezing.  Prolonged cough.  Coughing up blood.  A change in your usual mucus.  You have a stiff neck.  You have changes in your:  Vision.  Hearing.  Thinking.  Mood. MAKE SURE YOU:   Understand these instructions.  Will watch your condition.  Will get help right away if you are not doing well or get worse.   This information is not intended to replace advice given to you by your health care provider. Make sure you discuss any questions you have with your health care provider.   Document Released: 08/07/2000 Document Revised: 06/28/2014 Document Reviewed: 05/19/2013 Elsevier Interactive Patient Education 2016 [employer]  

## 2015-04-04 ENCOUNTER — Telehealth: Payer: Self-pay

## 2015-04-04 ENCOUNTER — Encounter: Payer: Self-pay | Admitting: Sports Medicine

## 2015-04-04 DIAGNOSIS — I10 Essential (primary) hypertension: Secondary | ICD-10-CM

## 2015-04-04 MED ORDER — CHLORTHALIDONE 100 MG PO TABS: 100.0000 mg | ORAL_TABLET | Freq: Every day | ORAL | Status: DC

## 2015-04-04 NOTE — Telephone Encounter (Signed)
Called pt to inform her that her pharmacy does not carry rx written for blood pressure and it is the medication Dr. Benjamin Stain prefers her to take. Asked if there is another pharmacy she prefers. Medication sent to [facility].

## 2015-04-07 ENCOUNTER — Ambulatory Visit: Payer: BLUE CROSS/BLUE SHIELD | Admitting: Sports Medicine

## 2015-04-11 ENCOUNTER — Ambulatory Visit (INDEPENDENT_AMBULATORY_CARE_PROVIDER_SITE_OTHER): Payer: BLUE CROSS/BLUE SHIELD | Admitting: Obstetrics & Gynecology

## 2015-04-11 ENCOUNTER — Encounter: Payer: Self-pay | Admitting: Obstetrics & Gynecology

## 2015-04-11 VITALS — BP 200/106 | HR 104 | Resp 17 | Ht 69.0 in | Wt 335.0 lb

## 2015-04-11 DIAGNOSIS — Z124 Encounter for screening for malignant neoplasm of cervix: Secondary | ICD-10-CM | POA: Diagnosis not present

## 2015-04-11 DIAGNOSIS — Z01419 Encounter for gynecological examination (general) (routine) without abnormal findings: Secondary | ICD-10-CM

## 2015-04-11 NOTE — Progress Notes (Signed)
Subjective:    Chelsea [name] is a 30 y.o. S W G0  female who presents for an annual exam. The patient has no complaints today. She is currently being evaluated by Dr. April Manson for IVF and uterine polyps. The patient is sexually active with her same sex partner.  GYN screening history: last pap: was normal. The patient wears seatbelts: yes. The patient participates in regular exercise: no. Has the patient ever been transfused or tattooed?: no. The patient reports that there is not domestic violence in her life.   Menstrual History: OB History    Gravida Para Term Preterm AB TAB SAB Ectopic Multiple Living        Menarche age: 44  Patient's last menstrual period was 03/29/2015.    The following portions of the patient's history were reviewed and updated as appropriate: allergies, current medications, past family history, past medical history, past social history, past surgical history and problem list.  Review of Systems Pertinent items are noted in HPI.  Self- employeed, planning for disability due to fibromyalgia/arthritis. Declines a flu vaccine. Lives with her parents and partner. Periods are monthly after losing some weight.   Objective:    BP 200/106 mmHg  Pulse 104  Resp 17  Ht  (1.753 m)  Wt 335 lb (151.955 kg)  BMI 49.45 kg/m2  LMP 03/29/2015  General Appearance:    Alert, cooperative, no distress, appears stated age  Head:    Normocephalic, without obvious abnormality, atraumatic  Eyes:    PERRL, conjunctiva/corneas clear, EOM's intact, fundi    benign, both eyes  Ears:    Normal TM's and external ear canals, both ears  Nose:   Nares normal, septum midline, mucosa normal, no drainage    or sinus tenderness  Throat:   Lips, mucosa, and tongue normal; teeth and gums normal  Neck:   Supple, symmetrical, trachea midline, no adenopathy;    thyroid:  no enlargement/tenderness/nodules; no carotid   bruit or JVD  Back:     Symmetric, no curvature,  ROM normal, no CVA tenderness  Lungs:     Clear to auscultation bilaterally, respirations unlabored  Chest Wall:    No tenderness or deformity   Heart:    Regular rate and rhythm, S1 and S2 normal, no murmur, rub   or gallop  Breast Exam:    No tenderness, masses, or nipple abnormality  Abdomen:     Soft, non-tender, bowel sounds active all four quadrants,    no masses, no organomegaly, morbidly obese  Genitalia:    Normal female without lesion, discharge or tenderness, normal appearing cervix     Extremities:   Extremities normal, atraumatic, no cyanosis or edema  Pulses:   2+ and symmetric all extremities  Skin:   Skin color, texture, turgor normal, no rashes or lesions  Lymph nodes:   Cervical, supraclavicular, and axillary nodes normal  Neurologic:   CNII-XII intact, normal strength, sensation and reflexes    throughout  .    Assessment:    Healthy female exam.    Plan:     Thin prep Pap smear.   Rec weight loss

## 2015-04-17 ENCOUNTER — Ambulatory Visit (INDEPENDENT_AMBULATORY_CARE_PROVIDER_SITE_OTHER): Payer: BLUE CROSS/BLUE SHIELD | Admitting: Sports Medicine

## 2015-04-17 ENCOUNTER — Encounter: Payer: Self-pay | Admitting: Sports Medicine

## 2015-04-17 VITALS — BP 148/96 | HR 114 | Resp 18 | Wt 324.0 lb

## 2015-04-17 DIAGNOSIS — I10 Essential (primary) hypertension: Secondary | ICD-10-CM

## 2015-04-17 NOTE — Progress Notes (Signed)
  Subjective:    CC: Blood pressure check  HPI: Hypertension: Improved on chlorthalidone, continues with amlodipine max dose. Early undergoing in vitro fertilization so we are unable to use an ACE, ARB, or beta blocker. No headaches, visual blurriness has improved with improvement in blood pressure. No chest pain.  Past medical history, Surgical history, Family history not pertinant except as noted below, Social history, Allergies, and medications have been entered into the medical record, reviewed, and no changes needed.   Review of Systems: No fevers, chills, night sweats, weight loss, chest pain, or shortness of breath.   Objective:    General: Well Developed, well nourished, and in no acute distress.  Neuro: Alert and oriented x3, extra-ocular muscles intact, sensation grossly intact.  HEENT: Normocephalic, atraumatic, pupils equal round reactive to light, neck supple, no masses, no lymphadenopathy, thyroid nonpalpable.  Skin: Warm and dry, no rashes. Cardiac: Regular rate and rhythm, no murmurs rubs or gallops, no lower extremity edema.  Respiratory: Clear to auscultation bilaterally. Not using accessory muscles, speaking in full sentences.  Impression and Recommendations:

## 2015-04-17 NOTE — Assessment & Plan Note (Signed)
Continue amlodipine max dose and chlorthalidone, only doing half of the recommended dose of chlorthalidone for now, blood pressure still elevated. Ideally we would add a beta blocker however she is currently undergoing IVF, and a beta blocker would be contraindicated during pregnancy.

## 2015-04-19 LAB — CYTOLOGY - PAP

## 2015-05-11 ENCOUNTER — Ambulatory Visit: Payer: BLUE CROSS/BLUE SHIELD | Admitting: Sports Medicine

## 2015-05-15 ENCOUNTER — Ambulatory Visit (INDEPENDENT_AMBULATORY_CARE_PROVIDER_SITE_OTHER): Payer: BLUE CROSS/BLUE SHIELD | Admitting: Sports Medicine

## 2015-05-15 ENCOUNTER — Encounter: Payer: Self-pay | Admitting: Sports Medicine

## 2015-05-15 DIAGNOSIS — I10 Essential (primary) hypertension: Secondary | ICD-10-CM

## 2015-05-15 MED ORDER — AMLODIPINE BESYLATE 10 MG PO TABS: 10.0000 mg | ORAL_TABLET | Freq: Every day | ORAL | Status: DC

## 2015-05-15 MED ORDER — HYDROCHLOROTHIAZIDE 50 MG PO TABS: 50.0000 mg | ORAL_TABLET | Freq: Every day | ORAL | Status: DC

## 2015-05-15 NOTE — Assessment & Plan Note (Signed)
Currently well controlled, however chlorthalidone is too expensive. Switching back to hydrochlorothiazide 50 mg daily, and amlodipine 10 mg daily.

## 2015-05-15 NOTE — Progress Notes (Signed)
  Subjective:    CC: follow-up  HPI: Hypertension: Well controlled with chlorthalidone and amlodipine. No headaches, visual changes, chest pain. Unfortunately chlorthalidone is now too expensive, she desires to go back to HCTZ.  Past medical history, Surgical history, Family history not pertinant except as noted below, Social history, Allergies, and medications have been entered into the medical record, reviewed, and no changes needed.   Review of Systems: No fevers, chills, night sweats, weight loss, chest pain, or shortness of breath.   Objective:    General: Well Developed, well nourished, and in no acute distress.  Neuro: Alert and oriented x3, extra-ocular muscles intact, sensation grossly intact.  HEENT: Normocephalic, atraumatic, pupils equal round reactive to light, neck supple, no masses, no lymphadenopathy, thyroid nonpalpable.  Skin: Warm and dry, no rashes. Cardiac: Regular rate and rhythm, no murmurs rubs or gallops, no lower extremity edema.  Respiratory: Clear to auscultation bilaterally. Not using accessory muscles, speaking in full sentences.  Impression and Recommendations:

## 2015-06-12 ENCOUNTER — Ambulatory Visit (INDEPENDENT_AMBULATORY_CARE_PROVIDER_SITE_OTHER): Payer: BLUE CROSS/BLUE SHIELD | Admitting: Sports Medicine

## 2015-06-12 ENCOUNTER — Encounter: Payer: Self-pay | Admitting: Sports Medicine

## 2015-06-12 VITALS — BP 134/73 | HR 120 | Resp 18 | Wt 330.2 lb

## 2015-06-12 DIAGNOSIS — I10 Essential (primary) hypertension: Secondary | ICD-10-CM | POA: Diagnosis not present

## 2015-06-12 DIAGNOSIS — M2240 Chondromalacia patellae, unspecified knee: Secondary | ICD-10-CM | POA: Diagnosis not present

## 2015-06-12 DIAGNOSIS — R000 Tachycardia, unspecified: Secondary | ICD-10-CM

## 2015-06-12 MED ORDER — MONTELUKAST SODIUM 10 MG PO TABS: 10.0000 mg | ORAL_TABLET | Freq: Every day | ORAL | Status: DC

## 2015-06-12 NOTE — Assessment & Plan Note (Signed)
Three-month response to previous injection of the left knee, bilateral injection today. Return in one.

## 2015-06-12 NOTE — Progress Notes (Signed)
  Subjective:    CC: follow-up  HPI: Hypertension: Well controlled  Tachycardia: Persistent, normal TSH, hemoglobin is essentially normal, no shortness of breath to suggest a PE. ECG has shown sinus tachycardia, we wanted to do a beta blocker over she is trying to become pregnant.  Bilateral knee pain: Good response, 3 months to left knee injection, now needs an injection on on both sides, pain is moderate, persistent, localized under the kneecaps  Past medical history, Surgical history, Family history not pertinant except as noted below, Social history, Allergies, and medications have been entered into the medical record, reviewed, and no changes needed.   Review of Systems: No fevers, chills, night sweats, weight loss, chest pain, or shortness of breath.   Objective:    General: Well Developed, well nourished, and in no acute distress.  Neuro: Alert and oriented x3, extra-ocular muscles intact, sensation grossly intact.  HEENT: Normocephalic, atraumatic, pupils equal round reactive to light, neck supple, no masses, no lymphadenopathy, thyroid nonpalpable.  Skin: Warm and dry, no rashes. Cardiac: Regular rate and rhythm, no murmurs rubs or gallops, no lower extremity edema.  Respiratory: Clear to auscultation bilaterally. Not using accessory muscles, speaking in full sentences.  Procedure: Real-time Ultrasound Guided Injection of left knee Device: GE Logiq E  Verbal informed consent obtained.  Time-out conducted.  Noted no overlying erythema, induration, or other signs of local infection.  Skin prepped in a sterile fashion.  Local anesthesia: Topical Ethyl chloride.  With sterile technique and under real time ultrasound guidance:  1 mL kenalog 40, 2 mL lidocaine, 2 mL Marcaine injected easily Completed without difficulty  Pain immediately resolved suggesting accurate placement of the medication.  Advised to call if fevers/chills, erythema, induration, drainage, or persistent  bleeding.  Images permanently stored and available for review in the ultrasound unit.  Impression: Technically successful ultrasound guided injection.  Procedure: Real-time Ultrasound Guided Injection of right knee Device: GE Logiq E  Verbal informed consent obtained.  Time-out conducted.  Noted no overlying erythema, induration, or other signs of local infection.  Skin prepped in a sterile fashion.  Local anesthesia: Topical Ethyl chloride.  With sterile technique and under real time ultrasound guidance:  1 mL kenalog 40, 2 mL lidocaine, 2 mL Marcaine injected easily Completed without difficulty  Pain immediately resolved suggesting accurate placement of the medication.  Advised to call if fevers/chills, erythema, induration, drainage, or persistent bleeding.  Images permanently stored and available for review in the ultrasound unit.  Impression: Technically successful ultrasound guided injection.  Impression and Recommendations:

## 2015-06-12 NOTE — Assessment & Plan Note (Signed)
Well-controlled, a bit tachycardic, we will let a beta blocker after her pregnancy.

## 2015-06-28 ENCOUNTER — Encounter: Payer: Self-pay | Admitting: Sports Medicine

## 2015-07-03 ENCOUNTER — Encounter: Payer: Self-pay | Admitting: Sports Medicine

## 2015-07-04 ENCOUNTER — Encounter: Payer: BLUE CROSS/BLUE SHIELD | Admitting: Sports Medicine

## 2015-07-10 DIAGNOSIS — Z113 Encounter for screening for infections with a predominantly sexual mode of transmission: Secondary | ICD-10-CM | POA: Diagnosis not present

## 2015-07-13 ENCOUNTER — Encounter: Payer: Self-pay | Admitting: Sports Medicine

## 2015-07-13 ENCOUNTER — Ambulatory Visit (INDEPENDENT_AMBULATORY_CARE_PROVIDER_SITE_OTHER): Payer: BLUE CROSS/BLUE SHIELD | Admitting: Sports Medicine

## 2015-07-13 DIAGNOSIS — I10 Essential (primary) hypertension: Secondary | ICD-10-CM | POA: Diagnosis not present

## 2015-07-13 MED ORDER — LABETALOL HCL 200 MG PO TABS: 200.0000 mg | ORAL_TABLET | Freq: Two times a day (BID) | ORAL | Status: DC

## 2015-07-13 NOTE — Progress Notes (Signed)
  Subjective:    CC: Hypertension  HPI: This is a pleasant 30 year old female, she is currently undergoing fertility process, she does have an extraction coming up. She has hypertension as well as tachycardia with a mild headache. Currently only on amlodipine and hydrochlorothiazide. We have been resistant to starting a beta blocker historically. No chest pain, visual changes.  Past medical history, Surgical history, Family history not pertinant except as noted below, Social history, Allergies, and medications have been entered into the medical record, reviewed, and no changes needed.   Review of Systems: No fevers, chills, night sweats, weight loss, chest pain, or shortness of breath.   Objective:    General: Well Developed, well nourished, and in no acute distress.  Neuro: Alert and oriented x3, extra-ocular muscles intact, sensation grossly intact.  HEENT: Normocephalic, atraumatic, pupils equal round reactive to light, neck supple, no masses, no lymphadenopathy, thyroid nonpalpable.  Skin: Warm and dry, no rashes. Cardiac: Regular rate and rhythm, no murmurs rubs or gallops, no lower extremity edema.  Respiratory: Clear to auscultation bilaterally. Not using accessory muscles, speaking in full sentences.  Impression and Recommendations:   I spent 25 minutes with this patient, greater than 50% was face-to-face time counseling regarding the above diagnoses, so this time included consultation with OB/GYN for advice on treatment.

## 2015-07-13 NOTE — Assessment & Plan Note (Signed)
Continue amlodipine and hydrochlorothiazide, Percocet discussion with OB/GYN labetalol will be safe, her egg extraction is coming up. Adding labetalol 200mg  twice a day, return in 2 weeks to recheck vitals. Nurse visit is okay.

## 2015-07-17 ENCOUNTER — Encounter: Payer: Self-pay | Admitting: Sports Medicine

## 2015-07-22 DIAGNOSIS — E288 Other ovarian dysfunction: Secondary | ICD-10-CM | POA: Diagnosis not present

## 2015-07-28 ENCOUNTER — Ambulatory Visit: Payer: BLUE CROSS/BLUE SHIELD | Admitting: Sports Medicine

## 2015-08-07 DIAGNOSIS — Z3200 Encounter for pregnancy test, result unknown: Secondary | ICD-10-CM | POA: Diagnosis not present

## 2015-08-23 ENCOUNTER — Encounter: Payer: Self-pay | Admitting: Sports Medicine

## 2015-08-24 ENCOUNTER — Encounter: Payer: Self-pay | Admitting: Sports Medicine

## 2015-08-24 ENCOUNTER — Ambulatory Visit (INDEPENDENT_AMBULATORY_CARE_PROVIDER_SITE_OTHER): Payer: BLUE CROSS/BLUE SHIELD | Admitting: Sports Medicine

## 2015-08-24 VITALS — BP 137/86 | HR 106 | Wt 340.0 lb

## 2015-08-24 DIAGNOSIS — M545 Low back pain, unspecified: Secondary | ICD-10-CM

## 2015-08-24 DIAGNOSIS — M5136 Other intervertebral disc degeneration, lumbar region: Secondary | ICD-10-CM | POA: Insufficient documentation

## 2015-08-24 DIAGNOSIS — M51369 Other intervertebral disc degeneration, lumbar region without mention of lumbar back pain or lower extremity pain: Secondary | ICD-10-CM | POA: Insufficient documentation

## 2015-08-24 MED ORDER — METFORMIN HCL 1000 MG PO TABS: ORAL_TABLET | ORAL | Status: DC

## 2015-08-24 MED ORDER — LIRAGLUTIDE -WEIGHT MANAGEMENT 18 MG/3ML ~~LOC~~ SOPN: 3.0000 mg | PEN_INJECTOR | Freq: Every day | SUBCUTANEOUS | Status: DC

## 2015-08-24 MED ORDER — PHENTERMINE HCL 37.5 MG PO TABS: ORAL_TABLET | ORAL | Status: DC

## 2015-08-24 MED ORDER — PREDNISONE 50 MG PO TABS
ORAL_TABLET | ORAL | Status: DC
Start: 2015-08-24 — End: 2015-09-21

## 2015-08-24 MED ORDER — TOPIRAMATE 50 MG PO TABS: ORAL_TABLET | ORAL | Status: DC

## 2015-08-24 NOTE — Assessment & Plan Note (Signed)
Axial and discogenic, home rehabilitation exercises, prednisone, x-rays.  Return in one month for this.

## 2015-08-24 NOTE — Assessment & Plan Note (Signed)
Restarting weight loss medication, increasing metformin 2500 mg daily. Starting phentermine, Topamax, she has new insurance now so we are going to try Saxenda again.  Ultimately with concurrent depression I would like to eventually add Wellbutrin and naltrexone at some point.

## 2015-08-24 NOTE — Progress Notes (Signed)
  Subjective:    CC: Back pain  HPI: For a couple of weeks this pleasant 30 year old female has had axial back pain worse with sitting, flexion, Valsalva without a radicular component. She has a history of lumbar degenerative disc disease in his seen a chiropractor in the past. Pain is moderate, persistent.  No bowel or bladder distention, saddle numbness, constitutional symptoms.  Obesity: Desires to restart weight loss medication.  Embryo transfer and IVF did not work after the first cycle.   Past medical history, Surgical history, Family history not pertinant except as noted below, Social history, Allergies, and medications have been entered into the medical record, reviewed, and no changes needed.   Review of Systems: No fevers, chills, night sweats, weight loss, chest pain, or shortness of breath.   Objective:    General: Well Developed, well nourished, and in no acute distress.  Neuro: Alert and oriented x3, extra-ocular muscles intact, sensation grossly intact.  HEENT: Normocephalic, atraumatic, pupils equal round reactive to light, neck supple, no masses, no lymphadenopathy, thyroid nonpalpable.  Skin: Warm and dry, no rashes. Cardiac: Regular rate and rhythm, no murmurs rubs or gallops, no lower extremity edema.  Respiratory: Clear to auscultation bilaterally. Not using accessory muscles, speaking in full sentences. Back Exam:  Inspection: Unremarkable  Motion: Flexion 45 deg, Extension 45 deg, Side Bending to 45 deg bilaterally,  Rotation to 45 deg bilaterally  SLR laying: Negative  XSLR laying: Negative  Palpable tenderness: None. FABER: negative. Sensory change: Gross sensation intact to all lumbar and sacral dermatomes.  Reflexes: 2+ at both patellar tendons, 2+ at achilles tendons, Babinski's downgoing.  Strength at foot  Plantar-flexion: 5/5 Dorsi-flexion: 5/5 Eversion: 5/5 Inversion: 5/5  Leg strength  Quad: 5/5 Hamstring: 5/5 Hip flexor: 5/5 Hip abductors: 5/5    Gait unremarkable.  Impression and Recommendations:   I spent 25 minutes with this patient, greater than 50% was face-to-face time counseling regarding the above diagnoses

## 2015-09-20 ENCOUNTER — Other Ambulatory Visit: Payer: Self-pay | Admitting: Sports Medicine

## 2015-09-21 ENCOUNTER — Ambulatory Visit (INDEPENDENT_AMBULATORY_CARE_PROVIDER_SITE_OTHER): Payer: BLUE CROSS/BLUE SHIELD | Admitting: Sports Medicine

## 2015-09-21 ENCOUNTER — Encounter: Payer: Self-pay | Admitting: Sports Medicine

## 2015-09-21 DIAGNOSIS — M7741 Metatarsalgia, right foot: Secondary | ICD-10-CM | POA: Diagnosis not present

## 2015-09-21 DIAGNOSIS — M7742 Metatarsalgia, left foot: Secondary | ICD-10-CM | POA: Diagnosis not present

## 2015-09-21 DIAGNOSIS — M545 Low back pain, unspecified: Secondary | ICD-10-CM

## 2015-09-21 MED ORDER — TOPIRAMATE 50 MG PO TABS: 50.0000 mg | ORAL_TABLET | Freq: Every day | ORAL | 0 refills | Status: DC

## 2015-09-21 MED ORDER — NALTREXONE HCL 50 MG PO TABS: ORAL_TABLET | ORAL | 3 refills | Status: DC

## 2015-09-21 MED ORDER — BUPROPION HCL ER (XL) 150 MG PO TB24: ORAL_TABLET | ORAL | 3 refills | Status: DC

## 2015-09-21 MED ORDER — PHENTERMINE HCL 37.5 MG PO TABS: ORAL_TABLET | ORAL | 0 refills | Status: DC

## 2015-09-21 NOTE — Progress Notes (Signed)
  Subjective:    CC: Weight check  HPI: Obesity: 10 pound weight loss after the first month on phentermine, Topamax, never got Saxenda, but does desire to start Wellbutrin/naltrexone.  Left foot pain: Localized at the plantar metatarsal heads, has orthotics but no metatarsal pads.  Low back pain: Axial, right-sided, has failed physician directed rehabilitation for 6 weeks.  Past medical history, Surgical history, Family history not pertinant except as noted below, Social history, Allergies, and medications have been entered into the medical record, reviewed, and no changes needed.   Review of Systems: No fevers, chills, night sweats, weight loss, chest pain, or shortness of breath.   Objective:    General: Well Developed, well nourished, and in no acute distress.  Neuro: Alert and oriented x3, extra-ocular muscles intact, sensation grossly intact.  HEENT: Normocephalic, atraumatic, pupils equal round reactive to light, neck supple, no masses, no lymphadenopathy, thyroid nonpalpable.  Skin: Warm and dry, no rashes. Cardiac: Regular rate and rhythm, no murmurs rubs or gallops, no lower extremity edema.  Respiratory: Clear to auscultation bilaterally. Not using accessory muscles, speaking in full sentences. Left Foot inspection and palpation reveals breakdown of the transverse arch and a drop of MT heads. Abnormal callus is present between the second, third, and fourth metatarsal heads. Hammer toes are present. TTP under the metatarsal heads.  Impression and Recommendations:    OBESITY, CLASS III 10 pound weight loss, refilling phentermine, Topamax, switching from [place] which she did not obtain to Wellbutrin/naltrexone.  Low back pain Needs to get x-ray, ordering MRI, failed physician directed rehabilitation for 6 weeks.  Metatarsalgia of both feet Left-sided metatarsal pad placed.

## 2015-09-21 NOTE — Assessment & Plan Note (Signed)
10 pound weight loss, refilling phentermine, Topamax, switching from [place] which she did not obtain to Wellbutrin/naltrexone.

## 2015-09-21 NOTE — Assessment & Plan Note (Signed)
Needs to get x-ray, ordering MRI, failed physician directed rehabilitation for 6 weeks.

## 2015-09-21 NOTE — Assessment & Plan Note (Signed)
Left-sided metatarsal pad placed.

## 2015-09-25 ENCOUNTER — Encounter: Payer: Self-pay | Admitting: Sports Medicine

## 2015-09-25 DIAGNOSIS — I10 Essential (primary) hypertension: Secondary | ICD-10-CM

## 2015-09-25 MED ORDER — LABETALOL HCL 200 MG PO TABS: 200.0000 mg | ORAL_TABLET | Freq: Two times a day (BID) | ORAL | 3 refills | Status: DC

## 2015-10-19 ENCOUNTER — Encounter: Payer: Self-pay | Admitting: Sports Medicine

## 2015-10-19 ENCOUNTER — Ambulatory Visit (INDEPENDENT_AMBULATORY_CARE_PROVIDER_SITE_OTHER): Payer: BLUE CROSS/BLUE SHIELD | Admitting: Sports Medicine

## 2015-10-19 DIAGNOSIS — M2240 Chondromalacia patellae, unspecified knee: Secondary | ICD-10-CM

## 2015-10-19 MED ORDER — PHENTERMINE HCL 37.5 MG PO TABS: ORAL_TABLET | ORAL | 0 refills | Status: DC

## 2015-10-19 MED ORDER — BUPROPION HCL ER (XL) 150 MG PO TB24: ORAL_TABLET | ORAL | 3 refills | Status: DC

## 2015-10-19 MED ORDER — NALTREXONE HCL 50 MG PO TABS: ORAL_TABLET | ORAL | 3 refills | Status: DC

## 2015-10-19 MED ORDER — TOPIRAMATE 50 MG PO TABS: 50.0000 mg | ORAL_TABLET | Freq: Every day | ORAL | 0 refills | Status: DC

## 2015-10-19 NOTE — Assessment & Plan Note (Signed)
Additional 10 pound weight loss after the second month.  Refilling phentermine, Topamax, Wellbutrin, Naltrexone.

## 2015-10-19 NOTE — Progress Notes (Addendum)
  Subjective:    CC: Follow-up  HPI: Primary osteoarthritis of both knees: Recent injection was 4 months ago, desires repeat injection today.  Obesity: 10 additional pound weight loss, did not yet start her naltrexone and Wellbutrin.  Past medical history, Surgical history, Family history not pertinant except as noted below, Social history, Allergies, and medications have been entered into the medical record, reviewed, and no changes needed.   Review of Systems: No fevers, chills, night sweats, weight loss, chest pain, or shortness of breath.   Objective:    General: Well Developed, well nourished, and in no acute distress.  Neuro: Alert and oriented x3, extra-ocular muscles intact, sensation grossly intact.  HEENT: Normocephalic, atraumatic, pupils equal round reactive to light, neck supple, no masses, no lymphadenopathy, thyroid nonpalpable.  Skin: Warm and dry, no rashes. Cardiac: Regular rate and rhythm, no murmurs rubs or gallops, no lower extremity edema.  Respiratory: Clear to auscultation bilaterally. Not using accessory muscles, speaking in full sentences.  Procedure: Real-time Ultrasound Guided Injection of left knee Device: GE Logiq E  Verbal informed consent obtained.  Time-out conducted.  Noted no overlying erythema, induration, or other signs of local infection.  Skin prepped in a sterile fashion.  Local anesthesia: Topical Ethyl chloride.  With sterile technique and under real time ultrasound guidance:  1 mL kenalog 40, 2 mL lidocaine, 2 mL Marcaine injected easily. Completed without difficulty  Pain immediately resolved suggesting accurate placement of the medication.  Advised to call if fevers/chills, erythema, induration, drainage, or persistent bleeding.  Images permanently stored and available for review in the ultrasound unit.  Impression: Technically successful ultrasound guided injection. Impression and Recommendations:    Chondromalacia of patellofemoral  joint Left knee injection as above.  OBESITY, CLASS III Additional 10 pound weight loss after the second month.  Refilling phentermine, Topamax, Wellbutrin, Naltrexone.  I spent 25 minutes with this patient, greater than 50% was face-to-face time counseling regarding the above diagnoses

## 2015-10-19 NOTE — Assessment & Plan Note (Signed)
Left knee injection as above. 

## 2015-10-27 ENCOUNTER — Telehealth: Payer: BLUE CROSS/BLUE SHIELD | Admitting: Family

## 2015-10-27 ENCOUNTER — Encounter: Payer: Self-pay | Admitting: Sports Medicine

## 2015-10-27 DIAGNOSIS — N3001 Acute cystitis with hematuria: Secondary | ICD-10-CM

## 2015-10-27 MED ORDER — NITROFURANTOIN MONOHYD MACRO 100 MG PO CAPS
100.0000 mg | ORAL_CAPSULE | Freq: Two times a day (BID) | ORAL | 0 refills | Status: DC
Start: 2015-10-27 — End: 2015-11-16

## 2015-10-27 NOTE — Progress Notes (Signed)

## 2015-11-16 ENCOUNTER — Ambulatory Visit: Payer: BLUE CROSS/BLUE SHIELD | Admitting: Sports Medicine

## 2015-11-16 ENCOUNTER — Telehealth: Payer: BLUE CROSS/BLUE SHIELD | Admitting: Physician Assistant

## 2015-11-16 DIAGNOSIS — N3001 Acute cystitis with hematuria: Secondary | ICD-10-CM

## 2015-11-16 MED ORDER — NITROFURANTOIN MONOHYD MACRO 100 MG PO CAPS: 100.0000 mg | ORAL_CAPSULE | Freq: Two times a day (BID) | ORAL | 0 refills | Status: DC

## 2015-11-16 NOTE — Progress Notes (Signed)

## 2015-11-23 ENCOUNTER — Ambulatory Visit (INDEPENDENT_AMBULATORY_CARE_PROVIDER_SITE_OTHER): Payer: BLUE CROSS/BLUE SHIELD | Admitting: Sports Medicine

## 2015-11-23 DIAGNOSIS — I10 Essential (primary) hypertension: Secondary | ICD-10-CM

## 2015-11-23 DIAGNOSIS — M2240 Chondromalacia patellae, unspecified knee: Secondary | ICD-10-CM

## 2015-11-23 MED ORDER — TOPIRAMATE 50 MG PO TABS: 50.0000 mg | ORAL_TABLET | Freq: Every day | ORAL | 0 refills | Status: DC

## 2015-11-23 MED ORDER — PHENTERMINE HCL 37.5 MG PO TABS: ORAL_TABLET | ORAL | 0 refills | Status: DC

## 2015-11-23 MED ORDER — LABETALOL HCL 200 MG PO TABS: 200.0000 mg | ORAL_TABLET | Freq: Two times a day (BID) | ORAL | 3 refills | Status: DC

## 2015-11-23 MED ORDER — BUPROPION HCL ER (XL) 150 MG PO TB24: ORAL_TABLET | ORAL | 3 refills | Status: DC

## 2015-11-23 NOTE — Assessment & Plan Note (Signed)
5 or 6 more pounds weight loss after the third month. Unable to tolerate naltrexone, continue phentermine, Topamax, Wellbutrin.

## 2015-11-23 NOTE — Assessment & Plan Note (Signed)
Left knee injection last month. Right knee injection has been a solid 5 months, repeat right knee injection as above. Return to see me as needed.

## 2015-11-23 NOTE — Progress Notes (Signed)
  Subjective:    CC: Follow-up  HPI: Knee osteoarthritis: Desires injection today into the right knee. Pain is moderate, persistent, localized without radiation.  Obesity: Has lost some weight. Needs a refill on medications, naltrexone did make her somewhat nauseated.  Past medical history:  Negative.  See flowsheet/record as well for more information.  Surgical history: Negative.  See flowsheet/record as well for more information.  Family history: Negative.  See flowsheet/record as well for more information.  Social history: Negative.  See flowsheet/record as well for more information.  Allergies, and medications have been entered into the medical record, reviewed, and no changes needed.   Review of Systems: No fevers, chills, night sweats, weight loss, chest pain, or shortness of breath.   Objective:    General: Well Developed, well nourished, and in no acute distress.  Neuro: Alert and oriented x3, extra-ocular muscles intact, sensation grossly intact.  HEENT: Normocephalic, atraumatic, pupils equal round reactive to light, neck supple, no masses, no lymphadenopathy, thyroid nonpalpable.  Skin: Warm and dry, no rashes. Cardiac: Regular rate and rhythm, no murmurs rubs or gallops, no lower extremity edema.  Respiratory: Clear to auscultation bilaterally. Not using accessory muscles, speaking in full sentences.  Procedure: Real-time Ultrasound Guided Injection of right knee Device: GE Logiq E  Verbal informed consent obtained.  Time-out conducted.  Noted no overlying erythema, induration, or other signs of local infection.  Skin prepped in a sterile fashion.  Local anesthesia: Topical Ethyl chloride.  With sterile technique and under real time ultrasound guidance:  1 mL kenalog 40, 2 mL lidocaine, 2 mL Marcaine injected easily. Completed without difficulty  Pain immediately resolved suggesting accurate placement of the medication.  Advised to call if fevers/chills, erythema,  induration, drainage, or persistent bleeding.  Images permanently stored and available for review in the ultrasound unit.  Impression: Technically successful ultrasound guided injection.  Impression and Recommendations:    Chondromalacia of patellofemoral joint Left knee injection last month. Right knee injection has been a solid 5 months, repeat right knee injection as above. Return to see me as needed.  OBESITY, CLASS III 5 or 6 more pounds weight loss after the third month. Unable to tolerate naltrexone, continue phentermine, Topamax, Wellbutrin.

## 2015-12-21 ENCOUNTER — Encounter: Payer: Self-pay | Admitting: Sports Medicine

## 2015-12-21 ENCOUNTER — Ambulatory Visit (INDEPENDENT_AMBULATORY_CARE_PROVIDER_SITE_OTHER): Payer: BLUE CROSS/BLUE SHIELD | Admitting: Sports Medicine

## 2015-12-21 DIAGNOSIS — M791 Myalgia: Secondary | ICD-10-CM | POA: Diagnosis not present

## 2015-12-21 DIAGNOSIS — M7918 Myalgia, other site: Secondary | ICD-10-CM

## 2015-12-21 DIAGNOSIS — I10 Essential (primary) hypertension: Secondary | ICD-10-CM | POA: Diagnosis not present

## 2015-12-21 MED ORDER — BUPROPION HCL ER (XL) 150 MG PO TB24: ORAL_TABLET | ORAL | 3 refills | Status: DC

## 2015-12-21 MED ORDER — MONTELUKAST SODIUM 10 MG PO TABS: 10.0000 mg | ORAL_TABLET | Freq: Every day | ORAL | 4 refills | Status: DC

## 2015-12-21 MED ORDER — LABETALOL HCL 200 MG PO TABS: 200.0000 mg | ORAL_TABLET | Freq: Two times a day (BID) | ORAL | 3 refills | Status: DC

## 2015-12-21 MED ORDER — DULOXETINE HCL 60 MG PO CPEP: 120.0000 mg | ORAL_CAPSULE | Freq: Every day | ORAL | 3 refills | Status: DC

## 2015-12-21 MED ORDER — HYDROCHLOROTHIAZIDE 50 MG PO TABS: 50.0000 mg | ORAL_TABLET | Freq: Every day | ORAL | 3 refills | Status: DC

## 2015-12-21 MED ORDER — CETIRIZINE HCL 10 MG PO TABS: 10.0000 mg | ORAL_TABLET | Freq: Every morning | ORAL | 3 refills | Status: DC

## 2015-12-21 MED ORDER — TOPIRAMATE 50 MG PO TABS: 50.0000 mg | ORAL_TABLET | Freq: Every day | ORAL | 3 refills | Status: DC

## 2015-12-21 MED ORDER — PHENTERMINE HCL 37.5 MG PO TABS: ORAL_TABLET | ORAL | 0 refills | Status: DC

## 2015-12-21 MED ORDER — METFORMIN HCL 1000 MG PO TABS: ORAL_TABLET | ORAL | 3 refills | Status: DC

## 2015-12-21 MED ORDER — AMLODIPINE BESYLATE 10 MG PO TABS: 10.0000 mg | ORAL_TABLET | Freq: Every day | ORAL | 3 refills | Status: DC

## 2015-12-21 NOTE — Progress Notes (Signed)
  Subjective:    CC: Follow-up  HPI: Obesity: 5 additional pound weight loss.  Past medical history:  Negative.  See flowsheet/record as well for more information.  Surgical history: Negative.  See flowsheet/record as well for more information.  Family history: Negative.  See flowsheet/record as well for more information.  Social history: Negative.  See flowsheet/record as well for more information.  Allergies, and medications have been entered into the medical record, reviewed, and no changes needed.   Review of Systems: No fevers, chills, night sweats, weight loss, chest pain, or shortness of breath.   Objective:    General: Well Developed, well nourished, and in no acute distress.  Neuro: Alert and oriented x3, extra-ocular muscles intact, sensation grossly intact.  HEENT: Normocephalic, atraumatic, pupils equal round reactive to light, neck supple, no masses, no lymphadenopathy, thyroid nonpalpable.  Skin: Warm and dry, no rashes. Cardiac: Regular rate and rhythm, no murmurs rubs or gallops, no lower extremity edema.  Respiratory: Clear to auscultation bilaterally. Not using accessory muscles, speaking in full sentences.  Impression and Recommendations:    OBESITY, CLASS III 5 more pounds after the fourth month. Unable to tolerate naltrexone, continue phentermine, Topamax, Wellbutrin. We also discussed bariatric surgery, they will think about it and let me know.

## 2015-12-21 NOTE — Assessment & Plan Note (Signed)
5 more pounds after the fourth month. Unable to tolerate naltrexone, continue phentermine, Topamax, Wellbutrin. We also discussed bariatric surgery, they will think about it and let me know.

## 2016-01-02 ENCOUNTER — Ambulatory Visit (INDEPENDENT_AMBULATORY_CARE_PROVIDER_SITE_OTHER): Payer: BLUE CROSS/BLUE SHIELD | Admitting: Family Medicine

## 2016-01-02 VITALS — BP 132/87 | HR 116 | Temp 98.3°F | Wt 310.0 lb

## 2016-01-02 DIAGNOSIS — K121 Other forms of stomatitis: Secondary | ICD-10-CM

## 2016-01-02 MED ORDER — VALACYCLOVIR HCL 1 G PO TABS: 1000.0000 mg | ORAL_TABLET | Freq: Two times a day (BID) | ORAL | 0 refills | Status: DC

## 2016-01-02 MED ORDER — TRIAMCINOLONE ACETONIDE 0.1 % MT PSTE: 1.0000 "application " | PASTE | Freq: Two times a day (BID) | OROMUCOSAL | 11 refills | Status: DC

## 2016-01-02 MED ORDER — LIDOCAINE VISCOUS 2 % MT SOLN: 20.0000 mL | OROMUCOSAL | 3 refills | Status: DC | PRN

## 2016-01-02 NOTE — Patient Instructions (Signed)
Thank you for coming in today. Return with Dr T for check up in 1 week.   I am concerned for hand foot and mouth disease.    Cold Sore A cold sore (fever blister) is a skin infection caused by the herpes simplex virus (HSV-1). HSV-1 is closely related to the virus that causes genital herpes (HSV-2), but they are not the same even though both viruses can cause oral and genital infections. Cold sores are small, fluid-filled sores inside of the mouth or on the lips, gums, nose, chin, cheeks, or fingers.  The herpes simplex virus can be easily passed (contagious) to other people through close personal contact, such as kissing or sharing personal items. The virus can also spread to other parts of the body, such as the eyes or genitals. Cold sores are contagious until the sores crust over completely. They often heal within 2 weeks.  Once a person is infected, the herpes simplex virus remains permanently in the body. Therefore, there is no cure for cold sores, and they often recur when a person is tired, stressed, sick, or gets too much sun. Additional factors that can cause a recurrence include hormone changes in menstruation or pregnancy, certain drugs, and cold weather.  CAUSES  Cold sores are caused by the herpes simplex virus. The virus is spread from person to person through close contact, such as through kissing, touching the affected area, or sharing personal items such as lip balm, razors, or eating utensils.  SYMPTOMS  The first infection may not cause symptoms. If symptoms develop, the symptoms often go through different stages. Here is how a cold sore develops:   Tingling, itching, or burning is felt 1-2 days before the outbreak.   Fluid-filled blisters appear on the lips, inside the mouth, nose, or on the cheeks.   The blisters start to ooze clear fluid.   The blisters dry up and a yellow crust appears in its place.   The crust falls off.  Symptoms depend on whether it is the  initial outbreak or a recurrence. Some other symptoms with the first outbreak may include:   Fever.   Sore throat.   Headache.   Muscle aches.   Swollen neck glands.  DIAGNOSIS  A diagnosis is often made based on your symptoms and looking at the sores. Sometimes, a sore may be swabbed and then examined in the lab to make a final diagnosis. If the sores are not present, blood tests can find the herpes simplex virus.  TREATMENT  There is no cure for cold sores and no vaccine for the herpes simplex virus. Within 2 weeks, most cold sores go away on their own without treatment. Medicines cannot make the infection go away, but medicine can help relieve some of the pain associated with the sores, can work to stop the virus from multiplying, and can also shorten healing time. Medicine may be in the form of creams, gels, pills, or a shot.  HOME CARE INSTRUCTIONS   Only take over-the-counter or prescription medicines for pain, discomfort, or fever as directed by your caregiver. Do not use aspirin.   Use a cotton-tip swab to apply creams or gels to your sores.   Do not touch the sores or pick the scabs. Wash your hands often. Do not touch your eyes without washing your hands first.   Avoid kissing, oral sex, and sharing personal items until sores heal.   Apply an ice pack on your sores for 10-15 minutes to ease any  discomfort.   Avoid hot, cold, or salty foods because they may hurt your mouth. Eat a soft, bland diet to avoid irritating the sores. Use a straw to drink if you have pain when drinking out of a glass.   Keep sores clean and dry to prevent an infection of other tissues.   Avoid the sun and limit stress if these things trigger outbreaks. If sun causes cold sores, apply sunscreen on the lips before being out in the sun.  SEEK MEDICAL CARE IF:   You have a fever or persistent symptoms for more than 2-3 days.   You have a fever and your symptoms suddenly get worse.    You have pus, not clear fluid, coming from the sores.   You have redness that is spreading.   You have pain or irritation in your eye.   You get sores on your genitals.   Your sores do not heal within 2 weeks.   You have a weakened immune system.   You have frequent recurrences of cold sores.  MAKE SURE YOU:   Understand these instructions.  Will watch your condition.  Will get help right away if you are not doing well or get worse.   This information is not intended to replace advice given to you by your health care provider. Make sure you discuss any questions you have with your health care provider.   Document Released: 02/09/2000 Document Revised: 03/04/2014 Document Reviewed: 06/26/2011 [employer] Interactive Patient Education [employer]

## 2016-01-02 NOTE — Progress Notes (Signed)
Chelsea [name] is a 30 y.o. female who presents to [name]: Primary Care Sports Medicine today for oral ulcers. Patient is painful oral ulcers and mildly painful hand lesions. Symptoms present for about 4 days. She denies any fevers or chills nausea vomiting or diarrhea. She notes significant pain. She feels well otherwise. She has not had similar symptoms. She denies any sick contacts at home or work.   Past Medical History:  Diagnosis Date  . Depression   . Endometrial polyp   . History of recurrent UTIs   . Hypertension   . Infertility, anovulation   . Myofascial muscle pain   . OA (osteoarthritis) of knee    BILATERAL  . PCOS (polycystic ovarian syndrome)   . PONV (postoperative nausea and vomiting)   . Seasonal allergic rhinitis    Past Surgical History:  Procedure Laterality Date  . HYSTEROSCOPY W/D&C N/A 09/07/2014   Procedure:  DILATATION AND CURETTAGE /HYSTEROSCOPY/POLYPECTOMY;  Surgeon: Fermin Schwabamer Yalcinkaya, MD;  [facility];  Service: Gynecology;  Laterality: N/A;  . I & D RIGHT FOOT  1998  . POLYPECTOMY     uterine  . TONSILLECTOMY  2001  . WISDOM TOOTH EXTRACTION  2004   Social History  Substance Use Topics  . Smoking status: Never Smoker  . Smokeless tobacco: Never Used  . Alcohol use No   family history includes Cancer in her paternal grandfather; Diabetes in her father and mother; Fibromyalgia in her cousin and maternal aunt; Glaucoma in her paternal grandmother; Hypertension in her father; Polycystic ovary syndrome in her mother.  ROS as above:  Medications: Current Outpatient Prescriptions  Medication Sig Dispense Refill  . amLODipine (NORVASC) 10 MG tablet Take 1 tablet (10 mg total) by mouth daily. 90 tablet 3  . buPROPion (WELLBUTRIN XL) 150 MG 24 hr tablet 1 tab twice a day 180 tablet 3  . cetirizine (ZYRTEC) 10 MG tablet Take 1 tablet  (10 mg total) by mouth every morning. 90 tablet 3  . DULoxetine (CYMBALTA) 60 MG capsule Take 2 capsules (120 mg total) by mouth daily. 180 capsule 3  . hydrochlorothiazide (HYDRODIURIL) 50 MG tablet Take 1 tablet (50 mg total) by mouth daily. 90 tablet 3  . labetalol (NORMODYNE) 200 MG tablet Take 1 tablet (200 mg total) by mouth 2 (two) times daily. 180 tablet 3  . lidocaine (XYLOCAINE) 2 % solution Use as directed 20 mLs in the mouth or throat every 4 (four) hours as needed for mouth pain. 100 mL 3  . metFORMIN (GLUCOPHAGE) 1000 MG tablet One pill in the morning and 1-1/2 pills in the evening 75 tablet 3  . montelukast (SINGULAIR) 10 MG tablet Take 1 tablet (10 mg total) by mouth at bedtime. 90 tablet 4  . norgestimate-ethinyl estradiol (ORTHO-CYCLEN,SPRINTEC,PREVIFEM) 0.25-35 MG-MCG tablet Take 1 tablet by mouth daily. Reported on 04/11/2015    . phentermine (ADIPEX-P) 37.5 MG tablet One tab by mouth qAM 30 tablet 0  . Prenatal Multivit-Min-Fe-FA (PRENATAL VITAMINS PO) Take by mouth.    . Probiotic Product (PROBIOTIC DAILY PO) Take by mouth.    . rizatriptan (MAXALT) 10 MG tablet Take 1 tablet (10 mg total) by mouth as needed for migraine. May repeat in 2 hours if needed 90 tablet 0  . topiramate (TOPAMAX) 50 MG tablet Take 1 tablet (50 mg total) by mouth daily. 90 tablet 3  . triamcinolone (KENALOG) 0.1 % paste Use as directed 1 application in the  mouth or throat 2 (two) times daily. 5 g 11  . valACYclovir (VALTREX) 1000 MG tablet Take 1 tablet (1,000 mg total) by mouth 2 (two) times daily. 20 tablet 0   No current facility-administered medications for this visit.    Allergies  Allergen Reactions  . Amoxicillin Other (See Comments)    Caused yeast infection  . Ceclor [Cefaclor] Other (See Comments)    Caused yeast infection  . Enalapril Maleate Swelling    REACTION: lip swelling  . Erythromycin Diarrhea  . Penicillins Other (See Comments)    Caused yeast infection  . Sulfa  Antibiotics     Health Maintenance Health Maintenance  Topic Date Due  . HIV Screening  11/23/2002  . TETANUS/TDAP  11/23/2006  . INFLUENZA VACCINE  09/26/2015  . PAP SMEAR  04/10/2018     Exam:  BP 132/87   Pulse (!) 116   Temp 98.3 F (36.8 C) (Oral)   Wt (!) 310 lb (140.6 kg)   BMI 45.78 kg/m  Gen: Well NAD HEENT: EOMI,  MMM : Erythematous with white colored area at the tip of the tongue with several small punched out or vesicular lesions on her lips. Tender to touch. Lungs: Normal work of breathing. CTABL Heart: Mild tachycardia present no MRG Abd: NABS, Soft. Nondistended, Nontender Exts: Brisk capillary refill, warm and well perfused.  Palms of hands: Small erythematous macular lesions mildly tender palms of hand bilaterally   No results found for this or any previous visit (from the past 72 hour(s)). No results found.    Assessment and Plan: [age] y.o. female with  Oral lesions with associated hand findings. This is concerning for hand foot and mouth disease. This could be a coxsackievirus. The oral lesions are also concerning for oral herpes. This could be a primary outbreak. A swab for HSV is pending. Will treat empirically with Valtrex. Additionally we'll use triamcinolone oral paste, and viscous lidocaine for symptom control. Recheck in about a week or sooner if needed. Work Note provided.   Orders Placed This Encounter  Procedures  . Herpes simplex virus culture    Discussed warning signs or symptoms. Please see discharge instructions. Patient expresses understanding.

## 2016-01-10 ENCOUNTER — Encounter: Payer: Self-pay | Admitting: Sports Medicine

## 2016-01-10 ENCOUNTER — Ambulatory Visit (INDEPENDENT_AMBULATORY_CARE_PROVIDER_SITE_OTHER): Payer: BLUE CROSS/BLUE SHIELD | Admitting: Sports Medicine

## 2016-01-10 DIAGNOSIS — B084 Enteroviral vesicular stomatitis with exanthem: Secondary | ICD-10-CM

## 2016-01-10 NOTE — Progress Notes (Signed)
  Subjective:    CC: Follow-up  HPI: Hand-foot-and-mouth disease: Essentially resolved now.  Past medical history:  Negative.  See flowsheet/record as well for more information.  Surgical history: Negative.  See flowsheet/record as well for more information.  Family history: Negative.  See flowsheet/record as well for more information.  Social history: Negative.  See flowsheet/record as well for more information.  Allergies, and medications have been entered into the medical record, reviewed, and no changes needed.   Review of Systems: No fevers, chills, night sweats, weight loss, chest pain, or shortness of breath.   Objective:    General: Well Developed, well nourished, and in no acute distress.  Neuro: Alert and oriented x3, extra-ocular muscles intact, sensation grossly intact.  HEENT: Normocephalic, atraumatic, pupils equal round reactive to light, neck supple, no masses, no lymphadenopathy, thyroid nonpalpable. No visible oral lesions. Skin: Warm and dry, no rashes. Cardiac: Regular rate and rhythm, no murmurs rubs or gallops, no lower extremity edema.  Respiratory: Clear to auscultation bilaterally. Not using accessory muscles, speaking in full sentences.  Impression and Recommendations:    Hand, foot and mouth disease Resolving, return as needed.

## 2016-01-10 NOTE — Assessment & Plan Note (Signed)
Resolving, return as needed 

## 2016-01-11 LAB — HERPES SIMPLEX VIRUS CULTURE: ORGANISM ID, BACTERIA: NOT DETECTED

## 2016-01-16 ENCOUNTER — Encounter: Payer: Self-pay | Admitting: Sports Medicine

## 2016-01-16 MED ORDER — NITROFURANTOIN MONOHYD MACRO 100 MG PO CAPS: 100.0000 mg | ORAL_CAPSULE | Freq: Two times a day (BID) | ORAL | 0 refills | Status: DC

## 2016-02-21 ENCOUNTER — Encounter: Payer: Self-pay | Admitting: Sports Medicine

## 2016-04-02 ENCOUNTER — Telehealth: Payer: Self-pay | Admitting: Sports Medicine

## 2016-04-02 NOTE — Telephone Encounter (Signed)
Called patient about flu shot and Declined

## 2016-04-25 ENCOUNTER — Ambulatory Visit (INDEPENDENT_AMBULATORY_CARE_PROVIDER_SITE_OTHER): Payer: BLUE CROSS/BLUE SHIELD | Admitting: Sports Medicine

## 2016-04-25 ENCOUNTER — Encounter: Payer: Self-pay | Admitting: Sports Medicine

## 2016-04-25 DIAGNOSIS — M2242 Chondromalacia patellae, left knee: Secondary | ICD-10-CM

## 2016-04-25 DIAGNOSIS — M791 Myalgia: Secondary | ICD-10-CM | POA: Diagnosis not present

## 2016-04-25 DIAGNOSIS — M7552 Bursitis of left shoulder: Secondary | ICD-10-CM | POA: Diagnosis not present

## 2016-04-25 DIAGNOSIS — M7918 Myalgia, other site: Secondary | ICD-10-CM

## 2016-04-25 NOTE — Assessment & Plan Note (Signed)
Injection as above, rehabilitation exercises given, return in one month.

## 2016-04-25 NOTE — Assessment & Plan Note (Signed)
At this point patient understands we are stopping all medical weight loss, and she will proceed with referral to bariatrics for consideration of gastric sleeve. We have tried and filled phentermine, GLP-1's, Contrave, Belviq, Topamax.

## 2016-04-25 NOTE — Assessment & Plan Note (Signed)
Left knee injection was in August 2017, repeating today. Right knee injection was in September 2017, continues to do well.

## 2016-04-25 NOTE — Assessment & Plan Note (Signed)
Continue Cymbalta 120, patient needs to restart her Wellbutrin.

## 2016-04-25 NOTE — Progress Notes (Signed)
Subjective:    CC: Multiple issues  HPI: Left shoulder pain: Localized of the deltoid, worse with overhead activity, moderate, persistent without radiation. Desires interventional treatment today.  Left knee pain: Known patellofemoral chondromalacia, previous injection was around 6-8 months ago, now having a recurrence of pain, anterior, worse with sitting, squatting, going up and down stairs, desires repeat interventional treatment today.  Obesity: We have tried multiple medications, she's never had a sustained response in terms of weight loss. She never got a call back last time we placed a referral for bariatric surgery and desires that we do this again.  Major depression: Currently on 120 mg of Cymbalta, has discontinued Wellbutrin, still feels some depressive symptoms, no suicidal or homicidal ideation.  Past medical history:  Negative.  See flowsheet/record as well for more information.  Surgical history: Negative.  See flowsheet/record as well for more information.  Family history: Negative.  See flowsheet/record as well for more information.  Social history: Negative.  See flowsheet/record as well for more information.  Allergies, and medications have been entered into the medical record, reviewed, and no changes needed.   Review of Systems: No fevers, chills, night sweats, weight loss, chest pain, or shortness of breath.   Objective:    General: Well Developed, well nourished, and in no acute distress.  Neuro: Alert and oriented x3, extra-ocular muscles intact, sensation grossly intact.  HEENT: Normocephalic, atraumatic, pupils equal round reactive to light, neck supple, no masses, no lymphadenopathy, thyroid nonpalpable.  Skin: Warm and dry, no rashes. Cardiac: Regular rate and rhythm, no murmurs rubs or gallops, no lower extremity edema.  Respiratory: Clear to auscultation bilaterally. Not using accessory muscles, speaking in full sentences. Left Shoulder: Inspection  reveals no abnormalities, atrophy or asymmetry. Palpation is normal with no tenderness over AC joint or bicipital groove. ROM is full in all planes. Rotator cuff strength normal throughout. Positive Neer and Hawkin's tests, empty can. Speeds and Yergason's tests normal. No labral pathology noted with negative Obrien's, negative crank, negative clunk, and good stability. Normal scapular function observed. No painful arc and no drop arm sign. No apprehension sign  Procedure: Real-time Ultrasound Guided Injection of left subacromial bursa Device: GE Logiq E  Verbal informed consent obtained.  Time-out conducted.  Noted no overlying erythema, induration, or other signs of local infection.  Skin prepped in a sterile fashion.  Local anesthesia: Topical Ethyl chloride.  With sterile technique and under real time ultrasound guidance:  Taking care to avoid intratendinous injection into the supraspinatus I placed 1 mL kenalog 40, 1 mL lidocaine, 1 mL bupivacaine into the subacromial bursa. Completed without difficulty  Pain immediately resolved suggesting accurate placement of the medication.  Advised to call if fevers/chills, erythema, induration, drainage, or persistent bleeding.  Images permanently stored and available for review in the ultrasound unit.  Impression: Technically successful ultrasound guided injection.  Procedure: Real-time Ultrasound Guided Injection of left knee Device: GE Logiq E  Verbal informed consent obtained.  Time-out conducted.  Noted no overlying erythema, induration, or other signs of local infection.  Skin prepped in a sterile fashion.  Local anesthesia: Topical Ethyl chloride.  With sterile technique and under real time ultrasound guidance:  1 mL kenalog 40, 1 2 mL lidocaine, 2 mL bupivacaine injected into the suprapatellar recess. Completed without difficulty  Pain immediately resolved suggesting accurate placement of the medication.  Advised to call if  fevers/chills, erythema, induration, drainage, or persistent bleeding.  Images permanently stored and available for review in the  ultrasound unit.  Impression: Technically successful ultrasound guided injection.  Impression and Recommendations:    Chondromalacia of patellofemoral joint Left knee injection was in August 2017, repeating today. Right knee injection was in September 2017, continues to do well.  Subacromial bursitis of left shoulder joint Injection as above, rehabilitation exercises given, return in one month.  OBESITY, CLASS III At this point patient understands we are stopping all medical weight loss, and she will proceed with referral to bariatrics for consideration of gastric sleeve. We have tried and filled phentermine, GLP-1's, Contrave, Belviq, Topamax.   Myofascial pain syndrome with depression Continue Cymbalta 120, patient needs to restart her Wellbutrin.

## 2016-05-20 ENCOUNTER — Encounter: Payer: Self-pay | Admitting: Sports Medicine

## 2016-05-23 ENCOUNTER — Ambulatory Visit (INDEPENDENT_AMBULATORY_CARE_PROVIDER_SITE_OTHER): Payer: BLUE CROSS/BLUE SHIELD | Admitting: Sports Medicine

## 2016-05-23 ENCOUNTER — Encounter: Payer: Self-pay | Admitting: Sports Medicine

## 2016-05-23 DIAGNOSIS — M7552 Bursitis of left shoulder: Secondary | ICD-10-CM | POA: Diagnosis not present

## 2016-05-23 DIAGNOSIS — M2242 Chondromalacia patellae, left knee: Secondary | ICD-10-CM

## 2016-05-23 DIAGNOSIS — I10 Essential (primary) hypertension: Secondary | ICD-10-CM | POA: Diagnosis not present

## 2016-05-23 MED ORDER — LABETALOL HCL 200 MG PO TABS: 400.0000 mg | ORAL_TABLET | Freq: Two times a day (BID) | ORAL | 3 refills | Status: DC

## 2016-05-23 NOTE — Assessment & Plan Note (Signed)
Doing well after subacromial injection at the last visit.

## 2016-05-23 NOTE — Progress Notes (Signed)
  Subjective:    CC: follow-up for knees and left shoulder  HPI: Ms. Kathlene NovemberMcCormick is a 31 y.o. female with a history of obesity, osteoarthritis, and HTN who presents for follow-up of left shoulder and bilateral knee injections. She says that the pain is better than it was, but is still bothering her. She does have full range of motion in all extremities. She also complained of uncontrolled HTN and weight because she wants to become pregnant, but was advised by bariatric surgery to wait until after surgery.  Past medical history:  Osteoarthritis.  See flowsheet/record as well for more information.  Surgical history: Negative.  See flowsheet/record as well for more information.  Family history: Negative.  See flowsheet/record as well for more information.  Social history: Negative.  See flowsheet/record as well for more information.  Allergies, and medications have been entered into the medical record, reviewed, and no changes needed.   Review of Systems: No fevers, chills, night sweats, weight loss, chest pain, or shortness of breath.   Objective:    General: Well Developed, well nourished, and in no acute distress.  Neuro: Alert and oriented x3, extra-ocular muscles intact, sensation grossly intact.  HEENT: Normocephalic, atraumatic, pupils equal round reactive to light, neck supple, no masses, no lymphadenopathy, thyroid nonpalpable.  Skin: Warm and dry, no rashes. Cardiac: Regular rate and rhythm, no murmurs rubs or gallops, no lower extremity edema.  Respiratory: Clear to auscultation bilaterally. Not using accessory muscles, speaking in full sentences. Musculoskeletal: full ROM and strength against resistance in all extremities. Positive empty can sign in left shoulder, negative Hawkins, drop-down signs. No TTP  Impression and Recommendations:    Shoulders better, knees better. Continue tylenol and PT. Pt is trying to become pregnant, so we want to avoid NSAIDs.  For HTN, bump up  lobetalol to 400 mg BID, continue HCTZ and amlodipine as before. Pt is going to see bariatric surgery for weight loss.

## 2016-05-23 NOTE — Assessment & Plan Note (Signed)
Still trying to get pregnant. Increasing labetalol to 400 mg twice a day, follow-up every 2 weeks for nurse visits for blood pressure medication augmentation. Continue amlodipine and hydrochlorothiazide.

## 2016-05-23 NOTE — Assessment & Plan Note (Signed)
Doing well after injections, left knee injection was done earlier this month, right knee injection was in September 2017.

## 2016-06-06 ENCOUNTER — Ambulatory Visit: Payer: BLUE CROSS/BLUE SHIELD | Admitting: Sports Medicine

## 2016-08-05 ENCOUNTER — Ambulatory Visit (INDEPENDENT_AMBULATORY_CARE_PROVIDER_SITE_OTHER): Payer: BLUE CROSS/BLUE SHIELD | Admitting: Sports Medicine

## 2016-08-05 ENCOUNTER — Encounter: Payer: Self-pay | Admitting: Sports Medicine

## 2016-08-05 DIAGNOSIS — M2242 Chondromalacia patellae, left knee: Secondary | ICD-10-CM

## 2016-08-05 DIAGNOSIS — I10 Essential (primary) hypertension: Secondary | ICD-10-CM

## 2016-08-05 NOTE — Assessment & Plan Note (Signed)
Increased labetalol to 400 mg twice a day at the last visit, she was supposed to continue her amlodipine and hydrochlorothiazide. Has been noncompliant with medications. She will restart the medications and follow-up in 2 weeks to recheck blood pressure.

## 2016-08-05 NOTE — Assessment & Plan Note (Signed)
Left knee injection as above. Advised Chelsea [name] that there'll be no more interventional treatment until her vital signs are stabilized.

## 2016-08-05 NOTE — Progress Notes (Signed)
  Subjective:    CC: Follow-up  HPI: Left knee osteoarthritis: Related to obesity, desires injection, previous injection was 3 months ago.  Hypertension: Has been noncompliant with medications, no headaches, visual changes, chest pain.  Grieving: Uncle just died.  Past medical history:  Negative.  See flowsheet/record as well for more information.  Surgical history: Negative.  See flowsheet/record as well for more information.  Family history: Negative.  See flowsheet/record as well for more information.  Social history: Negative.  See flowsheet/record as well for more information.  Allergies, and medications have been entered into the medical record, reviewed, and no changes needed.   Review of Systems: No fevers, chills, night sweats, weight loss, chest pain, or shortness of breath.   Objective:    General: Well Developed, well nourished, and in no acute distress.  Neuro: Alert and oriented x3, extra-ocular muscles intact, sensation grossly intact.  HEENT: Normocephalic, atraumatic, pupils equal round reactive to light, neck supple, no masses, no lymphadenopathy, thyroid nonpalpable.  Skin: Warm and dry, no rashes. Cardiac: Regular rate and rhythm, no murmurs rubs or gallops, no lower extremity edema.  Respiratory: Clear to auscultation bilaterally. Not using accessory muscles, speaking in full sentences.  Procedure: Real-time Ultrasound Guided Injection of left knee Device: GE Logiq E  Verbal informed consent obtained.  Time-out conducted.  Noted no overlying erythema, induration, or other signs of local infection.  Skin prepped in a sterile fashion.  Local anesthesia: Topical Ethyl chloride.  With sterile technique and under real time ultrasound guidance:  1 mL kenalog 40, 2 mL lidocaine, 2 mL bupivacaine injected easily. Completed without difficulty  Pain immediately resolved suggesting accurate placement of the medication.  Advised to call if fevers/chills, erythema,  induration, drainage, or persistent bleeding.  Images permanently stored and available for review in the ultrasound unit.  Impression: Technically successful ultrasound guided injection.  Impression and Recommendations:    Chondromalacia of patellofemoral joint Left knee injection as above. Advised Albin FellingCarla that there'll be no more interventional treatment until her vital signs are stabilized.  Essential hypertension, benign Increased labetalol to 400 mg twice a day at the last visit, she was supposed to continue her amlodipine and hydrochlorothiazide. Has been noncompliant with medications. She will restart the medications and follow-up in 2 weeks to recheck blood pressure.

## 2016-08-07 ENCOUNTER — Other Ambulatory Visit: Payer: Self-pay | Admitting: Sports Medicine

## 2016-08-07 DIAGNOSIS — I10 Essential (primary) hypertension: Secondary | ICD-10-CM

## 2016-08-09 ENCOUNTER — Other Ambulatory Visit: Payer: Self-pay

## 2016-08-09 DIAGNOSIS — I10 Essential (primary) hypertension: Secondary | ICD-10-CM

## 2016-08-09 MED ORDER — METFORMIN HCL 1000 MG PO TABS: ORAL_TABLET | ORAL | 0 refills | Status: DC

## 2016-08-09 MED ORDER — LABETALOL HCL 200 MG PO TABS: 400.0000 mg | ORAL_TABLET | Freq: Two times a day (BID) | ORAL | 3 refills | Status: DC

## 2016-08-12 ENCOUNTER — Other Ambulatory Visit: Payer: Self-pay | Admitting: Sports Medicine

## 2016-08-12 DIAGNOSIS — I10 Essential (primary) hypertension: Secondary | ICD-10-CM

## 2016-08-14 ENCOUNTER — Other Ambulatory Visit: Payer: Self-pay | Admitting: Sports Medicine

## 2016-08-14 DIAGNOSIS — I10 Essential (primary) hypertension: Secondary | ICD-10-CM

## 2016-08-21 ENCOUNTER — Ambulatory Visit (INDEPENDENT_AMBULATORY_CARE_PROVIDER_SITE_OTHER): Payer: BLUE CROSS/BLUE SHIELD | Admitting: Sports Medicine

## 2016-08-21 DIAGNOSIS — I10 Essential (primary) hypertension: Secondary | ICD-10-CM

## 2016-08-21 MED ORDER — VALSARTAN-HYDROCHLOROTHIAZIDE 320-25 MG PO TABS: 1.0000 | ORAL_TABLET | Freq: Every day | ORAL | 3 refills | Status: DC

## 2016-08-21 MED ORDER — METOPROLOL TARTRATE 50 MG PO TABS: 50.0000 mg | ORAL_TABLET | Freq: Two times a day (BID) | ORAL | 3 refills | Status: DC

## 2016-08-21 NOTE — Assessment & Plan Note (Signed)
Currently enrolled in the bariatric surgery program.

## 2016-08-21 NOTE — Progress Notes (Signed)
  Subjective:    CC: Follow-up  HPI: Hypertension: Continues to be elevated, on further questioning she is not going to be able to proceed with in vitro treatment until she loses significant weight. She is currently in the process of being evaluated for bariatric surgery. We have been using less than ideal for hypertension but safer for gestation medications.  Her blood pressure is elevated, she has no headaches, visual changes, chest pain.  Past medical history:  Negative.  See flowsheet/record as well for more information.  Surgical history: Negative.  See flowsheet/record as well for more information.  Family history: Negative.  See flowsheet/record as well for more information.  Social history: Negative.  See flowsheet/record as well for more information.  Allergies, and medications have been entered into the medical record, reviewed, and no changes needed.   Review of Systems: No fevers, chills, night sweats, weight loss, chest pain, or shortness of breath.   Objective:    General: Well Developed, well nourished, and in no acute distress.  Neuro: Alert and oriented x3, extra-ocular muscles intact, sensation grossly intact.  HEENT: Normocephalic, atraumatic, pupils equal round reactive to light, neck supple, no masses, no lymphadenopathy, thyroid nonpalpable.  Skin: Warm and dry, no rashes. Cardiac: Regular rate and rhythm, no murmurs rubs or gallops, no lower extremity edema.  Respiratory: Clear to auscultation bilaterally. Not using accessory muscles, speaking in full sentences.  Impression and Recommendations:    Essential hypertension, benign Blood pressure is still elevated, ultimately we've been using less than ideal medications due to their attempt to get pregnant with in vitro, ultimately the reproductive endocrinologist is not going to proceed until she loses a great deal of weight, she is enrolled in the bariatric surgery program. For this reason we are going to switch her  to more effective blood pressure medications, and my suspicion is that when she loses 100 pounds with bariatric surgery she will be able to come off of the medications which will be safer for her baby. This will also me in a lower chance of preeclampsia, a healthier placenta, and thus a baby more likely to carry to full-term. Discontinue labetalol and hydrochlorothiazide, switching to valsartan/HCTZ combo as well as metoprolol. She will email me daily with blood pressure readings from home.  OBESITY, CLASS III  Currently enrolled in the bariatric surgery program.  I spent 25 minutes with this patient, greater than 50% was face-to-face time counseling regarding the above diagnoses

## 2016-08-21 NOTE — Assessment & Plan Note (Signed)
Blood pressure is still elevated, ultimately we've been using less than ideal medications due to their attempt to get pregnant with in vitro, ultimately the reproductive endocrinologist is not going to proceed until she loses a great deal of weight, she is enrolled in the bariatric surgery program. For this reason we are going to switch her to more effective blood pressure medications, and my suspicion is that when she loses 100 pounds with bariatric surgery she will be able to come off of the medications which will be safer for her baby. This will also me in a lower chance of preeclampsia, a healthier placenta, and thus a baby more likely to carry to full-term. Discontinue labetalol and hydrochlorothiazide, switching to valsartan/HCTZ combo as well as metoprolol. She will email me daily with blood pressure readings from home.

## 2016-08-23 ENCOUNTER — Encounter: Payer: Self-pay | Admitting: Sports Medicine

## 2016-08-25 ENCOUNTER — Encounter: Payer: Self-pay | Admitting: Sports Medicine

## 2016-09-04 ENCOUNTER — Ambulatory Visit (INDEPENDENT_AMBULATORY_CARE_PROVIDER_SITE_OTHER): Payer: BLUE CROSS/BLUE SHIELD | Admitting: Sports Medicine

## 2016-09-04 ENCOUNTER — Encounter: Payer: Self-pay | Admitting: Sports Medicine

## 2016-09-04 VITALS — BP 124/66 | HR 78 | Wt 335.0 lb

## 2016-09-04 DIAGNOSIS — E119 Type 2 diabetes mellitus without complications: Secondary | ICD-10-CM | POA: Diagnosis not present

## 2016-09-04 DIAGNOSIS — I10 Essential (primary) hypertension: Secondary | ICD-10-CM | POA: Diagnosis not present

## 2016-09-04 LAB — POCT GLYCOSYLATED HEMOGLOBIN (HGB A1C): Hemoglobin A1C: 6.2

## 2016-09-04 NOTE — Assessment & Plan Note (Signed)
Well-controlled now on amlodipine, valsartan/HCTZ, metoprolol. I do have her involved in the bariatric surgery program and if this goes through get her off of all of her medicines. We did discuss that fibromyalgia often improves, depression improves, glucose intolerance improves, lipids improve as well.

## 2016-09-04 NOTE — Progress Notes (Signed)
  Subjective:    CC: Follow-up  HPI: Hypertension: Well controlled.  Hyperlipidemia: Stable.  Glucose intolerance: Stable on metformin.  Past medical history:  Negative.  See flowsheet/record as well for more information.  Surgical history: Negative.  See flowsheet/record as well for more information.  Family history: Negative.  See flowsheet/record as well for more information.  Social history: Negative.  See flowsheet/record as well for more information.  Allergies, and medications have been entered into the medical record, reviewed, and no changes needed.   Review of Systems: No fevers, chills, night sweats, weight loss, chest pain, or shortness of breath.   Objective:    General: Well Developed, well nourished, and in no acute distress.  Neuro: Alert and oriented x3, extra-ocular muscles intact, sensation grossly intact.  HEENT: Normocephalic, atraumatic, pupils equal round reactive to light, neck supple, no masses, no lymphadenopathy, thyroid nonpalpable.  Skin: Warm and dry, no rashes. Cardiac: Regular rate and rhythm, no murmurs rubs or gallops, no lower extremity edema.  Respiratory: Clear to auscultation bilaterally. Not using accessory muscles, speaking in full sentences.  Impression and Recommendations:    Essential hypertension, benign Well-controlled now on amlodipine, valsartan/HCTZ, metoprolol. I do have her involved in the bariatric surgery program and if this goes through get her off of all of her medicines. We did discuss that fibromyalgia often improves, depression improves, glucose intolerance improves, lipids improve as well.  I spent 25 minutes with this patient, greater than 50% was face-to-face time counseling regarding the above diagnoses

## 2016-11-08 ENCOUNTER — Encounter: Payer: Self-pay | Admitting: Sports Medicine

## 2016-11-08 ENCOUNTER — Telehealth: Payer: BLUE CROSS/BLUE SHIELD | Admitting: Nurse Practitioner

## 2016-11-08 DIAGNOSIS — N3000 Acute cystitis without hematuria: Secondary | ICD-10-CM

## 2016-11-08 MED ORDER — NITROFURANTOIN MONOHYD MACRO 100 MG PO CAPS: 100.0000 mg | ORAL_CAPSULE | Freq: Two times a day (BID) | ORAL | 0 refills | Status: DC

## 2016-11-08 NOTE — Progress Notes (Signed)

## 2016-11-09 ENCOUNTER — Other Ambulatory Visit: Payer: Self-pay | Admitting: Sports Medicine

## 2016-11-22 ENCOUNTER — Other Ambulatory Visit: Payer: Self-pay | Admitting: Sports Medicine

## 2016-11-22 DIAGNOSIS — I10 Essential (primary) hypertension: Secondary | ICD-10-CM

## 2016-12-17 ENCOUNTER — Other Ambulatory Visit: Payer: Self-pay | Admitting: Sports Medicine

## 2016-12-17 DIAGNOSIS — I10 Essential (primary) hypertension: Secondary | ICD-10-CM

## 2017-02-03 ENCOUNTER — Other Ambulatory Visit: Payer: Self-pay | Admitting: Sports Medicine

## 2017-02-03 DIAGNOSIS — M7918 Myalgia, other site: Secondary | ICD-10-CM

## 2017-02-07 ENCOUNTER — Other Ambulatory Visit: Payer: Self-pay | Admitting: Sports Medicine

## 2017-02-20 ENCOUNTER — Other Ambulatory Visit: Payer: Self-pay

## 2017-02-20 DIAGNOSIS — I10 Essential (primary) hypertension: Secondary | ICD-10-CM

## 2017-02-20 MED ORDER — VALSARTAN-HYDROCHLOROTHIAZIDE 320-25 MG PO TABS: 1.0000 | ORAL_TABLET | Freq: Every day | ORAL | 1 refills | Status: DC

## 2017-02-20 MED ORDER — METOPROLOL TARTRATE 50 MG PO TABS: 50.0000 mg | ORAL_TABLET | Freq: Two times a day (BID) | ORAL | 1 refills | Status: DC

## 2017-03-05 ENCOUNTER — Ambulatory Visit: Payer: BLUE CROSS/BLUE SHIELD | Admitting: Sports Medicine

## 2017-03-16 ENCOUNTER — Other Ambulatory Visit: Payer: Self-pay | Admitting: Sports Medicine

## 2017-03-22 ENCOUNTER — Other Ambulatory Visit: Payer: Self-pay | Admitting: Sports Medicine

## 2017-03-22 DIAGNOSIS — I10 Essential (primary) hypertension: Secondary | ICD-10-CM

## 2017-03-24 ENCOUNTER — Telehealth: Payer: Self-pay | Admitting: Sports Medicine

## 2017-03-24 MED ORDER — IRBESARTAN-HYDROCHLOROTHIAZIDE 300-12.5 MG PO TABS: 1.0000 | ORAL_TABLET | Freq: Every day | ORAL | 3 refills | Status: DC

## 2017-03-24 NOTE — Telephone Encounter (Signed)
Valsartan/HCTZ is on recall, switching to irbesartan/HCTZ maximum dose.  Patient to check her blood pressures at home.

## 2017-03-31 ENCOUNTER — Encounter: Payer: Self-pay | Admitting: Sports Medicine

## 2017-04-10 ENCOUNTER — Ambulatory Visit: Payer: BLUE CROSS/BLUE SHIELD | Admitting: Sports Medicine

## 2017-04-10 ENCOUNTER — Encounter: Payer: Self-pay | Admitting: Surgery

## 2017-04-10 ENCOUNTER — Other Ambulatory Visit (HOSPITAL_COMMUNITY): Payer: Self-pay | Admitting: Surgery

## 2017-04-10 DIAGNOSIS — M7918 Myalgia, other site: Secondary | ICD-10-CM | POA: Diagnosis not present

## 2017-04-10 DIAGNOSIS — I10 Essential (primary) hypertension: Secondary | ICD-10-CM | POA: Diagnosis not present

## 2017-04-10 DIAGNOSIS — Z0000 Encounter for general adult medical examination without abnormal findings: Secondary | ICD-10-CM

## 2017-04-10 MED ORDER — DULOXETINE HCL 60 MG PO CPEP: 120.0000 mg | ORAL_CAPSULE | Freq: Every day | ORAL | 1 refills | Status: DC

## 2017-04-10 NOTE — Assessment & Plan Note (Signed)
Currently in the bariatric surgery pathway

## 2017-04-10 NOTE — Assessment & Plan Note (Signed)
Catching her up on routine labs.

## 2017-04-10 NOTE — Assessment & Plan Note (Signed)
Refilling Cymbalta, full 120 mg.

## 2017-04-10 NOTE — Progress Notes (Signed)
Subjective:    CC: Follow-up on medications  HPI: Hypertension: Well-controlled.  Myofascial pain syndrome/depression/fibromyalgia: For some reason dropped her Cymbalta dose in half.  No suicidal or homicidal ideation  Obesity: Currently in the pathway for bariatric surgery.   I reviewed the past medical history, family history, social history, surgical history, and allergies today and no changes were needed.  Please see the problem list section below in epic for further details.  Past Medical History: Past Medical History:  Diagnosis Date  . Depression   . Endometrial polyp   . History of recurrent UTIs   . Hypertension   . Infertility, anovulation   . Myofascial muscle pain   . OA (osteoarthritis) of knee    BILATERAL  . PCOS (polycystic ovarian syndrome)   . PONV (postoperative nausea and vomiting)   . Seasonal allergic rhinitis    Past Surgical History: Past Surgical History:  Procedure Laterality Date  . HYSTEROSCOPY W/D&C N/A 09/07/2014   Procedure:  DILATATION AND CURETTAGE /HYSTEROSCOPY/POLYPECTOMY;  Surgeon: Fermin Schwab, MD;  [facility];  Service: Gynecology;  Laterality: N/A;  . I & D RIGHT FOOT  1998  . POLYPECTOMY     uterine  . TONSILLECTOMY  2001  . WISDOM TOOTH EXTRACTION  2004   Social History: Social History   Socioeconomic History  . Marital status: Married    Spouse name: None  . Number of children: None  . Years of education: None  . Highest education level: None  Social Needs  . Financial resource strain: None  . Food insecurity - worry: None  . Food insecurity - inability: None  . Transportation needs - medical: None  . Transportation needs - non-medical: None  Occupational History  . Occupation: distributer  Tobacco Use  . Smoking status: Never Smoker  . Smokeless tobacco: Never Used  Substance and Sexual Activity  . Alcohol use: No    Alcohol/week: 0.0 oz  . Drug use: No  . Sexual activity: Not  Currently    Partners: Female  Other Topics Concern  . None  Social History Narrative  . None   Family History: Family History  Problem Relation Age of Onset  . Diabetes Mother   . Polycystic ovary syndrome Mother   . Hypertension Father   . Diabetes Father   . Fibromyalgia Maternal Aunt   . Fibromyalgia Cousin   . Cancer Paternal Grandfather        prostate  . Glaucoma Paternal Grandmother    Allergies: Allergies  Allergen Reactions  . Amoxicillin Other (See Comments)    Caused yeast infection  . Ceclor [Cefaclor] Other (See Comments)    Caused yeast infection  . Enalapril Maleate Swelling    REACTION: lip swelling  . Erythromycin Diarrhea  . Penicillins Other (See Comments)    Caused yeast infection  . Sulfa Antibiotics    Medications: See med rec.  Review of Systems: No fevers, chills, night sweats, weight loss, chest pain, or shortness of breath.   Objective:    General: Well Developed, well nourished, and in no acute distress.  Neuro: Alert and oriented x3, extra-ocular muscles intact, sensation grossly intact.  HEENT: Normocephalic, atraumatic, pupils equal round reactive to light, neck supple, no masses, no lymphadenopathy, thyroid nonpalpable.  Skin: Warm and dry, no rashes. Cardiac: Regular rate and rhythm, no murmurs rubs or gallops, no lower extremity edema.  Respiratory: Clear to auscultation bilaterally. Not using accessory muscles, speaking in full sentences.  Impression and Recommendations:  Annual physical exam Catching her up on routine labs.  Essential hypertension, benign Blood pressure is well controlled.  OBESITY, CLASS III Currently in the bariatric surgery pathway  Myofascial pain syndrome with depression Refilling Cymbalta, full 120 mg.  ___________________________________________ Ihor Austinhomas J. Benjamin Stainhekkekandam, M.D., ABFM., CAQSM. Primary Care and Sports Medicine [facility]  Adjunct Instructor of Family  Medicine  [employer]

## 2017-04-10 NOTE — Assessment & Plan Note (Signed)
Blood pressure is well controlled 

## 2017-04-23 DIAGNOSIS — F509 Eating disorder, unspecified: Secondary | ICD-10-CM | POA: Diagnosis not present

## 2017-04-24 DIAGNOSIS — I10 Essential (primary) hypertension: Secondary | ICD-10-CM | POA: Diagnosis not present

## 2017-04-24 DIAGNOSIS — Z0000 Encounter for general adult medical examination without abnormal findings: Secondary | ICD-10-CM | POA: Diagnosis not present

## 2017-04-25 ENCOUNTER — Encounter: Payer: Self-pay | Admitting: Sports Medicine

## 2017-04-25 DIAGNOSIS — E119 Type 2 diabetes mellitus without complications: Secondary | ICD-10-CM | POA: Insufficient documentation

## 2017-04-25 LAB — COMPREHENSIVE METABOLIC PANEL
AG Ratio: 1.3 (calc) (ref 1.0–2.5)
AST: 20 U/L (ref 10–30)
Alkaline phosphatase (APISO): 93 U/L (ref 33–115)
CO2: 31 mmol/L (ref 20–32)
Calcium: 8.7 mg/dL (ref 8.6–10.2)
Chloride: 102 mmol/L (ref 98–110)
Creat: 0.66 mg/dL (ref 0.50–1.10)
Globulin: 2.7 g/dL (calc) (ref 1.9–3.7)
Total Bilirubin: 0.4 mg/dL (ref 0.2–1.2)

## 2017-04-25 LAB — CBC
HCT: 38.9 % (ref 35.0–45.0)
Hemoglobin: 13.3 g/dL (ref 11.7–15.5)
MCH: 26.6 pg — ABNORMAL LOW (ref 27.0–33.0)
MCHC: 34.2 g/dL (ref 32.0–36.0)
MCV: 77.8 fL — ABNORMAL LOW (ref 80.0–100.0)
MPV: 11.4 fL (ref 7.5–12.5)
Platelets: 238 10*3/uL (ref 140–400)
RBC: 5 10*6/uL (ref 3.80–5.10)
RDW: 14.2 % (ref 11.0–15.0)
WBC: 9.2 Thousand/uL (ref 3.8–10.8)

## 2017-04-25 LAB — COMPREHENSIVE METABOLIC PANEL WITH GFR
ALT: 26 U/L (ref 6–29)
Albumin: 3.6 g/dL (ref 3.6–5.1)
BUN: 8 mg/dL (ref 7–25)
Glucose, Bld: 140 mg/dL — ABNORMAL HIGH (ref 65–99)
Potassium: 3.9 mmol/L (ref 3.5–5.3)
Sodium: 139 mmol/L (ref 135–146)
Total Protein: 6.3 g/dL (ref 6.1–8.1)

## 2017-04-25 LAB — TSH: TSH: 2.4 m[IU]/L

## 2017-04-25 LAB — LIPID PANEL W/REFLEX DIRECT LDL
Cholesterol: 178 mg/dL (ref <200)
HDL: 34 mg/dL — ABNORMAL LOW (ref >50)
LDL Cholesterol (Calc): 114 mg/dL — ABNORMAL HIGH
Non-HDL Cholesterol (Calc): 144 mg/dL — ABNORMAL HIGH (ref <130)
Total CHOL/HDL Ratio: 5.2 (calc) — ABNORMAL HIGH (ref <5.0)
Triglycerides: 182 mg/dL — ABNORMAL HIGH (ref <150)

## 2017-04-25 LAB — HEMOGLOBIN A1C
Hgb A1c MFr Bld: 7 %{Hb} — ABNORMAL HIGH (ref <5.7)
Mean Plasma Glucose: 154 (calc)
eAG (mmol/L): 8.5 (calc)

## 2017-04-25 LAB — HIV ANTIBODY (ROUTINE TESTING W REFLEX): HIV 1&2 Ab, 4th Generation: NONREACTIVE

## 2017-05-01 ENCOUNTER — Encounter: Payer: BLUE CROSS/BLUE SHIELD | Attending: Surgery | Admitting: Skilled Nursing Facility1

## 2017-05-01 ENCOUNTER — Encounter: Payer: Self-pay | Admitting: Skilled Nursing Facility1

## 2017-05-01 DIAGNOSIS — Z713 Dietary counseling and surveillance: Secondary | ICD-10-CM | POA: Insufficient documentation

## 2017-05-01 DIAGNOSIS — E119 Type 2 diabetes mellitus without complications: Secondary | ICD-10-CM

## 2017-05-01 NOTE — Progress Notes (Addendum)
Pre-Op Assessment Visit:  Pre-Operative Sleeve Gastrectomy Surgery  Medical Nutrition Therapy:  Appt start time: 2:45  End time: 3:55  Patient was seen on 05/01/2017 for Pre-Operative Nutrition Assessment. Assessment and [facility] of approval [facility] coordinator on 05/01/2017.  Pt needs 3 months SWL.  Pt arrives with her wife.Pt states she was taking metformin but with diarrhea she stopped it. Pt states she was diagnosed with diabetes. Pt complains of polyuria and polydipsia every night. Pt states she has issues going to sleep. Pt states she does not check her blood sugar. Pt goes long periods of times without eating. Pts A1C 7.  For next time: diabetes education.  Pt given meter: one touch verio flex lot: [id number] x exp: 04/25/18 reading of 119 fasting   Pt expectation of surgery: internal fullness cues   Pt expectation of Dietitian: I don't know  Start weight at [facility]: 348.6 BMI: 53  24 hr Dietary Recall: First Meal: skipped---cereal or oatmeal Snack: chips Second Meal: sandwich or frozen pizza or snadwich and chips Snack: candy Third Meal: salsbury steak mashed potatoes and corn or spaghetti  Snack: ice cream  Beverages: dr pepper, sweet tea, apple juice, milk   Encouraged to engage in 150 minutes of moderate physical activity including cardiovascular and weight baring weekly  Handouts given during visit include:  . Pre-Op Goals . Bariatric Surgery Protein Shakes During the appointment today the following Pre-Op Goals were reviewed with the patient: . Maintain or lose weight as instructed by your surgeon . Make healthy food choices . Begin to limit portion sizes . Limited concentrated sugars and fried foods . Keep fat/sugar in the single digits per serving on             food labels . Practice CHEWING your food  (aim for 30 chews per bite or until applesauce consistency) . Practice not drinking 15 minutes before, during, and 30  minutes after each meal/snack . Avoid all carbonated beverages  . Avoid/limit caffeinated beverages  . Avoid all sugar-sweetened beverages . Consume 3 meals per day; eat every 3-5 hours . Make a list of non-food related activities . Aim for 64-100 ounces of FLUID daily  . Aim for at least 60-80 grams of PROTEIN daily . Look for a liquid protein source that contain ?15 g protein and ?5 g carbohydrate  (ex: shakes, drinks, shots) . Work on lifting legs and getting up more often for activity  -Always bring your meter with you everywhere you go -Always Properly dispose of your needles:  -Discard in a hard plastic/metal container with a lid (something the needle can't puncture)  -Write Do Not Recycle on the outside of the container  -Example: A laundry detergent bottle -Never use the same needle more than once -A snack: A Fruit OR Vegetable AND Protein  -Try to be more active -Always pay attention to your body keeping watchful of possible low blood sugar (below 70) or high blood sugar (above 200)  -Check your feet every day looking for anything that was not there the day before  -Fasting: 80-130 and 2 hours after a meal: 80-180  -Follow diet recommendations listed below   Energy and Macronutrient Recomendations: Calories: 1500 Carbohydrate: 170 Protein: 112 Fat: 42  Demonstrated degree of understanding via:  Teach Back  Teaching Method Utilized:  Visual Auditory Hands on  Patient to call the Nutrition and Diabetes Education Services to enroll in Pre-Op and Post-Op Nutrition Education when surgery date is  scheduled.

## 2017-05-01 NOTE — Patient Instructions (Signed)
-  Always bring your meter with you everywhere you go -Always Properly dispose of your needles:  -Discard in a hard plastic/metal container with a lid (something the needle can't puncture)  -Write Do Not Recycle on the outside of the container  -Example: A laundry detergent bottle -Never use the same needle more than once -A snack: A Fruit OR Vegetable AND Protein  -Try to be more active -Always pay attention to your body keeping watchful of possible low blood sugar (below 70) or high blood sugar (above 200)  -Check your feet every day looking for anything that was not there the day before  -Fasting: 80-130 and 2 hours after a meal: 80-180

## 2017-05-14 ENCOUNTER — Encounter: Payer: Self-pay | Admitting: Neurology

## 2017-05-14 ENCOUNTER — Ambulatory Visit: Payer: BLUE CROSS/BLUE SHIELD | Admitting: Neurology

## 2017-05-14 VITALS — BP 145/83 | HR 84 | Ht 68.0 in | Wt 352.0 lb

## 2017-05-14 DIAGNOSIS — R51 Headache: Secondary | ICD-10-CM | POA: Diagnosis not present

## 2017-05-14 DIAGNOSIS — R519 Headache, unspecified: Secondary | ICD-10-CM

## 2017-05-14 DIAGNOSIS — R0683 Snoring: Secondary | ICD-10-CM

## 2017-05-14 DIAGNOSIS — Z6841 Body Mass Index (BMI) 40.0 and over, adult: Secondary | ICD-10-CM

## 2017-05-14 DIAGNOSIS — R351 Nocturia: Secondary | ICD-10-CM

## 2017-05-14 NOTE — Progress Notes (Signed)
Subjective:    Patient ID: Chelsea [name] is a 32 y.o. female.  HPI     Huston Foley, MD, PhD [facility] [address], [address] [address] [place], [place] [address]  Dear Dr. Fredricka Bonine,   I saw your patient, Chelsea [name], upon your kind request in my neurologic clinic today for initial consultation of her sleep disorder, in particular, concern for underlying obstructive sleep apnea. The patient is accompanied by her wife, Chelsea [name], today. As you know, Chelsea [name] is a 32 year old right-handed woman with an underlying medical history of polycystic ovarian syndrome, fibromyalgia, hypertension, history of depression, migraine headaches, and morbid obesity with a BMI of over 50, who reports snoring and excessive daytime somnolence. I reviewed your office note from 04/10/2017, which you kindly included. Her Epworth sleepiness score is 3 out of 24, fatigue scores 48 of 63. She lives with her spouse in parents, she is self-employed, they have no children. She is a nonsmoker and drinks alcohol rarely, maybe once a year or so, caffeine significantly in the form of sweet tea and sodas, a lot by self-report. She has nocturia about twice per average night, she has morning headaches occasionally. She has a family history of OSA and her maternal aunt and she suspects also in her father. Bedtime is around 11 or 11:30 PM but she has trouble going to sleep and sometimes does not sleep until 3 AM. She feels tired and not well rested typically. She has a history of infrequent migraines, no longer on Topamax.  Her Past Medical History Is Significant For: Past Medical History:  Diagnosis Date  . Depression   . Endometrial polyp   . History of recurrent UTIs   . Hypertension   . Infertility, anovulation   . Myofascial muscle pain   . OA (osteoarthritis) of knee    BILATERAL  . PCOS (polycystic ovarian syndrome)   . PONV (postoperative nausea and vomiting)   . Seasonal allergic  rhinitis     Her Past Surgical History Is Significant For: Past Surgical History:  Procedure Laterality Date  . HYSTEROSCOPY W/D&C N/A 09/07/2014   Procedure:  DILATATION AND CURETTAGE /HYSTEROSCOPY/POLYPECTOMY;  Surgeon: Fermin Schwab, MD;  Location: [facility];  Service: Gynecology;  Laterality: N/A;  . I & D RIGHT FOOT  1998  . POLYPECTOMY     uterine  . TONSILLECTOMY  2001  . WISDOM TOOTH EXTRACTION  2004    Her Family History Is Significant For: Family History  Problem Relation Age of Onset  . Diabetes Mother   . Polycystic ovary syndrome Mother   . Hypertension Father   . Diabetes Father   . Fibromyalgia Maternal Aunt   . Fibromyalgia Cousin   . Cancer Paternal Grandfather        prostate  . Glaucoma Paternal Grandmother     Her Social History Is Significant For: Social History   Socioeconomic History  . Marital status: Married    Spouse name: None  . Number of children: None  . Years of education: None  . Highest education level: None  Social Needs  . Financial resource strain: None  . Food insecurity - worry: None  . Food insecurity - inability: None  . Transportation needs - medical: None  . Transportation needs - non-medical: None  Occupational History  . Occupation: distributer  Tobacco Use  . Smoking status: Never Smoker  . Smokeless tobacco: Never Used  Substance and Sexual Activity  . Alcohol use: No  Alcohol/week: 0.0 oz  . Drug use: No  . Sexual activity: Not Currently    Partners: Female  Other Topics Concern  . None  Social History Narrative  . None    Her Allergies Are:  Allergies  Allergen Reactions  . Amoxicillin Other (See Comments)    Caused yeast infection  . Ceclor [Cefaclor] Other (See Comments)    Caused yeast infection  . Enalapril Maleate Swelling    REACTION: lip swelling  . Erythromycin Diarrhea  . Penicillins Other (See Comments)    Caused yeast infection  . Sulfa Antibiotics   :   Her  Current Medications Are:  Outpatient Encounter Medications as of 05/14/2017  Medication Sig  . amLODipine (NORVASC) 10 MG tablet TAKE 1 TABLET BY MOUTH EVERY DAY  . DULoxetine (CYMBALTA) 60 MG capsule Take 2 capsules (120 mg total) by mouth daily.  . metoprolol tartrate (LOPRESSOR) 50 MG tablet Take 1 tablet (50 mg total) by mouth 2 (two) times daily.  . rizatriptan (MAXALT) 10 MG tablet Take 1 tablet (10 mg total) by mouth as needed for migraine. May repeat in 2 hours if needed  . triamcinolone (KENALOG) 0.1 % paste Use as directed 1 application in the mouth or throat 2 (two) times daily.  . valACYclovir (VALTREX) 1000 MG tablet Take 1 tablet (1,000 mg total) by mouth 2 (two) times daily.  . valsartan-hydrochlorothiazide (DIOVAN-HCT) 320-25 MG tablet Take 1 tablet by mouth daily.   No facility-administered encounter medications on file as of 05/14/2017.   :  Review of Systems:  Out of a complete 14 point review of systems, all are reviewed and negative with the exception of these symptoms as listed below: Review of Systems  Neurological:       Patient is trying to get bariatric surgery scheduled.  Epworth Sleepiness Scale 0= would never doze 1= slight chance of dozing 2= moderate chance of dozing 3= high chance of dozing  Sitting and reading:1 Watching TV: 0 Sitting inactive in a public place (ex. Theater or meeting): 0 As a passenger in a car for an hour without a break: 1 Lying down to rest in the afternoon: 1 Sitting and talking to someone: 0 Sitting quietly after lunch (no alcohol): 0 In a car, while stopped in traffic: 0 Total: 3     Objective:  Neurological Exam  Physical Exam Physical Examination:   Vitals:   05/14/17 1545  BP: (!) 145/83  Pulse: 84    General Examination: The patient is a very pleasant 32 y.o. female in no acute distress. She appears well-developed and well-nourished and well groomed.   HEENT: Normocephalic, atraumatic, pupils are equal,  round and reactive to light and accommodation. Extraocular tracking is good without limitation to gaze excursion or nystagmus noted. Normal smooth pursuit is noted. Hearing is grossly intact. Face is symmetric with normal facial animation and normal facial sensation. Speech is clear with no dysarthria noted. There is no hypophonia. There is no lip, neck/head, jaw or voice tremor. Neck is supple with full range of passive and active motion. There are no carotid bruits on auscultation. Oropharynx exam reveals: mild mouth dryness, adequate dental hygiene and mild airway crowding, due to smaller airway. Mallampati is class II. Tongue protrudes centrally and palate elevates symmetrically. Tonsils are absent. Neck size is 20 7/8 inches. She has a Mild overbite.  Chest: Clear to auscultation without wheezing, rhonchi or crackles noted.  Heart: S1+S2+0, regular and normal without murmurs, rubs or gallops noted.  Abdomen: Soft, non-tender and non-distended with normal bowel sounds appreciated on auscultation.  Extremities: There is no pitting edema in the distal lower extremities bilaterally. Pedal pulses are intact.  Skin: Warm and dry without trophic changes noted.  Musculoskeletal: exam reveals no obvious joint deformities, tenderness or joint swelling or erythema.   Neurologically:  Mental status: The patient is awake, alert and oriented in all 4 spheres. Her immediate and remote memory, attention, language skills and fund of knowledge are appropriate. There is no evidence of aphasia, agnosia, apraxia or anomia. Speech is clear with normal prosody and enunciation. Thought process is linear. Mood is normal and affect is normal.  Cranial nerves II - XII are as described above under HEENT exam. In addition: shoulder shrug is normal with equal shoulder height noted. Motor exam: Normal bulk, strength and tone is noted. There is no drift, tremor or rebound. Romberg is negative. Reflexes are 1+ throughout.  Fine motor skills and coordination: grossly intact.  Cerebellar testing: No dysmetria or intention tremor on finger to nose testing. Heel to shin is unremarkable bilaterally. There is no truncal or gait ataxia.  Sensory exam: intact to light touch in the upper and lower extremities.  Gait, station and balance: She stands easily. No veering to one side is noted. No leaning to one side is noted. Posture is age-appropriate and stance is narrow based. Gait shows normal stride length and normal pace. No problems turning are noted.   Assessment and Plan:  In summary, Chelsea [name] is a very pleasant 32 y.o.-year old female with an underlying medical history of polycystic ovarian syndrome, fibromyalgia, hypertension, history of depression, migraine headaches, and morbid obesity with a BMI of over 50, whose history and physical exam are concerning for obstructive sleep apnea (OSA).  I had a long chat with the patient and Chelsea [name] about my findings and the diagnosis of OSA, its prognosis and treatment options. We talked about medical treatments, surgical interventions and non-pharmacological approaches. I explained in particular the risks and ramifications of untreated moderate to severe OSA, especially with respect to developing cardiovascular disease down the Road, including congestive heart failure, difficult to treat hypertension, cardiac arrhythmias, or stroke. Even type 2 diabetes has, in part, been linked to untreated OSA. Symptoms of untreated OSA include daytime sleepiness, memory problems, mood irritability and mood disorder such as depression and anxiety, lack of energy, as well as recurrent headaches, especially morning headaches. We talked about trying to maintain a healthy lifestyle in general, as well as the importance of weight control. I encouraged the patient to eat healthy, exercise daily and keep well hydrated, to keep a scheduled bedtime and wake time routine, to not skip any meals and eat healthy  snacks in between meals. I advised the patient not to drive when feeling sleepy.  I recommended the following at this time: sleep study with potential positive airway pressure titration. (We will score hypopneas at 3%).   I explained the sleep test procedure to the patient and also outlined possible surgical and non-surgical treatment options of OSA, including the use of a custom-made dental device (which would require a referral to a specialist dentist or oral surgeon), upper airway surgical options, such as pillar implants, radiofrequency surgery, tongue base surgery, and UPPP (which would involve a referral to an ENT surgeon). Rarely, jaw surgery such as mandibular advancement may be considered.  I also explained the CPAP treatment option to the patient, who indicated that she would be willing to try CPAP if  the need arises. I explained the importance of being compliant with PAP treatment, not only for insurance purposes but primarily to improve Her symptoms, and for the patient's long term health benefit, including to reduce Her cardiovascular risks and improvement of perioperative risk. I answered all their questions today and the patient and Chelsea [name] were in agreement. I would like to see her back after the sleep study is completed and encouraged her to call with any interim questions, concerns, problems or updates.   Thank you very much for allowing me to participate in the care of this nice patient. If I can be of any further assistance to you please do not hesitate to call me at [phone].  Sincerely,   Huston Foley, MD, PhD

## 2017-05-14 NOTE — Patient Instructions (Signed)

## 2017-05-15 ENCOUNTER — Telehealth: Payer: Self-pay

## 2017-05-15 NOTE — Telephone Encounter (Signed)
[employer] denied in lab sleep study, need HST order 

## 2017-05-28 ENCOUNTER — Ambulatory Visit (INDEPENDENT_AMBULATORY_CARE_PROVIDER_SITE_OTHER): Payer: BLUE CROSS/BLUE SHIELD | Admitting: Neurology

## 2017-05-28 DIAGNOSIS — G4733 Obstructive sleep apnea (adult) (pediatric): Secondary | ICD-10-CM

## 2017-05-28 DIAGNOSIS — F509 Eating disorder, unspecified: Secondary | ICD-10-CM | POA: Diagnosis not present

## 2017-05-29 ENCOUNTER — Ambulatory Visit: Payer: BLUE CROSS/BLUE SHIELD | Admitting: Skilled Nursing Facility1

## 2017-06-03 ENCOUNTER — Telehealth: Payer: Self-pay

## 2017-06-03 NOTE — Procedures (Signed)
[facility] [address] [place], [place] [id number] NAME:    Chelsea LankCarla Vessell                                                        DOB: 1985/07/02 MEDICAL RECORD [id number]                                                DOS: 05/28/17 REFERRING PHYSICIAN: Dr. Fredricka Bonineonnor STUDY PERFORMED: Home Sleep test HISTORY: 32 year old woman with a history of polycystic ovarian syndrome, fibromyalgia, hypertension, history of depression, migraine headaches, and morbid obesity, who reports snoring and excessive daytime somnolence. Her Epworth sleepiness score is 3 out of 24. BMI: 53.5  STUDY RESULTS:  Total Recording Time:  10 hours, 55 minutes (valid test time: 8 hours, 55 minutes) Total Apnea/Hypopnea Index (AHI):  8.7/h, RDI: 11.4/h Average Oxygen Saturation: 95%, Lowest Oxygen Desaturation: 86%  Total Time Oxygen Saturation Below or at 88%: 2.0 minutes  Average Heart Rate: 88 bpm  IMPRESSION: OSA RECOMMENDATION: This home sleep test demonstrates overall mild obstructive sleep apnea with a total AHI of 8.7/hour and O2 nadir of 86%. Given the patient's medical history and sleep related complaints, and in light of planned weight loss surgery, treatment with positive airway pressure is recommended. This can be achieved in the form of autoPAP trial/titration at home. A full night CPAP titration study will help with proper treatment settings and mask fitting if needed.  Please note that untreated obstructive sleep apnea may carry additional perioperative morbidity. Patients with significant obstructive sleep apnea should receive perioperative PAP therapy and the surgeons and particularly the anesthesiologist should be informed of the diagnosis and the severity of the sleep disordered breathing. The patient should be cautioned not to drive, work at heights, or operate dangerous or heavy equipment when tired or sleepy. Review and reiteration of good sleep hygiene measures should be  pursued with any patient. Other causes of the patient's symptoms, including circadian rhythm disturbances, an underlying mood disorder, medication effect and/or an underlying medical problem cannot be ruled out based on this test. Clinical correlation is recommended. The patient and his referring provider will be notified of the test results. The patient will be [facility].  I certify that I have reviewed the raw data recording prior to the issuance of this report in accordance with the standards of the American Academy of Sleep Medicine (AASM).  Huston FoleySaima Hien Perreira, MD, PhD [facility] Diplomat, ABPN (Neurology and Sleep)

## 2017-06-03 NOTE — Telephone Encounter (Signed)
I called pt to discuss her sleep study results. No answer, left a message asking her to call me back. 

## 2017-06-03 NOTE — Telephone Encounter (Signed)
-----   Message from Huston FoleySaima Athar, MD sent at 06/03/2017  2:04 PM EDT ----- Patient referred by Dr. Fredricka Bonineonnor in surgery, seen by me on 05/14/17, HST on 05/28/17.    Please call and notify the patient that the recent home sleep test showed obstructive sleep apnea. OSA is overall mild, but worth treating to see if she feels better after treatment and in light of planned weight loss surgery I recommend treating even mild OSA. To that end I recommend treatment for this in the form of autoPAP, which means, that we don't have to bring her in for a sleep study with CPAP, but will let her try an autoPAP machine at home, through a DME company (of her choice, or as per insurance requirement). The DME representative will educate her on how to use the machine, how to put the mask on, etc. I have placed an order in the chart. Please send referral, talk to patient, send report to referring MD. We will need a FU in sleep clinic for 10 weeks post-PAP set up, please arrange that with me or one of our NPs. Thanks,   Huston FoleySaima Athar, MD, PhD [facility])

## 2017-06-03 NOTE — Addendum Note (Signed)
Addended by: Huston FoleyATHAR, Olivier Frayre on: 06/03/2017 02:04 PM   Modules accepted: Orders

## 2017-06-03 NOTE — Progress Notes (Signed)
Patient referred by Dr. Fredricka Bonineonnor in surgery, seen by me on 05/14/17, HST on 05/28/17.    Please call and notify the patient that the recent home sleep test showed obstructive sleep apnea. OSA is overall mild, but worth treating to see if she feels better after treatment and in light of planned weight loss surgery I recommend treating even mild OSA. To that end I recommend treatment for this in the form of autoPAP, which means, that we don't have to bring her in for a sleep study with CPAP, but will let her try an autoPAP machine at home, through a DME company (of her choice, or as per insurance requirement). The DME representative will educate her on how to use the machine, how to put the mask on, etc. I have placed an order in the chart. Please send referral, talk to patient, send report to referring MD. We will need a FU in sleep clinic for 10 weeks post-PAP set up, please arrange that with me or one of our NPs. Thanks,   Chelsea FoleySaima Esau Fridman, MD, PhD [facility])

## 2017-06-04 NOTE — Telephone Encounter (Signed)
I called pt. I advised pt that Dr. Frances FurbishAthar reviewed their sleep study results and found that pt has mild osa. Dr. Frances FurbishAthar recommends that pt start an auto pap at home. I reviewed PAP compliance expectations with the pt. Pt is agreeable to starting an auto-PAP. I advised pt that an order will be sent to a DME, [facility], and [employer] will call the pt within about one week after they file with the pt's insurance. [employer] will show the pt how to use the machine, fit for masks, and troubleshoot the auto-PAP if needed. A follow up appt was made for insurance purposes with Dr. Frances FurbishAthar on 09/11/17 at 2:00pm. Pt verbalized understanding to arrive 15 minutes early and bring their auto-PAP. A letter with all of this information in it will be sent to the pt's mychart as a reminder. I verified with the pt that the address we have on file is correct. Pt verbalized understanding of results. Pt had no questions at this time but was encouraged to call back if questions arise.

## 2017-06-05 ENCOUNTER — Encounter: Payer: Self-pay | Admitting: Skilled Nursing Facility1

## 2017-06-05 ENCOUNTER — Encounter: Payer: BLUE CROSS/BLUE SHIELD | Attending: Surgery | Admitting: Skilled Nursing Facility1

## 2017-06-05 DIAGNOSIS — E119 Type 2 diabetes mellitus without complications: Secondary | ICD-10-CM

## 2017-06-05 DIAGNOSIS — Z713 Dietary counseling and surveillance: Secondary | ICD-10-CM | POA: Insufficient documentation

## 2017-06-05 NOTE — Patient Instructions (Addendum)
-  Check your blood sugar when you are feeling really thirsty and having to pee a lot  [email or web address] [phone]([phone])  Keep working on not drinking with meals  Continue to work on cutting out soda  Try unsweet tea with Splenda   Continue to work on chewing well  Try to eat every 3-5 hours

## 2017-06-05 NOTE — Progress Notes (Signed)
Sleeve Gastrectomy  Assessment:   1 SWL Appointment.   Pt needs 3 months SWL.  Pt arrives with her wife. Mild Sleep apnea that she may or may not treat. Pt states her phycologist Dr. Cyndia SkeetersLurey says she needs more mental health appointment but pt states she does not want to work with him. Pt states her diabetes is controlled with an A1C of 7 with numbers of 119-124; and 2 hours after eating: 184. Pt states she has stopped taking the metformin. Pt states she has been working on not drinking with meals but it has been hard because she is always afraid of choaking and states it is a big change. Baby. Pt staese she keeps her niece 2 nights a weeks, her sister and their baby will be moving in with her soon.  Pts wife is extremely talkative.   Start weight at [facility]: 348.6 BMI: 53  MEDICATIONS: See List   24 hr Dietary Recall: First Meal: skipped---cereal or oatmeal or bagel Snack: chips Second Meal: sandwich or frozen pizza or snadwich and chips or tuna salad sandwich or canned ravioli and applesauce  Snack: candy Third Meal: salsbury steak mashed potatoes and corn or spaghetti or chicken casserole and green beans Snack: ice cream  Beverages: dr pepper, sweet tea, apple juice, milk, water, sprie  Encouraged to engage in 150 minutes of moderate physical activity including cardiovascular and weight baring weekly  Diet to Follow: 1500 calories 170 g carbohydrates 112 g protein 42 g fat   Nutritional Diagnosis:  [place]-3.3 Overweight/obesity related to past poor dietary habits and physical inactivity as evidenced by patient w/ planned Sleeve surgery following dietary guidelines for continued weight loss.    Intervention:  Nutrition counseling for upcoming Bariatric Surgery. Goals: -Encouraged to engage in 150 minutes of moderate physical activity including cardiovascular and weight baring weekly -Check your blood sugar when you are feeling really thirsty and having to pee a  lot [email or web address]/[email or web address]/ [facility] [phone]) Keep working on not drinking with meals Continue to work on cutting out soda Try unsweet tea with Splenda  Continue to work on chewing well Try to eat every 3-5 hours   Teaching Method Utilized:  Visual Auditory Hands on  Handouts given during visit include:  Living well with diabetes  Barriers to learning/adherence to lifestyle change: none identified   Demonstrated degree of understanding via:  Teach Back   Monitoring/Evaluation:  Dietary intake, exercise, and body weight prn.

## 2017-06-05 NOTE — Telephone Encounter (Signed)
FYI Pt has called back stating she wants to have her weight loss surgery 1st to see if that will help.  Pt will cancel appointment for now and call back if there is still the need to move forward.  No call back requested

## 2017-06-12 ENCOUNTER — Ambulatory Visit (HOSPITAL_COMMUNITY)
Admission: RE | Admit: 2017-06-12 | Discharge: 2017-06-12 | Disposition: A | Discharge status: Home / Self Care | Payer: BLUE CROSS/BLUE SHIELD | Source: Ambulatory Visit | Attending: Surgery | Admitting: Surgery

## 2017-06-12 ENCOUNTER — Encounter (HOSPITAL_COMMUNITY): Payer: Self-pay | Admitting: Radiology

## 2017-06-12 DIAGNOSIS — Z01818 Encounter for other preprocedural examination: Secondary | ICD-10-CM | POA: Insufficient documentation

## 2017-06-12 DIAGNOSIS — Z01810 Encounter for preprocedural cardiovascular examination: Secondary | ICD-10-CM | POA: Insufficient documentation

## 2017-06-20 ENCOUNTER — Other Ambulatory Visit: Payer: Self-pay | Admitting: Sports Medicine

## 2017-06-20 DIAGNOSIS — I10 Essential (primary) hypertension: Secondary | ICD-10-CM

## 2017-07-09 ENCOUNTER — Encounter: Payer: Self-pay | Admitting: Skilled Nursing Facility1

## 2017-07-09 ENCOUNTER — Encounter: Payer: BLUE CROSS/BLUE SHIELD | Attending: Surgery | Admitting: Skilled Nursing Facility1

## 2017-07-09 DIAGNOSIS — E119 Type 2 diabetes mellitus without complications: Secondary | ICD-10-CM

## 2017-07-09 DIAGNOSIS — Z713 Dietary counseling and surveillance: Secondary | ICD-10-CM | POA: Diagnosis not present

## 2017-07-09 NOTE — Progress Notes (Signed)
Sleeve Gastrectomy  Assessment:   1 SWL Appointment.   Pt needs 3 months SWL.  Pts wife is extremely talkative.  Pt states he had a time of increased thirst and urination with a blood sugar of 144. Fasting numbers: 120 and 2 hours post prandial: 140-160. Pt states she is now eating breakfast. Pt states her fibromyalgia keeps her up trying to work on her sleep schedule. Pt states she has not had soda as often. Pt states she drinks soda when she is out to eat. Pt states she has a new bottle for water and has gotten half to one whole one in. Pt became tearful when she realized she could still eat foods she loves after surgery and states she would have gotten the surgery sooner if she had known that.   Start weight at [facility]: 348.6 Weight:355 BMI: 54.11  MEDICATIONS: See List   24 hr Dietary Recall: First Meal: cereal or oatmeal or bagel Snack: chips Second Meal: sandwich or frozen pizza or snadwich and chips or tuna salad sandwich or canned ravioli and applesauce  Snack: candy Third Meal: salsbury steak mashed potatoes and corn or spaghetti or chicken casserole and green beans Snack: ice cream  Beverages: dr pepper, sweet tea, apple juice, milk, water, sprie  Encouraged to engage in 150 minutes of moderate physical activity including cardiovascular and weight baring weekly  Diet to Follow: 1500 calories 170 g carbohydrates 112 g protein 42 g fat   Nutritional Diagnosis:  -3.3 Overweight/obesity related to past poor dietary habits and physical inactivity as evidenced by patient w/ planned Sleeve surgery following dietary guidelines for continued weight loss.    Intervention:  Nutrition counseling for upcoming Bariatric Surgery. Goals: -Encouraged to engage in 150 minutes of moderate physical activity including cardiovascular and weight baring weekly -Check your blood sugar when you are feeling really thirsty and having to pee a  lot PackageNews[employer]de [facility] [phone]) Keep working on not drinking with meals Continue to work on cutting out soda Try unsweet tea with Splenda  Continue to work on chewing well Try to eat every 3-5 hours: balanced meals and portions using MyPlate   Teaching Method Utilized:  Visual Auditory Hands on  Handouts given during visit include:  Living well with diabetes  Barriers to learning/adherence to lifestyle change: none identified   Demonstrated degree of understanding via:  Teach Back   Monitoring/Evaluation:  Dietary intake, exercise, and body weight prn.

## 2017-08-07 ENCOUNTER — Ambulatory Visit: Payer: BLUE CROSS/BLUE SHIELD | Admitting: Skilled Nursing Facility1

## 2017-08-12 ENCOUNTER — Encounter: Payer: BLUE CROSS/BLUE SHIELD | Attending: Surgery | Admitting: Skilled Nursing Facility1

## 2017-08-12 ENCOUNTER — Encounter: Payer: Self-pay | Admitting: Skilled Nursing Facility1

## 2017-08-12 DIAGNOSIS — E119 Type 2 diabetes mellitus without complications: Secondary | ICD-10-CM

## 2017-08-12 DIAGNOSIS — Z713 Dietary counseling and surveillance: Secondary | ICD-10-CM | POA: Diagnosis not present

## 2017-08-12 NOTE — Patient Instructions (Addendum)
-  Try adding sugar free flavorings and lemon in your water  -"I can have it latter"  -Walk a minimum of 2 days every week for 30 minutes by July 6th

## 2017-08-12 NOTE — Progress Notes (Signed)
Sleeve Gastrectomy  Assessment:   3 SWL Appointment.   Pt needs 3 months SWL.  Pts wife is extremely talkative.  Pt states she is still struggling with getting in more water and cutting out sugary drinks. Pt states she has tried splenda in tea which made her thirstier. Pt states she will still struggle with portions and eating often throughout the day. Pt state she now eats breakfast when before she did not. Pt states she does need to be more physically active. Pt states she has been taking her medicine as prescribed.   Start weight at [facility]: 348.6 Weight:352.4 BMI: 53.57  MEDICATIONS: See List   24 hr Dietary Recall: First Meal: cereal or oatmeal or bagel Snack: chips Second Meal: sandwich or frozen pizza or snadwich and chips or tuna salad sandwich or canned ravioli and applesauce  Snack: candy Third Meal: salsbury steak mashed potatoes and corn or spaghetti or chicken casserole and green beans Snack: ice cream  Beverages: dr pepper, sweet tea, apple juice, milk, water, sprie  Encouraged to engage in 150 minutes of moderate physical activity including cardiovascular and weight baring weekly  Diet to Follow: 1500 calories 170 g carbohydrates 112 g protein 42 g fat   Nutritional Diagnosis:  [place]-3.3 Overweight/obesity related to past poor dietary habits and physical inactivity as evidenced by patient w/ planned Sleeve surgery following dietary guidelines for continued weight loss.    Intervention:  Nutrition counseling for upcoming Bariatric Surgery. Goals: -Encouraged to engage in 150 minutes of moderate physical activity including cardiovascular and weight baring weekly -Try adding sugar free flavorings and lemon in your water -"I can have it latter" -Walk a minimum of 2 days every week for 30 minutes by July   Teaching Method Utilized:  Visual Auditory Hands on  Handouts given during visit include:  Living well with diabetes  Barriers to learning/adherence to  lifestyle change: none identified   Demonstrated degree of understanding via:  Teach Back   Monitoring/Evaluation:  Dietary intake, exercise, and body weight prn.

## 2017-09-11 ENCOUNTER — Ambulatory Visit: Payer: Self-pay | Admitting: Neurology

## 2017-09-20 ENCOUNTER — Emergency Department
Admission: EM | Admit: 2017-09-20 | Discharge: 2017-09-20 | Disposition: A | Discharge status: Home / Self Care | Payer: BLUE CROSS/BLUE SHIELD | Source: Home / Self Care | Attending: Family Medicine | Admitting: Family Medicine

## 2017-09-20 ENCOUNTER — Encounter: Payer: Self-pay | Admitting: Emergency Medicine

## 2017-09-20 DIAGNOSIS — M25531 Pain in right wrist: Secondary | ICD-10-CM | POA: Diagnosis not present

## 2017-09-20 DIAGNOSIS — M654 Radial styloid tenosynovitis [de Quervain]: Secondary | ICD-10-CM

## 2017-09-20 MED ORDER — MELOXICAM 15 MG PO TABS: 15.0000 mg | ORAL_TABLET | Freq: Every day | ORAL | 0 refills | Status: DC | PRN

## 2017-09-20 NOTE — ED Triage Notes (Signed)
Pt c/o right hand pain x 1 week, no injury.

## 2017-09-20 NOTE — Discharge Instructions (Addendum)
°  Your pain appears to be due to an over use injury, resulting in inflammation of a tendon in your hand/wrist.  It is recommended you wear your thumb/wrist splint at all times, even at night but it can be removed to bath or to apply a cool compress 2-3 times a day.  Meloxicam (Mobic) is an antiinflammatory to help with pain and inflammation.  Do not take ibuprofen, Advil, Aleve, or any other medications that contain NSAIDs while taking meloxicam as this may cause stomach upset or even ulcers if taken in large amounts for an extended period of time.   Please call to schedule an appointment with Sports Medicine next week if not improving as they likely have additional treatment options, however, it could take 1-2 weeks or more to fully resolve.

## 2017-09-20 NOTE — ED Provider Notes (Signed)
[facility]    CSN: [id number] Arrival date & time: 09/20/17  1620     History   Chief Complaint Chief Complaint  Patient presents with  . Hand Pain    HPI Madigan Rosensteel is a 32 y.o. female.   HPI Haliey Romberg is a 32 y.o. female presenting to UC with c/o intermittent Right wrist/thumb pain for about 1 week. It was getting better but then started to hurt again the last 2-3 days and has been constant. Aching and throbbing, moderate severity, worse with grasping or turning her hand such as opening a door handle.  She took ibuprofen, tylenol, used ice, flexeril, and even took her dad's tramadol but only mild relief. Rest makes pain improve significantly. No known injury. She is Right hand dominant.    Past Medical History:  Diagnosis Date  . Depression   . Endometrial polyp   . History of recurrent UTIs   . Hypertension   . Infertility, anovulation   . Myofascial muscle pain   . OA (osteoarthritis) of knee    BILATERAL  . PCOS (polycystic ovarian syndrome)   . PONV (postoperative nausea and vomiting)   . Seasonal allergic rhinitis     Patient Active Problem List   Diagnosis Date Noted  . Diet-controlled diabetes mellitus (HCC) 04/25/2017  . Subacromial bursitis of left shoulder joint 04/25/2016  . Hand, foot and mouth disease 01/10/2016  . Low back pain 08/24/2015  . Annual physical exam 03/23/2015  . Intractable migraine without aura and with status migrainosus 06/24/2014  . Myofascial pain syndrome with depression 02/08/2014  . Venous stasis dermatitis 01/25/2014  . Right hip pain 12/13/2013  . Metatarsalgia of both feet 10/18/2013  . Chondromalacia of patellofemoral joint 02/13/2012  . Essential hypertension, benign 11/25/2008  . POLYCYSTIC OVARIAN DISEASE 10/26/2008  . OBESITY, CLASS III 10/26/2008    Past Surgical History:  Procedure Laterality Date  . HYSTEROSCOPY W/D&C N/A 09/07/2014   Procedure:  DILATATION AND CURETTAGE  /HYSTEROSCOPY/POLYPECTOMY;  Surgeon: Fermin Schwab, MD;  Location: [facility];  Service: Gynecology;  Laterality: N/A;  . I & D RIGHT FOOT  1998  . POLYPECTOMY     uterine  . TONSILLECTOMY  2001  . WISDOM TOOTH EXTRACTION  2004    OB History    Gravida  0   Para  0   Term  0   Preterm  0   AB  0   Living  0     SAB  0   TAB  0   Ectopic  0   Multiple  0   Live Births               Home Medications    Prior to Admission medications   Medication Sig Start Date End Date Taking? Authorizing Provider  amLODipine (NORVASC) 10 MG tablet TAKE 1 TABLET BY MOUTH EVERY DAY 06/20/17   Monica Becton, MD  DULoxetine (CYMBALTA) 60 MG capsule Take 2 capsules (120 mg total) by mouth daily. 04/10/17   Monica Becton, MD  meloxicam (MOBIC) 15 MG tablet Take 1 tablet (15 mg total) by mouth daily as needed for pain. 09/20/17   Lurene Shadow, PA-C  metoprolol tartrate (LOPRESSOR) 50 MG tablet Take 1 tablet (50 mg total) by mouth 2 (two) times daily. 02/20/17   Monica Becton, MD  rizatriptan (MAXALT) 10 MG tablet Take 1 tablet (10 mg total) by mouth as needed for migraine. May repeat in 2  hours if needed 09/26/14   Monica Becton, MD  triamcinolone (KENALOG) 0.1 % paste Use as directed 1 application in the mouth or throat 2 (two) times daily. 01/02/16   Rodolph Bong, MD  valACYclovir (VALTREX) 1000 MG tablet Take 1 tablet (1,000 mg total) by mouth 2 (two) times daily. 01/02/16   Rodolph Bong, MD  valsartan-hydrochlorothiazide (DIOVAN-HCT) 320-25 MG tablet Take 1 tablet by mouth daily. 04/10/17   Monica Becton, MD    Family History Family History  Problem Relation Age of Onset  . Diabetes Mother   . Polycystic ovary syndrome Mother   . Hypertension Father   . Diabetes Father   . Fibromyalgia Maternal Aunt   . Fibromyalgia Cousin   . Cancer Paternal Grandfather        prostate  . Glaucoma Paternal Grandmother     Social  History Social History   Tobacco Use  . Smoking status: Never Smoker  . Smokeless tobacco: Never Used  Substance Use Topics  . Alcohol use: No    Alcohol/week: 0.0 oz  . Drug use: No     Allergies   Amoxicillin; Ceclor [cefaclor]; Enalapril maleate; Erythromycin; Penicillins; and Sulfa antibiotics   Review of Systems Review of Systems  Musculoskeletal: Positive for arthralgias and myalgias. Negative for joint swelling.  Skin: Negative for color change, rash and wound.  Neurological: Positive for weakness. Negative for numbness.     Physical Exam Triage Vital Signs ED Triage Vitals  Enc Vitals Group     BP 09/20/17 1653 132/88     Pulse Rate 09/20/17 1653 86     Resp --      Temp 09/20/17 1653 97.8 F (36.6 C)     Temp Source 09/20/17 1653 Oral     SpO2 09/20/17 1653 98 %     Weight 09/20/17 1654 (!) 351 lb (159.2 kg)     Height 09/20/17 1654 5\' 9"  (1.753 m)     Head Circumference --      Peak Flow --      Pain Score 09/20/17 1654 10     Pain Loc --      Pain Edu? --      Excl. in GC? --    No data found.  Updated Vital Signs BP 132/88 (BP Location: Right Arm)   Pulse 86   Temp 97.8 F (36.6 C) (Oral)   Ht 5\' 9"  (1.753 m)   Wt (!) 351 lb (159.2 kg)   LMP 09/18/2017 (Exact Date)   SpO2 98%   BMI 51.83 kg/m   Visual Acuity Right Eye Distance:   Left Eye Distance:   Bilateral Distance:    Right Eye Near:   Left Eye Near:    Bilateral Near:     Physical Exam  Constitutional: She is oriented to person, place, and time. She appears well-developed and well-nourished.  HENT:  Head: Normocephalic and atraumatic.  Eyes: EOM are normal.  Neck: Normal range of motion.  Cardiovascular: Normal rate.  Pulses:      Radial pulses are 2+ on the right side.  Pulmonary/Chest: Effort normal.  Musculoskeletal: Normal range of motion. She exhibits tenderness. She exhibits no edema.  Right wrist and hand: no obvious edema. Tenderness over radial lateral aspect of  wrist and 1st metacarpal. Positive Finkelstein test.  Neurological: She is alert and oriented to person, place, and time.  Skin: Skin is warm and dry. Capillary refill takes less than 2 seconds.  Right hand  and wrist: skin in tact. No ecchymosis, erythema or warmth.   Psychiatric: She has a normal mood and affect. Her behavior is normal.  Nursing note and vitals reviewed.    UC Treatments / Results  Labs (all labs ordered are listed, but only abnormal results are displayed) Labs Reviewed - No data to display  EKG None  Radiology No results found.  Procedures Procedures (including critical care time)  Medications Ordered in UC Medications - No data to display  Initial Impression / Assessment and Plan / UC Course  I have reviewed the triage vital signs and the nursing notes.  Pertinent labs & imaging results that were available during my care of the patient were reviewed by me and considered in my medical decision making (see chart for details).     Hx and exam overuse injury. No indication for imaging at this time. Will try conservative treatment.   Final Clinical Impressions(s) / UC Diagnoses   Final diagnoses:  De Quervain's tenosynovitis, right     Discharge Instructions      Your pain appears to be due to an over use injury, resulting in inflammation of a tendon in your hand/wrist.  It is recommended you wear your thumb/wrist splint at all times, even at night but it can be removed to bath or to apply a cool compress 2-3 times a day.  Meloxicam (Mobic) is an antiinflammatory to help with pain and inflammation.  Do not take ibuprofen, Advil, Aleve, or any other medications that contain NSAIDs while taking meloxicam as this may cause stomach upset or even ulcers if taken in large amounts for an extended period of time.   Please call to schedule an appointment with Sports Medicine next week if not improving as they likely have additional treatment options, however,  it could take 1-2 weeks or more to fully resolve.     ED Prescriptions    Medication Sig Dispense Auth. Provider   meloxicam (MOBIC) 15 MG tablet Take 1 tablet (15 mg total) by mouth daily as needed for pain. 20 tablet Lurene ShadowPhelps, Reah Justo O, PA-C     Controlled Substance Prescriptions [place] Controlled Substance Registry consulted? Not Applicable   Rolla Platehelps, Jailynn Lavalais O, PA-C 09/20/17 1722

## 2017-09-25 ENCOUNTER — Ambulatory Visit: Payer: Self-pay | Admitting: Surgery

## 2017-09-25 NOTE — H&P (Signed)
Surgical H&P  CC: morbid obesity  HPI: she presents for her preoperative visit.  She has completed the preoperative workup with no barriers identified.  Doesn't really have any questions today.  Her only concern is that she has a sprain tended in her right wrist and was given a prescription for Mobic, she has taken 1 dose only.  She will go for her preoperative education dietitian class on the fifth.  CXR neg UGI neg, no hiatal hernia EKG NSR Labs unremarkable Dietician- approved Psych- approved Sleep study showed OSA  Initial visit 04/10/17: This is a very pleasant 32 year old woman who is here with her wife today to discuss bariatric surgery. She has been thinking about for quite a while and has been shortened with obesity for most of her life. She was actually advised to consider bariatric surgery at the age of 22 but at that time she was not ready mentally for that change. She has a history of polycystic ovarian syndrome, infertility and anovulation. She tried to do in vitro fertilization a few years ago and that failed which prompted more weight gain. She also has a history of myofascial muscle pain syndrome or fibromyalgia which has flared in the recent past and again has contributed to weight gain. She is a history of depression. No prior abdominal surgeries. She has tried numerous weight loss attempts including phentermine, nutritionist counseling, mindful eating and numerous diets.  The patient is a 32 year old female who presents for a bariatric surgery evaluation. Associated symptoms include depressed mood, joint pains and infertility. Initial onset of obesity was in childhood. Initial presentation included depression and infertility. Disease complications include hypertension, while disease complications do not include diabetes. Current diet includes well balanced meals. less than once per week. Limitations to weight loss are sedentary lifestyle. Pertinent family history includes  obesity and diabetes. The patient is currently able to do activities of daily living without limitations and able to work with limitations. Past evaluation has included dietician consultation. Past treatment has included low calorie diet, appetite suppressants, diet counselling and behavior modification. Psychiatric History: The patient has depression. Cardiac history: The patient denies smoking. Gastrointestinal History: Patient denies heartburn or dysphagia.  Allergies  Allergen Reactions  . Amoxicillin Other (See Comments)    Caused yeast infection  . Ceclor [Cefaclor] Other (See Comments)    Caused yeast infection  . Enalapril Maleate Swelling    REACTION: lip swelling  . Erythromycin Diarrhea  . Penicillins Other (See Comments)    Caused yeast infection  . Sulfa Antibiotics     Past Medical History:  Diagnosis Date  . Depression   . Endometrial polyp   . History of recurrent UTIs   . Hypertension   . Infertility, anovulation   . Myofascial muscle pain   . OA (osteoarthritis) of knee    BILATERAL  . PCOS (polycystic ovarian syndrome)   . PONV (postoperative nausea and vomiting)   . Seasonal allergic rhinitis     Past Surgical History:  Procedure Laterality Date  . HYSTEROSCOPY W/D&C N/A 09/07/2014   Procedure:  DILATATION AND CURETTAGE /HYSTEROSCOPY/POLYPECTOMY;  Surgeon: Fermin Schwab, MD;  Location: [facility];  Service: Gynecology;  Laterality: N/A;  . I & D RIGHT FOOT  1998  . POLYPECTOMY     uterine  . TONSILLECTOMY  2001  . WISDOM TOOTH EXTRACTION  2004    Family History  Problem Relation Age of Onset  . Diabetes Mother   . Polycystic ovary syndrome Mother   .  Hypertension Father   . Diabetes Father   . Fibromyalgia Maternal Aunt   . Fibromyalgia Cousin   . Cancer Paternal Grandfather        prostate  . Glaucoma Paternal Grandmother     Social History   Socioeconomic History  . Marital status: Married    Spouse name: Not on file   . Number of children: Not on file  . Years of education: Not on file  . Highest education level: Not on file  Occupational History  . Occupation: Programmer, systemsdistributer  Social Needs  . Financial resource strain: Not on file  . Food insecurity:    Worry: Not on file    Inability: Not on file  . Transportation needs:    Medical: Not on file    Non-medical: Not on file  Tobacco Use  . Smoking status: Never Smoker  . Smokeless tobacco: Never Used  Substance and Sexual Activity  . Alcohol use: No    Alcohol/week: 0.0 oz  . Drug use: No  . Sexual activity: Not Currently    Partners: Female  Lifestyle  . Physical activity:    Days per week: Not on file    Minutes per session: Not on file  . Stress: Not on file  Relationships  . Social connections:    Talks on phone: Not on file    Gets together: Not on file    Attends religious service: Not on file    Active member of club or organization: Not on file    Attends meetings of clubs or organizations: Not on file    Relationship status: Not on file  Other Topics Concern  . Not on file  Social History Narrative  . Not on file    Current Outpatient Medications on File Prior to Visit  Medication Sig Dispense Refill  . amLODipine (NORVASC) 10 MG tablet TAKE 1 TABLET BY MOUTH EVERY DAY 90 tablet 0  . DULoxetine (CYMBALTA) 60 MG capsule Take 2 capsules (120 mg total) by mouth daily. 180 capsule 1  . meloxicam (MOBIC) 15 MG tablet Take 1 tablet (15 mg total) by mouth daily as needed for pain. 20 tablet 0  . metoprolol tartrate (LOPRESSOR) 50 MG tablet Take 1 tablet (50 mg total) by mouth 2 (two) times daily. 180 tablet 1  . rizatriptan (MAXALT) 10 MG tablet Take 1 tablet (10 mg total) by mouth as needed for migraine. May repeat in 2 hours if needed 90 tablet 0  . triamcinolone (KENALOG) 0.1 % paste Use as directed 1 application in the mouth or throat 2 (two) times daily. 5 g 11  . valACYclovir (VALTREX) 1000 MG tablet Take 1 tablet (1,000 mg  total) by mouth 2 (two) times daily. 20 tablet 0  . valsartan-hydrochlorothiazide (DIOVAN-HCT) 320-25 MG tablet Take 1 tablet by mouth daily.     No current facility-administered medications on file prior to visit.     Review of Systems: a complete, 10pt review of systems was completed with pertinent positives and negatives as documented in the HPI  Physical Exam: HR 91 BP 138/82 Sat 98% room air 351.25lb, BMI 54.2   Gen: alert and well appearing Eye: extraocular motion intact, no scleral icterus ENT: moist mucus membranes, dentition intact Neck: no mass or thyromegaly Chest: unlabored respirations, symmetrical air entry, clear bilaterally CV: regular rate and rhythm, no pedal edema Abdomen: soft, nontender, nondistended. No mass or organomegaly MSK: strength symmetrical throughout, no deformity Neuro: grossly intact, normal gait Psych: normal mood  and affect, appropriate insight Skin: warm and dry, no rash or lesion on limited exam    CBC Latest Ref Rng & Units 04/24/2017 03/23/2015 09/07/2014  WBC 3.8 - 10.8 Thousand/uL 9.2 11.3(H) -  Hemoglobin 11.7 - 15.5 g/dL 16.1 11.8(L) 12.2  Hematocrit 35.0 - 45.0 % 38.9 37.0 36.0  Platelets 140 - 400 Thousand/uL 238 271 -    CMP Latest Ref Rng & Units 04/24/2017 03/23/2015 09/07/2014  Glucose 65 - 99 mg/dL 096(E) 83 94  BUN 7 - 25 mg/dL 8 10 7   Creatinine 0.50 - 1.10 mg/dL 4.54 0.98 1.19  Sodium 135 - 146 mmol/L 139 141 139  Potassium 3.5 - 5.3 mmol/L 3.9 4.0 3.4(L)  Chloride 98 - 110 mmol/L 102 104 101  CO2 20 - 32 mmol/L 31 28 -  Calcium 8.6 - 10.2 mg/dL 8.7 1.4(N) -  Total Protein 6.1 - 8.1 g/dL 6.3 6.5 -  Total Bilirubin 0.2 - 1.2 mg/dL 0.4 0.3 -  Alkaline Phos 33 - 115 U/L - 79 -  AST 10 - 30 U/L 20 20 -  ALT 6 - 29 U/L 26 24 -    Lab Results  Component Value Date   INR 0.98 01/25/2014    Imaging: No results found.   A/P: MORBID OBESITY (E66.01) Story: For sleeve gastrectomy. She remains a good candidate. Her main  reason for pursuing weight loss surgery is that she wants to lose enough weight that her fertility improved so that she can get pregnant. We have previously discussed the technique of the operation, the risks of bleeding, infection, pain, scarring, intra-abdominal injury, staple line leak or abscess, chronic abdominal pain nausea or postoperative reflux, incisional hernia, weight regain, constipation, etc. We discussed the typical preoperative pathway, the typical postoperative course and expected changes. We discussed the importance of the nutritionist and psychologist and I strongly emphasized that without significant changes in behaviors and thought patterns weight regain is a strong possibility. They expressed understanding. Surgery is scheduled for August 26.  Current Plans Started OxyCODONE HCl 5 MG/5ML Oral Solution, 5-10 Milliliter every four hours, as needed, 100 Milliliter, 09/25/2017, No Refill. Started Protonix 40 MG Oral Tablet Delayed Release, 1 (one) Tablet daily, #30, 30 days starting 09/25/2017, Ref. x3. Started Ondansetron 4 MG Oral Tablet Disintegrating, 1 (one) Tablet every eight hours, as needed, #15, 09/25/2017, No Refill.   Phylliss Blakes, MD [facility], [place] Pager [phone]

## 2017-09-25 NOTE — H&P (View-Only) (Signed)
Surgical H&P  CC: morbid obesity  HPI: she presents for her preoperative visit.  She has completed the preoperative workup with no barriers identified.  Doesn't really have any questions today.  Her only concern is that she has a sprain tended in her right wrist and was given a prescription for Mobic, she has taken 1 dose only.  She will go for her preoperative education dietitian class on the fifth.  CXR neg UGI neg, no hiatal hernia EKG NSR Labs unremarkable Dietician- approved Psych- approved Sleep study showed OSA  Initial visit 04/10/17: This is a very pleasant 32 year old woman who is here with her wife today to discuss bariatric surgery. She has been thinking about for quite a while and has been shortened with obesity for most of her life. She was actually advised to consider bariatric surgery at the age of 5 but at that time she was not ready mentally for that change. She has a history of polycystic ovarian syndrome, infertility and anovulation. She tried to do in vitro fertilization a few years ago and that failed which prompted more weight gain. She also has a history of myofascial muscle pain syndrome or fibromyalgia which has flared in the recent past and again has contributed to weight gain. She is a history of depression. No prior abdominal surgeries. She has tried numerous weight loss attempts including phentermine, nutritionist counseling, mindful eating and numerous diets.  The patient is a 31 year old female who presents for a bariatric surgery evaluation. Associated symptoms include depressed mood, joint pains and infertility. Initial onset of obesity was in childhood. Initial presentation included depression and infertility. Disease complications include hypertension, while disease complications do not include diabetes. Current diet includes well balanced meals. less than once per week. Limitations to weight loss are sedentary lifestyle. Pertinent family history includes  obesity and diabetes. The patient is currently able to do activities of daily living without limitations and able to work with limitations. Past evaluation has included dietician consultation. Past treatment has included low calorie diet, appetite suppressants, diet counselling and behavior modification. Psychiatric History: The patient has depression. Cardiac history: The patient denies smoking. Gastrointestinal History: Patient denies heartburn or dysphagia.  Allergies  Allergen Reactions  . Amoxicillin Other (See Comments)    Caused yeast infection  . Ceclor [Cefaclor] Other (See Comments)    Caused yeast infection  . Enalapril Maleate Swelling    REACTION: lip swelling  . Erythromycin Diarrhea  . Penicillins Other (See Comments)    Caused yeast infection  . Sulfa Antibiotics     Past Medical History:  Diagnosis Date  . Depression   . Endometrial polyp   . History of recurrent UTIs   . Hypertension   . Infertility, anovulation   . Myofascial muscle pain   . OA (osteoarthritis) of knee    BILATERAL  . PCOS (polycystic ovarian syndrome)   . PONV (postoperative nausea and vomiting)   . Seasonal allergic rhinitis     Past Surgical History:  Procedure Laterality Date  . HYSTEROSCOPY W/D&C N/A 09/07/2014   Procedure:  DILATATION AND CURETTAGE /HYSTEROSCOPY/POLYPECTOMY;  Surgeon: Fermin Schwab, MD;  Location: [facility];  Service: Gynecology;  Laterality: N/A;  . I & D RIGHT FOOT  1998  . POLYPECTOMY     uterine  . TONSILLECTOMY  2001  . WISDOM TOOTH EXTRACTION  2004    Family History  Problem Relation Age of Onset  . Diabetes Mother   . Polycystic ovary syndrome Mother   .  Hypertension Father   . Diabetes Father   . Fibromyalgia Maternal Aunt   . Fibromyalgia Cousin   . Cancer Paternal Grandfather        prostate  . Glaucoma Paternal Grandmother     Social History   Socioeconomic History  . Marital status: Married    Spouse name: Not on file   . Number of children: Not on file  . Years of education: Not on file  . Highest education level: Not on file  Occupational History  . Occupation: Programmer, systemsdistributer  Social Needs  . Financial resource strain: Not on file  . Food insecurity:    Worry: Not on file    Inability: Not on file  . Transportation needs:    Medical: Not on file    Non-medical: Not on file  Tobacco Use  . Smoking status: Never Smoker  . Smokeless tobacco: Never Used  Substance and Sexual Activity  . Alcohol use: No    Alcohol/week: 0.0 oz  . Drug use: No  . Sexual activity: Not Currently    Partners: Female  Lifestyle  . Physical activity:    Days per week: Not on file    Minutes per session: Not on file  . Stress: Not on file  Relationships  . Social connections:    Talks on phone: Not on file    Gets together: Not on file    Attends religious service: Not on file    Active member of club or organization: Not on file    Attends meetings of clubs or organizations: Not on file    Relationship status: Not on file  Other Topics Concern  . Not on file  Social History Narrative  . Not on file    Current Outpatient Medications on File Prior to Visit  Medication Sig Dispense Refill  . amLODipine (NORVASC) 10 MG tablet TAKE 1 TABLET BY MOUTH EVERY DAY 90 tablet 0  . DULoxetine (CYMBALTA) 60 MG capsule Take 2 capsules (120 mg total) by mouth daily. 180 capsule 1  . meloxicam (MOBIC) 15 MG tablet Take 1 tablet (15 mg total) by mouth daily as needed for pain. 20 tablet 0  . metoprolol tartrate (LOPRESSOR) 50 MG tablet Take 1 tablet (50 mg total) by mouth 2 (two) times daily. 180 tablet 1  . rizatriptan (MAXALT) 10 MG tablet Take 1 tablet (10 mg total) by mouth as needed for migraine. May repeat in 2 hours if needed 90 tablet 0  . triamcinolone (KENALOG) 0.1 % paste Use as directed 1 application in the mouth or throat 2 (two) times daily. 5 g 11  . valACYclovir (VALTREX) 1000 MG tablet Take 1 tablet (1,000 mg  total) by mouth 2 (two) times daily. 20 tablet 0  . valsartan-hydrochlorothiazide (DIOVAN-HCT) 320-25 MG tablet Take 1 tablet by mouth daily.     No current facility-administered medications on file prior to visit.     Review of Systems: a complete, 10pt review of systems was completed with pertinent positives and negatives as documented in the HPI  Physical Exam: HR 91 BP 138/82 Sat 98% room air 351.25lb, BMI 54.2   Gen: alert and well appearing Eye: extraocular motion intact, no scleral icterus ENT: moist mucus membranes, dentition intact Neck: no mass or thyromegaly Chest: unlabored respirations, symmetrical air entry, clear bilaterally CV: regular rate and rhythm, no pedal edema Abdomen: soft, nontender, nondistended. No mass or organomegaly MSK: strength symmetrical throughout, no deformity Neuro: grossly intact, normal gait Psych: normal mood  and affect, appropriate insight Skin: warm and dry, no rash or lesion on limited exam    CBC Latest Ref Rng & Units 04/24/2017 03/23/2015 09/07/2014  WBC 3.8 - 10.8 Thousand/uL 9.2 11.3(H) -  Hemoglobin 11.7 - 15.5 g/dL 60.4 11.8(L) 12.2  Hematocrit 35.0 - 45.0 % 38.9 37.0 36.0  Platelets 140 - 400 Thousand/uL 238 271 -    CMP Latest Ref Rng & Units 04/24/2017 03/23/2015 09/07/2014  Glucose 65 - 99 mg/dL 540(J) 83 94  BUN 7 - 25 mg/dL 8 10 7   Creatinine 0.50 - 1.10 mg/dL 8.11 9.14 7.82  Sodium 135 - 146 mmol/L 139 141 139  Potassium 3.5 - 5.3 mmol/L 3.9 4.0 3.4(L)  Chloride 98 - 110 mmol/L 102 104 101  CO2 20 - 32 mmol/L 31 28 -  Calcium 8.6 - 10.2 mg/dL 8.7 9.5(A) -  Total Protein 6.1 - 8.1 g/dL 6.3 6.5 -  Total Bilirubin 0.2 - 1.2 mg/dL 0.4 0.3 -  Alkaline Phos 33 - 115 U/L - 79 -  AST 10 - 30 U/L 20 20 -  ALT 6 - 29 U/L 26 24 -    Lab Results  Component Value Date   INR 0.98 01/25/2014    Imaging: No results found.   A/P: MORBID OBESITY (E66.01) Story: For sleeve gastrectomy. She remains a good candidate. Her main  reason for pursuing weight loss surgery is that she wants to lose enough weight that her fertility improved so that she can get pregnant. We have previously discussed the technique of the operation, the risks of bleeding, infection, pain, scarring, intra-abdominal injury, staple line leak or abscess, chronic abdominal pain nausea or postoperative reflux, incisional hernia, weight regain, constipation, etc. We discussed the typical preoperative pathway, the typical postoperative course and expected changes. We discussed the importance of the nutritionist and psychologist and I strongly emphasized that without significant changes in behaviors and thought patterns weight regain is a strong possibility. They expressed understanding. Surgery is scheduled for August 26.  Current Plans Started OxyCODONE HCl 5 MG/5ML Oral Solution, 5-10 Milliliter every four hours, as needed, 100 Milliliter, 09/25/2017, No Refill. Started Protonix 40 MG Oral Tablet Delayed Release, 1 (one) Tablet daily, #30, 30 days starting 09/25/2017, Ref. x3. Started Ondansetron 4 MG Oral Tablet Disintegrating, 1 (one) Tablet every eight hours, as needed, #15, 09/25/2017, No Refill.   Phylliss Blakes, MD [facility], [place] Pager [phone]

## 2017-09-29 ENCOUNTER — Encounter: Payer: BLUE CROSS/BLUE SHIELD | Attending: Surgery | Admitting: Skilled Nursing Facility1

## 2017-09-29 DIAGNOSIS — Z713 Dietary counseling and surveillance: Secondary | ICD-10-CM | POA: Insufficient documentation

## 2017-09-29 DIAGNOSIS — E119 Type 2 diabetes mellitus without complications: Secondary | ICD-10-CM

## 2017-09-30 ENCOUNTER — Encounter: Payer: Self-pay | Admitting: Sports Medicine

## 2017-09-30 NOTE — Progress Notes (Signed)
Pre-Operative Nutrition Class:  Appt start time: 1730   End time:  1830.  Patient was seen on 09/29/2017 for Pre-Operative Bariatric Surgery Education at [facility].   Surgery date:  Surgery type: sleeve Start weight at [facility]: 348.6 Weight today: 348.4  Samples given per MNT protocol. Patient educated on appropriate usage:  Bariatric Advantage Calcium  Lot # [id number] Exp: may/02/2018  Procare Multi: Lot: [id number] Exp: 11/2018  Unjury Protein Shake Lot #[id number] Exp: May 02, 2018  The following the learning objectives were met by the patient during this course:  Identify Pre-Op Dietary Goals and will begin 2 weeks pre-operatively  Identify appropriate sources of fluids and proteins   State protein recommendations and appropriate sources pre and post-operatively  Identify Post-Operative Dietary Goals and will follow for 2 weeks post-operatively  Identify appropriate multivitamin and calcium sources  Describe the need for physical activity post-operatively and will follow MD recommendations  State when to call healthcare provider regarding medication questions or post-operative complications  Handouts given during class include:  Pre-Op Bariatric Surgery Diet Handout  Protein Shake Handout  Post-Op Bariatric Surgery Nutrition Handout  BELT Program Information Flyer  Support Group Information Flyer  [facility] Bariatric Supplements Price List  Follow-Up Plan: Patient will follow-up at [facility] 2 weeks post operatively for diet advancement per MD.   

## 2017-10-13 NOTE — Progress Notes (Signed)
06-12-17 (Epic) EKG, CXR

## 2017-10-13 NOTE — Patient Instructions (Addendum)
Chelsea [name]  10/13/2017   Your procedure is scheduled on: 10-20-17     Report to [facility] Main  Entrance    Report to Admitting at 5:30 AM    Call this number if you have problems the morning of surgery [phone]     Remember: MORNING OF SURGERY DRINK:  1SHAKE BEFORE YOU LEAVE HOME, DRINK ALL OF THE SHAKE AT ONE TIME.    NO SOLID FOOD AFTER 600 PM THE NIGHT BEFORE YOUR SURGERY. YOU MAY DRINK CLEAR FLUIDS. THE SHAKE YOU DRINK BEFORE YOU LEAVE HOME WILL BE THE LAST FLUIDS YOU  DRINK BEFORE SURGERY. PLEASE COMPLETE DRINK BY 4:15 AM   PAIN IS EXPECTED AFTER SURGERY AND WILL NOT BE COMPLETELY ELIMINATED. AMBULATION AND TYLENOL WILL HELP REDUCE INCISIONAL AND GAS PAIN. MOVEMENT IS KEY!   YOU ARE EXPECTED TO BE OUT OF BED WITHIN 4 HOURS OF ADMISSION TO YOUR PATIENT ROOM.   SITTING IN THE RECLINER THROUGHOUT THE DAY IS IMPORTANT FOR DRINKING FLUIDS AND MOVING GAS THROUGHOUT THE GI TRACT.   COMPRESSION STOCKINGS SHOULD BE WORN [facility] STAY UNLESS YOU ARE WALKING.    INCENTIVE SPIROMETER SHOULD BE USED EVERY HOUR WHILE AWAKE TO DECREASE POST-OPERATIVE COMPLICATIONS SUCH AS PNEUMONIA.   WHEN DISCHARGED HOME, IT IS IMPORTANT TO CONTINUE TO WALK EVERY HOUR AND USE THE INCENTIVE SPIROMETER EVERY HOUR.           CLEAR LIQUID DIET   Foods Allowed                                                                     Foods Excluded  Coffee and tea, regular and decaf                             liquids that you cannot  Plain Jell-O in any flavor                                             see through such as: Fruit ices (not with fruit pulp)                                     milk, soups, orange juice  Iced Popsicles                                    All solid food Carbonated beverages, regular and diet                                    Cranberry, grape and apple juices Sports drinks like Gatorade Lightly seasoned clear broth or consume(fat  free) Sugar, honey syrup  Sample Menu Breakfast  Lunch                                     Supper Cranberry juice                    Beef broth                            Chicken broth Jell-O                                     Grape juice                           Apple juice Coffee or tea                        Jell-O                                      Popsicle                                                Coffee or tea                        Coffee or tea  _____________________________________________________________________    Take these medicines the morning of surgery with A SIP OF WATER: Amlodipine (Norvasc), Duloxetine (Cymbalta), Metoprolol Tartrate (Lopressor), and Rizatriptan as needed                                You may not have any metal on your body including hair pins and              piercings  Do not wear jewelry, make-up, lotions, powders or perfumes, deodorant             Do not wear nail polish.  Do not shave  48 hours prior to surgery.     Do not bring valuables to the hospital. [facility] IS NOT             RESPONSIBLE   FOR VALUABLES.  Contacts, dentures or bridgework may not be worn into surgery.  Leave suitcase in the car. After surgery it may be brought to your room.     Special Instructions: N/A              Please read over the following fact sheets you were given: _____________________________________________________________________           [facility] - Preparing for Surgery Before surgery, you can play an important role.  Because skin is not sterile, your skin needs to be as free of germs as possible.  You can reduce the number of germs on your skin by washing with CHG (chlorahexidine gluconate) soap before surgery.  CHG is an antiseptic cleaner which kills germs and bonds with the skin to continue killing germs even after washing. Please DO NOT use if you have  an allergy to CHG or antibacterial soaps.  If your  skin becomes reddened/irritated stop using the CHG and inform your nurse when you arrive at Short Stay. Do not shave (including legs and underarms) for at least 48 hours prior to the first CHG shower.  You may shave your face/neck. Please follow these instructions carefully:  1.  Shower with CHG Soap the night before surgery and the  morning of Surgery.  2.  If you choose to wash your hair, wash your hair first as usual with your  normal  shampoo.  3.  After you shampoo, rinse your hair and body thoroughly to remove the  shampoo.                           4.  Use CHG as you would any other liquid soap.  You can apply chg directly  to the skin and wash                       Gently with a scrungie or clean washcloth.  5.  Apply the CHG Soap to your body ONLY FROM THE NECK DOWN.   Do not use on face/ open                           Wound or open sores. Avoid contact with eyes, ears mouth and genitals (private parts).                       Wash face,  Genitals (private parts) with your normal soap.             6.  Wash thoroughly, paying special attention to the area where your surgery  will be performed.  7.  Thoroughly rinse your body with warm water from the neck down.  8.  DO NOT shower/wash with your normal soap after using and rinsing off  the CHG Soap.                9.  Pat yourself dry with a clean towel.            10.  Wear clean pajamas.            11.  Place clean sheets on your bed the night of your first shower and do not  sleep with pets. Day of Surgery : Do not apply any lotions/deodorants the morning of surgery.  Please wear clean clothes to the hospital/surgery center.  FAILURE TO FOLLOW THESE INSTRUCTIONS MAY RESULT IN THE CANCELLATION OF YOUR SURGERY PATIENT SIGNATURE_________________________________  NURSE SIGNATURE__________________________________  ________________________________________________________________________  WHAT IS A BLOOD TRANSFUSION? Blood Transfusion  Information  A transfusion is the replacement of blood or some of its parts. Blood is made up of multiple cells which provide different functions.  Red blood cells carry oxygen and are used for blood loss replacement.  White blood cells fight against infection.  Platelets control bleeding.  Plasma helps clot blood.  Other blood products are available for specialized needs, such as hemophilia or other clotting disorders. BEFORE THE TRANSFUSION  Who gives blood for transfusions?   Healthy volunteers who are fully evaluated to make sure their blood is safe. This is blood bank blood. Transfusion therapy is the safest it has ever been in the practice of medicine. Before blood is taken from a donor, a complete history is taken to make sure  that person has no history of diseases nor engages in risky social behavior (examples are intravenous drug use or sexual activity with multiple partners). The donor's travel history is screened to minimize risk of transmitting infections, such as malaria. The donated blood is tested for signs of infectious diseases, such as HIV and hepatitis. The blood is then tested to be sure it is compatible with you in order to minimize the chance of a transfusion reaction. If you or a relative donates blood, this is often done in anticipation of surgery and is not appropriate for emergency situations. It takes many days to process the donated blood. RISKS AND COMPLICATIONS Although transfusion therapy is very safe and saves many lives, the main dangers of transfusion include:   Getting an infectious disease.  Developing a transfusion reaction. This is an allergic reaction to something in the blood you were given. Every precaution is taken to prevent this. The decision to have a blood transfusion has been considered carefully by your caregiver before blood is given. Blood is not given unless the benefits outweigh the risks. AFTER THE TRANSFUSION  Right after receiving a  blood transfusion, you will usually feel much better and more energetic. This is especially true if your red blood cells have gotten low (anemic). The transfusion raises the level of the red blood cells which carry oxygen, and this usually causes an energy increase.  The nurse administering the transfusion will monitor you carefully for complications. HOME CARE INSTRUCTIONS  No special instructions are needed after a transfusion. You may find your energy is better. Speak with your caregiver about any limitations on activity for underlying diseases you may have. SEEK MEDICAL CARE IF:   Your condition is not improving after your transfusion.  You develop redness or irritation at the intravenous (IV) site. SEEK IMMEDIATE MEDICAL CARE IF:  Any of the following symptoms occur over the next 12 hours:  Shaking chills.  You have a temperature by mouth above 102 F (38.9 C), not controlled by medicine.  Chest, back, or muscle pain.  People around you feel you are not acting correctly or are confused.  Shortness of breath or difficulty breathing.  Dizziness and fainting.  You get a rash or develop hives.  You have a decrease in urine output.  Your urine turns a dark color or changes to pink, red, or brown. Any of the following symptoms occur over the next 10 days:  You have a temperature by mouth above 102 F (38.9 C), not controlled by medicine.  Shortness of breath.  Weakness after normal activity.  The white part of the eye turns yellow (jaundice).  You have a decrease in the amount of urine or are urinating less often.  Your urine turns a dark color or changes to pink, red, or brown. Document Released: 02/09/2000 Document Revised: 05/06/2011 Document Reviewed: 09/28/2007 [facility] Patient Information 2014 [place], [place].  _______________________________________________________________________

## 2017-10-15 ENCOUNTER — Encounter (HOSPITAL_COMMUNITY): Payer: Self-pay

## 2017-10-15 ENCOUNTER — Other Ambulatory Visit: Payer: Self-pay

## 2017-10-15 ENCOUNTER — Encounter (HOSPITAL_COMMUNITY)
Admission: RE | Admit: 2017-10-15 | Discharge: 2017-10-15 | Disposition: A | Discharge status: Home / Self Care | Payer: BLUE CROSS/BLUE SHIELD | Source: Ambulatory Visit | Attending: Surgery | Admitting: Surgery

## 2017-10-15 DIAGNOSIS — Z01818 Encounter for other preprocedural examination: Secondary | ICD-10-CM | POA: Diagnosis not present

## 2017-10-15 HISTORY — DX: Fibromyalgia: M79.7

## 2017-10-15 LAB — CBC WITH DIFFERENTIAL/PLATELET
Basophils Absolute: 0 10*3/uL (ref 0.0–0.1)
Basophils Relative: 0 %
EOS ABS: 0.1 10*3/uL (ref 0.0–0.7)
Eosinophils Relative: 1 %
HCT: 42.3 % (ref 36.0–46.0)
Hemoglobin: 14.1 g/dL (ref 12.0–15.0)
LYMPHS ABS: 3.2 10*3/uL (ref 0.7–4.0)
LYMPHS PCT: 29 %
MCH: 27.9 pg (ref 26.0–34.0)
MCHC: 33.3 g/dL (ref 30.0–36.0)
MCV: 83.6 fL (ref 78.0–100.0)
MONOS PCT: 4 %
Monocytes Absolute: 0.4 10*3/uL (ref 0.1–1.0)
NEUTROS PCT: 66 %
Neutro Abs: 7.5 10*3/uL (ref 1.7–7.7)
Platelets: 248 10*3/uL (ref 150–400)
RBC: 5.06 MIL/uL (ref 3.87–5.11)
RDW: 13.7 % (ref 11.5–15.5)
WBC: 11.2 10*3/uL — AB (ref 4.0–10.5)

## 2017-10-15 LAB — COMPREHENSIVE METABOLIC PANEL
ALK PHOS: 94 U/L (ref 38–126)
ALT: 36 U/L (ref 0–44)
ANION GAP: 10 (ref 5–15)
AST: 31 U/L (ref 15–41)
Albumin: 3.9 g/dL (ref 3.5–5.0)
BILIRUBIN TOTAL: 0.5 mg/dL (ref 0.3–1.2)
BUN: 14 mg/dL (ref 6–20)
CALCIUM: 9.5 mg/dL (ref 8.9–10.3)
CO2: 31 mmol/L (ref 22–32)
Chloride: 103 mmol/L (ref 98–111)
Creatinine, Ser: 0.86 mg/dL (ref 0.44–1.00)
Glucose, Bld: 125 mg/dL — ABNORMAL HIGH (ref 70–99)
Potassium: 4.8 mmol/L (ref 3.5–5.1)
Sodium: 144 mmol/L (ref 135–145)
TOTAL PROTEIN: 7.6 g/dL (ref 6.5–8.1)

## 2017-10-15 LAB — HEMOGLOBIN A1C
Hgb A1c MFr Bld: 6.5 % — ABNORMAL HIGH (ref 4.8–5.6)
MEAN PLASMA GLUCOSE: 139.85 mg/dL

## 2017-10-16 ENCOUNTER — Other Ambulatory Visit: Payer: Self-pay | Admitting: Sports Medicine

## 2017-10-16 DIAGNOSIS — I10 Essential (primary) hypertension: Secondary | ICD-10-CM

## 2017-10-16 LAB — ABO/RH: ABO/RH(D): O NEG

## 2017-10-19 MED ORDER — BUPIVACAINE LIPOSOME 1.3 % IJ SUSP
20.0000 mL | Freq: Once | INTRAMUSCULAR | Status: DC
  Filled 2017-10-19: qty 20

## 2017-10-19 NOTE — Anesthesia Preprocedure Evaluation (Addendum)
Anesthesia Evaluation  Patient identified by MRN, date of birth, ID band Patient awake    Reviewed: Allergy & Precautions, NPO status , Patient's Chart, lab work & pertinent test results  History of Anesthesia Complications (+) PONV and history of anesthetic complications  Airway Mallampati: II  TM Distance: <3 FB Neck ROM: Full    Dental  (+) Dental Advisory Given, Teeth Intact, Caps   Pulmonary sleep apnea ,    breath sounds clear to auscultation       Cardiovascular hypertension, Pt. on medications and Pt. on home beta blockers  Rhythm:Regular Rate:Normal     Neuro/Psych  Headaches, Depression    GI/Hepatic negative GI ROS, Neg liver ROS,   Endo/Other  diabetes, Type obesity  Renal/GU negative Renal ROS  negative genitourinary   Musculoskeletal  (+) Arthritis , Osteoarthritis,  Fibromyalgia -  Abdominal (+) + obese,   Peds  Hematology negative hematology ROS (+)   Anesthesia Other Findings   Reproductive/Obstetrics  PCOS                            Anesthesia Physical Anesthesia Plan  ASA: III  Anesthesia Plan: General   Post-op Pain Management:    Induction: Intravenous  PONV Risk Score and Plan: 4 or greater and Treatment may vary due to age or medical condition, Ondansetron, Midazolam and TIVA  Airway Management Planned: Oral ETT  Additional Equipment: None  Intra-op Plan:   Post-operative Plan: Extubation in OR  Informed Consent: I have reviewed the patients History and Physical, chart, labs and discussed the procedure including the risks, benefits and alternatives for the proposed anesthesia with the patient or authorized representative who has indicated his/her understanding and acceptance.   Dental advisory given  Plan Discussed with: CRNA and Anesthesiologist  Anesthesia Plan Comments:        Anesthesia Quick Evaluation

## 2017-10-20 ENCOUNTER — Inpatient Hospital Stay (HOSPITAL_COMMUNITY): Payer: BLUE CROSS/BLUE SHIELD | Admitting: Anesthesiology

## 2017-10-20 ENCOUNTER — Encounter (HOSPITAL_COMMUNITY): Payer: Self-pay | Admitting: Anesthesiology

## 2017-10-20 ENCOUNTER — Inpatient Hospital Stay (HOSPITAL_COMMUNITY)
Admission: RE | Admit: 2017-10-20 | Discharge: 2017-10-22 | DRG: 621 | Disposition: A | Discharge status: Home / Self Care | Payer: BLUE CROSS/BLUE SHIELD | Attending: Surgery | Admitting: Surgery

## 2017-10-20 ENCOUNTER — Other Ambulatory Visit: Payer: Self-pay

## 2017-10-20 ENCOUNTER — Encounter (HOSPITAL_COMMUNITY)
Admission: RE | Disposition: A | Discharge status: Home / Self Care | Payer: Self-pay | Source: Home / Self Care | Attending: Surgery

## 2017-10-20 DIAGNOSIS — F329 Major depressive disorder, single episode, unspecified: Secondary | ICD-10-CM | POA: Diagnosis present

## 2017-10-20 DIAGNOSIS — E282 Polycystic ovarian syndrome: Secondary | ICD-10-CM | POA: Diagnosis present

## 2017-10-20 DIAGNOSIS — M797 Fibromyalgia: Secondary | ICD-10-CM | POA: Diagnosis present

## 2017-10-20 DIAGNOSIS — Z791 Long term (current) use of non-steroidal anti-inflammatories (NSAID): Secondary | ICD-10-CM

## 2017-10-20 DIAGNOSIS — N970 Female infertility associated with anovulation: Secondary | ICD-10-CM | POA: Diagnosis present

## 2017-10-20 DIAGNOSIS — G4733 Obstructive sleep apnea (adult) (pediatric): Secondary | ICD-10-CM | POA: Diagnosis present

## 2017-10-20 DIAGNOSIS — Z882 Allergy status to sulfonamides status: Secondary | ICD-10-CM

## 2017-10-20 DIAGNOSIS — Z888 Allergy status to other drugs, medicaments and biological substances status: Secondary | ICD-10-CM | POA: Diagnosis not present

## 2017-10-20 DIAGNOSIS — Z79899 Other long term (current) drug therapy: Secondary | ICD-10-CM

## 2017-10-20 DIAGNOSIS — Z880 Allergy status to penicillin: Secondary | ICD-10-CM | POA: Diagnosis not present

## 2017-10-20 DIAGNOSIS — Z881 Allergy status to other antibiotic agents status: Secondary | ICD-10-CM | POA: Diagnosis not present

## 2017-10-20 DIAGNOSIS — E119 Type 2 diabetes mellitus without complications: Secondary | ICD-10-CM | POA: Diagnosis not present

## 2017-10-20 DIAGNOSIS — M1710 Unilateral primary osteoarthritis, unspecified knee: Secondary | ICD-10-CM | POA: Diagnosis present

## 2017-10-20 DIAGNOSIS — Z6841 Body Mass Index (BMI) 40.0 and over, adult: Secondary | ICD-10-CM | POA: Diagnosis not present

## 2017-10-20 DIAGNOSIS — I10 Essential (primary) hypertension: Secondary | ICD-10-CM | POA: Diagnosis not present

## 2017-10-20 DIAGNOSIS — Z9884 Bariatric surgery status: Secondary | ICD-10-CM | POA: Diagnosis present

## 2017-10-20 HISTORY — PX: LAPAROSCOPIC GASTRIC SLEEVE RESECTION: SHX5895

## 2017-10-20 LAB — PREGNANCY, URINE: Preg Test, Ur: NEGATIVE

## 2017-10-20 LAB — TYPE AND SCREEN
ABO/RH(D): O NEG
ANTIBODY SCREEN: NEGATIVE

## 2017-10-20 SURGERY — GASTRECTOMY, SLEEVE, LAPAROSCOPIC
Anesthesia: General

## 2017-10-20 MED ORDER — OXYCODONE HCL 5 MG/5ML PO SOLN
5.0000 mg | ORAL | Status: DC | PRN
  Administered 2017-10-21: 5 mg via ORAL
  Filled 2017-10-20: qty 5

## 2017-10-20 MED ORDER — PROPOFOL 10 MG/ML IV BOLUS
INTRAVENOUS | Status: AC
  Filled 2017-10-20: qty 60

## 2017-10-20 MED ORDER — SUCCINYLCHOLINE CHLORIDE 200 MG/10ML IV SOSY
PREFILLED_SYRINGE | INTRAVENOUS | Status: AC
  Filled 2017-10-20: qty 10

## 2017-10-20 MED ORDER — SUGAMMADEX SODIUM 500 MG/5ML IV SOLN
INTRAVENOUS | Status: AC
  Filled 2017-10-20: qty 5

## 2017-10-20 MED ORDER — CHLORHEXIDINE GLUCONATE CLOTH 2 % EX PADS: 6.0000 | MEDICATED_PAD | Freq: Once | CUTANEOUS | Status: DC

## 2017-10-20 MED ORDER — SODIUM CHLORIDE 0.9 % IV SOLN
2.0000 g | INTRAVENOUS | Status: AC
  Administered 2017-10-20: 2 g via INTRAVENOUS
  Filled 2017-10-20: qty 2

## 2017-10-20 MED ORDER — PHENYLEPHRINE 40 MCG/ML (10ML) SYRINGE FOR IV PUSH (FOR BLOOD PRESSURE SUPPORT)
PREFILLED_SYRINGE | INTRAVENOUS | Status: AC
  Filled 2017-10-20: qty 10

## 2017-10-20 MED ORDER — FENTANYL CITRATE (PF) 100 MCG/2ML IJ SOLN: 25.0000 ug | INTRAMUSCULAR | Status: DC | PRN

## 2017-10-20 MED ORDER — FENTANYL CITRATE (PF) 250 MCG/5ML IJ SOLN
INTRAMUSCULAR | Status: AC
  Filled 2017-10-20: qty 5

## 2017-10-20 MED ORDER — STERILE WATER FOR IRRIGATION IR SOLN
Status: DC | PRN
  Administered 2017-10-20: 1000 mL

## 2017-10-20 MED ORDER — HYDRALAZINE HCL 20 MG/ML IJ SOLN
10.0000 mg | INTRAMUSCULAR | Status: DC | PRN
  Administered 2017-10-21: 10 mg via INTRAVENOUS
  Filled 2017-10-20: qty 1

## 2017-10-20 MED ORDER — DOCUSATE SODIUM 100 MG PO CAPS
100.0000 mg | ORAL_CAPSULE | Freq: Two times a day (BID) | ORAL | Status: DC
  Administered 2017-10-20 – 2017-10-22 (×3): 100 mg via ORAL
  Filled 2017-10-20 (×4): qty 1

## 2017-10-20 MED ORDER — LACTATED RINGERS IV SOLN
INTRAVENOUS | Status: DC
  Administered 2017-10-20 (×2): via INTRAVENOUS

## 2017-10-20 MED ORDER — LIDOCAINE 2% (20 MG/ML) 5 ML SYRINGE
INTRAMUSCULAR | Status: DC | PRN
  Administered 2017-10-20: 100 mg via INTRAVENOUS

## 2017-10-20 MED ORDER — BUPIVACAINE-EPINEPHRINE (PF) 0.25% -1:200000 IJ SOLN
INTRAMUSCULAR | Status: AC
  Filled 2017-10-20: qty 30

## 2017-10-20 MED ORDER — AMLODIPINE BESYLATE 10 MG PO TABS
10.0000 mg | ORAL_TABLET | Freq: Every day | ORAL | Status: DC
  Administered 2017-10-21 – 2017-10-22 (×2): 10 mg via ORAL
  Filled 2017-10-20 (×3): qty 1

## 2017-10-20 MED ORDER — ONDANSETRON HCL 4 MG/2ML IJ SOLN
INTRAMUSCULAR | Status: AC
  Filled 2017-10-20: qty 2

## 2017-10-20 MED ORDER — PROPOFOL 10 MG/ML IV BOLUS
INTRAVENOUS | Status: AC
  Filled 2017-10-20: qty 20

## 2017-10-20 MED ORDER — LIDOCAINE 2% (20 MG/ML) 5 ML SYRINGE
INTRAMUSCULAR | Status: AC
  Filled 2017-10-20: qty 5

## 2017-10-20 MED ORDER — ACETAMINOPHEN 500 MG PO TABS
1000.0000 mg | ORAL_TABLET | ORAL | Status: AC
  Administered 2017-10-20: 1000 mg via ORAL
  Filled 2017-10-20: qty 2

## 2017-10-20 MED ORDER — PROPOFOL 10 MG/ML IV BOLUS
INTRAVENOUS | Status: AC
  Filled 2017-10-20: qty 40

## 2017-10-20 MED ORDER — ACETAMINOPHEN 160 MG/5ML PO SOLN
650.0000 mg | Freq: Four times a day (QID) | ORAL | Status: DC
  Administered 2017-10-21: 650 mg via ORAL
  Filled 2017-10-20 (×4): qty 20.3

## 2017-10-20 MED ORDER — MIDAZOLAM HCL 2 MG/2ML IJ SOLN
INTRAMUSCULAR | Status: AC
  Filled 2017-10-20: qty 2

## 2017-10-20 MED ORDER — FENTANYL CITRATE (PF) 250 MCG/5ML IJ SOLN
INTRAMUSCULAR | Status: DC | PRN
  Administered 2017-10-20: 150 ug via INTRAVENOUS
  Administered 2017-10-20: 50 ug via INTRAVENOUS

## 2017-10-20 MED ORDER — SCOPOLAMINE 1 MG/3DAYS TD PT72
1.0000 | MEDICATED_PATCH | TRANSDERMAL | Status: DC
  Administered 2017-10-20: 1.5 mg via TRANSDERMAL
  Filled 2017-10-20: qty 1

## 2017-10-20 MED ORDER — ENOXAPARIN SODIUM 30 MG/0.3ML ~~LOC~~ SOLN
30.0000 mg | Freq: Two times a day (BID) | SUBCUTANEOUS | Status: DC
  Administered 2017-10-20 – 2017-10-22 (×4): 30 mg via SUBCUTANEOUS
  Filled 2017-10-20 (×4): qty 0.3

## 2017-10-20 MED ORDER — ROCURONIUM BROMIDE 100 MG/10ML IV SOLN
INTRAVENOUS | Status: AC
  Filled 2017-10-20: qty 1

## 2017-10-20 MED ORDER — DEXAMETHASONE SODIUM PHOSPHATE 10 MG/ML IJ SOLN
INTRAMUSCULAR | Status: DC | PRN
  Administered 2017-10-20: 10 mg via INTRAVENOUS
  Administered 2017-10-20: 400 mg via INTRAVENOUS

## 2017-10-20 MED ORDER — PREMIER PROTEIN SHAKE
2.0000 [oz_av] | ORAL | Status: DC
  Administered 2017-10-21 – 2017-10-22 (×10): 2 [oz_av] via ORAL

## 2017-10-20 MED ORDER — ONDANSETRON HCL 4 MG/2ML IJ SOLN
4.0000 mg | INTRAMUSCULAR | Status: DC | PRN
  Administered 2017-10-20 – 2017-10-21 (×7): 4 mg via INTRAVENOUS
  Filled 2017-10-20 (×6): qty 2

## 2017-10-20 MED ORDER — METOPROLOL TARTRATE 50 MG PO TABS
50.0000 mg | ORAL_TABLET | Freq: Every day | ORAL | Status: DC
  Administered 2017-10-21 – 2017-10-22 (×2): 50 mg via ORAL
  Filled 2017-10-20 (×3): qty 1

## 2017-10-20 MED ORDER — METOPROLOL TARTRATE 5 MG/5ML IV SOLN: 5.0000 mg | Freq: Four times a day (QID) | INTRAVENOUS | Status: DC | PRN

## 2017-10-20 MED ORDER — SIMETHICONE 80 MG PO CHEW: 80.0000 mg | CHEWABLE_TABLET | Freq: Four times a day (QID) | ORAL | Status: DC | PRN

## 2017-10-20 MED ORDER — KETAMINE HCL 10 MG/ML IJ SOLN
INTRAMUSCULAR | Status: AC
  Filled 2017-10-20: qty 1

## 2017-10-20 MED ORDER — GABAPENTIN 300 MG PO CAPS
300.0000 mg | ORAL_CAPSULE | ORAL | Status: AC
  Administered 2017-10-20: 300 mg via ORAL
  Filled 2017-10-20: qty 1

## 2017-10-20 MED ORDER — HYDROMORPHONE HCL 1 MG/ML IJ SOLN: 0.5000 mg | INTRAMUSCULAR | Status: DC | PRN

## 2017-10-20 MED ORDER — ONDANSETRON HCL 4 MG/2ML IJ SOLN
INTRAMUSCULAR | Status: DC | PRN
  Administered 2017-10-20: 4 mg via INTRAVENOUS

## 2017-10-20 MED ORDER — DEXAMETHASONE SODIUM PHOSPHATE 4 MG/ML IJ SOLN: 4.0000 mg | INTRAMUSCULAR | Status: DC

## 2017-10-20 MED ORDER — PHENYLEPHRINE 40 MCG/ML (10ML) SYRINGE FOR IV PUSH (FOR BLOOD PRESSURE SUPPORT)
PREFILLED_SYRINGE | INTRAVENOUS | Status: DC | PRN
  Administered 2017-10-20: 80 ug via INTRAVENOUS

## 2017-10-20 MED ORDER — LIDOCAINE 2% (20 MG/ML) 5 ML SYRINGE
INTRAMUSCULAR | Status: DC | PRN
  Administered 2017-10-20: 1.5 mg/kg/h via INTRAVENOUS

## 2017-10-20 MED ORDER — KETAMINE HCL 10 MG/ML IJ SOLN
INTRAMUSCULAR | Status: DC | PRN
  Administered 2017-10-20: 50 mg via INTRAVENOUS

## 2017-10-20 MED ORDER — DEXAMETHASONE SODIUM PHOSPHATE 10 MG/ML IJ SOLN
INTRAMUSCULAR | Status: AC
  Filled 2017-10-20: qty 1

## 2017-10-20 MED ORDER — PROPOFOL 500 MG/50ML IV EMUL
INTRAVENOUS | Status: DC | PRN
  Administered 2017-10-20: 160 ug/kg/min via INTRAVENOUS

## 2017-10-20 MED ORDER — SODIUM CHLORIDE 0.9 % IV SOLN
INTRAVENOUS | Status: DC
  Administered 2017-10-20 – 2017-10-22 (×6): via INTRAVENOUS

## 2017-10-20 MED ORDER — PROMETHAZINE HCL 25 MG/ML IJ SOLN
6.2500 mg | INTRAMUSCULAR | Status: AC | PRN
  Administered 2017-10-20 (×2): 12.5 mg via INTRAVENOUS

## 2017-10-20 MED ORDER — OXYCODONE HCL 5 MG PO TABS: 5.0000 mg | ORAL_TABLET | Freq: Once | ORAL | Status: DC | PRN

## 2017-10-20 MED ORDER — GABAPENTIN 100 MG PO CAPS
200.0000 mg | ORAL_CAPSULE | Freq: Two times a day (BID) | ORAL | Status: DC
  Administered 2017-10-20 – 2017-10-22 (×3): 200 mg via ORAL
  Filled 2017-10-20 (×3): qty 2

## 2017-10-20 MED ORDER — BUPIVACAINE-EPINEPHRINE 0.25% -1:200000 IJ SOLN
INTRAMUSCULAR | Status: DC | PRN
  Administered 2017-10-20: 30 mL

## 2017-10-20 MED ORDER — LIDOCAINE HCL 2 % IJ SOLN
INTRAMUSCULAR | Status: AC
  Filled 2017-10-20: qty 20

## 2017-10-20 MED ORDER — BUPIVACAINE LIPOSOME 1.3 % IJ SUSP
INTRAMUSCULAR | Status: DC | PRN
  Administered 2017-10-20: 20 mL

## 2017-10-20 MED ORDER — PROPOFOL 10 MG/ML IV BOLUS
INTRAVENOUS | Status: DC | PRN
  Administered 2017-10-20: 20 mg via INTRAVENOUS
  Administered 2017-10-20: 200 mg via INTRAVENOUS

## 2017-10-20 MED ORDER — FENTANYL CITRATE (PF) 100 MCG/2ML IJ SOLN
INTRAMUSCULAR | Status: AC
  Filled 2017-10-20: qty 2

## 2017-10-20 MED ORDER — DULOXETINE HCL 60 MG PO CPEP
60.0000 mg | ORAL_CAPSULE | Freq: Two times a day (BID) | ORAL | Status: DC
  Administered 2017-10-21 – 2017-10-22 (×2): 60 mg via ORAL
  Filled 2017-10-20 (×4): qty 1

## 2017-10-20 MED ORDER — MIDAZOLAM HCL 2 MG/2ML IJ SOLN
INTRAMUSCULAR | Status: DC | PRN
  Administered 2017-10-20: 2 mg via INTRAVENOUS

## 2017-10-20 MED ORDER — LACTATED RINGERS IR SOLN
Status: DC | PRN
  Administered 2017-10-20: 1000 mL

## 2017-10-20 MED ORDER — TRAMADOL HCL 50 MG PO TABS: 50.0000 mg | ORAL_TABLET | Freq: Four times a day (QID) | ORAL | Status: DC | PRN

## 2017-10-20 MED ORDER — ROCURONIUM BROMIDE 10 MG/ML (PF) SYRINGE
PREFILLED_SYRINGE | INTRAVENOUS | Status: DC | PRN
  Administered 2017-10-20: 10 mg via INTRAVENOUS
  Administered 2017-10-20: 50 mg via INTRAVENOUS
  Administered 2017-10-20: 20 mg via INTRAVENOUS

## 2017-10-20 MED ORDER — PROMETHAZINE HCL 25 MG/ML IJ SOLN
INTRAMUSCULAR | Status: AC
  Filled 2017-10-20: qty 1

## 2017-10-20 MED ORDER — ENOXAPARIN SODIUM 40 MG/0.4ML ~~LOC~~ SOLN
40.0000 mg | SUBCUTANEOUS | Status: AC
  Administered 2017-10-20: 40 mg via SUBCUTANEOUS
  Filled 2017-10-20: qty 0.4

## 2017-10-20 MED ORDER — APREPITANT 40 MG PO CAPS
40.0000 mg | ORAL_CAPSULE | ORAL | Status: AC
  Administered 2017-10-20: 40 mg via ORAL
  Filled 2017-10-20: qty 1

## 2017-10-20 MED ORDER — FAMOTIDINE IN NACL 20-0.9 MG/50ML-% IV SOLN
20.0000 mg | Freq: Two times a day (BID) | INTRAVENOUS | Status: DC
  Administered 2017-10-20 – 2017-10-21 (×4): 20 mg via INTRAVENOUS
  Filled 2017-10-20 (×8): qty 50

## 2017-10-20 MED ORDER — OXYCODONE HCL 5 MG/5ML PO SOLN
5.0000 mg | Freq: Once | ORAL | Status: DC | PRN
  Filled 2017-10-20: qty 5

## 2017-10-20 SURGICAL SUPPLY — 67 items
APL SKNCLS STERI-STRIP NONHPOA (GAUZE/BANDAGES/DRESSINGS) ×1
APPLIER CLIP ROT 10 11.4 M/L (STAPLE)
APPLIER CLIP ROT 13.4 12 LRG (CLIP)
APR CLP LRG 13.4X12 ROT 20 MLT (CLIP)
APR CLP MED LRG 11.4X10 (STAPLE)
BAG LAPAROSCOPIC 12 15 PORT 16 (BASKET) IMPLANT
BAG RETRIEVAL 12/15 (BASKET)
BANDAGE ADH SHEER 1  50/CT (GAUZE/BANDAGES/DRESSINGS) ×12 IMPLANT
BENZOIN TINCTURE PRP APPL 2/3 (GAUZE/BANDAGES/DRESSINGS) ×2 IMPLANT
BLADE SURG SZ11 CARB STEEL (BLADE) ×2 IMPLANT
CABLE HIGH FREQUENCY MONO STRZ (ELECTRODE) ×2 IMPLANT
CHLORAPREP W/TINT 26ML (MISCELLANEOUS) ×4 IMPLANT
CLIP APPLIE ROT 10 11.4 M/L (STAPLE) IMPLANT
CLIP APPLIE ROT 13.4 12 LRG (CLIP) IMPLANT
COVER SURGICAL LIGHT HANDLE (MISCELLANEOUS) ×2 IMPLANT
DECANTER SPIKE VIAL GLASS SM (MISCELLANEOUS) ×2 IMPLANT
DEVICE SUT QUICK LOAD TK 5 (STAPLE) IMPLANT
DEVICE SUT TI-KNOT TK 5X26 (MISCELLANEOUS) IMPLANT
DRAPE UTILITY XL STRL (DRAPES) ×4 IMPLANT
ELECT REM PT RETURN 15FT ADLT (MISCELLANEOUS) ×2 IMPLANT
GAUZE SPONGE 4X4 12PLY STRL (GAUZE/BANDAGES/DRESSINGS) IMPLANT
GLOVE BIO SURGEON STRL SZ 6 (GLOVE) ×2 IMPLANT
GLOVE INDICATOR 6.5 STRL GRN (GLOVE) ×2 IMPLANT
GOWN STRL REUS W/TWL LRG LVL3 (GOWN DISPOSABLE) ×3 IMPLANT
GOWN STRL REUS W/TWL XL LVL3 (GOWN DISPOSABLE) ×4 IMPLANT
GRASPER SUT TROCAR 14GX15 (MISCELLANEOUS) ×2 IMPLANT
HOVERMATT SINGLE USE (MISCELLANEOUS) ×2 IMPLANT
KIT BASIN OR (CUSTOM PROCEDURE TRAY) ×2 IMPLANT
MARKER SKIN DUAL TIP RULER LAB (MISCELLANEOUS) ×2 IMPLANT
NDL SPNL 22GX3.5 QUINCKE BK (NEEDLE) ×1 IMPLANT
NEEDLE SPNL 22GX3.5 QUINCKE BK (NEEDLE) ×2 IMPLANT
PACK UNIVERSAL I (CUSTOM PROCEDURE TRAY) ×2 IMPLANT
RELOAD ENDO STITCH (ENDOMECHANICALS) IMPLANT
RELOAD STAPLE 60 3.6 BLU REG (STAPLE) ×1 IMPLANT
RELOAD STAPLE 60 3.8 GOLD REG (STAPLE) ×1 IMPLANT
RELOAD STAPLE 60 4.1 GRN THCK (STAPLE) IMPLANT
RELOAD STAPLER BLUE 60MM (STAPLE) ×2 IMPLANT
RELOAD STAPLER GOLD 60MM (STAPLE) ×2 IMPLANT
RELOAD STAPLER GREEN 60MM (STAPLE) ×1 IMPLANT
RELOAD SUT TRIPLE-STITCH 2-0 (ENDOMECHANICALS) IMPLANT
SCISSORS LAP 5X45 EPIX DISP (ENDOMECHANICALS) ×2 IMPLANT
SET IRRIG TUBING LAPAROSCOPIC (IRRIGATION / IRRIGATOR) ×2 IMPLANT
SHEARS HARMONIC ACE PLUS 45CM (MISCELLANEOUS) ×2 IMPLANT
SLEEVE ADV FIXATION 5X100MM (TROCAR) ×4 IMPLANT
SLEEVE GASTRECTOMY 40FR VISIGI (MISCELLANEOUS) ×2 IMPLANT
SOLUTION ANTI FOG 6CC (MISCELLANEOUS) ×2 IMPLANT
SPONGE LAP 18X18 RF (DISPOSABLE) ×2 IMPLANT
STAPLER ECHELON BIOABSB 60 FLE (MISCELLANEOUS) ×9 IMPLANT
STAPLER ECHELON LONG 60 440 (INSTRUMENTS) ×2 IMPLANT
STAPLER RELOAD BLUE 60MM (STAPLE) ×4
STAPLER RELOAD GOLD 60MM (STAPLE) ×4
STAPLER RELOAD GREEN 60MM (STAPLE) ×2
STRIP CLOSURE SKIN 1/2X4 (GAUZE/BANDAGES/DRESSINGS) ×2 IMPLANT
SUT MNCRL AB 4-0 PS2 18 (SUTURE) ×3 IMPLANT
SUT SURGIDAC NAB ES-9 0 48 120 (SUTURE) IMPLANT
SUT VICRYL 0 TIES 12 18 (SUTURE) ×2 IMPLANT
SYR 10ML ECCENTRIC (SYRINGE) ×2 IMPLANT
SYR 20CC LL (SYRINGE) ×2 IMPLANT
SYR 50ML LL SCALE MARK (SYRINGE) ×2 IMPLANT
TOWEL OR 17X26 10 PK STRL BLUE (TOWEL DISPOSABLE) ×2 IMPLANT
TOWEL OR NON WOVEN STRL DISP B (DISPOSABLE) ×2 IMPLANT
TROCAR ADV FIXATION 5X100MM (TROCAR) ×2 IMPLANT
TROCAR BLADELESS 15MM (ENDOMECHANICALS) ×2 IMPLANT
TROCAR BLADELESS OPT 5 100 (ENDOMECHANICALS) ×2 IMPLANT
TUBING CONNECTING 10 (TUBING) ×2 IMPLANT
TUBING ENDO SMARTCAP (MISCELLANEOUS) ×2 IMPLANT
TUBING INSUF HEATED (TUBING) ×2 IMPLANT

## 2017-10-20 NOTE — Op Note (Signed)
Procedure: Upper GI endoscopy  Description of procedure: Upper GI endoscopy is performed at the completion of laparoscopic sleeve gastrectomy by Dr. Fredricka Bonineonnor.  The video endoscope was introduced into the upper esophagus and then passed to the EG junction at about 40 cm. The esophagus appeared normal. The gastric sleeve was entered. The sleeve was tensely distended with air while the outlet was obstructed under saline irrigation by the operating surgeon. There was no evidence of leak. The staple line was intact and without bleeding. The scope was advanced to the antrum and pylorus visualized. There was no stricture or twisting or mucosal abnormality, and particularly no narrowing noted at the incisura.  The pouch was then desufflated and the scope withdrawn.  Mariella SaaBenjamin T Paulina Muchmore MD, FACS  10/20/2017, 8:40 AM

## 2017-10-20 NOTE — Transfer of Care (Signed)
Immediate Anesthesia Transfer of Care Note  Patient: Chelsea [name]  Procedure(s) Performed: LAPAROSCOPIC GASTRIC SLEEVE RESECTION, UPPER ENDO, ERAS Pathway (N/A )  Patient Location: PACU  Anesthesia Type:General  Level of Consciousness: awake  Airway & Oxygen Therapy: Patient Spontanous Breathing and Patient connected to face mask oxygen  Post-op Assessment: Report given to RN and Post -op Vital signs reviewed and stable  Post vital signs: Reviewed and stable  Last Vitals:  Vitals Value Taken Time  BP    Temp    Pulse    Resp    SpO2      Last Pain:  Vitals:   10/20/17 0609  TempSrc:   PainSc: 0-No pain         Complications: No apparent anesthesia complications

## 2017-10-20 NOTE — Progress Notes (Signed)
Pt started drinking first 2oz cup of water at 1530.

## 2017-10-20 NOTE — Anesthesia Postprocedure Evaluation (Signed)
Anesthesia Post Note  Patient: Addison LankCarla Mendibles  Procedure(s) Performed: LAPAROSCOPIC GASTRIC SLEEVE RESECTION, UPPER ENDO, ERAS Pathway (N/A )     Patient location during evaluation: PACU Anesthesia Type: General Level of consciousness: awake and alert Pain management: pain level controlled Vital Signs Assessment: post-procedure vital signs reviewed and stable Respiratory status: spontaneous breathing, nonlabored ventilation and respiratory function stable Cardiovascular status: blood pressure returned to baseline and stable Postop Assessment: no apparent nausea or vomiting Anesthetic complications: no    Last Vitals:  Vitals:   10/20/17 1045 10/20/17 1100  BP:  (!) 172/107  Pulse: 68 70  Resp: 16 16  Temp:  36.7 C  SpO2: 97% 97%    Last Pain:  Vitals:   10/20/17 1100  TempSrc: Oral  PainSc:                  Beryle Lathehomas E Brock

## 2017-10-20 NOTE — Progress Notes (Addendum)
PHARMACY CONSULT FOR:  Risk Assessment for Post-Discharge VTE Following Bariatric Surgery  Post-Discharge VTE Risk Assessment: This patient's probability of 30-day post-discharge VTE is increased due to:   Female    Age >/=60 years  X  BMI >/=50 kg/m2    CHF    Dyspnea at Rest    Paraplegia   X Non-gastric-band surgery    Operation Time >/=3 hr    Return to OR     Length of Stay >/= 3 d   Predicted probability of 30-day post-discharge VTE: 0.27%  Other patient-specific factors to consider:  N/A   Recommendation for Discharge: No pharmacological prophylaxis post-discharge    Chelsea LankCarla [name] is a 32 y.o. female who underwent laparoscopic sleeve gastrectomy on 10/20/2017    Case start: 0745 Case end: 0855   Allergies  Allergen Reactions  . Amoxicillin Other (See Comments)    Caused yeast infection Has patient had a PCN reaction causing immediate rash, facial/tongue/throat swelling, SOB or lightheadedness with hypotension: Unknown Has patient had a PCN reaction causing severe rash involving mucus membranes or skin necrosis: No Has patient had a PCN reaction that required hospitalization: No Has patient had a PCN reaction occurring within the last 10 years: No Childhood allergy If all of the above answers are "NO", then may proceed with Cephalosporin use.   Corky Sox. Ceclor [Cefaclor] Other (See Comments)    Caused yeast infection  . Enalapril Maleate Swelling    lip swelling  . Erythromycin Diarrhea  . Penicillins Other (See Comments)    Caused yeast infection Has patient had a PCN reaction causing immediate rash, facial/tongue/throat swelling, SOB or lightheadedness with hypotension: Unknown Has patient had a PCN reaction causing severe rash involving mucus membranes or skin necrosis: No Has patient had a PCN reaction that required hospitalization: No Has patient had a PCN reaction occurring within the last 10 years: No Childhood allergy If all of the above answers are "NO",  then may proceed with Cephalosporin use.    . Sulfa Antibiotics Other (See Comments)    Unsure of reaction type    Patient Measurements: Height: 5\' 9"  (175.3 cm) Weight: (!) 342 lb (155.1 kg) IBW/kg (Calculated) : 66.2 Body mass index is 50.5 kg/m.  No results for input(s): WBC, HGB, HCT, PLT, APTT, CREATININE, LABCREA, CREATININE, CREAT24HRUR, MG, PHOS, ALBUMIN, PROT, ALBUMIN, AST, ALT, ALKPHOS, BILITOT, BILIDIR, IBILI in the last 72 hours. Estimated Creatinine Clearance: 155.1 mL/min (by C-G formula based on SCr of 0.86 mg/dL).    Past Medical History:  Diagnosis Date  . Depression   . Endometrial polyp   . Fibromyalgia   . History of recurrent UTIs   . Hypertension   . Infertility, anovulation   . Myofascial muscle pain   . OA (osteoarthritis) of knee    BILATERAL  . PCOS (polycystic ovarian syndrome)   . PONV (postoperative nausea and vomiting)   . Seasonal allergic rhinitis      Medications Prior to Admission  Medication Sig Dispense Refill Last Dose  . amLODipine (NORVASC) 10 MG tablet TAKE 1 TABLET BY MOUTH EVERY DAY 90 tablet 0 Past Week at Unknown time  . DULoxetine (CYMBALTA) 60 MG capsule Take 2 capsules (120 mg total) by mouth daily. (Patient taking differently: Take 60 mg by mouth 2 (two) times daily. ) 180 capsule 1 10/20/2017 at Unknown time  . metoprolol tartrate (LOPRESSOR) 50 MG tablet Take 1 tablet (50 mg total) by mouth 2 (two) times daily. (Patient taking differently: Take 50 mg  by mouth daily. ) 180 tablet 1 10/20/2017 at 0400  . triamcinolone (KENALOG) 0.1 % paste Use as directed 1 application in the mouth or throat 2 (two) times daily. (Patient taking differently: Use as directed 1 application in the mouth or throat 2 (two) times daily as needed (for ulcers.). ) 5 g 11 Past Week at Unknown time  . valsartan-hydrochlorothiazide (DIOVAN-HCT) 320-25 MG tablet Take 1 tablet by mouth daily.   Past Week at Unknown time  . meloxicam (MOBIC) 15 MG tablet Take  1 tablet (15 mg total) by mouth daily as needed for pain. (Patient not taking: Reported on 10/08/2017) 20 tablet 0 Not Taking at Unknown time  . ondansetron (ZOFRAN-ODT) 4 MG disintegrating tablet Take 4 mg by mouth every 8 (eight) hours as needed for nausea/vomiting.  0 Unknown at Unknown time  . oxyCODONE (ROXICODONE) 5 MG/5ML solution Take 5-10 mLs by mouth every 4 (four) hours as needed for pain.  0 Unknown at Unknown time  . pantoprazole (PROTONIX) 40 MG tablet Take 40 mg by mouth daily.  3 Unknown at Unknown time  . rizatriptan (MAXALT) 10 MG tablet Take 1 tablet (10 mg total) by mouth as needed for migraine. May repeat in 2 hours if needed 90 tablet 0 More than a month at Unknown time  . valACYclovir (VALTREX) 1000 MG tablet Take 1 tablet (1,000 mg total) by mouth 2 (two) times daily. (Patient taking differently: Take 1,000 mg by mouth 2 (two) times daily as needed (for cold sores.). ) 20 tablet 0 More than a month at Unknown time    Elie Goody, PharmD, BCPS [phone] 10/20/2017  9:52 AM

## 2017-10-20 NOTE — Anesthesia Procedure Notes (Signed)
Procedure Name: Intubation Date/Time: 10/20/2017 7:23 AM Performed by: Florene Routeeardon, Kamla Skilton L, CRNA Patient Re-evaluated:Patient Re-evaluated prior to induction Oxygen Delivery Method: Circle system utilized Preoxygenation: Pre-oxygenation with 100% oxygen Induction Type: IV induction Ventilation: Mask ventilation without difficulty and Oral airway inserted - appropriate to patient size Laryngoscope Size: Hyacinth MeekerMiller and 2 Grade View: Grade I Tube type: Oral Tube size: 7.5 mm Number of attempts: 1 Airway Equipment and Method: Stylet Placement Confirmation: ETT inserted through vocal cords under direct vision and positive ETCO2 Secured at: 23 cm Tube secured with: Tape Dental Injury: Teeth and Oropharynx as per pre-operative assessment

## 2017-10-20 NOTE — Op Note (Signed)
Operative Note  Chelsea LankCarla Silsby  [id number]  [id number]  10/20/2017   Surgeon: Lady Deutscherhelsea A ConnorMD  Assistant: Jaclynn GuarneriBen Hoxworth MD  Procedure performed: laparoscopic sleeve gastrectomy, upper endoscopy  Preop diagnosis: Morbid obesity Body mass index is 50.5 kg/m., PCOS, osteoarthritis, myofascial muscle pain, infertility/ anovulation, hypertension, fibromyalgia, depression Post-op diagnosis/intraop findings: same  Specimens: fundus Retained items: none EBL: minimal cc Complications: none  Description of procedure: After obtaining informed consent and administration of prophylactic lovenox in holding, the patient was taken to the operating room and placed supine on operating room table wheregeneral endotracheal anesthesia was initiated, preoperative antibiotics were administered, SCDs applied, and a formal timeout was performed. The abdomen was prepped and draped in usual sterile fashion. Peritoneal access was gained using a Visiport technique in the left upper quadrant and insufflation to 15 mmHg ensued without issue. Gross inspection revealed no evidence of injury. Under direct visualization three more 5 mm trochars were placed in the right and left hemiabdomen and the 15mm trocar in the right paramedian upper abdomen. Bilateral laparoscopic assisted TAPS blocks were performed with Exparel diluted with 0.25 percent Marcaine with epinephrine. The patient was placed in steep Trendelenburg and the liver retractor was introduced through an incision in the upper midline and secured to the post externally to maintain the left lobe retracted anteriorly. There was no evidence of hiatal hernia. Using the Harmonic scalpel, the greater curvature of the stomach was dissected away from the greater omentum and short gastric vessels were divided. This began 6 cm from the pylorus, and dissection proceeded until the left crus was clearly exposed. Esophageal fat pad was mobilized off the anterior stomach  slightly. The 2240 [place] VisiGi was then introduced and directed down towards the pylorus. This was placed to suction against the lesser curve. Serial fires of the linear cutting stapler with seamguards were then employed to create our sleeve. The first fire used a green load and ensured adequate room at the angularis incisura. One gold load and then 3 blue loads were then employed to create a narrow tubular stomach all up to the angle of His. The excised stomach was then removed through our 15 mm trocar site within an Endo Catch bag. The visigi was taken off of suction and a few puffs of air were introduced, inflating the sleeve. No bubbles were observed and the irrigation fluid around the stomach and the shape was noted to be a nice smooth tube without any narrowing at the angularis. The visigi was then removed. Upper endoscopy was performed by the assistant surgeon and the sleeve was noted to be airtight, the staple line was hemostatic. Please see his separate note. The endoscope was removed. The 15 mm trocar site fascia in the right upper abdomen was closed with 2 interrupted sutures of 0 Vicryl using the laparoscopic suture passer under direct visualization. The liver retractor was removed under direct visualization. The abdomen was then desufflated and all remaining trochars removed. The skin incisions were closed with running subcuticular Monocryl; benzoin, Steri-Strips and Band-Aids were applied The patient was then awakened, extubated and taken to PACU in stable condition.    All counts were correct at the completion of the case.

## 2017-10-20 NOTE — Progress Notes (Signed)
Discussed post op day goals with patient including ambulation, IS, diet progression, pain, and nausea control.  Questions answered. 

## 2017-10-20 NOTE — Interval H&P Note (Signed)
History and Physical Interval Note:  10/20/2017 7:03 AM  Chelsea [name]  has presented today for surgery, with the diagnosis of Morbid Obesity  The various methods of treatment have been discussed with the patient and family. After consideration of risks, benefits and other options for treatment, the patient has consented to  Procedure(s): LAPAROSCOPIC GASTRIC SLEEVE RESECTION, UPPER ENDO, ERAS Pathway (N/A) as a surgical intervention .  The patient's history has been reviewed, patient examined, no change in status, stable for surgery.  I have reviewed the patient's chart and labs.  Questions were answered to the patient's satisfaction.     Charlotta Lapaglia Lollie SailsA Jerelene Salaam

## 2017-10-21 ENCOUNTER — Encounter (HOSPITAL_COMMUNITY): Payer: Self-pay | Admitting: Surgery

## 2017-10-21 LAB — COMPREHENSIVE METABOLIC PANEL
ALK PHOS: 76 U/L (ref 38–126)
ALT: 32 U/L (ref 0–44)
AST: 24 U/L (ref 15–41)
Albumin: 3.4 g/dL — ABNORMAL LOW (ref 3.5–5.0)
Anion gap: 8 (ref 5–15)
BILIRUBIN TOTAL: 0.4 mg/dL (ref 0.3–1.2)
BUN: 10 mg/dL (ref 6–20)
CALCIUM: 8.4 mg/dL — AB (ref 8.9–10.3)
CO2: 27 mmol/L (ref 22–32)
CREATININE: 0.86 mg/dL (ref 0.44–1.00)
Chloride: 108 mmol/L (ref 98–111)
Glucose, Bld: 127 mg/dL — ABNORMAL HIGH (ref 70–99)
Potassium: 3.5 mmol/L (ref 3.5–5.1)
Sodium: 143 mmol/L (ref 135–145)
Total Protein: 6.7 g/dL (ref 6.5–8.1)

## 2017-10-21 LAB — CBC WITH DIFFERENTIAL/PLATELET
BASOS ABS: 0 10*3/uL (ref 0.0–0.1)
Basophils Relative: 0 %
EOS PCT: 0 %
Eosinophils Absolute: 0 10*3/uL (ref 0.0–0.7)
HEMATOCRIT: 36.5 % (ref 36.0–46.0)
Hemoglobin: 12.1 g/dL (ref 12.0–15.0)
LYMPHS PCT: 18 %
Lymphs Abs: 2.3 10*3/uL (ref 0.7–4.0)
MCH: 27.6 pg (ref 26.0–34.0)
MCHC: 33.2 g/dL (ref 30.0–36.0)
MCV: 83.1 fL (ref 78.0–100.0)
Monocytes Absolute: 0.6 10*3/uL (ref 0.1–1.0)
Monocytes Relative: 5 %
NEUTROS ABS: 9.5 10*3/uL — AB (ref 1.7–7.7)
Neutrophils Relative %: 77 %
PLATELETS: 203 10*3/uL (ref 150–400)
RBC: 4.39 MIL/uL (ref 3.87–5.11)
RDW: 13.7 % (ref 11.5–15.5)
WBC: 12.4 10*3/uL — AB (ref 4.0–10.5)

## 2017-10-21 MED ORDER — METOCLOPRAMIDE HCL 5 MG/ML IJ SOLN
10.0000 mg | Freq: Four times a day (QID) | INTRAMUSCULAR | Status: DC
  Administered 2017-10-21 – 2017-10-22 (×4): 10 mg via INTRAVENOUS
  Filled 2017-10-21 (×4): qty 2

## 2017-10-21 NOTE — Plan of Care (Signed)

## 2017-10-21 NOTE — Progress Notes (Signed)
Patient alert and oriented, Post op day 1.  Provided support and encouragement.  Encouraged pulmonary toilet, ambulation and small sips of liquids.  Working toward her 12 ounces of bari clear fluids.  Intermittent nausea this am.  All questions answered.  Will continue to monitor.

## 2017-10-21 NOTE — Progress Notes (Signed)
S: Some nausea. Low volume emesis. Zofran x 4 since or. No pain, has not required any pain meds. Walked 4x. BP running high.   Vitals, labs, intake/output, and orders reviewed at this time. No fever or tachycardia. Hypertensive. UOP 1500 + 1v, PO intake 240. CMP unremarkable. WBC 12.4 (11.2), hgb 12.1 (14.1)  Gen: A&Ox3, no distress  H&N: EOMI, atraumatic, neck supple Chest: unlabored respirations, RRR Abd: soft, nontender, nondistended, incisions c/d/i with steris/ bandaids Ext: warm, no edema Neuro: grossly normal  Lines/tubes/drains: PIV  A/P:  POD 1 lap sleeve. Overall doing well, some nausea. Continue clears and start protein shakes Continue ambulating/ pulmonary toilet HTN- home norvasc and metoprolol ordered; holding valsartan/hctz right now  Possible discharge this afternoon    Phylliss Blakeshelsea Tennie Grussing, MD [facility], [place] Pager [phone]

## 2017-10-21 NOTE — Progress Notes (Signed)
Intermittent nausea and emesis throughout the day.  Limited fluid intake, has tolerated fluid in last hour without nausea.  Ambulated frequently in hallway without difficulty.  No questions at this time.

## 2017-10-21 NOTE — Discharge Instructions (Signed)
° ° ° °GASTRIC BYPASS/SLEEVE ° Home Care Instructions ° ° These instructions are to help you care for yourself when you go home. ° °Call: If you have any problems. °• Call [phone] and ask for the surgeon on call °• If you need immediate help, come to the ER at [facility].  °• Tell the ER staff that you are a new post-op gastric bypass or gastric sleeve patient °  °Signs and symptoms to report: • Severe vomiting or nausea °o If you cannot keep down clear liquids for longer than 1 day, call your surgeon  °• Abdominal pain that does not get better after taking your pain medication °• Fever over 100.4° F with chills °• Heart beating over 100 beats a minute °• Shortness of breath at rest °• Chest pain °•  Redness, swelling, drainage, or foul odor at incision (surgical) sites °•  If your incisions open or pull apart °• Swelling or pain in calf (lower leg) °• Diarrhea (Loose bowel movements that happen often), frequent watery, uncontrolled bowel movements °• Constipation, (no bowel movements for 3 days) if this happens: Pick one °o Milk of Magnesia, 2 tablespoons by mouth, 3 times a day for 2 days if needed °o Stop taking Milk of Magnesia once you have a bowel movement °o Call your doctor if constipation continues °Or °o Miralax  (instead of Milk of Magnesia) following the label instructions °o Stop taking Miralax once you have a bowel movement °o Call your doctor if constipation continues °• Anything you think is not normal °  °Normal side effects after surgery: • Unable to sleep at night or unable to focus °• Irritability or moody °• Being tearful (crying) or depressed °These are common complaints, possibly related to your anesthesia medications that put you to sleep, stress of surgery, and change in lifestyle.  This usually goes away a few weeks after surgery.  If these feelings continue, call your primary care doctor. °  °Wound Care: You may have surgical glue, steri-strips, or staples over your incisions after  surgery °• Surgical glue:  Looks like a clear film over your incisions and will wear off a little at a time °• Steri-strips: Strips of tape over your incisions. You may notice a yellowish color on the skin under the steri-strips. This is used to make the   steri-strips stick better. Do not pull the steri-strips off - let them fall off °• Staples: Staples may be removed before you leave the hospital °o If you go home with staples, call [facility], [phone] at for an appointment with your surgeon’s nurse to have staples removed 10 days after surgery. °• Showering: You may shower two (2) days after your surgery unless your surgeon tells you differently °o Wash gently around incisions with warm soapy water, rinse well, and gently pat dry  °o No tub baths until staples are removed, steri-strips fall off or glue is gone.  °  °Medications: • Medications should be liquid or crushed if larger than the size of a dime °• Extended release pills (medication that release a little bit at a time through the day) should NOT be crushed or cut. (examples include XL, ER, DR, SR) °• Depending on the size and number of medications you take, you may need to space (take a few throughout the day)/change the time you take your medications so that you do not over-fill your pouch (smaller stomach) °• Make sure you follow-up with your primary care doctor to   make medication changes needed during rapid weight loss and life-style changes °• If you have diabetes, follow up with the doctor that orders your diabetes medication(s) within one week after surgery and check your blood sugar regularly. °• Do not drive while taking prescription pain medication  °• It is ok to take Tylenol by the bottle instructions with your pain medicine or instead of your pain medicine as needed.  DO NOT TAKE NSAIDS (EXAMPLES OF NSAIDS:  IBUPROFREN/ NAPROXEN)  °Diet:                    First 2 Weeks ° You will see the dietician t about two (2) weeks  after your surgery. The dietician will increase the types of foods you can eat if you are handling liquids well: °• If you have severe vomiting or nausea and cannot keep down clear liquids lasting longer than 1 day, call your surgeon @ ([phone]) °Protein Shake °• Drink at least 2 ounces of shake 5-6 times per day °• Each serving of protein shakes (usually 8 - 12 ounces) should have: °o 15 grams of protein  °o And no more than 5 grams of carbohydrate  °• Goal for protein each day: °o Men = 80 grams per day °o Women = 60 grams per day °• Protein powder may be added to fluids such as non-fat milk or Lactaid milk or unsweetened Soy/Almond milk (limit to 35 grams added protein powder per serving) ° °Hydration °• Slowly increase the amount of water and other clear liquids as tolerated (See Acceptable Fluids) °• Slowly increase the amount of protein shake as tolerated  °•  Sip fluids slowly and throughout the day.  Do not use straws. °• May use sugar substitutes in small amounts (no more than 6 - 8 packets per day; i.e. Splenda) ° °Fluid Goal °• The first goal is to drink at least 8 ounces of protein shake/drink per day (or as directed by the nutritionist); some examples of protein shakes are Syntrax Nectar, Adkins Advantage, EAS Edge HP, and Unjury. See handout from pre-op Bariatric Education Class: °o Slowly increase the amount of protein shake you drink as tolerated °o You may find it easier to slowly sip shakes throughout the day °o It is important to get your proteins in first °• Your fluid goal is to drink 64 - 100 ounces of fluid daily °o It may take a few weeks to build up to this °• 32 oz (or more) should be clear liquids  °And  °• 32 oz (or more) should be full liquids (see below for examples) °• Liquids should not contain sugar, caffeine, or carbonation ° °Clear Liquids: °• Water or Sugar-free flavored water (i.e. Fruit H2O, Propel) °• Decaffeinated coffee or tea (sugar-free) °• Crystal Lite, Wyler’s Lite,  Minute Maid Lite °• Sugar-free Jell-O °• Bouillon or broth °• Sugar-free Popsicle:   *Less than 20 calories each; Limit 1 per day ° °Full Liquids: °Protein Shakes/Drinks + 2 choices per day of other full liquids °• Full liquids must be: °o No More Than 15 grams of Carbs per serving  °o No More Than 3 grams of Fat per serving °• Strained low-fat cream soup (except Cream of Potato or Tomato) °• Non-Fat milk °• Fat-free Lactaid Milk °• Unsweetened Soy Or Unsweetened Almond Milk °• Low Sugar yogurt (Dannon Lite & Fit, Greek yogurt; Oikos Triple Zero; Chobani Simply 100; Yoplait 100 calorie Greek - No Fruit on the Bottom) ° °  °Vitamins   and Minerals • Start 1 day after surgery unless otherwise directed by your surgeon °• 2 Chewable Bariatric Specific Multivitamin / Multimineral Supplement with iron (Example: Bariatric Advantage Multi EA) °• Chewable Calcium with Vitamin D-3 °(Example: 3 Chewable Calcium Plus 600 with Vitamin D-3) °o Take 500 mg three (3) times a day for a total of 1500 mg each day °o Do not take all 3 doses of calcium at one time as it may cause constipation, and you can only absorb 500 mg  at a time  °o Do not mix multivitamins containing iron with calcium supplements; take 2 hours apart °• Menstruating women and those with a history of anemia (a blood disease that causes weakness) may need extra iron °o Talk with your doctor to see if you need more iron °• Do not stop taking or change any vitamins or minerals until you talk to your dietitian or surgeon °• Your Dietitian and/or surgeon must approve all vitamin and mineral supplements °  °Activity and Exercise: Limit your physical activity as instructed by your doctor.  It is important to continue walking at home.  During this time, use these guidelines: °• Do not lift anything greater than ten (10) pounds for at least two (2) weeks °• Do not go back to work or drive until your surgeon says you can °• You may have sex when you feel comfortable  °o It is  VERY important for female patients to use a reliable birth control method; fertility often increases after surgery  °o All hormonal birth control will be ineffective for 30 days after surgery due to medications given during surgery a barrier method must be used. °o Do not get pregnant for at least 18 months °• Start exercising as soon as your doctor tells you that you can °o Make sure your doctor approves any physical activity °• Start with a simple walking program °• Walk 5-15 minutes each day, 7 days per week.  °• Slowly increase until you are walking 30-45 minutes per day °Consider joining our BELT program. [phone] or email [email or web address] °  °Special Instructions Things to remember: °• Use your CPAP when sleeping if this applies to you ° °• [facility] has two free Bariatric Surgery Support Groups that meet monthly °o The 3rd Thursday of each month, 6 pm, [facility]  °o The 2nd Friday of each month, 11:45 am in the private dining room in the basement of [facility] °• It is very important to keep all follow up appointments with your surgeon, dietitian, primary care physician, and behavioral health practitioner °• Routine follow up schedule with your surgeon include appointments at 2-3 weeks, 6-8 weeks, 6 months, and 1 year at a minimum.  Your surgeon may request to see you more often.   °o After the first year, please follow up with your bariatric surgeon and dietitian at least once a year in order to maintain best weight loss results °[facility]: [phone] °[facility]: [phone] °Bariatric Nurse Coordinator: [phone] °  °   Reviewed and Endorsed  °by [place] Patient Education Committee, June, 2016 °Edits Approved: Aug, 2018 ° ° ° °

## 2017-10-22 LAB — CBC WITH DIFFERENTIAL/PLATELET
Basophils Absolute: 0 10*3/uL (ref 0.0–0.1)
Basophils Relative: 0 %
EOS PCT: 0 %
Eosinophils Absolute: 0 10*3/uL (ref 0.0–0.7)
HCT: 35.7 % — ABNORMAL LOW (ref 36.0–46.0)
Hemoglobin: 11.7 g/dL — ABNORMAL LOW (ref 12.0–15.0)
LYMPHS ABS: 2.9 10*3/uL (ref 0.7–4.0)
LYMPHS PCT: 26 %
MCH: 27.5 pg (ref 26.0–34.0)
MCHC: 32.8 g/dL (ref 30.0–36.0)
MCV: 83.8 fL (ref 78.0–100.0)
MONO ABS: 0.6 10*3/uL (ref 0.1–1.0)
MONOS PCT: 5 %
Neutro Abs: 8 10*3/uL — ABNORMAL HIGH (ref 1.7–7.7)
Neutrophils Relative %: 69 %
PLATELETS: 191 10*3/uL (ref 150–400)
RBC: 4.26 MIL/uL (ref 3.87–5.11)
RDW: 14 % (ref 11.5–15.5)
WBC: 11.5 10*3/uL — ABNORMAL HIGH (ref 4.0–10.5)

## 2017-10-22 NOTE — Discharge Summary (Signed)
Physician Discharge Summary  Chelsea [name] TDD:[id number] DOB: [birth year] DOA: 10/20/2017  PCP: Silverio Decamp, MD  Admit date: 10/20/2017 Discharge date:  10/22/2017   Recommendations for Outpatient Follow-up:   Follow-up Information    Clovis Riley, MD. Go on 11/13/2017.   Specialty:  General Surgery Why:  at 1140 Contact information: [address] [place] [address] [phone]        Clovis Riley, MD .   Specialty:  General Surgery Contact information: [address] [place] [address] [phone]          Discharge Diagnoses:  Active Problems:   OBESITY, CLASS III   Surgical Procedure: Laparoscopic Sleeve Gastrectomy, upper endoscopy  Discharge Condition: Good Disposition: Home  Diet recommendation: Postoperative sleeve gastrectomy diet (liquids only)  Filed Weights   10/20/17 0609 10/21/17 0613 10/22/17 0721  Weight: (!) 155.1 kg (!) 161.6 kg (!) 163.9 kg     Hospital Course:  The patient was admitted for a planned laparoscopic sleeve gastrectomy. Please see operative note. Preoperatively the patient was given lovenox for DVT prophylaxis. Postoperative prophylactic Lovenox dosing was started on the evening of postoperative day 0. ERAS protocol was used. On the evening of postoperative day 0, the patient was started on water and ice chips. On postoperative day 1 the patient had no fever or tachycardia and was tolerating water in their diet was gradually advanced throughout the day. The patient was ambulating without difficulty. Their vital signs are stable without fever or tachycardia. Their hemoglobin had remained stable. She had some nausea and emesis which improved with addition of reglan. On post-op day two her nausea was notably better. The patient had received discharge instructions and counseling. They were deemed stable for discharge and had met discharge criteria   Discharge  Instructions  Discharge Instructions    Ambulate hourly while awake   Complete by:  As directed    Call MD for:  difficulty breathing, headache or visual disturbances   Complete by:  As directed    Call MD for:  persistant dizziness or light-headedness   Complete by:  As directed    Call MD for:  persistant nausea and vomiting   Complete by:  As directed    Call MD for:  redness, tenderness, or signs of infection (pain, swelling, redness, odor or green/yellow discharge around incision site)   Complete by:  As directed    Call MD for:  severe uncontrolled pain   Complete by:  As directed    Call MD for:  temperature >101 F   Complete by:  As directed    Diet bariatric full liquid   Complete by:  As directed    Incentive spirometry   Complete by:  As directed    Perform hourly while awake     Allergies as of 10/22/2017      Reactions   Amoxicillin Other (See Comments)   Caused yeast infection Has patient had a PCN reaction causing immediate rash, facial/tongue/throat swelling, SOB or lightheadedness with hypotension: Unknown Has patient had a PCN reaction causing severe rash involving mucus membranes or skin necrosis: No Has patient had a PCN reaction that required hospitalization: No Has patient had a PCN reaction occurring within the last 10 years: No Childhood allergy If all of the above answers are "NO", then may proceed with Cephalosporin use.   Ceclor [cefaclor] Other (See Comments)   Caused yeast infection   Enalapril Maleate Swelling  lip swelling   Erythromycin Diarrhea   Penicillins Other (See Comments)   Caused yeast infection Has patient had a PCN reaction causing immediate rash, facial/tongue/throat swelling, SOB or lightheadedness with hypotension: Unknown Has patient had a PCN reaction causing severe rash involving mucus membranes or skin necrosis: No Has patient had a PCN reaction that required hospitalization: No Has patient had a PCN reaction occurring  within the last 10 years: No Childhood allergy If all of the above answers are "NO", then may proceed with Cephalosporin use.   Sulfa Antibiotics Other (See Comments)   Unsure of reaction type      Medication List    STOP taking these medications   meloxicam 15 MG tablet Commonly known as:  MOBIC     TAKE these medications   amLODipine 10 MG tablet Commonly known as:  NORVASC TAKE 1 TABLET BY MOUTH EVERY DAY Notes to patient:  Monitor Blood Pressure Daily and keep a log for primary care physician.  You may need to make changes to your medications with rapid weight loss.     DULoxetine 60 MG capsule Commonly known as:  CYMBALTA Take 2 capsules (120 mg total) by mouth daily. What changed:    how much to take  when to take this   metoprolol tartrate 50 MG tablet Commonly known as:  LOPRESSOR Take 1 tablet (50 mg total) by mouth 2 (two) times daily. What changed:  when to take this Notes to patient:  Monitor Blood Pressure Daily and keep a log for primary care physician.  You may need to make changes to your medications with rapid weight loss.     ondansetron 4 MG disintegrating tablet Commonly known as:  ZOFRAN-ODT Take 4 mg by mouth every 8 (eight) hours as needed for nausea/vomiting.   oxyCODONE 5 MG/5ML solution Commonly known as:  ROXICODONE Take 5-10 mLs by mouth every 4 (four) hours as needed for pain.   pantoprazole 40 MG tablet Commonly known as:  PROTONIX Take 40 mg by mouth daily.   rizatriptan 10 MG tablet Commonly known as:  MAXALT Take 1 tablet (10 mg total) by mouth as needed for migraine. May repeat in 2 hours if needed   triamcinolone 0.1 % paste Commonly known as:  KENALOG Use as directed 1 application in the mouth or throat 2 (two) times daily. What changed:    when to take this  reasons to take this   valACYclovir 1000 MG tablet Commonly known as:  VALTREX Take 1 tablet (1,000 mg total) by mouth 2 (two) times daily. What changed:     when to take this  reasons to take this   valsartan-hydrochlorothiazide 320-25 MG tablet Commonly known as:  DIOVAN-HCT Take 1 tablet by mouth daily. Notes to patient:  OK to NOT take this medication for the next few days. Monitor your blood pressure at home and bring record to PCP next week to see if some BP meds can be stopped.       Follow-up Information    Clovis Riley, MD. Go on 11/13/2017.   Specialty:  General Surgery Why:  at 1140 Contact information: [address] [place] [address] [phone]        Clovis Riley, MD .   Specialty:  General Surgery Contact information: [address] [place] [address] [phone]            The results of significant diagnostics from this hospitalization (including imaging,  microbiology, ancillary and laboratory) are listed below for reference.    Significant Diagnostic Studies: No results found.  Labs: Basic Metabolic Panel: Recent Labs  Lab 10/15/17 1430 10/21/17 0532  NA 144 143  K 4.8 3.5  CL 103 108  CO2 31 27  GLUCOSE 125* 127*  BUN 14 10  CREATININE 0.86 0.86  CALCIUM 9.5 8.4*   Liver Function Tests: Recent Labs  Lab 10/15/17 1430 10/21/17 0532  AST 31 24  ALT 36 32  ALKPHOS 94 76  BILITOT 0.5 0.4  PROT 7.6 6.7  ALBUMIN 3.9 3.4*    CBC: Recent Labs  Lab 10/15/17 1430 10/21/17 0532 10/22/17 0532  WBC 11.2* 12.4* 11.5*  NEUTROABS 7.5 9.5* 8.0*  HGB 14.1 12.1 11.7*  HCT 42.3 36.5 35.7*  MCV 83.6 83.1 83.8  PLT 248 203 191    CBG: No results for input(s): GLUCAP in the last 168 hours.  Active Problems:   OBESITY, CLASS III   Signed:  [facility], [place] [phone] 10/22/2017, 7:47 AM

## 2017-10-22 NOTE — Progress Notes (Signed)
Discharge instructions discussed with patient and family verbalized agreed and understanding

## 2017-10-22 NOTE — Progress Notes (Signed)
Patient alert and oriented, pain is controlled. Patient is tolerating fluids, advanced to protein shake today, patient is tolerating well.  Reviewed Gastric sleeve discharge instructions with patient and patient is able to articulate understanding.  Provided information on BELT program, Support Group and [facility] outpatient pharmacy. All questions answered, will continue to monitor.   Total fluid intake 1000 Per dehydration protocol call back 10-28-17

## 2017-10-28 ENCOUNTER — Telehealth (HOSPITAL_COMMUNITY): Payer: Self-pay

## 2017-10-28 NOTE — Telephone Encounter (Addendum)
Left voicemail to discuss post bariatric surgery follow up questions below.  Await return call   1.  Tell me about your pain and pain management?pain or soreness with movement, pain medicaiton effective  2.  Let's talk about fluid intake.  How much total fluid are you taking in?55  + fluids  3.  How much protein have you taken in the last 2 days?45 grams  4.  Have you had nausea?  Tell me about when have experienced nausea and what you did to help?nausea with mvi once,   5.  Has the frequency or color changed with your urine?regularly straw color   6.  Tell me what your incisions look like?buised but ok  7.  Have you been passing gas? BM?bm  Since home  8.  If a problem or question were to arise who would you call?  Do you know contact numbers for [facility], [facility], and [facility]?aware of all services  9.  How has the walking going?walking often  10.  How are your vitamins and calcium going?  How are you taking them?mvi and calcium taking regularly, got sick once with [facility]

## 2017-10-29 ENCOUNTER — Encounter: Payer: Self-pay | Admitting: Sports Medicine

## 2017-10-29 MED ORDER — VALSARTAN 320 MG PO TABS: 320.0000 mg | ORAL_TABLET | Freq: Every day | ORAL | 3 refills | Status: DC

## 2017-11-03 ENCOUNTER — Other Ambulatory Visit: Payer: Self-pay | Admitting: Sports Medicine

## 2017-11-03 DIAGNOSIS — I10 Essential (primary) hypertension: Secondary | ICD-10-CM

## 2017-11-04 ENCOUNTER — Encounter: Payer: BLUE CROSS/BLUE SHIELD | Attending: Surgery | Admitting: Registered"

## 2017-11-04 DIAGNOSIS — Z713 Dietary counseling and surveillance: Secondary | ICD-10-CM | POA: Insufficient documentation

## 2017-11-04 DIAGNOSIS — E119 Type 2 diabetes mellitus without complications: Secondary | ICD-10-CM

## 2017-11-05 NOTE — Progress Notes (Signed)
Bariatric Class:  Appt start time: 1530 end time:  1630.  2 Week Post-Operative Nutrition Class  Patient was seen on 11/04/2017 for Post[facility]ative [facility]   Surgery date: 10/20/2017 Surgery type: Sleeve Start weight at [facility]: 348.6 Weight today: 318.2 Weight change: 30.4 lbs loss  Pt states she is consuming 55-64 oz of fluid and meeting protein goals. Pt states she is not longer taking HCTZ. Pt states she experiences some gas.   TANITA  BODY COMP RESULTS  11/04/2017   BMI (kg/m^2) 47.7   Fat Mass (lbs) 175.0   Fat Free Mass (lbs) 143.2   Total Body Water (lbs) 108.0   The following the learning objectives were met by the patient during this course:  Identifies Phase 3A (Soft, High Proteins) Dietary Goals and will begin from 2 weeks post-operatively to 2 months post-operatively  Identifies appropriate sources of fluids and proteins   States protein recommendations and appropriate sources post-operatively  Identifies the need for appropriate texture modifications, mastication, and bite sizes when consuming solids  Identifies appropriate multivitamin and calcium sources post-operatively  Describes the need for physical activity post-operatively and will follow MD recommendations  States when to call healthcare provider regarding medication questions or post-operative complications  Handouts given during class include:  Phase 3A: Soft, High Protein Diet Handout  Follow-Up Plan: Patient will follow-up at [facility] in 6 weeks for 2 month post-op nutrition visit for diet advancement per MD.

## 2017-11-07 ENCOUNTER — Encounter: Payer: Self-pay | Admitting: Sports Medicine

## 2017-11-07 ENCOUNTER — Telehealth: Payer: BLUE CROSS/BLUE SHIELD | Admitting: Family Medicine

## 2017-11-07 DIAGNOSIS — N390 Urinary tract infection, site not specified: Secondary | ICD-10-CM

## 2017-11-07 MED ORDER — NITROFURANTOIN MONOHYD MACRO 100 MG PO CAPS: 100.0000 mg | ORAL_CAPSULE | Freq: Two times a day (BID) | ORAL | 0 refills | Status: AC

## 2017-11-07 NOTE — Progress Notes (Signed)

## 2017-11-12 ENCOUNTER — Telehealth: Payer: Self-pay | Admitting: Skilled Nursing Facility1

## 2017-11-12 NOTE — Telephone Encounter (Signed)
Dietitian called pt to assess her advancement of liquid to solid.  LVM

## 2017-11-28 ENCOUNTER — Encounter: Payer: Self-pay | Admitting: Sports Medicine

## 2017-11-28 DIAGNOSIS — R300 Dysuria: Secondary | ICD-10-CM | POA: Diagnosis not present

## 2017-11-28 NOTE — Assessment & Plan Note (Signed)
History of dysuria, treated a couple of weeks ago in on electronic visit with Macrobid empirically. Recurrence of symptoms, we really do need a urinalysis and urine culture for antibiotic selection.

## 2017-11-29 MED ORDER — CEPHALEXIN 500 MG PO CAPS: 500.0000 mg | ORAL_CAPSULE | Freq: Two times a day (BID) | ORAL | 0 refills | Status: DC

## 2017-11-29 MED ORDER — PHENAZOPYRIDINE HCL 200 MG PO TABS: 200.0000 mg | ORAL_TABLET | Freq: Three times a day (TID) | ORAL | 0 refills | Status: AC

## 2017-11-29 MED ORDER — FLUCONAZOLE 150 MG PO TABS: 150.0000 mg | ORAL_TABLET | Freq: Once | ORAL | 0 refills | Status: AC

## 2017-11-29 NOTE — Addendum Note (Signed)
Addended by: Monica Becton on: 11/29/2017 11:32 AM   Modules accepted: Orders

## 2017-11-30 LAB — URINALYSIS W MICROSCOPIC + REFLEX CULTURE
Bilirubin Urine: NEGATIVE
Glucose, UA: NEGATIVE
Hyaline Cast: NONE SEEN /LPF
Ketones, ur: NEGATIVE
Nitrites, Initial: NEGATIVE
Specific Gravity, Urine: 1.023 (ref 1.001–1.03)
pH: 5.5 (ref 5.0–8.0)

## 2017-11-30 LAB — CULTURE INDICATED

## 2017-11-30 LAB — URINE CULTURE
MICRO NUMBER:: 91200252
SPECIMEN QUALITY:: ADEQUATE

## 2017-12-18 ENCOUNTER — Encounter: Payer: BLUE CROSS/BLUE SHIELD | Attending: Surgery | Admitting: Skilled Nursing Facility1

## 2017-12-18 ENCOUNTER — Encounter: Payer: Self-pay | Admitting: Skilled Nursing Facility1

## 2017-12-18 DIAGNOSIS — Z713 Dietary counseling and surveillance: Secondary | ICD-10-CM | POA: Diagnosis not present

## 2017-12-18 DIAGNOSIS — E669 Obesity, unspecified: Secondary | ICD-10-CM

## 2017-12-18 NOTE — Progress Notes (Signed)
Follow-up visit:  8 Weeks Post-Operative Sleeve Surgery  Primary concerns today: Post-operative Bariatric Surgery Nutrition Management.  Pt states she was keeping a food log but then her phone broke. Pt states she has vomited twice due to overeating. Pt states in the hospital she continue to vomit due to anaesthesia. Pt states she has been drinking while she is eating and eating too fast. Pt states she is trying to eat slower using her mom as a model. Pt has had several occurences of eating too much. Pt was educated on the fat content of vienna sausages. Pt was educated on the BELT program and how to implement it into her life pt was hesitant due to the time.  Pt is resistant to making changes stating her barrier is she never drank water before surgery and she has always eaten quickly.  Pt was educated on the improper ratio of fat mass to muscle mass loss.   Surgery date: 10/20/2017 Surgery type: Sleeve Start weight at [facility]: 348.6 Weight today: 308.8 Weight change: 9.4  TANITA  BODY COMP RESULTS  11/04/2017 12/18/2017   BMI (kg/m^2) 47.7 47   Fat Mass (lbs) 175.0 169.2   Fat Free Mass (lbs) 143.2 139.6   Total Body Water (lbs) 108.0 105    24-hr recall: B (AM): greek yogurt  Snk (AM):  L (PM): [place] and cheese roll up Snk (PM): 5 vienna sausages  D (PM): chicken or fish or canned tuna or steak Snk (PM): popcicle   Fluid intake: g2 gatorade, 1% milk: 16 ounces, water:  Estimated total protein intake: 80+  Medications: see list  Supplementation: Opurity and calcium  Using straws: no Drinking while eating: yes Having you been chewing well: no Chewing/swallowing difficulties: no Changes in vision: no Changes to mood/headaches: no Hair loss/Cahnges to skin/Changes to nails: no Any difficulty focusing or concentrating: no Sweating: no Dizziness/Lightheaded: no Palpitations: no  Carbonated beverages: no N/V/D/C/GAS: no Abdominal Pain: no Dumping syndrome: no  Recent  physical activity:  Walking 2 days a week 15-30 minutes   Progress Towards Goal(s):  In progress.  Handouts given during visit include:  Non starchy veggies + protein    Nutritional Diagnosis:  [place]-3.3 Overweight/obesity related to past poor dietary habits and physical inactivity as evidenced by patient w/ recent sleeve surgery following dietary guidelines for continued weight loss.    Intervention:  Nutrition counseling. Pts diet was advanced to the next phase now including non starchy veggies. Due to the bodies need for essential vitamins, minerals, and fats the pt was educated on the need to consume a certain amount of calories as well as certain nutrients daily. Pt was educated on the need for daily physical activity and to reach a goal of at least 150 minutes of moderate to vigorous physical activity as directed by their physician due to such benefits as increased musculature and improved lab values.   Goals: -Take Opurity capsule  -Continue to aim for a minimum of 64 fluid ounces 7 days a week with at least 30 ounces being plain water -Eat non-starchy vegetables 2 times a day 7 days a week -Start out with soft cooked vegetables today and tomorrow; if tolerated begin to eat raw vegetables or cooked including salads -Eat your 3 ounces of protein first then start in on your non-starchy vegetables; once you understand how much of your meal leads to satisfaction and not full while still eating 3 ounces of protein and non-starchy vegetables you can eat them in any order  -  Continue to aim for 30 minutes of activity at least 5 times a week -Continue to work on identifying when you are satisfied verses full -dilute your Gatorade half and half aiming for 4 total bottles  Teaching Method Utilized:  Visual Auditory Hands on  Barriers to learning/adherence to lifestyle change: rushing through meals  Demonstrated degree of understanding via:  Teach Back   Monitoring/Evaluation:  Dietary intake,  exercise, and body weight.

## 2017-12-18 NOTE — Patient Instructions (Addendum)
-  Take Opurity capsule   -Continue to aim for a minimum of 64 fluid ounces 7 days a week with at least 30 ounces being plain water  -Eat non-starchy vegetables 2 times a day 7 days a week  -Start out with soft cooked vegetables today and tomorrow; if tolerated begin to eat raw vegetables or cooked including salads  -Eat your 3 ounces of protein first then start in on your non-starchy vegetables; once you understand how much of your meal leads to satisfaction and not full while still eating 3 ounces of protein and non-starchy vegetables you can eat them in any order   -Continue to aim for 30 minutes of activity at least 5 times a week  -Continue to work on identifying when you are satisfied verses full  -dilute your Gatorade half and half aiming for 4 total bottles

## 2018-01-02 ENCOUNTER — Encounter: Payer: Self-pay | Admitting: Physician Assistant

## 2018-01-02 ENCOUNTER — Ambulatory Visit (INDEPENDENT_AMBULATORY_CARE_PROVIDER_SITE_OTHER): Payer: BLUE CROSS/BLUE SHIELD | Admitting: Physician Assistant

## 2018-01-02 VITALS — BP 108/77 | HR 62 | Temp 97.8°F | Wt 305.0 lb

## 2018-01-02 DIAGNOSIS — N898 Other specified noninflammatory disorders of vagina: Secondary | ICD-10-CM | POA: Diagnosis not present

## 2018-01-02 DIAGNOSIS — R300 Dysuria: Secondary | ICD-10-CM

## 2018-01-02 DIAGNOSIS — R82998 Other abnormal findings in urine: Secondary | ICD-10-CM | POA: Diagnosis not present

## 2018-01-02 LAB — POCT URINALYSIS DIPSTICK
BILIRUBIN UA: NEGATIVE
GLUCOSE UA: NEGATIVE
KETONES UA: NEGATIVE
NITRITE UA: NEGATIVE
PROTEIN UA: NEGATIVE
SPEC GRAV UA: 1.015 (ref 1.010–1.025)
Urobilinogen, UA: 0.2 E.U./dL
pH, UA: 7 (ref 5.0–8.0)

## 2018-01-02 MED ORDER — CEFUROXIME AXETIL 250 MG PO TABS: 250.0000 mg | ORAL_TABLET | Freq: Two times a day (BID) | ORAL | 0 refills | Status: AC

## 2018-01-02 MED ORDER — FLUCONAZOLE 150 MG PO TABS: 150.0000 mg | ORAL_TABLET | Freq: Once | ORAL | 0 refills | Status: AC

## 2018-01-02 NOTE — Progress Notes (Signed)
HPI:                                                                Chelsea [name] is a 32 y.o. female who presents to [facility]: Primary Care Sports Medicine today for dysuria  Dysuria   This is a new problem. The current episode started yesterday. The problem occurs every urination. The problem has been unchanged. The quality of the pain is described as burning. There has been no fever. She is sexually active. There is a history of pyelonephritis. Associated symptoms include a discharge, frequency and urgency. Pertinent negatives include no hematuria or possible pregnancy. Her past medical history is significant for recurrent UTIs.     Past Medical History:  Diagnosis Date  . Depression   . Endometrial polyp   . Fibromyalgia   . History of recurrent UTIs   . Hypertension   . Infertility, anovulation   . Myofascial muscle pain   . OA (osteoarthritis) of knee    BILATERAL  . PCOS (polycystic ovarian syndrome)   . PONV (postoperative nausea and vomiting)   . Seasonal allergic rhinitis    Past Surgical History:  Procedure Laterality Date  . HYSTEROSCOPY W/D&C N/A 09/07/2014   Procedure:  DILATATION AND CURETTAGE /HYSTEROSCOPY/POLYPECTOMY;  Surgeon: Fermin Schwab, MD;  Location: [facility] [place];  Service: Gynecology;  Laterality: N/A;  . I & D RIGHT FOOT  1998  . LAPAROSCOPIC GASTRIC SLEEVE RESECTION N/A 10/20/2017   Procedure: LAPAROSCOPIC GASTRIC SLEEVE RESECTION, UPPER ENDO, ERAS Pathway;  Surgeon: Berna Bue, MD;  Location: [facility];  Service: General;  Laterality: N/A;  . POLYPECTOMY     uterine  . TONSILLECTOMY  2001  . WISDOM TOOTH EXTRACTION  2004   Social History   Tobacco Use  . Smoking status: Never Smoker  . Smokeless tobacco: Never Used  Substance Use Topics  . Alcohol use: No    Alcohol/week: 0.0 standard drinks   family history includes Cancer in her paternal grandfather; Diabetes in her father and mother; Fibromyalgia  in her cousin and maternal aunt; Glaucoma in her paternal grandmother; Hypertension in her father; Polycystic ovary syndrome in her mother.    ROS: negative except as noted in the HPI  Medications: Current Outpatient Medications  Medication Sig Dispense Refill  . amLODipine (NORVASC) 10 MG tablet TAKE 1 TABLET BY MOUTH EVERY DAY 90 tablet 0  . cephALEXin (KEFLEX) 500 MG capsule Take 1 capsule (500 mg total) by mouth 2 (two) times daily. 14 capsule 0  . DULoxetine (CYMBALTA) 60 MG capsule Take 2 capsules (120 mg total) by mouth daily. (Patient taking differently: Take 60 mg by mouth 2 (two) times daily. ) 180 capsule 1  . metoprolol tartrate (LOPRESSOR) 50 MG tablet Take 1 tablet (50 mg total) by mouth 2 (two) times daily. (Patient taking differently: Take 50 mg by mouth daily. ) 180 tablet 1  . ondansetron (ZOFRAN-ODT) 4 MG disintegrating tablet Take 4 mg by mouth every 8 (eight) hours as needed for nausea/vomiting.  0  . oxyCODONE (ROXICODONE) 5 MG/5ML solution Take 5-10 mLs by mouth every 4 (four) hours as needed for pain.  0  . pantoprazole (PROTONIX) 40 MG tablet Take 40 mg by mouth daily.  3  .  rizatriptan (MAXALT) 10 MG tablet Take 1 tablet (10 mg total) by mouth as needed for migraine. May repeat in 2 hours if needed 90 tablet 0  . triamcinolone (KENALOG) 0.1 % paste Use as directed 1 application in the mouth or throat 2 (two) times daily. (Patient taking differently: Use as directed 1 application in the mouth or throat 2 (two) times daily as needed (for ulcers.). ) 5 g 11  . valACYclovir (VALTREX) 1000 MG tablet Take 1 tablet (1,000 mg total) by mouth 2 (two) times daily. (Patient taking differently: Take 1,000 mg by mouth 2 (two) times daily as needed (for cold sores.). ) 20 tablet 0  . valsartan (DIOVAN) 320 MG tablet Take 1 tablet (320 mg total) by mouth daily. 30 tablet 3  . valsartan-hydrochlorothiazide (DIOVAN-HCT) 320-25 MG tablet TAKE 1 TABLET BY MOUTH EVERY DAY (Patient not  taking: Reported on 11/05/2017) 90 tablet 1   No current facility-administered medications for this visit.    Allergies  Allergen Reactions  . Amoxicillin Other (See Comments)    Caused yeast infection Has patient had a PCN reaction causing immediate rash, facial/tongue/throat swelling, SOB or lightheadedness with hypotension: Unknown Has patient had a PCN reaction causing severe rash involving mucus membranes or skin necrosis: No Has patient had a PCN reaction that required hospitalization: No Has patient had a PCN reaction occurring within the last 10 years: No Childhood allergy If all of the above answers are "NO", then may proceed with Cephalosporin use.   Corky Sox [Cefaclor] Other (See Comments)    Caused yeast infection  . Enalapril Maleate Swelling    lip swelling  . Erythromycin Diarrhea  . Penicillins Other (See Comments)    Caused yeast infection Has patient had a PCN reaction causing immediate rash, facial/tongue/throat swelling, SOB or lightheadedness with hypotension: Unknown Has patient had a PCN reaction causing severe rash involving mucus membranes or skin necrosis: No Has patient had a PCN reaction that required hospitalization: No Has patient had a PCN reaction occurring within the last 10 years: No Childhood allergy If all of the above answers are "NO", then may proceed with Cephalosporin use.    . Sulfa Antibiotics Other (See Comments)    Unsure of reaction type       Objective:  BP 108/77   Pulse 62   Temp 97.8 F (36.6 C) (Oral)   Wt (!) 305 lb (138.3 kg)   BMI 46.38 kg/m  Gen:  alert, not ill-appearing, no distress, appropriate for age HEENT: head normocephalic without obvious abnormality, conjunctiva and cornea clear, trachea midline Pulm: Normal work of breathing, normal phonation GI: no CVA tenderness Neuro: alert and oriented x 3, no tremor MSK: extremities atraumatic, normal gait and station Skin: intact, no rashes on exposed skin, no  jaundice, no cyanosis   Results for orders placed or performed in visit on 01/02/18 (from the past 72 hour(s))  POCT Urinalysis Dipstick     Status: Abnormal   Collection Time: 01/02/18  4:21 PM  Result Value Ref Range   Color, UA yellow    Clarity, UA cloudy    Glucose, UA Negative Negative   Bilirubin, UA negative    Ketones, UA negative    Spec Grav, UA 1.015 1.010 - 1.025   Blood, UA trace    pH, UA 7.0 5.0 - 8.0   Protein, UA Negative Negative   Urobilinogen, UA 0.2 0.2 or 1.0 E.U./dL   Nitrite, UA negative    Leukocytes, UA Moderate (2+) (  A) Negative   Appearance     Odor     No results found.    Assessment and Plan: 32 y.o. female with   .Iyana was seen today for dysuria.  Diagnoses and all orders for this visit:  Dysuria -     POCT Urinalysis Dipstick -     Urine Culture -     cefUROXime (CEFTIN) 250 MG tablet; Take 1 tablet (250 mg total) by mouth 2 (two) times daily with a meal for 7 days.  Vaginal pruritus -     fluconazole (DIFLUCAN) 150 MG tablet; Take 1 tablet (150 mg total) by mouth once for 1 dose. May repeat in 72 hours if symptoms persist or recur  UA positive for moderate leuks and trace blood No symptoms or signs to suggest pyelonephritis Treating empirically for uncomplicated cystitis with Ceftin Urine culture pending     Patient education and anticipatory guidance given Patient agrees with treatment plan Follow-up as needed if symptoms worsen or fail to improve  Levonne Hubert PA-C

## 2018-01-02 NOTE — Patient Instructions (Signed)

## 2018-01-05 ENCOUNTER — Other Ambulatory Visit: Payer: Self-pay | Admitting: Physician Assistant

## 2018-01-05 LAB — URINE CULTURE
MICRO NUMBER:: 91348804
SPECIMEN QUALITY:: ADEQUATE

## 2018-01-23 ENCOUNTER — Other Ambulatory Visit: Payer: Self-pay | Admitting: Sports Medicine

## 2018-01-27 ENCOUNTER — Encounter: Payer: Self-pay | Admitting: Sports Medicine

## 2018-01-30 ENCOUNTER — Other Ambulatory Visit: Payer: Self-pay | Admitting: Sports Medicine

## 2018-01-30 DIAGNOSIS — I10 Essential (primary) hypertension: Secondary | ICD-10-CM

## 2018-02-04 ENCOUNTER — Encounter: Payer: Self-pay | Admitting: Skilled Nursing Facility1

## 2018-02-04 ENCOUNTER — Encounter: Payer: BLUE CROSS/BLUE SHIELD | Attending: Surgery | Admitting: Skilled Nursing Facility1

## 2018-02-04 DIAGNOSIS — E119 Type 2 diabetes mellitus without complications: Secondary | ICD-10-CM

## 2018-02-04 DIAGNOSIS — Z713 Dietary counseling and surveillance: Secondary | ICD-10-CM | POA: Insufficient documentation

## 2018-02-04 NOTE — Patient Instructions (Addendum)
-  Aim for 64 fluid ounces (gatorade zero, unsweet tea, sugar free drinks, etc.) a day   -Use Bitesnap App to log your food and beverage; you may need to use measuring cups   -Use your palm to measure about how much steak you are eating when you go out to eat  -Aim to use your sit up board or other machine 30 minutes 2 days a week  -Continue to eat your non starchy vegetables   -Try using your nieces plate to eat off of

## 2018-02-04 NOTE — Progress Notes (Signed)
Follow-up visit: Post-Operative Sleeve Surgery  Primary concerns today: Post-operative Bariatric Surgery Nutrition Management.  Pt states she is still struggling to drink water. Pt states she ordered a sit up to board to help her with sit ups.  Pt states she did have pumpkin pie for thanksgiving. Pt states she is doing better with indentying when she is satisfied verses full.  Pt continues to struggle with lifestyle changes nad eating appropriatly. Pt wanted expressed wanting to add in bread and other carbohydrates: Dietitian advsied she understand her relationship with food and meeting her protein/fluid needs before she adds those items in.  Pt only reported eating foods on the current phase but this report is not reflective of how the pt is truly eating.  Surgery date: 10/20/2017 Surgery type: Sleeve Start weight at [facility]: 348.6 Weight today: 303 Weight change: 5.8  TANITA  BODY COMP RESULTS  11/04/2017 12/18/2017 02/04/2018   BMI (kg/m^2) 47.7 47 44.7   Fat Mass (lbs) 175.0 169.2 155.8   Fat Free Mass (lbs) 143.2 139.6 147.2   Total Body Water (lbs) 108.0 105 110.2    24-hr recall: eating 4-5 times a day B (AM): 2 eggs with crisco shortening with cheese and milk Snk (AM): 1.5 pieces cheese L (PM): chicken and steamed vegetables  Snk (PM): 5 vienna sausages and lunch [place] with honey mustard  D (PM): hamburger steak with onion or grilled chicken or fish and steamed vegetables Snk (PM): nuts and ham and sunflower seed   Fluid intake: g2 gatorade, 1% milk, unsweet tea water: 48oz  Estimated total protein intake: 80+  Medications: see list  Supplementation: Opurity and calcium  Using straws: no Drinking while eating: yes Having you been chewing well: no Chewing/swallowing difficulties: no Changes in vision: no Changes to mood/headaches: no Hair loss/Cahnges to skin/Changes to nails: no Any difficulty focusing or concentrating: no Sweating: no Dizziness/Lightheaded:  no Palpitations: no  Carbonated beverages: no N/V/D/C/GAS: no Abdominal Pain: no Dumping syndrome: no  Recent physical activity:  Walking 2 days a week around the house (inconsistent)  Progress Towards Goal(s):  In progress.  Handouts given during visit include:  Non starchy veggies + protein    Nutritional Diagnosis:  [place]-3.3 Overweight/obesity related to past poor dietary habits and physical inactivity as evidenced by patient w/ recent sleeve surgery following dietary guidelines for continued weight loss.    Intervention:  Nutrition counseling. Pts diet was advanced to the next phase now including non starchy veggies. Due to the bodies need for essential vitamins, minerals, and fats the pt was educated on the need to consume a certain amount of calories as well as certain nutrients daily. Pt was educated on the need for daily physical activity and to reach a goal of at least 150 minutes of moderate to vigorous physical activity as directed by their physician due to such benefits as increased musculature and improved lab values.   Goals: -Take Opurity capsule  -Continue to aim for 30 minutes of activity at least 5 times a week -Aim for 64 fluid ounces (gatorade zero, unsweet tea, sugar free drinks, etc.) a day  -Use your palm to measure about how much steak you are eating when you go out to eat -Aim to use your sit up board or other machine 30 minutes 2 days a week -Continue to eat your non starchy vegetables  -Try using your nieces plate to eat off of  Teaching Method Utilized:  Visual Auditory Hands on  Barriers to learning/adherence to lifestyle  change: rushing through meals  Demonstrated degree of understanding via:  Teach Back   Monitoring/Evaluation:  Dietary intake, exercise, and body weight.

## 2018-02-25 ENCOUNTER — Emergency Department
Admission: EM | Admit: 2018-02-25 | Discharge: 2018-02-25 | Disposition: A | Discharge status: Home / Self Care | Payer: BLUE CROSS/BLUE SHIELD | Source: Home / Self Care | Attending: Family Medicine | Admitting: Family Medicine

## 2018-02-25 ENCOUNTER — Encounter: Payer: Self-pay | Admitting: Emergency Medicine

## 2018-02-25 DIAGNOSIS — J0100 Acute maxillary sinusitis, unspecified: Secondary | ICD-10-CM | POA: Diagnosis not present

## 2018-02-25 MED ORDER — AZITHROMYCIN 250 MG PO TABS: 250.0000 mg | ORAL_TABLET | Freq: Every day | ORAL | 0 refills | Status: DC

## 2018-02-25 MED ORDER — IPRATROPIUM BROMIDE 0.06 % NA SOLN: 2.0000 | Freq: Four times a day (QID) | NASAL | 1 refills | Status: DC

## 2018-02-25 NOTE — ED Provider Notes (Signed)
[facility]    CSN: [id number] Arrival date & time: 02/25/18  1342     History   Chief Complaint Chief Complaint  Patient presents with  . Facial Pain    HPI Chelsea [name] is a 33 y.o. female.   HPI Chelsea [name] is a 33 y.o. female presenting to UC with c/o 4 days of worsening sinus pain and pressure with sinus headache.  Fever of 100*F last night.  She took Advil at 4AM this morning. Others around her have also had cough/congestion.  Denies n/v/d. Hx of sinus infections in the past, symptoms feel similar. Mild cough.    Past Medical History:  Diagnosis Date  . Depression   . Endometrial polyp   . Fibromyalgia   . History of recurrent UTIs   . Hypertension   . Infertility, anovulation   . Myofascial muscle pain   . OA (osteoarthritis) of knee    BILATERAL  . PCOS (polycystic ovarian syndrome)   . PONV (postoperative nausea and vomiting)   . Seasonal allergic rhinitis     Patient Active Problem List   Diagnosis Date Noted  . Diet-controlled diabetes mellitus (HCC) 04/25/2017  . Subacromial bursitis of left shoulder joint 04/25/2016  . Hand, foot and mouth disease 01/10/2016  . Low back pain 08/24/2015  . Annual physical exam 03/23/2015  . Intractable migraine without aura and with status migrainosus 06/24/2014  . Myofascial pain syndrome with depression 02/08/2014  . Venous stasis dermatitis 01/25/2014  . Right hip pain 12/13/2013  . Dysuria 11/15/2013  . Metatarsalgia of both feet 10/18/2013  . Chondromalacia of patellofemoral joint 02/13/2012  . Essential hypertension, benign 11/25/2008  . POLYCYSTIC OVARIAN DISEASE 10/26/2008  . OBESITY, CLASS III 10/26/2008    Past Surgical History:  Procedure Laterality Date  . HYSTEROSCOPY W/D&C N/A 09/07/2014   Procedure:  DILATATION AND CURETTAGE /HYSTEROSCOPY/POLYPECTOMY;  Surgeon: Fermin Schwab, MD;  Location: [facility] [place];  Service: Gynecology;  Laterality: N/A;  . I & D RIGHT  FOOT  1998  . LAPAROSCOPIC GASTRIC SLEEVE RESECTION N/A 10/20/2017   Procedure: LAPAROSCOPIC GASTRIC SLEEVE RESECTION, UPPER ENDO, ERAS Pathway;  Surgeon: Berna Bue, MD;  Location: [facility];  Service: General;  Laterality: N/A;  . POLYPECTOMY     uterine  . TONSILLECTOMY  2001  . WISDOM TOOTH EXTRACTION  2004    OB History    Gravida  0   Para  0   Term  0   Preterm  0   AB  0   Living  0     SAB  0   TAB  0   Ectopic  0   Multiple  0   Live Births               Home Medications    Prior to Admission medications   Medication Sig Start Date End Date Taking? Authorizing Provider  amLODipine (NORVASC) 10 MG tablet TAKE 1 TABLET BY MOUTH EVERY DAY 10/16/17   Monica Becton, MD  azithromycin (ZITHROMAX) 250 MG tablet Take 1 tablet (250 mg total) by mouth daily. Take first 2 tablets together, then 1 every day until finished. 02/25/18   Lurene Shadow, PA-C  DULoxetine (CYMBALTA) 60 MG capsule Take 2 capsules (120 mg total) by mouth daily. Patient taking differently: Take 60 mg by mouth 2 (two) times daily.  04/10/17   Monica Becton, MD  ipratropium (ATROVENT) 0.06 % nasal spray Place 2 sprays into both nostrils  4 (four) times daily. 02/25/18   Lurene Shadow, PA-C  metoprolol tartrate (LOPRESSOR) 50 MG tablet Take 1 tablet (50 mg total) by mouth 2 (two) times daily. 01/30/18   Monica Becton, MD  ondansetron (ZOFRAN-ODT) 4 MG disintegrating tablet Take 4 mg by mouth every 8 (eight) hours as needed for nausea/vomiting. 09/25/17   [provider]  oxyCODONE (ROXICODONE) 5 MG/5ML solution Take 5-10 mLs by mouth every 4 (four) hours as needed for pain. 09/26/17   [provider]  pantoprazole (PROTONIX) 40 MG tablet Take 40 mg by mouth daily. 09/25/17   [provider]  rizatriptan (MAXALT) 10 MG tablet Take 1 tablet (10 mg total) by mouth as needed for migraine. May repeat in 2 hours if needed 09/26/14   Monica Becton, MD    triamcinolone (KENALOG) 0.1 % paste Use as directed 1 application in the mouth or throat 2 (two) times daily. Patient taking differently: Use as directed 1 application in the mouth or throat 2 (two) times daily as needed (for ulcers.).  01/02/16   Rodolph Bong, MD  valACYclovir (VALTREX) 1000 MG tablet Take 1 tablet (1,000 mg total) by mouth 2 (two) times daily. Patient taking differently: Take 1,000 mg by mouth 2 (two) times daily as needed (for cold sores.).  01/02/16   Rodolph Bong, MD  valsartan (DIOVAN) 320 MG tablet TAKE 1 TABLET BY MOUTH EVERY DAY 01/25/18   Monica Becton, MD  valsartan-hydrochlorothiazide (DIOVAN-HCT) 320-25 MG tablet TAKE 1 TABLET BY MOUTH EVERY DAY Patient not taking: Reported on 11/05/2017 11/03/17   Monica Becton, MD    Family History Family History  Problem Relation Age of Onset  . Diabetes Mother   . Polycystic ovary syndrome Mother   . Hypertension Father   . Diabetes Father   . Fibromyalgia Maternal Aunt   . Fibromyalgia Cousin   . Cancer Paternal Grandfather        prostate  . Glaucoma Paternal Grandmother     Social History Social History   Tobacco Use  . Smoking status: Never Smoker  . Smokeless tobacco: Never Used  Substance Use Topics  . Alcohol use: No    Alcohol/week: 0.0 standard drinks  . Drug use: No     Allergies   Amoxicillin; Enalapril maleate; Erythromycin; Penicillins; and Sulfa antibiotics   Review of Systems Review of Systems  Constitutional: Negative for chills and fever.  HENT: Positive for congestion, postnasal drip, sinus pressure and sinus pain. Negative for ear pain, sore throat, trouble swallowing and voice change.   Respiratory: Positive for cough. Negative for shortness of breath.   Cardiovascular: Negative for chest pain and palpitations.  Gastrointestinal: Negative for abdominal pain, diarrhea, nausea and vomiting.  Musculoskeletal: Negative for arthralgias, back pain and myalgias.  Skin:  Negative for rash.  Neurological: Positive for headaches. Negative for dizziness and light-headedness.     Physical Exam Triage Vital Signs ED Triage Vitals  Enc Vitals Group     BP 02/25/18 1424 123/83     Pulse Rate 02/25/18 1424 100     Resp --      Temp 02/25/18 1424 97.8 F (36.6 C)     Temp Source 02/25/18 1424 Oral     SpO2 02/25/18 1424 98 %     Weight 02/25/18 1425 290 lb (131.5 kg)     Height --      Head Circumference --      Peak Flow --  Pain Score 02/25/18 1424 0     Pain Loc --      Pain Edu? --      Excl. in GC? --    No data found.  Updated Vital Signs BP 123/83 (BP Location: Right Arm)   Pulse 100   Temp 97.8 F (36.6 C) (Oral)   Wt 290 lb (131.5 kg)   SpO2 98%   BMI 42.83 kg/m   Visual Acuity Right Eye Distance:   Left Eye Distance:   Bilateral Distance:    Right Eye Near:   Left Eye Near:    Bilateral Near:     Physical Exam Vitals signs and nursing note reviewed.  Constitutional:      Appearance: Normal appearance. She is well-developed.  HENT:     Head: Normocephalic and atraumatic.     Right Ear: Tympanic membrane normal.     Left Ear: Tympanic membrane is erythematous. Tympanic membrane is not bulging.     Nose:     Right Sinus: Maxillary sinus tenderness present. No frontal sinus tenderness.     Left Sinus: Maxillary sinus tenderness present. No frontal sinus tenderness.     Mouth/Throat:     Lips: Pink.     Mouth: Mucous membranes are moist.     Pharynx: Oropharynx is clear. Uvula midline. No pharyngeal swelling, oropharyngeal exudate, posterior oropharyngeal erythema or uvula swelling.  Neck:     Musculoskeletal: Normal range of motion and neck supple.  Cardiovascular:     Rate and Rhythm: Normal rate and regular rhythm.  Pulmonary:     Effort: Pulmonary effort is normal.     Breath sounds: Normal breath sounds. No stridor. No wheezing or rhonchi.  Musculoskeletal: Normal range of motion.  Skin:    General: Skin is  warm and dry.  Neurological:     Mental Status: She is alert and oriented to person, place, and time.  Psychiatric:        Behavior: Behavior normal.      UC Treatments / Results  Labs (all labs ordered are listed, but only abnormal results are displayed) Labs Reviewed - No data to display  EKG None  Radiology No results found.  Procedures Procedures (including critical care time)  Medications Ordered in UC Medications - No data to display  Initial Impression / Assessment and Plan / UC Course  I have reviewed the triage vital signs and the nursing notes.  Pertinent labs & imaging results that were available during my care of the patient were reviewed by me and considered in my medical decision making (see chart for details).     Hx and exam c/w maxillary sinusitis  Will tx with azithromycin, has done well in the past.   Final Clinical Impressions(s) / UC Diagnoses   Final diagnoses:  Acute non-recurrent maxillary sinusitis     Discharge Instructions      For the Ipratropium nasal spray, be sure to use as prescribed to help prevent post-nasal drip, which can trigger coughing, especially at night.  Use 2 sprays per nostril 4 times daily while sick.  You should spray one time in each nostril pointing the spray to the out portion of your nostril, breath in slowly while spraying. Wait about 30 seconds to 1 minute before given the second spray in each nostril.  This will ensure you get the most benefit from each spray.    Please take antibiotics as prescribed and be sure to complete entire course even if you start  to feel better to ensure infection does not come back.     ED Prescriptions    Medication Sig Dispense Auth. Provider   azithromycin (ZITHROMAX) 250 MG tablet Take 1 tablet (250 mg total) by mouth daily. Take first 2 tablets together, then 1 every day until finished. 6 tablet Waylan RocherPhelps, Jaedin Trumbo O, PA-C   ipratropium (ATROVENT) 0.06 % nasal spray Place 2 sprays  into both nostrils 4 (four) times daily. 15 mL Lurene ShadowPhelps, Annison Birchard O, PA-C     Controlled Substance Prescriptions [place] Controlled Substance Registry consulted? Not Applicable   Rolla Platehelps, Hendrik Donath O, PA-C 02/25/18 1513

## 2018-02-25 NOTE — ED Triage Notes (Signed)
Pt c/o HA, sinus pressure and fever of 100 since Sunday. advil at 4am.

## 2018-02-25 NOTE — Discharge Instructions (Signed)
°  For the Ipratropium nasal spray, be sure to use as prescribed to help prevent post-nasal drip, which can trigger coughing, especially at night.  Use 2 sprays per nostril 4 times daily while sick.  You should spray one time in each nostril pointing the spray to the out portion of your nostril, breath in slowly while spraying. Wait about 30 seconds to 1 minute before given the second spray in each nostril.  This will ensure you get the most benefit from each spray.    Please take antibiotics as prescribed and be sure to complete entire course even if you start to feel better to ensure infection does not come back.

## 2018-03-28 ENCOUNTER — Other Ambulatory Visit: Payer: Self-pay | Admitting: Sports Medicine

## 2018-03-28 DIAGNOSIS — I10 Essential (primary) hypertension: Secondary | ICD-10-CM

## 2018-03-31 ENCOUNTER — Encounter: Payer: Self-pay | Admitting: Sports Medicine

## 2018-03-31 NOTE — Telephone Encounter (Signed)
Called patient to schedule her for an acute visit.

## 2018-04-01 ENCOUNTER — Encounter: Payer: Self-pay | Admitting: Sports Medicine

## 2018-04-01 ENCOUNTER — Ambulatory Visit (INDEPENDENT_AMBULATORY_CARE_PROVIDER_SITE_OTHER): Payer: BLUE CROSS/BLUE SHIELD | Admitting: Sports Medicine

## 2018-04-01 DIAGNOSIS — I10 Essential (primary) hypertension: Secondary | ICD-10-CM

## 2018-04-01 NOTE — Progress Notes (Signed)
Subjective:    CC: Dizziness  HPI: Chelsea [name] is a pleasant 33 year old female, she is post sleeve gastrectomy, she has lost about 50 pounds.  Historically she was on valsartan/HCTZ, amlodipine, and metoprolol.  As she has lost weight she has found herself getting more dizzy.  She stopped all of her blood pressure medicine several days ago.  Feeling a bit better.  I reviewed the past medical history, family history, social history, surgical history, and allergies today and no changes were needed.  Please see the problem list section below in epic for further details.  Past Medical History: Past Medical History:  Diagnosis Date  . Depression   . Endometrial polyp   . Fibromyalgia   . History of recurrent UTIs   . Hypertension   . Infertility, anovulation   . Myofascial muscle pain   . OA (osteoarthritis) of knee    BILATERAL  . PCOS (polycystic ovarian syndrome)   . PONV (postoperative nausea and vomiting)   . Seasonal allergic rhinitis    Past Surgical History: Past Surgical History:  Procedure Laterality Date  . HYSTEROSCOPY W/D&C N/A 09/07/2014   Procedure:  DILATATION AND CURETTAGE /HYSTEROSCOPY/POLYPECTOMY;  Surgeon: Fermin Schwab, MD;  Location: [facility] [place];  Service: Gynecology;  Laterality: N/A;  . I & D RIGHT FOOT  1998  . LAPAROSCOPIC GASTRIC SLEEVE RESECTION N/A 10/20/2017   Procedure: LAPAROSCOPIC GASTRIC SLEEVE RESECTION, UPPER ENDO, ERAS Pathway;  Surgeon: Berna Bue, MD;  Location: [facility];  Service: General;  Laterality: N/A;  . POLYPECTOMY     uterine  . TONSILLECTOMY  2001  . WISDOM TOOTH EXTRACTION  2004   Social History: Social History   Socioeconomic History  . Marital status: Married    Spouse name: Not on file  . Number of children: Not on file  . Years of education: Not on file  . Highest education level: Not on file  Occupational History  . Occupation: Programmer, systems  Social Needs  . Financial resource strain: Not on file  .  Food insecurity:    Worry: Not on file    Inability: Not on file  . Transportation needs:    Medical: Not on file    Non-medical: Not on file  Tobacco Use  . Smoking status: Never Smoker  . Smokeless tobacco: Never Used  Substance and Sexual Activity  . Alcohol use: No    Alcohol/week: 0.0 standard drinks  . Drug use: No  . Sexual activity: Not Currently    Partners: Female  Lifestyle  . Physical activity:    Days per week: Not on file    Minutes per session: Not on file  . Stress: Not on file  Relationships  . Social connections:    Talks on phone: Not on file    Gets together: Not on file    Attends religious service: Not on file    Active member of club or organization: Not on file    Attends meetings of clubs or organizations: Not on file    Relationship status: Not on file  Other Topics Concern  . Not on file  Social History Narrative  . Not on file   Family History: Family History  Problem Relation Age of Onset  . Diabetes Mother   . Polycystic ovary syndrome Mother   . Hypertension Father   . Diabetes Father   . Fibromyalgia Maternal Aunt   . Fibromyalgia Cousin   . Cancer Paternal Grandfather  prostate  . Glaucoma Paternal Grandmother    Allergies: Allergies  Allergen Reactions  . Amoxicillin Other (See Comments)    Caused yeast infection Has patient had a PCN reaction causing immediate rash, facial/tongue/throat swelling, SOB or lightheadedness with hypotension: Unknown Has patient had a PCN reaction causing severe rash involving mucus membranes or skin necrosis: No Has patient had a PCN reaction that required hospitalization: No Has patient had a PCN reaction occurring within the last 10 years: No Childhood allergy If all of the above answers are "NO", then may proceed with Cephalosporin use.   . Enalapril Maleate Swelling    lip swelling  . Erythromycin Diarrhea  . Penicillins Other (See Comments)    Caused yeast infection Has patient  had a PCN reaction causing immediate rash, facial/tongue/throat swelling, SOB or lightheadedness with hypotension: Unknown Has patient had a PCN reaction causing severe rash involving mucus membranes or skin necrosis: No Has patient had a PCN reaction that required hospitalization: No Has patient had a PCN reaction occurring within the last 10 years: No Childhood allergy If all of the above answers are "NO", then may proceed with Cephalosporin use.    . Sulfa Antibiotics Other (See Comments)    Unsure of reaction type   Medications: See med rec.  Review of Systems: No fevers, chills, night sweats, weight loss, chest pain, or shortness of breath.   Objective:    General: Well Developed, well nourished, and in no acute distress.  Neuro: Alert and oriented x3, extra-ocular muscles intact, sensation grossly intact.  HEENT: Normocephalic, atraumatic, pupils equal round reactive to light, neck supple, no masses, no lymphadenopathy, thyroid nonpalpable.  Skin: Warm and dry, no rashes. Cardiac: Regular rate and rhythm, no murmurs rubs or gallops, no lower extremity edema.  Respiratory: Clear to auscultation bilaterally. Not using accessory muscles, speaking in full sentences.  Impression and Recommendations:    Essential hypertension, benign Having some orthostasis, she is about 50 pounds down with gastric sleeve. She has not taken any of her blood pressure medicines in several days, I think this is now cured/diet controlled. ___________________________________________ [name]. Benjamin Stain, M.D., ABFM., CAQSM. Primary Care and Sports Medicine [facility]  Adjunct Professor of Family Medicine  [employer]

## 2018-04-01 NOTE — Assessment & Plan Note (Signed)
Having some orthostasis, she is about 50 pounds down with gastric sleeve. She has not taken any of her blood pressure medicines in several days, I think this is now cured/diet controlled.

## 2018-04-02 LAB — HM DIABETES EYE EXAM

## 2018-04-09 ENCOUNTER — Encounter: Payer: Self-pay | Admitting: Skilled Nursing Facility1

## 2018-04-09 ENCOUNTER — Encounter: Payer: BLUE CROSS/BLUE SHIELD | Attending: Surgery | Admitting: Skilled Nursing Facility1

## 2018-04-09 DIAGNOSIS — E119 Type 2 diabetes mellitus without complications: Secondary | ICD-10-CM

## 2018-04-09 DIAGNOSIS — Z713 Dietary counseling and surveillance: Secondary | ICD-10-CM | POA: Insufficient documentation

## 2018-04-09 NOTE — Progress Notes (Signed)
Follow-up visit: Post-Operative Sleeve Surgery  Primary concerns today: Post-operative Bariatric Surgery Nutrition Management.  Pt states she was feeling nauseous and lightheaded but now that she is off her blood pressure medication is feeling much better. Pt states she wakes up int he middle of the night and eats. Pt states she has been stress eating due to her partner and her mom not having a job anymore.   Surgery date: 10/20/2017 Surgery type: Sleeve Start weight at [facility]: 348.6 Weight today: 302 Weight change: -1  TANITA  BODY COMP RESULTS  11/04/2017 12/18/2017 02/04/2018 04/09/2018   BMI (kg/m^2) 47.7 47 44.7 44.6   Fat Mass (lbs) 175.0 169.2 155.8 154.6   Fat Free Mass (lbs) 143.2 139.6 147.2 147.4   Total Body Water (lbs) 108.0 105 110.2 110.2    24-hr recall: eating 4-5 times a day B (AM): 1-1.5 with cheese and milk eggs with 1/2 cup milk  Snk (AM): 1.5 pieces cheese L (PM): [place] and cheese roll up or chicken with ketchup and steamed broccoli and carrots  Snk (PM): 5 vienna sausages and lunch [place] with honey mustard or p3 or vegetables with ranch and string cheese D (PM): hamburger steak with onion or grilled chicken or fish and steamed vegetables or meatloaf or meat sauce  Snk (PM): nuts and ham and sunflower seed and p3   Fluid intake: g2 gatorade, 1% milk, unsweet tea, water: 48 oz  Estimated total protein intake: 80+  Medications: see list  Supplementation: Opurity and calcium  Using straws: no Drinking while eating: yes Having you been chewing well: no Chewing/swallowing difficulties: no Changes in vision: no Changes to mood/headaches: no Hair loss/Cahnges to skin/Changes to nails: no Any difficulty focusing or concentrating: no Sweating: no Dizziness/Lightheaded: no Palpitations: no  Carbonated beverages: no N/V/D/C/GAS: no Abdominal Pain: no Dumping syndrome: no  Recent physical activity:  Walking 2 days a week around the house  (inconsistent)  Progress Towards Goal(s):  In progress.   Nutritional Diagnosis:  [place]-3.3 Overweight/obesity related to past poor dietary habits and physical inactivity as evidenced by patient w/ recent sleeve surgery following dietary guidelines for continued weight loss.    Intervention:  Nutrition counseling. Pts diet was advanced to the next phase now including non starchy veggies. Due to the bodies need for essential vitamins, minerals, and fats the pt was educated on the need to consume a certain amount of calories as well as certain nutrients daily. Pt was educated on the need for daily physical activity and to reach a goal of at least 150 minutes of moderate to vigorous physical activity as directed by their physician due to such benefits as increased musculature and improved lab values.   Goals: -Continue to aim for 30 minutes of activity at least 5 times a week -Continue to aim for a minimum of 64 fluid ounces 7 days a week -Do not cook with shortening  -Work on not eating when you wake up in the middle of the night -Work on finding ways to deal with your stress other than food: using handout  -Identify in the moment if you are about to eat because you are hungry or because of appetite  Teaching Method Utilized:  Visual Auditory Hands on  Barriers to learning/adherence to lifestyle change: rushing through meals  Demonstrated degree of understanding via:  Teach Back   Monitoring/Evaluation:  Dietary intake, exercise, and body weight.

## 2018-04-09 NOTE — Patient Instructions (Addendum)
-  Continue to aim for a minimum of 64 fluid ounces 7 days a week  -Do not cook with shortening   -Work on not eating when you wake up in the middle of the night  -Work on finding ways to deal with your stress other than food: using handout   -Identify in the moment if you are about to eat because you are hungry or because of appetite

## 2018-04-13 ENCOUNTER — Ambulatory Visit (INDEPENDENT_AMBULATORY_CARE_PROVIDER_SITE_OTHER): Payer: BLUE CROSS/BLUE SHIELD | Admitting: Sports Medicine

## 2018-04-13 ENCOUNTER — Encounter: Payer: Self-pay | Admitting: Sports Medicine

## 2018-04-13 DIAGNOSIS — M2241 Chondromalacia patellae, right knee: Secondary | ICD-10-CM | POA: Diagnosis not present

## 2018-04-13 NOTE — Progress Notes (Signed)
Subjective:    CC: Right knee pain  HPI: Chelsea [name] is a pleasant 33 year old female, she has knee osteoarthritis.  Her last injection was about a year and 1/2 to 2 years ago and in the left knee, she is now starting to have pain but mostly in the right, under the kneecap and the medial joint line.  She did have gastric sleeve and has lost a great deal of weight, and overall her knees do feel better.  She has tried oral analgesics, activity modification without any improvement.  No mechanical symptoms, no trauma.  I reviewed the past medical history, family history, social history, surgical history, and allergies today and no changes were needed.  Please see the problem list section below in epic for further details.  Past Medical History: Past Medical History:  Diagnosis Date  . Depression   . Endometrial polyp   . Fibromyalgia   . History of recurrent UTIs   . Hypertension   . Infertility, anovulation   . Myofascial muscle pain   . OA (osteoarthritis) of knee    BILATERAL  . PCOS (polycystic ovarian syndrome)   . PONV (postoperative nausea and vomiting)   . Seasonal allergic rhinitis    Past Surgical History: Past Surgical History:  Procedure Laterality Date  . HYSTEROSCOPY W/D&C N/A 09/07/2014   Procedure:  DILATATION AND CURETTAGE /HYSTEROSCOPY/POLYPECTOMY;  Surgeon: Fermin Schwab, MD;  Location: [facility];  Service: Gynecology;  Laterality: N/A;  . I & D RIGHT FOOT  1998  . LAPAROSCOPIC GASTRIC SLEEVE RESECTION N/A 10/20/2017   Procedure: LAPAROSCOPIC GASTRIC SLEEVE RESECTION, UPPER ENDO, ERAS Pathway;  Surgeon: Berna Bue, MD;  Location: [facility];  Service: General;  Laterality: N/A;  . POLYPECTOMY     uterine  . TONSILLECTOMY  2001  . WISDOM TOOTH EXTRACTION  2004   Social History: Social History   Socioeconomic History  . Marital status: Married    Spouse name: Not on file  . Number of children: Not on file  . Years of education: Not on file    . Highest education level: Not on file  Occupational History  . Occupation: Programmer, systems  Social Needs  . Financial resource strain: Not on file  . Food insecurity:    Worry: Not on file    Inability: Not on file  . Transportation needs:    Medical: Not on file    Non-medical: Not on file  Tobacco Use  . Smoking status: Never Smoker  . Smokeless tobacco: Never Used  Substance and Sexual Activity  . Alcohol use: No    Alcohol/week: 0.0 standard drinks  . Drug use: No  . Sexual activity: Not Currently    Partners: Female  Lifestyle  . Physical activity:    Days per week: Not on file    Minutes per session: Not on file  . Stress: Not on file  Relationships  . Social connections:    Talks on phone: Not on file    Gets together: Not on file    Attends religious service: Not on file    Active member of club or organization: Not on file    Attends meetings of clubs or organizations: Not on file    Relationship status: Not on file  Other Topics Concern  . Not on file  Social History Narrative  . Not on file   Family History: Family History  Problem Relation Age of Onset  . Diabetes Mother   . Polycystic ovary syndrome Mother   .  Hypertension Father   . Diabetes Father   . Fibromyalgia Maternal Aunt   . Fibromyalgia Cousin   . Cancer Paternal Grandfather        prostate  . Glaucoma Paternal Grandmother    Allergies: Allergies  Allergen Reactions  . Amoxicillin Other (See Comments)    Caused yeast infection Has patient had a PCN reaction causing immediate rash, facial/tongue/throat swelling, SOB or lightheadedness with hypotension: Unknown Has patient had a PCN reaction causing severe rash involving mucus membranes or skin necrosis: No Has patient had a PCN reaction that required hospitalization: No Has patient had a PCN reaction occurring within the last 10 years: No Childhood allergy If all of the above answers are "NO", then may proceed with Cephalosporin use.    . Enalapril Maleate Swelling    lip swelling  . Erythromycin Diarrhea  . Penicillins Other (See Comments)    Caused yeast infection Has patient had a PCN reaction causing immediate rash, facial/tongue/throat swelling, SOB or lightheadedness with hypotension: Unknown Has patient had a PCN reaction causing severe rash involving mucus membranes or skin necrosis: No Has patient had a PCN reaction that required hospitalization: No Has patient had a PCN reaction occurring within the last 10 years: No Childhood allergy If all of the above answers are "NO", then may proceed with Cephalosporin use.    . Sulfa Antibiotics Other (See Comments)    Unsure of reaction type   Medications: See med rec.  Review of Systems: No fevers, chills, night sweats, weight loss, chest pain, or shortness of breath.   Objective:    General: Well Developed, well nourished, and in no acute distress.  Neuro: Alert and oriented x3, extra-ocular muscles intact, sensation grossly intact.  HEENT: Normocephalic, atraumatic, pupils equal round reactive to light, neck supple, no masses, no lymphadenopathy, thyroid nonpalpable.  Skin: Warm and dry, no rashes. Cardiac: Regular rate and rhythm, no murmurs rubs or gallops, no lower extremity edema.  Respiratory: Clear to auscultation bilaterally. Not using accessory muscles, speaking in full sentences. Right knee: Mild effusion, tender to palpation at the patellar facets of the medial joint line ROM normal in flexion and extension and lower leg rotation. Ligaments with solid consistent endpoints including ACL, PCL, LCL, MCL. Negative Mcmurray's and provocative meniscal tests. Non painful patellar compression. Patellar and quadriceps tendons unremarkable. Hamstring and quadriceps strength is normal.  Procedure: Real-time Ultrasound Guided Injection of right knee joint Device: GE Logiq E  Verbal informed consent obtained.  Time-out conducted.  Noted no overlying  erythema, induration, or other signs of local infection.  Skin prepped in a sterile fashion.  Local anesthesia: Topical Ethyl chloride.  With sterile technique and under real time ultrasound guidance: 1 cc Kenalog 40, 2 cc lidocaine, 2 cc bupivacaine injected easily Completed without difficulty  Pain immediately resolved suggesting accurate placement of the medication.  Advised to call if fevers/chills, erythema, induration, drainage, or persistent bleeding.  Images permanently stored and available for review in the ultrasound unit.  Impression: Technically successful ultrasound guided injection.  Impression and Recommendations:    Chondromalacia of patellofemoral joint Previous injection was on the left and about 1-1/2 years ago. Right knee aspiration and injection, return as needed. ___________________________________________ Chelsea [name]. Benjamin Stain, M.D., ABFM., CAQSM. Primary Care and Sports Medicine  [facility]  Adjunct Professor of Family Medicine  [employer]

## 2018-04-13 NOTE — Assessment & Plan Note (Signed)
Previous injection was on the left and about 1-1/2 years ago. Right knee aspiration and injection, return as needed.

## 2018-05-11 ENCOUNTER — Encounter: Payer: Self-pay | Admitting: Sports Medicine

## 2018-05-14 ENCOUNTER — Ambulatory Visit: Payer: BLUE CROSS/BLUE SHIELD | Admitting: Sports Medicine

## 2018-05-16 ENCOUNTER — Encounter: Payer: Self-pay | Admitting: Emergency Medicine

## 2018-05-16 ENCOUNTER — Emergency Department
Admission: EM | Admit: 2018-05-16 | Discharge: 2018-05-16 | Disposition: A | Discharge status: Home / Self Care | Payer: BLUE CROSS/BLUE SHIELD | Source: Home / Self Care

## 2018-05-16 DIAGNOSIS — N3001 Acute cystitis with hematuria: Secondary | ICD-10-CM | POA: Diagnosis not present

## 2018-05-16 DIAGNOSIS — R300 Dysuria: Secondary | ICD-10-CM | POA: Diagnosis not present

## 2018-05-16 DIAGNOSIS — L02211 Cutaneous abscess of abdominal wall: Secondary | ICD-10-CM

## 2018-05-16 LAB — POCT URINALYSIS DIP (MANUAL ENTRY)
Bilirubin, UA: NEGATIVE
Glucose, UA: NEGATIVE mg/dL
Ketones, POC UA: NEGATIVE mg/dL
Nitrite, UA: NEGATIVE
Protein Ur, POC: >=300 mg/dL — AB
Spec Grav, UA: 1.025 (ref 1.010–1.025)
Urobilinogen, UA: 0.2 E.U./dL
pH, UA: 5.5 (ref 5.0–8.0)

## 2018-05-16 MED ORDER — CEPHALEXIN 500 MG PO CAPS: 500.0000 mg | ORAL_CAPSULE | Freq: Three times a day (TID) | ORAL | 0 refills | Status: DC

## 2018-05-16 MED ORDER — MUPIROCIN 2 % EX OINT: TOPICAL_OINTMENT | CUTANEOUS | 0 refills | Status: DC

## 2018-05-16 NOTE — ED Provider Notes (Signed)
[facility]    CSN: [id number] Arrival date & time: 05/16/18  [id number]     History   Chief Complaint Chief Complaint  Patient presents with  . Urinary Tract Infection    HPI Barbaraann CaoCarla J Imber is a 33 y.o. female.   HPI  Barbaraann CaoCarla J Pultz is a 33 y.o. female presenting to UC with c/o sudden onset dysuria with lower abdominal cramping that started last night, radiates into lower back. Urine is cloudy but no blood seen. Hx of UTIs in the past, symptoms feel similar. She has not tried any Azo because it has not helped in the past.  She also reports noticing a small skin sore on her lower abdomen. Its red, tender and drained a small amount of pus yesterday.  It has been there about 3-4 days.     Past Medical History:  Diagnosis Date  . Depression   . Endometrial polyp   . Fibromyalgia   . History of recurrent UTIs   . Hypertension   . Infertility, anovulation   . Myofascial muscle pain   . OA (osteoarthritis) of knee    BILATERAL  . PCOS (polycystic ovarian syndrome)   . PONV (postoperative nausea and vomiting)   . Seasonal allergic rhinitis     Patient Active Problem List   Diagnosis Date Noted  . Diet-controlled diabetes mellitus (HCC) 04/25/2017  . Subacromial bursitis of left shoulder joint 04/25/2016  . Low back pain 08/24/2015  . Annual physical exam 03/23/2015  . Intractable migraine without aura and with status migrainosus 06/24/2014  . Myofascial pain syndrome with depression 02/08/2014  . Venous stasis dermatitis 01/25/2014  . Right hip pain 12/13/2013  . Metatarsalgia of both feet 10/18/2013  . Chondromalacia of patellofemoral joint 02/13/2012  . Essential hypertension, benign 11/25/2008  . POLYCYSTIC OVARIAN DISEASE 10/26/2008  . OBESITY, CLASS III 10/26/2008    Past Surgical History:  Procedure Laterality Date  . HYSTEROSCOPY W/D&C N/A 09/07/2014   Procedure:  DILATATION AND CURETTAGE /HYSTEROSCOPY/POLYPECTOMY;  Surgeon: Fermin Schwabamer Yalcinkaya, MD;   [facility];  Service: Gynecology;  Laterality: N/A;  . I & D RIGHT FOOT  1998  . LAPAROSCOPIC GASTRIC SLEEVE RESECTION N/A 10/20/2017   Procedure: LAPAROSCOPIC GASTRIC SLEEVE RESECTION, UPPER ENDO, ERAS Pathway;  Surgeon: Berna Bueonnor, Chelsea A, MD;  Location: [facility];  Service: General;  Laterality: N/A;  . POLYPECTOMY     uterine  . TONSILLECTOMY  2001  . WISDOM TOOTH EXTRACTION  2004    OB History    Gravida  0   Para  0   Term  0   Preterm  0   AB  0   Living  0     SAB  0   TAB  0   Ectopic  0   Multiple  0   Live Births               Home Medications    Prior to Admission medications   Medication Sig Start Date End Date Taking? Authorizing Provider  cephALEXin (KEFLEX) 500 MG capsule Take 1 capsule (500 mg total) by mouth 3 (three) times daily. 05/16/18   Lurene ShadowPhelps, Duran Ohern O, PA-C  DULoxetine (CYMBALTA) 60 MG capsule Take 2 capsules (120 mg total) by mouth daily. Patient taking differently: Take 60 mg by mouth 2 (two) times daily.  04/10/17   Monica Bectonhekkekandam, Thomas J, MD  ipratropium (ATROVENT) 0.06 % nasal spray Place 2 sprays into both nostrils 4 (four) times daily. 02/25/18  Lurene Shadow, PA-C  mupirocin ointment (BACTROBAN) 2 % Apply to wound 3 times daily for 5 days 05/16/18   Lurene Shadow, PA-C  pantoprazole (PROTONIX) 40 MG tablet Take 40 mg by mouth daily. 09/25/17   [provider]  rizatriptan (MAXALT) 10 MG tablet Take 1 tablet (10 mg total) by mouth as needed for migraine. May repeat in 2 hours if needed 09/26/14   Monica Becton, MD  triamcinolone (KENALOG) 0.1 % paste Use as directed 1 application in the mouth or throat 2 (two) times daily. Patient taking differently: Use as directed 1 application in the mouth or throat 2 (two) times daily as needed (for ulcers.).  01/02/16   Rodolph Bong, MD  valACYclovir (VALTREX) 1000 MG tablet Take 1 tablet (1,000 mg total) by mouth 2 (two) times daily. Patient taking differently:  Take 1,000 mg by mouth 2 (two) times daily as needed (for cold sores.).  01/02/16   Rodolph Bong, MD    Family History Family History  Problem Relation Age of Onset  . Diabetes Mother   . Polycystic ovary syndrome Mother   . Hypertension Father   . Diabetes Father   . Fibromyalgia Maternal Aunt   . Fibromyalgia Cousin   . Cancer Paternal Grandfather        prostate  . Glaucoma Paternal Grandmother     Social History Social History   Tobacco Use  . Smoking status: Never Smoker  . Smokeless tobacco: Never Used  Substance Use Topics  . Alcohol use: No    Alcohol/week: 0.0 standard drinks  . Drug use: No     Allergies   Amoxicillin; Enalapril maleate; Erythromycin; Penicillins; and Sulfa antibiotics   Review of Systems Review of Systems  Constitutional: Negative for chills and fever.  Gastrointestinal: Positive for abdominal pain. Negative for diarrhea, nausea and vomiting.  Genitourinary: Positive for dysuria, frequency, pelvic pain and urgency. Negative for flank pain.  Musculoskeletal: Positive for back pain. Negative for myalgias.  Skin: Positive for color change and wound.  Neurological: Negative for dizziness and headaches.     Physical Exam Triage Vital Signs ED Triage Vitals [05/16/18 1029]  Enc Vitals Group     BP (!) 178/90     Pulse Rate (!) 101     Resp      Temp 98.5 F (36.9 C)     Temp Source Oral     SpO2 98 %     Weight (!) 302 lb (137 kg)     Height 5\' 9"  (1.753 m)     Head Circumference      Peak Flow      Pain Score 0     Pain Loc      Pain Edu?      Excl. in GC?    No data found.  Updated Vital Signs BP (!) 178/90 (BP Location: Left Arm)   Pulse (!) 101   Temp 98.5 F (36.9 C) (Oral)   Ht 5\' 9"  (1.753 m)   Wt (!) 302 lb (137 kg)   SpO2 98%   BMI 44.60 kg/m   Visual Acuity Right Eye Distance:   Left Eye Distance:   Bilateral Distance:    Right Eye Near:   Left Eye Near:    Bilateral Near:     Physical Exam Vitals  signs and nursing note reviewed.  Constitutional:      Appearance: Normal appearance. She is well-developed.  HENT:     Head:  Normocephalic and atraumatic.  Neck:     Musculoskeletal: Normal range of motion.  Cardiovascular:     Rate and Rhythm: Normal rate and regular rhythm.  Pulmonary:     Effort: Pulmonary effort is normal.     Breath sounds: Normal breath sounds.  Abdominal:     General: There is no distension.     Palpations: Abdomen is soft.     Tenderness: There is no abdominal tenderness. There is no right CVA tenderness or left CVA tenderness.       Comments: Left lower abdomen: 2cm area of erythema, induration, tenderness. Centralized scant purulent discharge. No fluctuance.   Musculoskeletal: Normal range of motion.  Skin:    General: Skin is warm and dry.  Neurological:     Mental Status: She is alert and oriented to person, place, and time.  Psychiatric:        Behavior: Behavior normal.      UC Treatments / Results  Labs (all labs ordered are listed, but only abnormal results are displayed) Labs Reviewed  POCT URINALYSIS DIP (MANUAL ENTRY) - Abnormal; Notable for the following components:      Result Value   Clarity, UA cloudy (*)    Blood, UA moderate (*)    Protein Ur, POC >=300 (*)    Leukocytes, UA Moderate (2+) (*)    All other components within normal limits  URINE CULTURE    EKG None  Radiology No results found.  Procedures Procedures (including critical care time)  Medications Ordered in UC Medications - No data to display  Initial Impression / Assessment and Plan / UC Course  I have reviewed the triage vital signs and the nursing notes.  Pertinent labs & imaging results that were available during my care of the patient were reviewed by me and considered in my medical decision making (see chart for details).    UA c/w UTI Culture sent  Sore on skin c/w small abscess but no indication for I&D at this time Will tx with keflex and  mupirocin  Final Clinical Impressions(s) / UC Diagnoses   Final diagnoses:  Dysuria  Acute cystitis with hematuria  Abscess of skin of abdomen     Discharge Instructions      Keep wound clean with warm water and mild soap. You may apply the antibiotic ointment as prescribed.  Please take your antibiotic as prescribed. A urine culture has been sent to check the severity of your urinary infection and to determine if you are on the most appropriate antibiotic. The results should come back within 2-3 days and you will be notified even if no medication change is needed.  Please stay well hydrated and follow up with your family doctor in 1 week if not improving, sooner if worsening.     ED Prescriptions    Medication Sig Dispense Auth. Provider   cephALEXin (KEFLEX) 500 MG capsule Take 1 capsule (500 mg total) by mouth 3 (three) times daily. 21 capsule Waylan Rocher O, PA-C   mupirocin ointment (BACTROBAN) 2 % Apply to wound 3 times daily for 5 days 22 g Lurene Shadow, [place]     Controlled Substance Prescriptions [place] Controlled Substance Registry consulted? Not Applicable   Rolla Plate 05/16/18 1205

## 2018-05-16 NOTE — Discharge Instructions (Signed)
°  Keep wound clean with warm water and mild soap. You may apply the antibiotic ointment as prescribed.  Please take your antibiotic as prescribed. A urine culture has been sent to check the severity of your urinary infection and to determine if you are on the most appropriate antibiotic. The results should come back within 2-3 days and you will be notified even if no medication change is needed.  Please stay well hydrated and follow up with your family doctor in 1 week if not improving, sooner if worsening.

## 2018-05-16 NOTE — ED Triage Notes (Signed)
Patient c/o dysuria x 1 day, pain radiates to lower back, no hematuria, cloudy urine.  Possible boil on abdomen.

## 2018-05-17 LAB — URINE CULTURE
MICRO NUMBER:: 344306
SPECIMEN QUALITY:: ADEQUATE

## 2018-05-18 ENCOUNTER — Telehealth: Payer: Self-pay | Admitting: *Deleted

## 2018-05-18 NOTE — Telephone Encounter (Signed)
LM with Ucx results and to call back if she has any questions or concerns.  

## 2018-06-04 ENCOUNTER — Encounter: Payer: Self-pay | Admitting: Sports Medicine

## 2018-06-11 ENCOUNTER — Ambulatory Visit: Payer: BLUE CROSS/BLUE SHIELD | Admitting: Skilled Nursing Facility1

## 2018-06-17 ENCOUNTER — Encounter: Payer: Self-pay | Admitting: Sports Medicine

## 2018-06-17 NOTE — Telephone Encounter (Signed)
Please set her up for a telephone visit tomorrow.

## 2018-06-17 NOTE — Telephone Encounter (Signed)
Patient is scheduled for 11:15 tomorrow with Dr. Karie Schwalbe

## 2018-06-18 ENCOUNTER — Ambulatory Visit: Payer: BLUE CROSS/BLUE SHIELD | Admitting: Sports Medicine

## 2018-06-20 ENCOUNTER — Other Ambulatory Visit: Payer: Self-pay | Admitting: Sports Medicine

## 2018-06-20 DIAGNOSIS — I10 Essential (primary) hypertension: Secondary | ICD-10-CM

## 2018-06-26 ENCOUNTER — Other Ambulatory Visit: Payer: Self-pay | Admitting: Sports Medicine

## 2018-06-26 DIAGNOSIS — M7918 Myalgia, other site: Secondary | ICD-10-CM

## 2018-10-24 ENCOUNTER — Telehealth: Payer: BLUE CROSS/BLUE SHIELD | Admitting: Family

## 2018-10-24 DIAGNOSIS — R300 Dysuria: Secondary | ICD-10-CM

## 2018-10-24 MED ORDER — NITROFURANTOIN MONOHYD MACRO 100 MG PO CAPS: 100.0000 mg | ORAL_CAPSULE | Freq: Two times a day (BID) | ORAL | 0 refills | Status: DC

## 2018-10-24 NOTE — Progress Notes (Signed)

## 2018-11-01 ENCOUNTER — Emergency Department
Admission: EM | Admit: 2018-11-01 | Discharge: 2018-11-01 | Disposition: A | Discharge status: Home / Self Care | Payer: BLUE CROSS/BLUE SHIELD | Source: Home / Self Care

## 2018-11-01 ENCOUNTER — Encounter: Payer: Self-pay | Admitting: Emergency Medicine

## 2018-11-01 ENCOUNTER — Other Ambulatory Visit: Payer: Self-pay

## 2018-11-01 DIAGNOSIS — I10 Essential (primary) hypertension: Secondary | ICD-10-CM

## 2018-11-01 DIAGNOSIS — N3001 Acute cystitis with hematuria: Secondary | ICD-10-CM

## 2018-11-01 LAB — POCT URINALYSIS DIP (MANUAL ENTRY)
Bilirubin, UA: NEGATIVE
Glucose, UA: NEGATIVE mg/dL
Ketones, POC UA: NEGATIVE mg/dL
Nitrite, UA: NEGATIVE
Protein Ur, POC: NEGATIVE mg/dL
Spec Grav, UA: 1.025 (ref 1.010–1.025)
Urobilinogen, UA: 0.2 E.U./dL
pH, UA: 5.5 (ref 5.0–8.0)

## 2018-11-01 MED ORDER — CEPHALEXIN 500 MG PO CAPS: 500.0000 mg | ORAL_CAPSULE | Freq: Two times a day (BID) | ORAL | 0 refills | Status: DC

## 2018-11-01 NOTE — ED Provider Notes (Signed)
[facility]    CSN: [id number] Arrival date & time: 11/01/18  1127      History   Chief Complaint Chief Complaint  Patient presents with  . Urinary Tract Infection    HPI Chelsea [name] is a 33 y.o. female.   HPI Chelsea [name] is a 33 y.o. female presenting to UC with c/o recurrent urinary symptoms of burning, frequency and cloudy urine the last 2 days. She completed a E-visit about 2 weeks ago and was prescribed macrobid. She completed the 5 day course and did feel better until recently.  Denies fever, chills, n/v/d.  BP elevated in triage. Hx of HTN but she is not currently prescribed medication.  She had gastric bypass surgery about 1 year ago and lost about 70 pounds. She had to come off the BP medication due to he BP being too low.     Past Medical History:  Diagnosis Date  . Depression   . Endometrial polyp   . Fibromyalgia   . History of recurrent UTIs   . Hypertension   . Infertility, anovulation   . Myofascial muscle pain   . OA (osteoarthritis) of knee    BILATERAL  . PCOS (polycystic ovarian syndrome)   . PONV (postoperative nausea and vomiting)   . Seasonal allergic rhinitis     Patient Active Problem List   Diagnosis Date Noted  . Diet-controlled diabetes mellitus (HCC) 04/25/2017  . Subacromial bursitis of left shoulder joint 04/25/2016  . Low back pain 08/24/2015  . Annual physical exam 03/23/2015  . Intractable migraine without aura and with status migrainosus 06/24/2014  . Myofascial pain syndrome with depression 02/08/2014  . Venous stasis dermatitis 01/25/2014  . Right hip pain 12/13/2013  . Metatarsalgia of both feet 10/18/2013  . Chondromalacia of patellofemoral joint 02/13/2012  . Essential hypertension, benign 11/25/2008  . POLYCYSTIC OVARIAN DISEASE 10/26/2008  . OBESITY, CLASS III 10/26/2008    Past Surgical History:  Procedure Laterality Date  . HYSTEROSCOPY W/D&C N/A 09/07/2014   Procedure:  DILATATION AND  CURETTAGE /HYSTEROSCOPY/POLYPECTOMY;  Surgeon: Fermin Schwab, MD;  Location: [facility];  Service: Gynecology;  Laterality: N/A;  . I & D RIGHT FOOT  1998  . LAPAROSCOPIC GASTRIC SLEEVE RESECTION N/A 10/20/2017   Procedure: LAPAROSCOPIC GASTRIC SLEEVE RESECTION, UPPER ENDO, ERAS Pathway;  Surgeon: Berna Bue, MD;  Location: [facility];  Service: General;  Laterality: N/A;  . POLYPECTOMY     uterine  . TONSILLECTOMY  2001  . WISDOM TOOTH EXTRACTION  2004    OB History    Gravida  0   Para  0   Term  0   Preterm  0   AB  0   Living  0     SAB  0   TAB  0   Ectopic  0   Multiple  0   Live Births               Home Medications    Prior to Admission medications   Medication Sig Start Date End Date Taking? Authorizing Provider  amLODipine (NORVASC) 10 MG tablet TAKE 1 TABLET BY MOUTH EVERY DAY 06/20/18   Monica Becton, MD  cephALEXin (KEFLEX) 500 MG capsule Take 1 capsule (500 mg total) by mouth 2 (two) times daily. 11/01/18   Lurene Shadow, PA-C  DULoxetine (CYMBALTA) 60 MG capsule TAKE 2 CAPSULES BY MOUTH DAILY. 06/26/18   Monica Becton, MD  ipratropium (ATROVENT) 0.06 %  nasal spray Place 2 sprays into both nostrils 4 (four) times daily. 02/25/18   Lurene ShadowPhelps, Evonte Prestage O, PA-C  mupirocin ointment (BACTROBAN) 2 % Apply to wound 3 times daily for 5 days 05/16/18   Lurene ShadowPhelps, Doron Shake O, PA-C  nitrofurantoin, macrocrystal-monohydrate, (MACROBID) 100 MG capsule Take 1 capsule (100 mg total) by mouth 2 (two) times daily. 10/24/18   Eulis FosterWebb, Padonda B, FNP  pantoprazole (PROTONIX) 40 MG tablet Take 40 mg by mouth daily. 09/25/17   [provider]  valACYclovir (VALTREX) 1000 MG tablet Take 1 tablet (1,000 mg total) by mouth 2 (two) times daily. Patient taking differently: Take 1,000 mg by mouth 2 (two) times daily as needed (for cold sores.).  01/02/16   Rodolph Bongorey, Evan S, MD    Family History Family History  Problem Relation Age of Onset  . Diabetes  Mother   . Polycystic ovary syndrome Mother   . Hypertension Father   . Diabetes Father   . Fibromyalgia Maternal Aunt   . Fibromyalgia Cousin   . Cancer Paternal Grandfather        prostate  . Glaucoma Paternal Grandmother     Social History Social History   Tobacco Use  . Smoking status: Never Smoker  . Smokeless tobacco: Never Used  Substance Use Topics  . Alcohol use: No    Alcohol/week: 0.0 standard drinks  . Drug use: No     Allergies   Enalapril maleate, Sulfa antibiotics, Amoxicillin, Erythromycin, and Penicillins   Review of Systems Review of Systems  Constitutional: Negative for chills and fever.  Cardiovascular: Negative for chest pain, palpitations and leg swelling.  Genitourinary: Positive for dysuria, frequency and urgency. Negative for decreased urine volume, flank pain, hematuria, pelvic pain, vaginal bleeding and vaginal discharge.  Musculoskeletal: Negative for back pain and myalgias.  Neurological: Negative for dizziness, light-headedness and headaches.     Physical Exam Triage Vital Signs ED Triage Vitals  Enc Vitals Group     BP 11/01/18 1147 (!) 173/115     Pulse Rate 11/01/18 1147 82     Resp 11/01/18 1147 16     Temp 11/01/18 1147 98 F (36.7 C)     Temp Source 11/01/18 1147 Oral     SpO2 11/01/18 1147 99 %     Weight 11/01/18 1147 (!) 303 lb (137.4 kg)     Height 11/01/18 1147 5\' 10"  (1.778 m)     Head Circumference --      Peak Flow --      Pain Score 11/01/18 1151 5     Pain Loc --      Pain Edu? --      Excl. in GC? --    No data found.  Updated Vital Signs BP (!) 152/116 (BP Location: Right Wrist) Comment: pressure recheck per provider  Pulse 82   Temp 98 F (36.7 C) (Oral)   Resp 16   Ht 5\' 10"  (1.778 m)   Wt (!) 303 lb (137.4 kg)   SpO2 99%   BMI 43.48 kg/m    Physical Exam Vitals signs and nursing note reviewed.  Constitutional:      Appearance: Normal appearance. She is well-developed.  HENT:     Head:  Normocephalic and atraumatic.     Mouth/Throat:     Mouth: Mucous membranes are moist.  Neck:     Musculoskeletal: Normal range of motion.  Cardiovascular:     Rate and Rhythm: Normal rate and regular rhythm.  Pulmonary:  Effort: Pulmonary effort is normal.     Breath sounds: Normal breath sounds.  Abdominal:     General: There is no distension.     Palpations: Abdomen is soft.     Tenderness: There is no abdominal tenderness. There is no right CVA tenderness or left CVA tenderness.  Musculoskeletal: Normal range of motion.  Skin:    General: Skin is warm and dry.     Capillary Refill: Capillary refill takes less than 2 seconds.  Neurological:     Mental Status: She is alert and oriented to person, place, and time.  Psychiatric:        Behavior: Behavior normal.      UC Treatments / Results  Labs (all labs ordered are listed, but only abnormal results are displayed) Labs Reviewed  POCT URINALYSIS DIP (MANUAL ENTRY) - Abnormal; Notable for the following components:      Result Value   Color, UA other (*)    Clarity, UA cloudy (*)    Blood, UA trace-lysed (*)    Leukocytes, UA Small (1+) (*)    All other components within normal limits  URINE CULTURE    EKG   Radiology No results found.  Procedures Procedures (including critical care time)  Medications Ordered in UC Medications - No data to display  Initial Impression / Assessment and Plan / UC Course  I have reviewed the triage vital signs and the nursing notes.  Pertinent labs & imaging results that were available during my care of the patient were reviewed by me and considered in my medical decision making (see chart for details).     UA c/w UTI Culture sent  Reviewed PMH, pt has done well with keflex in the past.  BP still elevated on recheck, encouraged to monitor at home and f/u with PCP this week, she will likely need to restart BP medication.  Final Clinical Impressions(s) / UC Diagnoses    Final diagnoses:  Acute cystitis with hematuria  Uncontrolled hypertension     Discharge Instructions      Please take your antibiotic as prescribed. A urine culture has been sent to check the severity of your urinary infection and to determine if you are on the most appropriate antibiotic. The results should come back within 2-3 days and you will be notified if a medication change is needed.  Please stay well hydrated and follow up with your family doctor in 1 week if not improving, sooner if worsening.  Be sure to also follow up with your family doctor later this week for recheck of blood pressure as you may need to be restarted on your blood pressure medication if it remains high. Try to monitor at home before going to your next appointment and bring your readings to that appointment.      ED Prescriptions    Medication Sig Dispense Auth. Provider   cephALEXin (KEFLEX) 500 MG capsule Take 1 capsule (500 mg total) by mouth 2 (two) times daily. 14 capsule Noe Gens, PA-C     Controlled Substance Prescriptions [place] Controlled Substance Registry consulted? Not Applicable   Tyrell Antonio 11/01/18 1315

## 2018-11-01 NOTE — Discharge Instructions (Addendum)
°  Please take your antibiotic as prescribed. A urine culture has been sent to check the severity of your urinary infection and to determine if you are on the most appropriate antibiotic. The results should come back within 2-3 days and you will be notified if a medication change is needed.  Please stay well hydrated and follow up with your family doctor in 1 week if not improving, sooner if worsening.  Be sure to also follow up with your family doctor later this week for recheck of blood pressure as you may need to be restarted on your blood pressure medication if it remains high. Try to monitor at home before going to your next appointment and bring your readings to that appointment.

## 2018-11-01 NOTE — ED Triage Notes (Signed)
Patient had an evisit 2 weeks ago given 5 day of Macrobid, sx's resolved, now back after completing antbs, having cloudy urine, frequency and dysuria.

## 2018-11-04 ENCOUNTER — Other Ambulatory Visit: Payer: Self-pay

## 2018-11-04 ENCOUNTER — Encounter: Payer: Self-pay | Admitting: Sports Medicine

## 2018-11-04 ENCOUNTER — Ambulatory Visit (INDEPENDENT_AMBULATORY_CARE_PROVIDER_SITE_OTHER): Payer: BC Managed Care – PPO | Admitting: Sports Medicine

## 2018-11-04 DIAGNOSIS — M7918 Myalgia, other site: Secondary | ICD-10-CM

## 2018-11-04 DIAGNOSIS — I10 Essential (primary) hypertension: Secondary | ICD-10-CM

## 2018-11-04 DIAGNOSIS — Z9884 Bariatric surgery status: Secondary | ICD-10-CM | POA: Diagnosis not present

## 2018-11-04 LAB — URINE CULTURE
MICRO NUMBER:: 854261
SPECIMEN QUALITY:: ADEQUATE

## 2018-11-04 MED ORDER — VALSARTAN 320 MG PO TABS: 320.0000 mg | ORAL_TABLET | Freq: Every day | ORAL | 3 refills | Status: DC

## 2018-11-04 MED ORDER — FLUOXETINE HCL 20 MG PO TABS: 20.0000 mg | ORAL_TABLET | Freq: Every day | ORAL | 3 refills | Status: DC

## 2018-11-04 MED ORDER — DULOXETINE HCL 30 MG PO CPEP: ORAL_CAPSULE | ORAL | 0 refills | Status: DC

## 2018-11-04 NOTE — Assessment & Plan Note (Signed)
Blood pressure has increased, restarting valsartan. Holding off on amlodipine for now. She will return in a week, we may add a thiazide if blood pressure is still not in control.

## 2018-11-04 NOTE — Assessment & Plan Note (Signed)
Waning efficacy with Cymbalta. We will down taper her Cymbalta and start Prozac.   Return in a month for this.

## 2018-11-04 NOTE — Assessment & Plan Note (Signed)
Did fantastic, approximately 50 pound weight loss. She is noticing a slowing and weight loss so we will set her up with a nutritionist here in [place]. Ultimate goal is to get her weight down low enough where she and her wife can proceed with in vitro.

## 2018-11-04 NOTE — Progress Notes (Signed)
Subjective:    CC: Follow-up  HPI: Hypertension: Blood pressure has elevated again, she does have a bit of occasional blurry vision.  We had taken her off of her medications as she was having episodes of hypotension, she had recently had bariatric surgery, gastric sleeve and was on a liquid diet.  Weight gain: History of gastric sleeve, she gained about 7 pounds, 50 pound total weight loss however.  Depression: Waning efficacy with Cymbalta, no suicidal or homicidal ideation.  I reviewed the past medical history, family history, social history, surgical history, and allergies today and no changes were needed.  Please see the problem list section below in epic for further details.  Past Medical History: Past Medical History:  Diagnosis Date  . Depression   . Endometrial polyp   . Fibromyalgia   . History of recurrent UTIs   . Hypertension   . Infertility, anovulation   . Myofascial muscle pain   . OA (osteoarthritis) of knee    BILATERAL  . PCOS (polycystic ovarian syndrome)   . PONV (postoperative nausea and vomiting)   . Seasonal allergic rhinitis    Past Surgical History: Past Surgical History:  Procedure Laterality Date  . HYSTEROSCOPY W/D&C N/A 09/07/2014   Procedure:  DILATATION AND CURETTAGE /HYSTEROSCOPY/POLYPECTOMY;  Surgeon: Governor Specking, MD;  Location: [place];  Service: Gynecology;  Laterality: N/A;  . I & D RIGHT FOOT  1998  . LAPAROSCOPIC GASTRIC SLEEVE RESECTION N/A 10/20/2017   Procedure: LAPAROSCOPIC GASTRIC SLEEVE RESECTION, UPPER ENDO, ERAS Pathway;  Surgeon: Clovis Riley, MD;  Location: [facility];  Service: General;  Laterality: N/A;  . POLYPECTOMY     uterine  . TONSILLECTOMY  2001  . WISDOM TOOTH EXTRACTION  2004   Social History: Social History   Socioeconomic History  . Marital status: Married    Spouse name: Not on file  . Number of children: Not on file  . Years of education: Not on file  . Highest education level:  Not on file  Occupational History  . Occupation: Nurse, mental health  Social Needs  . Financial resource strain: Not on file  . Food insecurity    Worry: Not on file    Inability: Not on file  . Transportation needs    Medical: Not on file    Non-medical: Not on file  Tobacco Use  . Smoking status: Never Smoker  . Smokeless tobacco: Never Used  Substance and Sexual Activity  . Alcohol use: No    Alcohol/week: 0.0 standard drinks  . Drug use: No  . Sexual activity: Not Currently    Partners: Female  Lifestyle  . Physical activity    Days per week: Not on file    Minutes per session: Not on file  . Stress: Not on file  Relationships  . Social Herbalist on phone: Not on file    Gets together: Not on file    Attends religious service: Not on file    Active member of club or organization: Not on file    Attends meetings of clubs or organizations: Not on file    Relationship status: Not on file  Other Topics Concern  . Not on file  Social History Narrative  . Not on file   Family History: Family History  Problem Relation Age of Onset  . Diabetes Mother   . Polycystic ovary syndrome Mother   . Hypertension Father   . Diabetes Father   . Fibromyalgia Maternal Aunt   .  Fibromyalgia Cousin   . Cancer Paternal Grandfather        prostate  . Glaucoma Paternal Grandmother    Allergies: Allergies  Allergen Reactions  . Enalapril Maleate Swelling    lip swelling  . Sulfa Antibiotics Other (See Comments)    Unsure of reaction type  . Amoxicillin Other (See Comments)    Caused yeast infection Has patient had a PCN reaction causing immediate rash, facial/tongue/throat swelling, SOB or lightheadedness with hypotension: Unknown Has patient had a PCN reaction causing severe rash involving mucus membranes or skin necrosis: No Has patient had a PCN reaction that required hospitalization: No Has patient had a PCN reaction occurring within the last 10 years: No Childhood  allergy If all of the above answers are "NO", then may proceed with Cephalosporin use.   . Erythromycin Diarrhea  . Penicillins Other (See Comments)    Caused yeast infection Has patient had a PCN reaction causing immediate rash, facial/tongue/throat swelling, SOB or lightheadedness with hypotension: Unknown Has patient had a PCN reaction causing severe rash involving mucus membranes or skin necrosis: No Has patient had a PCN reaction that required hospitalization: No Has patient had a PCN reaction occurring within the last 10 years: No Childhood allergy If all of the above answers are "NO", then may proceed with Cephalosporin use.     Medications: See med rec.  Review of Systems: No fevers, chills, night sweats, weight loss, chest pain, or shortness of breath.   Objective:    General: Well Developed, well nourished, and in no acute distress.  Neuro: Alert and oriented x3, extra-ocular muscles intact, sensation grossly intact.  HEENT: Normocephalic, atraumatic, pupils equal round reactive to light, neck supple, no masses, no lymphadenopathy, thyroid nonpalpable.  Skin: Warm and dry, no rashes. Cardiac: Regular rate and rhythm, no murmurs rubs or gallops, no lower extremity edema.  Respiratory: Clear to auscultation bilaterally. Not using accessory muscles, speaking in full sentences.  Impression and Recommendations:    Essential hypertension, benign Blood pressure has increased, restarting valsartan. Holding off on amlodipine for now. She will return in a week, we may add a thiazide if blood pressure is still not in control.  History of gastric sleeve Did fantastic, approximately 50 pound weight loss. She is noticing a slowing and weight loss so we will [place]. Ultimate goal is to get her weight down low enough where she and her wife can proceed with in vitro.  Myofascial pain syndrome with depression Waning efficacy with Cymbalta.  We will down taper her Cymbalta and start Prozac.   Return in a month for this.   ___________________________________________ Ihor Austinhomas J. Benjamin Stainhekkekandam, M.D., ABFM., CAQSM. Primary Care and Sports Medicine [facility]  Adjunct Professor of Family Medicine  [employer]

## 2018-11-05 ENCOUNTER — Telehealth (HOSPITAL_COMMUNITY): Payer: Self-pay | Admitting: Emergency Medicine

## 2018-11-05 NOTE — Telephone Encounter (Signed)
Urine culture was positive for Proteus mirabilis and was given keflex  at urgent care visit. Attempted to reach patient. No answer at this time.

## 2018-11-11 ENCOUNTER — Encounter: Payer: Self-pay | Admitting: Sports Medicine

## 2018-11-11 ENCOUNTER — Ambulatory Visit (INDEPENDENT_AMBULATORY_CARE_PROVIDER_SITE_OTHER): Payer: BC Managed Care – PPO | Admitting: Sports Medicine

## 2018-11-11 ENCOUNTER — Other Ambulatory Visit: Payer: Self-pay

## 2018-11-11 DIAGNOSIS — G4453 Primary thunderclap headache: Secondary | ICD-10-CM

## 2018-11-11 DIAGNOSIS — E782 Mixed hyperlipidemia: Secondary | ICD-10-CM

## 2018-11-11 DIAGNOSIS — M7918 Myalgia, other site: Secondary | ICD-10-CM

## 2018-11-11 DIAGNOSIS — I10 Essential (primary) hypertension: Secondary | ICD-10-CM | POA: Diagnosis not present

## 2018-11-11 DIAGNOSIS — G4482 Headache associated with sexual activity: Secondary | ICD-10-CM

## 2018-11-11 NOTE — Assessment & Plan Note (Addendum)
These typically occur in clusters, she has had them for about 2 weeks now. In general they resolve on their own however it is prudent to go ahead and proceed with an MRI of the brain with and without contrast. She will need labs beforehand.

## 2018-11-11 NOTE — Assessment & Plan Note (Signed)
Blood pressure is now completely controlled with valsartan. We held off amlodipine and held off thiazides, no changes for now.

## 2018-11-11 NOTE — Assessment & Plan Note (Signed)
Cymbalta had waning efficacy. We switched to Prozac and she is already starting to feel improvements.

## 2018-11-11 NOTE — Progress Notes (Addendum)
Subjective:    CC: Follow-up  HPI: Hypertension: Now well controlled.  Orgasmic headaches: Have occurred a few times now over the past 2 weeks.  No lasting symptoms.  She does have a history of migraines.  Mood disorder: Improving already with Prozac.  I reviewed the past medical history, family history, social history, surgical history, and allergies today and no changes were needed.  Please see the problem list section below in epic for further details.  Past Medical History: Past Medical History:  Diagnosis Date  . Depression   . Endometrial polyp   . Fibromyalgia   . History of recurrent UTIs   . Hypertension   . Infertility, anovulation   . Myofascial muscle pain   . OA (osteoarthritis) of knee    BILATERAL  . PCOS (polycystic ovarian syndrome)   . PONV (postoperative nausea and vomiting)   . Seasonal allergic rhinitis    Past Surgical History: Past Surgical History:  Procedure Laterality Date  . HYSTEROSCOPY W/D&C N/A 09/07/2014   Procedure:  DILATATION AND CURETTAGE /HYSTEROSCOPY/POLYPECTOMY;  Surgeon: Governor Specking, MD;  Location: [place];  Service: Gynecology;  Laterality: N/A;  . I & D RIGHT FOOT  1998  . LAPAROSCOPIC GASTRIC SLEEVE RESECTION N/A 10/20/2017   Procedure: LAPAROSCOPIC GASTRIC SLEEVE RESECTION, UPPER ENDO, ERAS Pathway;  Surgeon: Clovis Riley, MD;  Location: [facility];  Service: General;  Laterality: N/A;  . POLYPECTOMY     uterine  . TONSILLECTOMY  2001  . WISDOM TOOTH EXTRACTION  2004   Social History: Social History   Socioeconomic History  . Marital status: Married    Spouse name: Not on file  . Number of children: Not on file  . Years of education: Not on file  . Highest education level: Not on file  Occupational History  . Occupation: Nurse, mental health  Social Needs  . Financial resource strain: Not on file  . Food insecurity    Worry: Not on file    Inability: Not on file  . Transportation needs    Medical:  Not on file    Non-medical: Not on file  Tobacco Use  . Smoking status: Never Smoker  . Smokeless tobacco: Never Used  Substance and Sexual Activity  . Alcohol use: No    Alcohol/week: 0.0 standard drinks  . Drug use: No  . Sexual activity: Not Currently    Partners: Female  Lifestyle  . Physical activity    Days per week: Not on file    Minutes per session: Not on file  . Stress: Not on file  Relationships  . Social Herbalist on phone: Not on file    Gets together: Not on file    Attends religious service: Not on file    Active member of club or organization: Not on file    Attends meetings of clubs or organizations: Not on file    Relationship status: Not on file  Other Topics Concern  . Not on file  Social History Narrative  . Not on file   Family History: Family History  Problem Relation Age of Onset  . Diabetes Mother   . Polycystic ovary syndrome Mother   . Hypertension Father   . Diabetes Father   . Fibromyalgia Maternal Aunt   . Fibromyalgia Cousin   . Cancer Paternal Grandfather        prostate  . Glaucoma Paternal Grandmother    Allergies: Allergies  Allergen Reactions  . Enalapril Maleate Swelling  lip swelling  . Sulfa Antibiotics Other (See Comments)    Unsure of reaction type  . Amoxicillin Other (See Comments)    Caused yeast infection Has patient had a PCN reaction causing immediate rash, facial/tongue/throat swelling, SOB or lightheadedness with hypotension: Unknown Has patient had a PCN reaction causing severe rash involving mucus membranes or skin necrosis: No Has patient had a PCN reaction that required hospitalization: No Has patient had a PCN reaction occurring within the last 10 years: No Childhood allergy If all of the above answers are "NO", then may proceed with Cephalosporin use.   . Erythromycin Diarrhea  . Penicillins Other (See Comments)    Caused yeast infection Has patient had a PCN reaction causing immediate  rash, facial/tongue/throat swelling, SOB or lightheadedness with hypotension: Unknown Has patient had a PCN reaction causing severe rash involving mucus membranes or skin necrosis: No Has patient had a PCN reaction that required hospitalization: No Has patient had a PCN reaction occurring within the last 10 years: No Childhood allergy If all of the above answers are "NO", then may proceed with Cephalosporin use.     Medications: See med rec.  Review of Systems: No fevers, chills, night sweats, weight loss, chest pain, or shortness of breath.   Objective:    General: Well Developed, well nourished, and in no acute distress.  Neuro: Alert and oriented x3, extra-ocular muscles intact, sensation grossly intact.  HEENT: Normocephalic, atraumatic, pupils equal round reactive to light, neck supple, no masses, no lymphadenopathy, thyroid nonpalpable.  Skin: Warm and dry, no rashes. Cardiac: Regular rate and rhythm, no murmurs rubs or gallops, no lower extremity edema.  Respiratory: Clear to auscultation bilaterally. Not using accessory muscles, speaking in full sentences.  Impression and Recommendations:    Essential hypertension, benign Blood pressure is now completely controlled with valsartan. We held off amlodipine and held off thiazides, no changes for now.  Orgasmic headache These typically occur in clusters, she has had them for about 2 weeks now. In general they resolve on their own however it is prudent to go ahead and proceed with an MRI of the brain with and without contrast. She will need labs beforehand.  Myofascial pain syndrome with depression Cymbalta had waning efficacy. We switched to Prozac and she is already starting to feel improvements.  Mixed hypercholesterolemia and hypertriglyceridemia Low-fat diet, she will let me know if she is agreeable to start fish oil supplementation as well.   ___________________________________________ Ihor Austinhomas J. Benjamin Stainhekkekandam, M.D.,  ABFM., CAQSM. Primary Care and Sports Medicine [facility]  Adjunct Professor of Family Medicine  [employer]

## 2018-11-12 DIAGNOSIS — E559 Vitamin D deficiency, unspecified: Secondary | ICD-10-CM | POA: Diagnosis not present

## 2018-11-12 DIAGNOSIS — I10 Essential (primary) hypertension: Secondary | ICD-10-CM | POA: Diagnosis not present

## 2018-11-13 DIAGNOSIS — E782 Mixed hyperlipidemia: Secondary | ICD-10-CM | POA: Insufficient documentation

## 2018-11-13 LAB — COMPLETE METABOLIC PANEL WITH GFR
AG Ratio: 1.4 (calc) (ref 1.0–2.5)
ALT: 28 U/L (ref 6–29)
AST: 19 U/L (ref 10–30)
Albumin: 3.6 g/dL (ref 3.6–5.1)
Alkaline phosphatase (APISO): 106 U/L (ref 31–125)
BUN: 13 mg/dL (ref 7–25)
CO2: 30 mmol/L (ref 20–32)
Calcium: 8.9 mg/dL (ref 8.6–10.2)
Chloride: 102 mmol/L (ref 98–110)
Creat: 0.72 mg/dL (ref 0.50–1.10)
GFR, Est African American: 130 mL/min/{1.73_m2} (ref >=60)
GFR, Est Non African American: 112 mL/min/{1.73_m2} (ref >=60)
Globulin: 2.6 g/dL (calc) (ref 1.9–3.7)
Glucose, Bld: 100 mg/dL — ABNORMAL HIGH (ref 65–99)
Potassium: 3.8 mmol/L (ref 3.5–5.3)
Sodium: 140 mmol/L (ref 135–146)
Total Bilirubin: 0.5 mg/dL (ref 0.2–1.2)
Total Protein: 6.2 g/dL (ref 6.1–8.1)

## 2018-11-13 LAB — CBC
HCT: 42.4 % (ref 35.0–45.0)
Hemoglobin: 14.1 g/dL (ref 11.7–15.5)
MCH: 27.8 pg (ref 27.0–33.0)
MCHC: 33.3 g/dL (ref 32.0–36.0)
MCV: 83.5 fL (ref 80.0–100.0)
MPV: 11.1 fL (ref 7.5–12.5)
Platelets: 223 10*3/uL (ref 140–400)
RBC: 5.08 10*6/uL (ref 3.80–5.10)
RDW: 13.1 % (ref 11.0–15.0)
WBC: 9.6 10*3/uL (ref 3.8–10.8)

## 2018-11-13 LAB — LIPID PANEL W/REFLEX DIRECT LDL
Cholesterol: 190 mg/dL (ref <200)
HDL: 34 mg/dL — ABNORMAL LOW (ref >=50)
LDL Cholesterol (Calc): 125 mg/dL (calc) — ABNORMAL HIGH
Non-HDL Cholesterol (Calc): 156 mg/dL (calc) — ABNORMAL HIGH (ref <130)
Total CHOL/HDL Ratio: 5.6 (calc) — ABNORMAL HIGH (ref <5.0)
Triglycerides: 192 mg/dL — ABNORMAL HIGH (ref <150)

## 2018-11-13 LAB — HEMOGLOBIN A1C
Hgb A1c MFr Bld: 5.5 % of total Hgb (ref <5.7)
Mean Plasma Glucose: 111 (calc)
eAG (mmol/L): 6.2 (calc)

## 2018-11-13 LAB — VITAMIN D 25 HYDROXY (VIT D DEFICIENCY, FRACTURES): Vit D, 25-Hydroxy: 20 ng/mL — ABNORMAL LOW (ref 30–100)

## 2018-11-13 LAB — TSH: TSH: 2.56 mIU/L

## 2018-11-13 MED ORDER — VITAMIN D (ERGOCALCIFEROL) 1.25 MG (50000 UNIT) PO CAPS: 50000.0000 [IU] | ORAL_CAPSULE | ORAL | 0 refills | Status: DC

## 2018-11-13 NOTE — Addendum Note (Signed)
Addended by: Silverio Decamp on: 11/13/2018 12:13 PM   Modules accepted: Orders

## 2018-11-13 NOTE — Assessment & Plan Note (Signed)
Low-fat diet, she will let me know if she is agreeable to start fish oil supplementation as well.

## 2018-11-26 ENCOUNTER — Other Ambulatory Visit: Payer: Self-pay | Admitting: Sports Medicine

## 2018-11-26 DIAGNOSIS — M7918 Myalgia, other site: Secondary | ICD-10-CM

## 2018-12-09 ENCOUNTER — Encounter: Payer: Self-pay | Admitting: Sports Medicine

## 2018-12-09 ENCOUNTER — Other Ambulatory Visit: Payer: Self-pay

## 2018-12-09 ENCOUNTER — Ambulatory Visit (INDEPENDENT_AMBULATORY_CARE_PROVIDER_SITE_OTHER): Payer: BC Managed Care – PPO | Admitting: Sports Medicine

## 2018-12-09 DIAGNOSIS — I10 Essential (primary) hypertension: Secondary | ICD-10-CM | POA: Diagnosis not present

## 2018-12-09 DIAGNOSIS — G4482 Headache associated with sexual activity: Secondary | ICD-10-CM

## 2018-12-09 DIAGNOSIS — M7918 Myalgia, other site: Secondary | ICD-10-CM | POA: Diagnosis not present

## 2018-12-09 MED ORDER — ALPRAZOLAM 0.5 MG PO TABS: 0.5000 mg | ORAL_TABLET | Freq: Three times a day (TID) | ORAL | 0 refills | Status: AC | PRN

## 2018-12-09 MED ORDER — CYCLOBENZAPRINE HCL 5 MG PO TABS: ORAL_TABLET | ORAL | 0 refills | Status: DC

## 2018-12-09 NOTE — Assessment & Plan Note (Signed)
These never returned, I think is okay for her to cancel the brain MRI.

## 2018-12-09 NOTE — Assessment & Plan Note (Signed)
Prozac seems to be helping, she is going through an additional adjustment disorder, adding a bit of alprazolam in the meantime. No changes in Prozac dose, we can revisit this in 4 to 6 weeks.

## 2018-12-09 NOTE — Progress Notes (Signed)
Subjective:    CC: Follow-up  HPI: Orgasmic migraines: Resolved.  Never got the brain MRI.  Hypertension: Controlled.  Mood disorder: Doing well on Prozac, she did have a loss of a pet and some sickness in the family but feels as though she is handling it well thinks to the Prozac, no suicidal or homicidal ideation.  I reviewed the past medical history, family history, social history, surgical history, and allergies today and no changes were needed.  Please see the problem list section below in epic for further details.  Past Medical History: Past Medical History:  Diagnosis Date  . Depression   . Endometrial polyp   . Fibromyalgia   . History of recurrent UTIs   . Hypertension   . Infertility, anovulation   . Myofascial muscle pain   . OA (osteoarthritis) of knee    BILATERAL  . PCOS (polycystic ovarian syndrome)   . PONV (postoperative nausea and vomiting)   . Seasonal allergic rhinitis    Past Surgical History: Past Surgical History:  Procedure Laterality Date  . HYSTEROSCOPY W/D&C N/A 09/07/2014   Procedure:  DILATATION AND CURETTAGE /HYSTEROSCOPY/POLYPECTOMY;  Surgeon: Governor Specking, MD;  Location: [place];  Service: Gynecology;  Laterality: N/A;  . I & D RIGHT FOOT  1998  . LAPAROSCOPIC GASTRIC SLEEVE RESECTION N/A 10/20/2017   Procedure: LAPAROSCOPIC GASTRIC SLEEVE RESECTION, UPPER ENDO, ERAS Pathway;  Surgeon: Clovis Riley, MD;  Location: [facility];  Service: General;  Laterality: N/A;  . POLYPECTOMY     uterine  . TONSILLECTOMY  2001  . WISDOM TOOTH EXTRACTION  2004   Social History: Social History   Socioeconomic History  . Marital status: Married    Spouse name: Not on file  . Number of children: Not on file  . Years of education: Not on file  . Highest education level: Not on file  Occupational History  . Occupation: Nurse, mental health  Social Needs  . Financial resource strain: Not on file  . Food insecurity    Worry: Not on file     Inability: Not on file  . Transportation needs    Medical: Not on file    Non-medical: Not on file  Tobacco Use  . Smoking status: Never Smoker  . Smokeless tobacco: Never Used  Substance and Sexual Activity  . Alcohol use: No    Alcohol/week: 0.0 standard drinks  . Drug use: No  . Sexual activity: Not Currently    Partners: Female  Lifestyle  . Physical activity    Days per week: Not on file    Minutes per session: Not on file  . Stress: Not on file  Relationships  . Social Herbalist on phone: Not on file    Gets together: Not on file    Attends religious service: Not on file    Active member of club or organization: Not on file    Attends meetings of clubs or organizations: Not on file    Relationship status: Not on file  Other Topics Concern  . Not on file  Social History Narrative  . Not on file   Family History: Family History  Problem Relation Age of Onset  . Diabetes Mother   . Polycystic ovary syndrome Mother   . Hypertension Father   . Diabetes Father   . Fibromyalgia Maternal Aunt   . Fibromyalgia Cousin   . Cancer Paternal Grandfather        prostate  . Glaucoma Paternal  Grandmother    Allergies: Allergies  Allergen Reactions  . Enalapril Maleate Swelling    lip swelling  . Sulfa Antibiotics Other (See Comments)    Unsure of reaction type  . Amoxicillin Other (See Comments)    Caused yeast infection Has patient had a PCN reaction causing immediate rash, facial/tongue/throat swelling, SOB or lightheadedness with hypotension: Unknown Has patient had a PCN reaction causing severe rash involving mucus membranes or skin necrosis: No Has patient had a PCN reaction that required hospitalization: No Has patient had a PCN reaction occurring within the last 10 years: No Childhood allergy If all of the above answers are "NO", then may proceed with Cephalosporin use.   . Erythromycin Diarrhea  . Penicillins Other (See Comments)    Caused  yeast infection Has patient had a PCN reaction causing immediate rash, facial/tongue/throat swelling, SOB or lightheadedness with hypotension: Unknown Has patient had a PCN reaction causing severe rash involving mucus membranes or skin necrosis: No Has patient had a PCN reaction that required hospitalization: No Has patient had a PCN reaction occurring within the last 10 years: No Childhood allergy If all of the above answers are "NO", then may proceed with Cephalosporin use.     Medications: See med rec.  Review of Systems: No fevers, chills, night sweats, weight loss, chest pain, or shortness of breath.   Objective:    General: Well Developed, well nourished, and in no acute distress.  Neuro: Alert and oriented x3, extra-ocular muscles intact, sensation grossly intact.  HEENT: Normocephalic, atraumatic, pupils equal round reactive to light, neck supple, no masses, no lymphadenopathy, thyroid nonpalpable.  Skin: Warm and dry, no rashes. Cardiac: Regular rate and rhythm, no murmurs rubs or gallops, no lower extremity edema.  Respiratory: Clear to auscultation bilaterally. Not using accessory muscles, speaking in full sentences.  Impression and Recommendations:    Essential hypertension, benign Controlled, no changes.  Myofascial pain syndrome with depression Prozac seems to be helping, she is going through an additional adjustment disorder, adding a bit of alprazolam in the meantime. No changes in Prozac dose, we can revisit this in 4 to 6 weeks.  Orgasmic headache These never returned, I think is okay for her to cancel the brain MRI.   ___________________________________________ [name]. Benjamin Stain, M.D., ABFM., CAQSM. Primary Care and Sports Medicine [facility]  Adjunct Professor of Family Medicine  [employer]

## 2018-12-09 NOTE — Assessment & Plan Note (Signed)
Controlled, no changes. 

## 2019-01-05 ENCOUNTER — Encounter: Payer: Self-pay | Admitting: Sports Medicine

## 2019-01-05 NOTE — Telephone Encounter (Signed)
Booked?  She is already on schedule for tomorrow.

## 2019-01-06 ENCOUNTER — Other Ambulatory Visit: Payer: Self-pay

## 2019-01-06 ENCOUNTER — Encounter: Payer: Self-pay | Admitting: Sports Medicine

## 2019-01-06 ENCOUNTER — Ambulatory Visit (INDEPENDENT_AMBULATORY_CARE_PROVIDER_SITE_OTHER): Payer: BC Managed Care – PPO | Admitting: Sports Medicine

## 2019-01-06 DIAGNOSIS — L03032 Cellulitis of left toe: Secondary | ICD-10-CM

## 2019-01-06 DIAGNOSIS — M7918 Myalgia, other site: Secondary | ICD-10-CM

## 2019-01-06 MED ORDER — DOXYCYCLINE HYCLATE 100 MG PO TABS: 100.0000 mg | ORAL_TABLET | Freq: Two times a day (BID) | ORAL | 0 refills | Status: AC

## 2019-01-06 NOTE — Progress Notes (Signed)
Subjective:    CC: Follow-up  HPI: Anxiety and depression: Much better controlled with Prozac.  Toe infection: Left second, purulent, pain is moderate, persistent, localized without radiation.  I reviewed the past medical history, family history, social history, surgical history, and allergies today and no changes were needed.  Please see the problem list section below in epic for further details.  Past Medical History: Past Medical History:  Diagnosis Date  . Depression   . Endometrial polyp   . Fibromyalgia   . History of recurrent UTIs   . Hypertension   . Infertility, anovulation   . Myofascial muscle pain   . OA (osteoarthritis) of knee    BILATERAL  . PCOS (polycystic ovarian syndrome)   . PONV (postoperative nausea and vomiting)   . Seasonal allergic rhinitis    Past Surgical History: Past Surgical History:  Procedure Laterality Date  . HYSTEROSCOPY W/D&C N/A 09/07/2014   Procedure:  DILATATION AND CURETTAGE /HYSTEROSCOPY/POLYPECTOMY;  Surgeon: Governor Specking, MD;  Location: [place];  Service: Gynecology;  Laterality: N/A;  . I & D RIGHT FOOT  1998  . LAPAROSCOPIC GASTRIC SLEEVE RESECTION N/A 10/20/2017   Procedure: LAPAROSCOPIC GASTRIC SLEEVE RESECTION, UPPER ENDO, ERAS Pathway;  Surgeon: Clovis Riley, MD;  Location: [facility];  Service: General;  Laterality: N/A;  . POLYPECTOMY     uterine  . TONSILLECTOMY  2001  . WISDOM TOOTH EXTRACTION  2004   Social History: Social History   Socioeconomic History  . Marital status: Married    Spouse name: Not on file  . Number of children: Not on file  . Years of education: Not on file  . Highest education level: Not on file  Occupational History  . Occupation: Nurse, mental health  Social Needs  . Financial resource strain: Not on file  . Food insecurity    Worry: Not on file    Inability: Not on file  . Transportation needs    Medical: Not on file    Non-medical: Not on file  Tobacco Use  .  Smoking status: Never Smoker  . Smokeless tobacco: Never Used  Substance and Sexual Activity  . Alcohol use: No    Alcohol/week: 0.0 standard drinks  . Drug use: No  . Sexual activity: Not Currently    Partners: Female  Lifestyle  . Physical activity    Days per week: Not on file    Minutes per session: Not on file  . Stress: Not on file  Relationships  . Social Herbalist on phone: Not on file    Gets together: Not on file    Attends religious service: Not on file    Active member of club or organization: Not on file    Attends meetings of clubs or organizations: Not on file    Relationship status: Not on file  Other Topics Concern  . Not on file  Social History Narrative  . Not on file   Family History: Family History  Problem Relation Age of Onset  . Diabetes Mother   . Polycystic ovary syndrome Mother   . Hypertension Father   . Diabetes Father   . Fibromyalgia Maternal Aunt   . Fibromyalgia Cousin   . Cancer Paternal Grandfather        prostate  . Glaucoma Paternal Grandmother    Allergies: Allergies  Allergen Reactions  . Enalapril Maleate Swelling    lip swelling  . Sulfa Antibiotics Other (See Comments)    Unsure  of reaction type  . Amoxicillin Other (See Comments)    Caused yeast infection Has patient had a PCN reaction causing immediate rash, facial/tongue/throat swelling, SOB or lightheadedness with hypotension: Unknown Has patient had a PCN reaction causing severe rash involving mucus membranes or skin necrosis: No Has patient had a PCN reaction that required hospitalization: No Has patient had a PCN reaction occurring within the last 10 years: No Childhood allergy If all of the above answers are "NO", then may proceed with Cephalosporin use.   . Erythromycin Diarrhea  . Penicillins Other (See Comments)    Caused yeast infection Has patient had a PCN reaction causing immediate rash, facial/tongue/throat swelling, SOB or lightheadedness  with hypotension: Unknown Has patient had a PCN reaction causing severe rash involving mucus membranes or skin necrosis: No Has patient had a PCN reaction that required hospitalization: No Has patient had a PCN reaction occurring within the last 10 years: No Childhood allergy If all of the above answers are "NO", then may proceed with Cephalosporin use.     Medications: See med rec.  Review of Systems: No fevers, chills, night sweats, weight loss, chest pain, or shortness of breath.   Objective:    General: Well Developed, well nourished, and in no acute distress.  Neuro: Alert and oriented x3, extra-ocular muscles intact, sensation grossly intact.  HEENT: Normocephalic, atraumatic, pupils equal round reactive to light, neck supple, no masses, no lymphadenopathy, thyroid nonpalpable.  Skin: Warm and dry, no rashes. Cardiac: Regular rate and rhythm, no murmurs rubs or gallops, no lower extremity edema.  Respiratory: Clear to auscultation bilaterally. Not using accessory muscles, speaking in full sentences. Left foot: Paronychia on the left second toe, culture obtained.  Impression and Recommendations:    Myofascial pain syndrome with depression Improved considerably, no change in plan.  Paronychia of second toe, left Culture obtained, doxycycline for 7 days, return as needed   ___________________________________________ [name]. Benjamin Stain, M.D., ABFM., CAQSM. Primary Care and Sports Medicine [facility] [facility]  Adjunct Professor of Family Medicine  [employer]

## 2019-01-06 NOTE — Assessment & Plan Note (Signed)
Improved considerably, no change in plan.

## 2019-01-06 NOTE — Assessment & Plan Note (Signed)
Culture obtained, doxycycline for 7 days, return as needed

## 2019-01-09 LAB — WOUND CULTURE
MICRO NUMBER:: 1089485
SPECIMEN QUALITY:: ADEQUATE

## 2019-01-10 ENCOUNTER — Other Ambulatory Visit: Payer: Self-pay | Admitting: Sports Medicine

## 2019-01-10 DIAGNOSIS — E782 Mixed hyperlipidemia: Secondary | ICD-10-CM

## 2019-02-03 ENCOUNTER — Other Ambulatory Visit: Payer: Self-pay | Admitting: Sports Medicine

## 2019-02-03 DIAGNOSIS — I10 Essential (primary) hypertension: Secondary | ICD-10-CM

## 2019-03-17 ENCOUNTER — Ambulatory Visit: Payer: BC Managed Care – PPO | Admitting: Sports Medicine

## 2019-03-25 DIAGNOSIS — M7918 Myalgia, other site: Secondary | ICD-10-CM

## 2019-03-26 MED ORDER — FLUOXETINE HCL 20 MG PO CAPS: 20.0000 mg | ORAL_CAPSULE | Freq: Every day | ORAL | 3 refills | Status: DC

## 2019-05-05 ENCOUNTER — Encounter (HOSPITAL_COMMUNITY): Payer: Self-pay

## 2019-05-06 ENCOUNTER — Ambulatory Visit (INDEPENDENT_AMBULATORY_CARE_PROVIDER_SITE_OTHER): Payer: BC Managed Care – PPO | Admitting: Sports Medicine

## 2019-05-06 DIAGNOSIS — Z0000 Encounter for general adult medical examination without abnormal findings: Secondary | ICD-10-CM

## 2019-05-06 DIAGNOSIS — E119 Type 2 diabetes mellitus without complications: Secondary | ICD-10-CM

## 2019-05-06 DIAGNOSIS — M2241 Chondromalacia patellae, right knee: Secondary | ICD-10-CM

## 2019-05-06 NOTE — Assessment & Plan Note (Signed)
Recurrence of knee pain, right-sided, last right knee aspiration and injection was approximately a year ago. Left knee continues to do well, last injected 2.5 years ago.

## 2019-05-06 NOTE — Progress Notes (Signed)
    Procedures performed today:    Procedure: Real-time Ultrasound Guided injection of the right knee Device: Samsung HS60  Verbal informed consent obtained.  Time-out conducted.  Noted no overlying erythema, induration, or other signs of local infection.  Skin prepped in a sterile fashion.  Local anesthesia: Topical Ethyl chloride.  With sterile technique and under real time ultrasound guidance: 1 cc Kenalog 40, 2 cc lidocaine, 2 cc bupivacaine injected easily Completed without difficulty  Pain immediately resolved suggesting accurate placement of the medication.  Advised to call if fevers/chills, erythema, induration, drainage, or persistent bleeding.  Images permanently stored and available for review in the ultrasound unit.  Impression: Technically successful ultrasound guided injection.  Independent interpretation of notes and tests performed by another provider:   None.  Impression and Recommendations:    Chondromalacia of patellofemoral joint Recurrence of knee pain, right-sided, last right knee aspiration and injection was approximately a year ago. Left knee continues to do well, last injected 2.5 years ago.  Annual physical exam Chelsea [name] also needs to come back for an annual physical to get her caught up on her screening measures.    ___________________________________________ [name]. Benjamin Stain, M.D., ABFM., CAQSM. Primary Care and Sports Medicine [place] [facility]  Adjunct Instructor of Family Medicine  [employer]

## 2019-05-06 NOTE — Assessment & Plan Note (Signed)
Chelsea [name] also needs to come back for an annual physical to get her caught up on her screening measures.

## 2019-05-11 ENCOUNTER — Other Ambulatory Visit: Payer: Self-pay

## 2019-05-11 ENCOUNTER — Ambulatory Visit (INDEPENDENT_AMBULATORY_CARE_PROVIDER_SITE_OTHER): Payer: BC Managed Care – PPO | Admitting: Sports Medicine

## 2019-05-11 ENCOUNTER — Encounter: Payer: Self-pay | Admitting: Sports Medicine

## 2019-05-11 VITALS — BP 132/84 | HR 108 | Ht 70.0 in | Wt 316.0 lb

## 2019-05-11 DIAGNOSIS — E119 Type 2 diabetes mellitus without complications: Secondary | ICD-10-CM

## 2019-05-11 DIAGNOSIS — Z23 Encounter for immunization: Secondary | ICD-10-CM

## 2019-05-11 DIAGNOSIS — Z0000 Encounter for general adult medical examination without abnormal findings: Secondary | ICD-10-CM

## 2019-05-11 DIAGNOSIS — I10 Essential (primary) hypertension: Secondary | ICD-10-CM | POA: Diagnosis not present

## 2019-05-11 DIAGNOSIS — F5101 Primary insomnia: Secondary | ICD-10-CM | POA: Diagnosis not present

## 2019-05-11 MED ORDER — BELSOMRA 10 MG PO TABS: 1.0000 | ORAL_TABLET | Freq: Every day | ORAL | 0 refills | Status: DC

## 2019-05-11 NOTE — Progress Notes (Signed)
Subjective:    CC: Annual Physical Exam  HPI:  This patient is here for their annual physical  I reviewed the past medical history, family history, social history, surgical history, and allergies today and no changes were needed.  Please see the problem list section below in epic for further details.  Past Medical History: Past Medical History:  Diagnosis Date  . Depression   . Endometrial polyp   . Fibromyalgia   . History of recurrent UTIs   . Hypertension   . Infertility, anovulation   . Myofascial muscle pain   . OA (osteoarthritis) of knee    BILATERAL  . PCOS (polycystic ovarian syndrome)   . PONV (postoperative nausea and vomiting)   . Seasonal allergic rhinitis    Past Surgical History: Past Surgical History:  Procedure Laterality Date  . HYSTEROSCOPY WITH D & C N/A 09/07/2014   Procedure:  DILATATION AND CURETTAGE /HYSTEROSCOPY/POLYPECTOMY;  Surgeon: Fermin Schwab, MD;  Location: [facility];  Service: Gynecology;  Laterality: N/A;  . I & D RIGHT FOOT  1998  . LAPAROSCOPIC GASTRIC SLEEVE RESECTION N/A 10/20/2017   Procedure: LAPAROSCOPIC GASTRIC SLEEVE RESECTION, UPPER ENDO, ERAS Pathway;  Surgeon: Berna Bue, MD;  Location: [facility];  Service: General;  Laterality: N/A;  . POLYPECTOMY     uterine  . TONSILLECTOMY  2001  . WISDOM TOOTH EXTRACTION  2004   Social History: Social History   Socioeconomic History  . Marital status: Married    Spouse name: Not on file  . Number of children: Not on file  . Years of education: Not on file  . Highest education level: Not on file  Occupational History  . Occupation: distributer  Tobacco Use  . Smoking status: Never Smoker  . Smokeless tobacco: Never Used  Substance and Sexual Activity  . Alcohol use: No    Alcohol/week: 0.0 standard drinks  . Drug use: No  . Sexual activity: Not Currently    Partners: Female  Other Topics Concern  . Not on file  Social History Narrative  . Not on file    Social Determinants of Health   Financial Resource Strain:   . Difficulty of Paying Living Expenses:   Food Insecurity:   . Worried About Programme researcher, broadcasting/film/video in the Last Year:   . Barista in the Last Year:   Transportation Needs:   . Freight forwarder (Medical):   Marland Kitchen Lack of Transportation (Non-Medical):   Physical Activity:   . Days of Exercise per Week:   . Minutes of Exercise per Session:   Stress:   . Feeling of Stress :   Social Connections:   . Frequency of Communication with Friends and Family:   . Frequency of Social Gatherings with Friends and Family:   . Attends Religious Services:   . Active Member of Clubs or Organizations:   . Attends Banker Meetings:   Marland Kitchen Marital Status:    Family History: Family History  Problem Relation Age of Onset  . Diabetes Mother   . Polycystic ovary syndrome Mother   . Hypertension Father   . Diabetes Father   . Fibromyalgia Maternal Aunt   . Fibromyalgia Cousin   . Cancer Paternal Grandfather        prostate  . Glaucoma Paternal Grandmother    Allergies: Allergies  Allergen Reactions  . Enalapril Maleate Swelling    lip swelling  . Sulfa Antibiotics Other (See Comments)  Unsure of reaction type  . Amoxicillin Other (See Comments)    Caused yeast infection Has patient had a PCN reaction causing immediate rash, facial/tongue/throat swelling, SOB or lightheadedness with hypotension: Unknown Has patient had a PCN reaction causing severe rash involving mucus membranes or skin necrosis: No Has patient had a PCN reaction that required hospitalization: No Has patient had a PCN reaction occurring within the last 10 years: No Childhood allergy If all of the above answers are "NO", then may proceed with Cephalosporin use.   . Erythromycin Diarrhea  . Penicillins Other (See Comments)    Caused yeast infection Has patient had a PCN reaction causing immediate rash, facial/tongue/throat swelling, SOB or  lightheadedness with hypotension: Unknown Has patient had a PCN reaction causing severe rash involving mucus membranes or skin necrosis: No Has patient had a PCN reaction that required hospitalization: No Has patient had a PCN reaction occurring within the last 10 years: No Childhood allergy If all of the above answers are "NO", then may proceed with Cephalosporin use.     Medications: See med rec.  Review of Systems: No headache, visual changes, nausea, vomiting, diarrhea, constipation, dizziness, abdominal pain, skin rash, fevers, chills, night sweats, swollen lymph nodes, weight loss, chest pain, body aches, joint swelling, muscle aches, shortness of breath, mood changes, visual or auditory hallucinations.  Objective:    General: Well Developed, well nourished, and in no acute distress.  Neuro: Alert and oriented x3, extra-ocular muscles intact, sensation grossly intact. Cranial nerves II through XII are intact, motor, sensory, and coordinative functions are all intact. HEENT: Normocephalic, atraumatic, pupils equal round reactive to light, neck supple, no masses, no lymphadenopathy, thyroid nonpalpable. Oropharynx, nasopharynx, external ear canals are unremarkable. Skin: Warm and dry, no rashes noted.  Cardiac: Regular rate and rhythm, no murmurs rubs or gallops.  Respiratory: Clear to auscultation bilaterally. Not using accessory muscles, speaking in full sentences.  Abdominal: Soft, nontender, nondistended, positive bowel sounds, no masses, no organomegaly.  Musculoskeletal: Shoulder, elbow, wrist, hip, knee, ankle stable, and with full range of motion.  Impression and Recommendations:    The patient was counselled, risk factors were discussed, anticipatory guidance given.  Annual physical exam Annual physical as above. Patient declines pneumococcal vaccination, she is okay with Tdap today. We did recently check labs back in September so no further labs needed. She is due for  her Pap smear, referral back to OB/GYN.  Diet-controlled diabetes mellitus ([place]) Well-controlled, she has improved considerably with her bariatric surgery. Foot exam performed today and was unremarkable, she is due for her diabetic eye exam.  She plans to get this done shortly.  Essential hypertension, benign Well-controlled, no changes.  Primary insomnia Unclear etiology. Starting Belsomra mid-dose. We can titrate the dose over the next several weeks. I have printed out the free trial voucher Virtual visit in 10 days to reevaluate.   ___________________________________________ Gwen Her. Dianah Field, M.D., ABFM., CAQSM. Primary Care and Sports Medicine [facility]  Adjunct Professor of [employer] [facility] [employer]

## 2019-05-11 NOTE — Assessment & Plan Note (Signed)
Annual physical as above. Patient declines pneumococcal vaccination, she is okay with Tdap today. We did recently check labs back in September so no further labs needed. She is due for her Pap smear, referral back to OB/GYN.

## 2019-05-11 NOTE — Assessment & Plan Note (Signed)
Well-controlled, she has improved considerably with her bariatric surgery. Foot exam performed today and was unremarkable, she is due for her diabetic eye exam.  She plans to get this done shortly.

## 2019-05-11 NOTE — Assessment & Plan Note (Signed)
Unclear etiology. Starting Belsomra mid-dose. We can titrate the dose over the next several weeks. I have printed out the free trial voucher Virtual visit in 10 days to reevaluate.

## 2019-05-11 NOTE — Assessment & Plan Note (Signed)
Well controlled, no changes 

## 2019-05-11 NOTE — Addendum Note (Signed)
Addended by: Donne Anon L on: 05/11/2019 01:59 PM   Modules accepted: Orders

## 2019-05-21 ENCOUNTER — Telehealth: Payer: BC Managed Care – PPO | Admitting: Sports Medicine

## 2019-05-21 ENCOUNTER — Other Ambulatory Visit: Payer: Self-pay

## 2019-05-21 ENCOUNTER — Ambulatory Visit (INDEPENDENT_AMBULATORY_CARE_PROVIDER_SITE_OTHER): Payer: BC Managed Care – PPO | Admitting: Sports Medicine

## 2019-05-21 ENCOUNTER — Encounter: Payer: Self-pay | Admitting: Sports Medicine

## 2019-05-21 DIAGNOSIS — F5101 Primary insomnia: Secondary | ICD-10-CM

## 2019-05-21 MED ORDER — BELSOMRA 20 MG PO TABS: 1.0000 | ORAL_TABLET | Freq: Every day | ORAL | 0 refills | Status: DC

## 2019-05-21 NOTE — Assessment & Plan Note (Signed)
Consepcion returns, we have now tried 5 and 10 mg Belsomra without sufficient improvement, we are going to switch to 20 mg Belsomra with a 10-day free trial voucher that she has already. If this still does not work we will switch to high-dose Ambien.

## 2019-05-21 NOTE — Progress Notes (Signed)
    Procedures performed today:    None.  Independent interpretation of notes and tests performed by another provider:   None.  Brief History, Exam, Impression, and Recommendations:    Primary insomnia Chelsea [name] returns, we have now tried 5 and 10 mg Belsomra without sufficient improvement, we are going to switch to 20 mg Belsomra with a 10-day free trial voucher that she has already. If this still does not work we will switch to high-dose Ambien.    ___________________________________________ Chelsea [name]. Benjamin Stain, M.D., ABFM., CAQSM. Primary Care and Sports Medicine [facility] [facility]  Adjunct Instructor of Family Medicine  [employer]

## 2019-05-31 ENCOUNTER — Telehealth (INDEPENDENT_AMBULATORY_CARE_PROVIDER_SITE_OTHER): Payer: BC Managed Care – PPO | Admitting: Sports Medicine

## 2019-05-31 DIAGNOSIS — F5101 Primary insomnia: Secondary | ICD-10-CM | POA: Diagnosis not present

## 2019-05-31 MED ORDER — ZOLPIDEM TARTRATE 10 MG PO TABS: 10.0000 mg | ORAL_TABLET | Freq: Every evening | ORAL | 0 refills | Status: DC | PRN

## 2019-05-31 NOTE — Progress Notes (Signed)
   Virtual Visit via Telephone   I connected with  Chelsea [name]  on 05/31/19 by telephone/telehealth and verified that I am speaking with the correct person using two identifiers.   I discussed the limitations, risks, security and privacy concerns of performing an evaluation and management service by telephone, including the higher likelihood of inaccurate diagnosis and treatment, and the availability of in person appointments.  We also discussed the likely need of an additional face to face encounter for complete and high quality delivery of care.  I also discussed with the patient that there may be a patient responsible charge related to this service. The patient expressed understanding and wishes to proceed.  Provider location is either at home or medical facility. Patient location is at their home, different from provider location. People involved in care of the patient during this telehealth encounter were myself, my nurse/medical assistant, and my front office/scheduling team member.  Review of Systems: No fevers, chills, night sweats, weight loss, chest pain, or shortness of breath.   Objective Findings:    General: Speaking full sentences, no audible heavy breathing.  Sounds alert and appropriately interactive.    Independent interpretation of tests performed by another provider:   None.  Brief History, Exam, Impression, and Recommendations:    Primary insomnia Chelsea [name] returns, we have been treating her for primary insomnia, she has good sleep hygiene. She tried 5, 10, and 20 mg of Belsomra without any efficacy. Switching to Ambien. We will do 10 days at a time, if she still does not have good relief we will try Lunesta and Sonata afterwards.   I discussed the above assessment and treatment plan with the patient. The patient was provided an opportunity to ask questions and all were answered. The patient agreed with the plan and demonstrated an understanding of the  instructions.   The patient was advised to call back or seek an in-person evaluation if the symptoms worsen or if the condition fails to improve as anticipated.   I provided 30 minutes of face to face and non-face-to-face time during this encounter date, time was needed to gather information, review chart, records, communicate/coordinate with staff remotely, as well as complete documentation.   ___________________________________________ [name]. Benjamin Stain, M.D., ABFM., CAQSM. Primary Care and Sports Medicine [facility]  Adjunct Professor of Family Medicine  [employer]

## 2019-05-31 NOTE — Assessment & Plan Note (Signed)
Chelsea [name] returns, we have been treating her for primary insomnia, she has good sleep hygiene. She tried 5, 10, and 20 mg of Belsomra without any efficacy. Switching to Ambien. We will do 10 days at a time, if she still does not have good relief we will try Lunesta and Sonata afterwards.

## 2019-06-02 ENCOUNTER — Ambulatory Visit (INDEPENDENT_AMBULATORY_CARE_PROVIDER_SITE_OTHER): Payer: BC Managed Care – PPO | Admitting: Obstetrics and Gynecology

## 2019-06-02 ENCOUNTER — Encounter: Payer: Self-pay | Admitting: Obstetrics and Gynecology

## 2019-06-02 ENCOUNTER — Other Ambulatory Visit (HOSPITAL_COMMUNITY)
Admission: RE | Admit: 2019-06-02 | Discharge: 2019-06-02 | Disposition: A | Discharge status: Home / Self Care | Payer: BC Managed Care – PPO | Source: Ambulatory Visit | Attending: Obstetrics and Gynecology | Admitting: Obstetrics and Gynecology

## 2019-06-02 ENCOUNTER — Other Ambulatory Visit: Payer: Self-pay

## 2019-06-02 VITALS — BP 159/81 | HR 90 | Temp 97.5°F | Ht 69.0 in | Wt 321.0 lb

## 2019-06-02 DIAGNOSIS — Z01419 Encounter for gynecological examination (general) (routine) without abnormal findings: Secondary | ICD-10-CM

## 2019-06-02 NOTE — Progress Notes (Signed)
GYNECOLOGY ANNUAL PREVENTATIVE CARE ENCOUNTER NOTE  History:     Chelsea [name] is a 34 y.o. G0P0000 female here for a routine annual gynecologic exam.  Current complaints: None.   Denies abnormal vaginal bleeding, discharge, pelvic pain, problems with intercourse or other gynecologic concerns.  Pronouns: she, lesbian- married.  Desires pregnancy, she is seeing Dr. Kerin Perna for IVF. Plans to do another round when she reaches her weight goal of 250 lbs. Recent gastric sleeve surgery.    Gynecologic History Patient's last menstrual period was 04/14/2019 (approximate). Contraception: none Last Pap: 2017 Results were: normal with negative HPV Last mammogram: NA  Obstetric History OB History  Gravida Para Term Preterm AB Living  0 0 0 0 0 0  SAB TAB Ectopic Multiple Live Births  0 0 0 0      Past Medical History:  Diagnosis Date  . Depression   . Endometrial polyp   . Fibromyalgia   . History of recurrent UTIs   . Hypertension   . Infertility, anovulation   . Myofascial muscle pain   . OA (osteoarthritis) of knee    BILATERAL  . PCOS (polycystic ovarian syndrome)   . PONV (postoperative nausea and vomiting)   . Seasonal allergic rhinitis     Past Surgical History:  Procedure Laterality Date  . HYSTEROSCOPY WITH D & C N/A 09/07/2014   Procedure:  DILATATION AND CURETTAGE /HYSTEROSCOPY/POLYPECTOMY;  Surgeon: Governor Specking, MD;  Location: [place];  Service: Gynecology;  Laterality: N/A;  . I & D RIGHT FOOT  1998  . LAPAROSCOPIC GASTRIC SLEEVE RESECTION N/A 10/20/2017   Procedure: LAPAROSCOPIC GASTRIC SLEEVE RESECTION, UPPER ENDO, ERAS Pathway;  Surgeon: Clovis Riley, MD;  Location: [facility];  Service: General;  Laterality: N/A;  . POLYPECTOMY     uterine  . TONSILLECTOMY  2001  . WISDOM TOOTH EXTRACTION  2004    Current Outpatient Medications on File Prior to Visit  Medication Sig Dispense Refill  . ALPRAZolam (XANAX) 0.5 MG tablet Take 1  tablet (0.5 mg total) by mouth 3 (three) times daily as needed for anxiety. 20 tablet 0  . cyclobenzaprine (FLEXERIL) 5 MG tablet Take 1 or 2 every 8 hours as needed for muscle relaxant. Caution: May cause drowsiness. 90 tablet 0  . FLUoxetine (PROZAC) 20 MG capsule Take 1 capsule (20 mg total) by mouth daily. 30 capsule 3  . valsartan (DIOVAN) 320 MG tablet TAKE 1 TABLET BY MOUTH EVERY DAY 90 tablet 1  . zolpidem (AMBIEN) 10 MG tablet Take 1 tablet (10 mg total) by mouth at bedtime as needed for sleep. 10 tablet 0   No current facility-administered medications on file prior to visit.    Allergies  Allergen Reactions  . Enalapril Maleate Swelling    lip swelling  . Sulfa Antibiotics Other (See Comments)    Unsure of reaction type  . Amoxicillin Other (See Comments)    Caused yeast infection Has patient had a PCN reaction causing immediate rash, facial/tongue/throat swelling, SOB or lightheadedness with hypotension: Unknown Has patient had a PCN reaction causing severe rash involving mucus membranes or skin necrosis: No Has patient had a PCN reaction that required hospitalization: No Has patient had a PCN reaction occurring within the last 10 years: No Childhood allergy If all of the above answers are "NO", then may proceed with Cephalosporin use.   . Erythromycin Diarrhea  . Penicillins Other (See Comments)    Caused yeast infection Has patient had  a PCN reaction causing immediate rash, facial/tongue/throat swelling, SOB or lightheadedness with hypotension: Unknown Has patient had a PCN reaction causing severe rash involving mucus membranes or skin necrosis: No Has patient had a PCN reaction that required hospitalization: No Has patient had a PCN reaction occurring within the last 10 years: No Childhood allergy If all of the above answers are "NO", then may proceed with Cephalosporin use.      Social History:  reports that she has never smoked. She has never used smokeless  tobacco. She reports that she does not drink alcohol or use drugs.  Family History  Problem Relation Age of Onset  . Diabetes Mother   . Polycystic ovary syndrome Mother   . Hypertension Father   . Diabetes Father   . Fibromyalgia Maternal Aunt   . Fibromyalgia Cousin   . Cancer Paternal Grandfather        prostate  . Glaucoma Paternal Grandmother     The following portions of the patient's history were reviewed and updated as appropriate: allergies, current medications, past family history, past medical history, past social history, past surgical history and problem list.  Review of Systems Pertinent items noted in HPI and remainder of comprehensive ROS otherwise negative.  Physical Exam:  BP (!) 159/81   Pulse 90   Temp (!) 97.5 F (36.4 C)   Ht 5\' 9"  (1.753 m)   Wt (!) 321 lb (145.6 kg)   LMP 04/14/2019 (Approximate)   BMI 47.40 kg/m  CONSTITUTIONAL: Well-developed, well-nourished female in no acute distress.  HENT:  Normocephalic, atraumatic, External right and left ear normal. Oropharynx is clear and moist EYES: Conjunctivae and EOM are normal. Pupils are equal, round, and reactive to light. No scleral icterus.  NECK: Normal range of motion, supple, no masses.  Normal thyroid.  SKIN: Skin is warm and dry. No rash noted. Not diaphoretic. No erythema. No pallor. MUSCULOSKELETAL: Normal range of motion. No tenderness.  No cyanosis, clubbing, or edema.  2+ distal pulses. NEUROLOGIC: Alert and oriented to person, place, and time. Normal reflexes, muscle tone coordination.  PSYCHIATRIC: Normal mood and affect. Normal behavior. Normal judgment and thought content. CARDIOVASCULAR: Normal heart rate noted, regular rhythm RESPIRATORY: Clear to auscultation bilaterally. Effort and breath sounds normal, no problems with respiration noted. BREASTS: Symmetric in size. No masses, tenderness, skin changes, nipple drainage, or lymphadenopathy bilaterally. Performed in the presence of a  chaperone. ABDOMEN: Soft, no distention noted.  No tenderness, rebound or guarding.  PELVIC: Normal appearing external genitalia and urethral meatus; normal appearing vaginal mucosa and cervix.  No abnormal discharge noted.  Pap smear obtained.  Normal uterine size, no other palpable masses, no uterine or adnexal tenderness.  Performed in the presence of a chaperone.   Assessment and Plan:   1. Well woman exam  - Cytology - PAP( [place])  Will follow up results of pap smear and manage accordingly. Routine preventative health maintenance measures emphasized. Please refer to After Visit Summary for other counseling recommendations.    Natalin Bible, 04/16/2019, NP [facility], [facility]

## 2019-06-04 LAB — CYTOLOGY - PAP
Comment: NEGATIVE
Diagnosis: NEGATIVE
High risk HPV: NEGATIVE

## 2019-06-28 ENCOUNTER — Other Ambulatory Visit: Payer: Self-pay | Admitting: Sports Medicine

## 2019-06-28 DIAGNOSIS — F5101 Primary insomnia: Secondary | ICD-10-CM

## 2019-07-09 ENCOUNTER — Other Ambulatory Visit: Payer: Self-pay | Admitting: Sports Medicine

## 2019-07-09 DIAGNOSIS — I10 Essential (primary) hypertension: Secondary | ICD-10-CM

## 2019-07-09 DIAGNOSIS — M7918 Myalgia, other site: Secondary | ICD-10-CM

## 2019-07-20 ENCOUNTER — Other Ambulatory Visit: Payer: Self-pay | Admitting: *Deleted

## 2019-07-20 DIAGNOSIS — M7918 Myalgia, other site: Secondary | ICD-10-CM

## 2019-07-20 MED ORDER — FLUOXETINE HCL 20 MG PO CAPS: 20.0000 mg | ORAL_CAPSULE | Freq: Every day | ORAL | 0 refills | Status: DC

## 2019-08-16 ENCOUNTER — Ambulatory Visit: Payer: BC Managed Care – PPO | Admitting: Sports Medicine

## 2019-08-17 ENCOUNTER — Encounter: Payer: Self-pay | Admitting: Sports Medicine

## 2019-08-17 ENCOUNTER — Ambulatory Visit (INDEPENDENT_AMBULATORY_CARE_PROVIDER_SITE_OTHER): Payer: BC Managed Care – PPO | Admitting: Sports Medicine

## 2019-08-17 DIAGNOSIS — M7542 Impingement syndrome of left shoulder: Secondary | ICD-10-CM | POA: Insufficient documentation

## 2019-08-17 DIAGNOSIS — Z0000 Encounter for general adult medical examination without abnormal findings: Secondary | ICD-10-CM

## 2019-08-17 DIAGNOSIS — M7541 Impingement syndrome of right shoulder: Secondary | ICD-10-CM

## 2019-08-17 DIAGNOSIS — M2241 Chondromalacia patellae, right knee: Secondary | ICD-10-CM

## 2019-08-17 NOTE — Assessment & Plan Note (Addendum)
Chelsea [name] returns, she is having worsening of pain in her right knee, we last injected approximately 3 months ago. Repeat injection today. Return as needed. Rehab exercises given. She was having good amount of catching and locking so if the injection does not help we will proceed with MRI.

## 2019-08-17 NOTE — Assessment & Plan Note (Signed)
Chelsea [name] has right shoulder pain localized over the deltoid and worse with abduction. Consistent with impingement syndrome, rotator cuff look good on ultrasound, we did a subacromial injection. She has some left-sided symptoms as well. Adding rehab exercises for both sides, return in 6 weeks.

## 2019-08-17 NOTE — Progress Notes (Addendum)
    Procedures performed today:    Procedure: Real-time Ultrasound Guided injection of the right knee Device: Samsung HS60  Verbal informed consent obtained.  Time-out conducted.  Noted no overlying erythema, induration, or other signs of local infection.  Skin prepped in a sterile fashion.  Local anesthesia: Topical Ethyl chloride.  With sterile technique and under real time ultrasound guidance: 1 cc Kenalog 40, 2 cc lidocaine, 2 cc bupivacaine injected easily Completed without difficulty  Pain immediately resolved suggesting accurate placement of the medication.  Advised to call if fevers/chills, erythema, induration, drainage, or persistent bleeding.  Images permanently stored and available for review in the ultrasound unit.  Impression: Technically successful ultrasound guided injection.  Procedure: Real-time Ultrasound Guided injection of the right subacromial bursa Device: Samsung HS60  Verbal informed consent obtained.  Time-out conducted.  Noted no overlying erythema, induration, or other signs of local infection.  Skin prepped in a sterile fashion.  Local anesthesia: Topical Ethyl chloride.  With sterile technique and under real time ultrasound guidance: 1 cc Kenalog 40, 1 cc lidocaine,1 cc bupivacaine injected easily Completed without difficulty  Pain immediately resolved suggesting accurate placement of the medication.  Advised to call if fevers/chills, erythema, induration, drainage, or persistent bleeding.  Images permanently stored and available for review in the ultrasound unit.  Impression: Technically successful ultrasound guided injection.  Independent interpretation of notes and tests performed by another provider:   None.  Brief History, Exam, Impression, and Recommendations:    Chondromalacia of patellofemoral joint Chelsea [name] returns, she is having worsening of pain in her right knee, we last injected approximately 3 months ago. Repeat injection today. Return  as needed. Rehab exercises given. She was having good amount of catching and locking so if the injection does not help we will proceed with MRI.  Impingement syndrome, shoulder, right Baldo Ash has right shoulder pain localized over the deltoid and worse with abduction. Consistent with impingement syndrome, rotator cuff look good on ultrasound, we did a subacromial injection. She has some left-sided symptoms as well. Adding rehab exercises for both sides, return in 6 weeks.  Annual physical exam I will catch her up on her diabetes preventive measures at the 6-week follow-up for the shoulder and knee.    ___________________________________________ [name]. Benjamin Stain, M.D., ABFM., CAQSM. Primary Care and Sports Medicine [facility]  Adjunct Instructor of Family Medicine  [employer]

## 2019-08-17 NOTE — Assessment & Plan Note (Signed)
I will catch her up on her diabetes preventive measures at the 6-week follow-up for the shoulder and knee.

## 2019-09-09 ENCOUNTER — Other Ambulatory Visit: Payer: Self-pay | Admitting: Sports Medicine

## 2019-09-09 ENCOUNTER — Other Ambulatory Visit: Payer: Self-pay

## 2019-09-09 DIAGNOSIS — I10 Essential (primary) hypertension: Secondary | ICD-10-CM

## 2019-09-09 MED ORDER — VALSARTAN 320 MG PO TABS: 320.0000 mg | ORAL_TABLET | Freq: Every day | ORAL | 1 refills | Status: DC

## 2019-09-28 ENCOUNTER — Ambulatory Visit: Payer: BC Managed Care – PPO | Admitting: Sports Medicine

## 2019-10-12 ENCOUNTER — Other Ambulatory Visit: Payer: Self-pay | Admitting: Sports Medicine

## 2019-10-12 DIAGNOSIS — M7918 Myalgia, other site: Secondary | ICD-10-CM

## 2019-10-12 MED ORDER — FLUOXETINE HCL 20 MG PO CAPS: 20.0000 mg | ORAL_CAPSULE | Freq: Every day | ORAL | 3 refills | Status: DC

## 2020-01-10 LAB — HM DIABETES EYE EXAM

## 2020-03-17 ENCOUNTER — Encounter: Payer: Self-pay | Admitting: Sports Medicine

## 2020-04-09 ENCOUNTER — Other Ambulatory Visit: Payer: Self-pay | Admitting: Sports Medicine

## 2020-04-09 ENCOUNTER — Telehealth: Payer: BC Managed Care – PPO | Admitting: Physician Assistant

## 2020-04-09 DIAGNOSIS — R300 Dysuria: Secondary | ICD-10-CM | POA: Diagnosis not present

## 2020-04-09 DIAGNOSIS — I10 Essential (primary) hypertension: Secondary | ICD-10-CM

## 2020-04-09 MED ORDER — NITROFURANTOIN MONOHYD MACRO 100 MG PO CAPS: 100.0000 mg | ORAL_CAPSULE | Freq: Two times a day (BID) | ORAL | 0 refills | Status: DC

## 2020-04-09 NOTE — Progress Notes (Signed)
I have spent 5 minutes in review of e-visit questionnaire, review and updating patient chart, medical decision making and response to patient.   Alveda Vanhorne Cody Hardy Harcum, PA-C    

## 2020-04-09 NOTE — Progress Notes (Signed)

## 2020-05-03 ENCOUNTER — Encounter (HOSPITAL_COMMUNITY): Payer: Self-pay | Admitting: *Deleted

## 2020-05-09 ENCOUNTER — Telehealth: Payer: BC Managed Care – PPO | Admitting: Physician Assistant

## 2020-05-09 DIAGNOSIS — R300 Dysuria: Secondary | ICD-10-CM | POA: Diagnosis not present

## 2020-05-09 MED ORDER — NITROFURANTOIN MONOHYD MACRO 100 MG PO CAPS
100.0000 mg | ORAL_CAPSULE | Freq: Two times a day (BID) | ORAL | 0 refills | Status: DC
Start: 2020-05-09 — End: 2020-06-04

## 2020-05-09 NOTE — Progress Notes (Signed)

## 2020-05-09 NOTE — Progress Notes (Signed)
I have spent 5 minutes in review of e-visit questionnaire, review and updating patient chart, medical decision making and response to patient.   Jahad Old Cody Harmon Bommarito, PA-C    

## 2020-05-30 ENCOUNTER — Ambulatory Visit (INDEPENDENT_AMBULATORY_CARE_PROVIDER_SITE_OTHER): Payer: BC Managed Care – PPO | Admitting: Sports Medicine

## 2020-05-30 ENCOUNTER — Other Ambulatory Visit: Payer: Self-pay

## 2020-05-30 ENCOUNTER — Ambulatory Visit (INDEPENDENT_AMBULATORY_CARE_PROVIDER_SITE_OTHER): Payer: BC Managed Care – PPO

## 2020-05-30 DIAGNOSIS — G894 Chronic pain syndrome: Secondary | ICD-10-CM | POA: Insufficient documentation

## 2020-05-30 DIAGNOSIS — M7542 Impingement syndrome of left shoulder: Secondary | ICD-10-CM

## 2020-05-30 DIAGNOSIS — M7541 Impingement syndrome of right shoulder: Secondary | ICD-10-CM

## 2020-05-30 NOTE — Assessment & Plan Note (Addendum)
Chelsea [name] returns, she is a 35 year old female, she had bilateral impingement symptoms at the last visit, we injected her right subacromial bursa, her right side is pain-free, continues to have symptoms on the left, today we injected her left subacromial bursa. Of note she had a full-thickness retracted cuff tear on ultrasound today on the left. Return as needed for this.

## 2020-05-30 NOTE — Progress Notes (Signed)
    Procedures performed today:    Procedure: Real-time Ultrasound Guided injection of the left subacromial bursa Device: Samsung HS60  Verbal informed consent obtained.  Time-out conducted.  Noted no overlying erythema, induration, or other signs of local infection.  Skin prepped in a sterile fashion.  Local anesthesia: Topical Ethyl chloride.  With sterile technique and under real time ultrasound guidance:  Noted full-thickness retracted cuff tear, 1 cc Kenalog 40, 1 cc lidocaine, 1 cc bupivacaine injected easily Completed without difficulty  Advised to call if fevers/chills, erythema, induration, drainage, or persistent bleeding.  Images permanently stored and available for review in PACS.  Impression: Technically successful ultrasound guided injection.  Independent interpretation of notes and tests performed by another provider:   None.  Brief History, Exam, Impression, and Recommendations:    Impingement syndrome of both shoulders Chelsea Ash returns, she is a 35 year old female, she had bilateral impingement symptoms at the last visit, we injected her right subacromial bursa, her right side is pain-free, continues to have symptoms on the left, today we injected her left subacromial bursa. Of note she had a full-thickness retracted cuff tear on ultrasound today on the left. Return as needed for this.  Chronic pain syndrome At this point I think it is time to enlist the assistance of pain management. [name] has unfortunately not worked in several years, she is 35 years old, she tells me she is debilitated by widespread pain all over, this is likely fibromyalgia, neuropathic agents have not been effective thus far. I have discussed with her that the ultimate goal is to not take away all of her pain, this is not realistic, but to decrease her pain to the point where she can be a functional member of society.    ___________________________________________ [name]. Benjamin Stain, M.D.,  ABFM., CAQSM. Primary Care and Sports Medicine [facility]  Adjunct Instructor of Family Medicine  [employer]

## 2020-05-30 NOTE — Assessment & Plan Note (Addendum)
At this point I think it is time to enlist the assistance of pain management. Chelsea [name] has unfortunately not worked in several years, she is 35 years old, she tells me she is debilitated by widespread pain all over, this is likely fibromyalgia, neuropathic agents have not been effective thus far. I have discussed with her that the ultimate goal is to not take away all of her pain, this is not realistic, but to decrease her pain to the point where she can be a functional member of society.

## 2020-06-04 ENCOUNTER — Telehealth: Payer: BC Managed Care – PPO | Admitting: Family

## 2020-06-04 DIAGNOSIS — R399 Unspecified symptoms and signs involving the genitourinary system: Secondary | ICD-10-CM

## 2020-06-04 NOTE — Progress Notes (Signed)
Based on what you shared with me, I feel your condition warrants further evaluation and I recommend that you be seen in a face to face office visit.   You need to be seen in person for further testing. After reviewing your chart it looks like you've been treated for UTI a couple times over the last two months.   NOTE: If you entered your credit card information for this eVisit, you will not be charged. You may see a "hold" on your card for the $35 but that hold will drop off and you will not have a charge processed.   If you are having a true medical emergency please call 911.      For an urgent face to face visit, [place] has five urgent care centers for your convenience:     [facility] at [facility] Directions [phone] [address] [place], [place] [address] . 10 am - 6pm Monday - Friday    [facility]) Get Driving Directions [phone] [address] [place], [place] [address] . 10 am to 8 pm Monday-Friday . 12 pm to 8 pm [facility] Urgent Care Center [facility]) Get Driving Directions [phone]  [address] [place],  [place]  [address] . 8 am to 8 pm Monday-Friday . 8 am to 4 pm [facility] Urgent Care at [facility] Get Driving Directions [phone] [address] [place], [place] [address] . 8 am to 8 pm Monday-Friday . 9 am to 6 pm Saturday . 11 am to 6 pm Sunday   [facility] Health Urgent Care at [facility] Get Driving Directions  [phone] [address] [place], [place] [address] . 8 am to 8 pm Monday-Friday . 8 am to 4 pm [facility] Urgent Care at [facility] Directions [phone] [address] [place], [place] [address] . 12 pm to 6 pm Monday-Friday    VIDEO VISITS:  is now providing on-demand video visits  for your convenience. Speak to one of our providers from the comfort of your home 8 am to 8 pm or after hours through our partner vendor.   For Video Visit details and options:   [email or web address]      Your MyChart E-visit questionnaire answers were reviewed by a board certified advanced clinical practitioner to complete your personal care plan based on your specific symptoms.  Thank you for using e-Visits.

## 2020-06-15 ENCOUNTER — Telehealth (INDEPENDENT_AMBULATORY_CARE_PROVIDER_SITE_OTHER): Payer: BC Managed Care – PPO | Admitting: Sports Medicine

## 2020-06-15 DIAGNOSIS — N3000 Acute cystitis without hematuria: Secondary | ICD-10-CM | POA: Diagnosis not present

## 2020-06-15 MED ORDER — CIPROFLOXACIN HCL 750 MG PO TABS: 750.0000 mg | ORAL_TABLET | Freq: Two times a day (BID) | ORAL | 0 refills | Status: AC

## 2020-06-15 NOTE — Assessment & Plan Note (Signed)
Chelsea [name] has had several recent urinary tract infections with burning, urgency, frequency. Her last urine culture grew out Proteus mirabilis resistant to nitrofurantoin, unfortunately on her recent virtual visit she was prescribed nitrofurantoin, not surprisingly her urinary tract infection did not get better. Ordering updated urinalysis, urine culture, looks like ciprofloxacin had a better MIC so we will use that, she understands not to start the ciprofloxacin until she has dropped off her urinalysis at the lab. Declines Pyridium, declines Diflucan.

## 2020-06-15 NOTE — Progress Notes (Signed)
   Virtual Visit via Telephone   I connected with  Chelsea [name]  on 06/15/20 by telephone/telehealth and verified that I am speaking with the correct person using two identifiers.   I discussed the limitations, risks, security and privacy concerns of performing an evaluation and management service by telephone, including the higher likelihood of inaccurate diagnosis and treatment, and the availability of in person appointments.  We also discussed the likely need of an additional face to face encounter for complete and high quality delivery of care.  I also discussed with the patient that there may be a patient responsible charge related to this service. The patient expressed understanding and wishes to proceed.  Provider location is in medical facility. Patient location is at their home, different from provider location. People involved in care of the patient during this telehealth encounter were myself, my nurse/medical assistant, and my front office/scheduling team member.  Review of Systems: No fevers, chills, night sweats, weight loss, chest pain, or shortness of breath.   Objective Findings:    General: Speaking full sentences, no audible heavy breathing.  Sounds alert and appropriately interactive.    Independent interpretation of tests performed by another provider:   None.  Brief History, Exam, Impression, and Recommendations:    Acute cystitis Kinzly has had several recent urinary tract infections with burning, urgency, frequency. Her last urine culture grew out Proteus mirabilis resistant to nitrofurantoin, unfortunately on her recent virtual visit she was prescribed nitrofurantoin, not surprisingly her urinary tract infection did not get better. Ordering updated urinalysis, urine culture, looks like ciprofloxacin had a better MIC so we will use that, she understands not to start the ciprofloxacin until she has dropped off her urinalysis at the lab. Declines Pyridium,  declines Diflucan.   I discussed the above assessment and treatment plan with the patient. The patient was provided an opportunity to ask questions and all were answered. The patient agreed with the plan and demonstrated an understanding of the instructions.   The patient was advised to call back or seek an in-person evaluation if the symptoms worsen or if the condition fails to improve as anticipated.   I provided 30 minutes of face to face and non-face-to-face time during this encounter date, time was needed to gather information, review chart, records, communicate/coordinate with staff remotely, as well as complete documentation.   ___________________________________________ [name]. Benjamin Stain, M.D., ABFM., CAQSM. Primary Care and Sports Medicine [facility]  Adjunct Professor of Family Medicine  [employer]

## 2020-06-18 LAB — URINE CULTURE
MICRO NUMBER:: 11801314
SPECIMEN QUALITY:: ADEQUATE

## 2020-06-18 LAB — URINALYSIS W MICROSCOPIC + REFLEX CULTURE
Bilirubin Urine: NEGATIVE
Glucose, UA: NEGATIVE
Hgb urine dipstick: NEGATIVE
Nitrites, Initial: NEGATIVE
Protein, ur: NEGATIVE
Specific Gravity, Urine: 1.025 (ref 1.001–1.035)
Yeast: NONE SEEN /HPF
pH: 5.5 (ref 5.0–8.0)

## 2020-06-18 LAB — CULTURE INDICATED

## 2020-06-29 DIAGNOSIS — G894 Chronic pain syndrome: Secondary | ICD-10-CM | POA: Diagnosis not present

## 2020-06-29 DIAGNOSIS — M2550 Pain in unspecified joint: Secondary | ICD-10-CM | POA: Diagnosis not present

## 2020-06-29 DIAGNOSIS — Z79899 Other long term (current) drug therapy: Secondary | ICD-10-CM | POA: Diagnosis not present

## 2020-06-29 DIAGNOSIS — M797 Fibromyalgia: Secondary | ICD-10-CM | POA: Diagnosis not present

## 2020-06-29 DIAGNOSIS — Z5181 Encounter for therapeutic drug level monitoring: Secondary | ICD-10-CM | POA: Diagnosis not present

## 2020-06-30 DIAGNOSIS — N3001 Acute cystitis with hematuria: Secondary | ICD-10-CM

## 2020-07-14 DIAGNOSIS — G894 Chronic pain syndrome: Secondary | ICD-10-CM | POA: Diagnosis not present

## 2020-07-14 DIAGNOSIS — M797 Fibromyalgia: Secondary | ICD-10-CM | POA: Diagnosis not present

## 2020-07-19 ENCOUNTER — Ambulatory Visit: Payer: BC Managed Care – PPO | Admitting: Sports Medicine

## 2020-07-19 ENCOUNTER — Ambulatory Visit (INDEPENDENT_AMBULATORY_CARE_PROVIDER_SITE_OTHER): Payer: BC Managed Care – PPO

## 2020-07-19 ENCOUNTER — Other Ambulatory Visit: Payer: Self-pay

## 2020-07-19 DIAGNOSIS — S93401A Sprain of unspecified ligament of right ankle, initial encounter: Secondary | ICD-10-CM | POA: Insufficient documentation

## 2020-07-19 DIAGNOSIS — S83412A Sprain of medial collateral ligament of left knee, initial encounter: Secondary | ICD-10-CM | POA: Insufficient documentation

## 2020-07-19 DIAGNOSIS — S93421A Sprain of deltoid ligament of right ankle, initial encounter: Secondary | ICD-10-CM

## 2020-07-19 DIAGNOSIS — M25571 Pain in right ankle and joints of right foot: Secondary | ICD-10-CM | POA: Diagnosis not present

## 2020-07-19 DIAGNOSIS — R600 Localized edema: Secondary | ICD-10-CM | POA: Diagnosis not present

## 2020-07-19 NOTE — Assessment & Plan Note (Signed)
As below took a misstep on her driveway 2 to 3 weeks ago, having pain medial joint line of the left knee, on exam there is no effusion, only minimal swelling, she has pain at the medial joint line, reproduction of pain with valgus stress consistent with MCL sprain, no joint line opening consistent with a grade 1 sprain. She has a knee brace at home, I will give her some conditioning exercises and she can return as needed for this.

## 2020-07-19 NOTE — Assessment & Plan Note (Signed)
This is a pleasant 36 year old female, she took a misstep on her driveway about 2 to 3 weeks ago and everted her right ankle, she has pain along the medial malleolus, she is able to bear weight. Ankle is stable. We are can get some x-rays considering that she still has pain, a lace up ankle brace, ankle conditioning exercises, return to see me as needed for this.

## 2020-07-19 NOTE — Progress Notes (Signed)
    Procedures performed today:    None.  Independent interpretation of notes and tests performed by another provider:   None.  Brief History, Exam, Impression, and Recommendations:    Right ankle sprain This is a pleasant 35 year old female, she took a misstep on her driveway about 2 to 3 weeks ago and everted her right ankle, she has pain along the medial malleolus, she is able to bear weight. Ankle is stable. We are can get some x-rays considering that she still has pain, a lace up ankle brace, ankle conditioning exercises, return to see me as needed for this.  MCL sprain of left knee As below took a misstep on her driveway 2 to 3 weeks ago, having pain medial joint line of the left knee, on exam there is no effusion, only minimal swelling, she has pain at the medial joint line, reproduction of pain with valgus stress consistent with MCL sprain, no joint line opening consistent with a grade 1 sprain. She has a knee brace at home, I will give her some conditioning exercises and she can return as needed for this.    ___________________________________________ [name]. Benjamin Stain, M.D., ABFM., CAQSM. Primary Care and Sports Medicine [facility]  Adjunct Instructor of Family Medicine  [employer]

## 2020-07-20 ENCOUNTER — Encounter (INDEPENDENT_AMBULATORY_CARE_PROVIDER_SITE_OTHER): Payer: BC Managed Care – PPO

## 2020-07-20 DIAGNOSIS — N3001 Acute cystitis with hematuria: Secondary | ICD-10-CM

## 2020-07-21 MED ORDER — LEVOFLOXACIN 500 MG PO TABS: 500.0000 mg | ORAL_TABLET | Freq: Every day | ORAL | 0 refills | Status: AC

## 2020-07-21 NOTE — Telephone Encounter (Signed)
I spent 5 total minutes of online digital evaluation and management services. 

## 2020-07-21 NOTE — Assessment & Plan Note (Signed)
History of relatively pansensitive Proteus acute cystitis, recurrence of symptoms, adding Levaquin.

## 2020-07-27 DIAGNOSIS — G894 Chronic pain syndrome: Secondary | ICD-10-CM | POA: Diagnosis not present

## 2020-07-27 DIAGNOSIS — M797 Fibromyalgia: Secondary | ICD-10-CM | POA: Diagnosis not present

## 2020-07-27 DIAGNOSIS — M2550 Pain in unspecified joint: Secondary | ICD-10-CM | POA: Diagnosis not present

## 2020-08-22 DIAGNOSIS — G894 Chronic pain syndrome: Secondary | ICD-10-CM | POA: Diagnosis not present

## 2020-08-22 DIAGNOSIS — M797 Fibromyalgia: Secondary | ICD-10-CM | POA: Diagnosis not present

## 2020-08-24 DIAGNOSIS — G894 Chronic pain syndrome: Secondary | ICD-10-CM | POA: Diagnosis not present

## 2020-08-24 DIAGNOSIS — M2550 Pain in unspecified joint: Secondary | ICD-10-CM | POA: Diagnosis not present

## 2020-08-24 DIAGNOSIS — M797 Fibromyalgia: Secondary | ICD-10-CM | POA: Diagnosis not present

## 2020-08-29 ENCOUNTER — Ambulatory Visit (INDEPENDENT_AMBULATORY_CARE_PROVIDER_SITE_OTHER): Payer: BC Managed Care – PPO

## 2020-08-29 ENCOUNTER — Ambulatory Visit (INDEPENDENT_AMBULATORY_CARE_PROVIDER_SITE_OTHER): Payer: BC Managed Care – PPO | Admitting: Sports Medicine

## 2020-08-29 ENCOUNTER — Other Ambulatory Visit: Payer: Self-pay

## 2020-08-29 DIAGNOSIS — M25562 Pain in left knee: Secondary | ICD-10-CM | POA: Diagnosis not present

## 2020-08-29 DIAGNOSIS — M1712 Unilateral primary osteoarthritis, left knee: Secondary | ICD-10-CM | POA: Diagnosis not present

## 2020-08-29 DIAGNOSIS — G8929 Other chronic pain: Secondary | ICD-10-CM | POA: Diagnosis not present

## 2020-08-29 DIAGNOSIS — M25561 Pain in right knee: Secondary | ICD-10-CM

## 2020-08-29 DIAGNOSIS — M1711 Unilateral primary osteoarthritis, right knee: Secondary | ICD-10-CM | POA: Diagnosis not present

## 2020-08-29 NOTE — Assessment & Plan Note (Addendum)
Worsening right knee pain, we do need some updated x-rays, last injected about a year ago, repeat injection today. Return to see me in 1 month as needed.  Exacerbation of a chronic process with pharmacologic management.

## 2020-08-29 NOTE — Progress Notes (Signed)
    Procedures performed today:    Procedure: Real-time Ultrasound Guided injection of the right knee Device: Samsung HS60  Verbal informed consent obtained.  Time-out conducted.  Noted no overlying erythema, induration, or other signs of local infection.  Skin prepped in a sterile fashion.  Local anesthesia: Topical Ethyl chloride.  With sterile technique and under real time ultrasound guidance: Trace effusion noted, 1 cc Kenalog 40, 2 cc lidocaine, 2 cc bupivacaine injected easily Completed without difficulty  Advised to call if fevers/chills, erythema, induration, drainage, or persistent bleeding.  Images permanently stored and available for review in PACS.  Impression: Technically successful ultrasound guided injection.  Independent interpretation of notes and tests performed by another provider:   None.  Brief History, Exam, Impression, and Recommendations:    Right knee pain Worsening right knee pain, we do need some updated x-rays, last injected about a year ago, repeat injection today. Return to see me in 1 month as needed.  Exacerbation of a chronic process with pharmacologic management.    ___________________________________________ [name]. Benjamin Stain, M.D., ABFM., CAQSM. Primary Care and Sports Medicine [facility]  Adjunct Instructor of Family Medicine  [employer]

## 2020-09-03 ENCOUNTER — Telehealth: Payer: BC Managed Care – PPO | Admitting: Family

## 2020-09-03 DIAGNOSIS — R399 Unspecified symptoms and signs involving the genitourinary system: Secondary | ICD-10-CM

## 2020-09-03 MED ORDER — NITROFURANTOIN MONOHYD MACRO 100 MG PO CAPS: 100.0000 mg | ORAL_CAPSULE | Freq: Two times a day (BID) | ORAL | 0 refills | Status: DC

## 2020-09-03 NOTE — Progress Notes (Signed)

## 2020-09-21 DIAGNOSIS — M797 Fibromyalgia: Secondary | ICD-10-CM | POA: Diagnosis not present

## 2020-09-21 DIAGNOSIS — M2550 Pain in unspecified joint: Secondary | ICD-10-CM | POA: Diagnosis not present

## 2020-09-21 DIAGNOSIS — G894 Chronic pain syndrome: Secondary | ICD-10-CM | POA: Diagnosis not present

## 2020-09-25 ENCOUNTER — Telehealth: Payer: BC Managed Care – PPO | Admitting: Nurse Practitioner

## 2020-09-25 DIAGNOSIS — N3001 Acute cystitis with hematuria: Secondary | ICD-10-CM | POA: Diagnosis not present

## 2020-09-25 MED ORDER — NITROFURANTOIN MONOHYD MACRO 100 MG PO CAPS: 100.0000 mg | ORAL_CAPSULE | Freq: Two times a day (BID) | ORAL | 0 refills | Status: AC

## 2020-09-25 NOTE — Progress Notes (Signed)
E-Visit for Urinary Problems  We are sorry that you are not feeling well.  Here is how we plan to help!  Based on what you shared with me it looks like you most likely have a simple urinary tract infection.  A UTI (Urinary Tract Infection) is a bacterial infection of the bladder.  Most cases of urinary tract infections are simple to treat but a key part of your care is to encourage you to drink plenty of fluids and watch your symptoms carefully.  I have prescribed MacroBid 100 mg twice a day for 7 days.  We did notice that you had another UTI one month ago and were on the same antibiotic, if symptoms are not improving within 48 hours you should follow up with your provider in order to provide a urine sample for culture so we can determine the type of bacteria causing your symptoms and assure you are on the correct antibiotic.   Due to your other antibiotic allergies we will go ahead and use Macrobid again.   Your symptoms should gradually improve. Call us if the burning in your urine worsens, you develop worsening fever, back pain or pelvic pain or if your symptoms do not resolve after completing the antibiotic.  Urinary tract infections can be prevented by drinking plenty of water to keep your body hydrated.  Also be sure when you wipe, wipe from front to back and don't hold it in!  If possible, empty your bladder every 4 hours.  HOME CARE Drink plenty of fluids Compete the full course of the antibiotics even if the symptoms resolve Remember, when you need to go.go. Holding in your urine can increase the likelihood of getting a UTI! GET HELP RIGHT AWAY IF: You cannot urinate You get a high fever Worsening back pain occurs You see blood in your urine You feel sick to your stomach or throw up You feel like you are going to pass out  MAKE SURE YOU  Understand these instructions. Will watch your condition. Will get help right away if you are not doing well or get worse.   Thank you  for choosing an e-visit.  Your e-visit answers were reviewed by a board certified advanced clinical practitioner to complete your personal care plan. Depending upon the condition, your plan could have included both over the counter or prescription medications.  Please review your pharmacy choice. Make sure the pharmacy is open so you can pick up prescription now. If there is a problem, you may contact your provider through [employer] and have the prescription routed to another pharmacy.  Your safety is important to [place]. If you have drug allergies check your prescription carefully.   For the next 24 hours you can use MyChart to ask questions about today's visit, request a non-urgent call back, or ask for a work or school excuse. You will get an email in the next two days asking about your experience. I hope that your e-visit has been valuable and will speed your recovery.   I spent approximately 10 minutes reviewing the patient's history, current symptoms and coordinating their care today.    Meds ordered this encounter  Medications   nitrofurantoin, macrocrystal-monohydrate, (MACROBID) 100 MG capsule    Sig: Take 1 capsule (100 mg total) by mouth 2 (two) times daily for 7 days.    Dispense:  14 capsule    Refill:  0

## 2020-10-26 ENCOUNTER — Other Ambulatory Visit: Payer: Self-pay | Admitting: Sports Medicine

## 2020-10-26 DIAGNOSIS — M7918 Myalgia, other site: Secondary | ICD-10-CM

## 2020-10-26 DIAGNOSIS — I10 Essential (primary) hypertension: Secondary | ICD-10-CM

## 2020-11-05 ENCOUNTER — Telehealth: Payer: BC Managed Care – PPO | Admitting: Nurse Practitioner

## 2020-11-05 DIAGNOSIS — R399 Unspecified symptoms and signs involving the genitourinary system: Secondary | ICD-10-CM | POA: Diagnosis not present

## 2020-11-05 MED ORDER — NITROFURANTOIN MONOHYD MACRO 100 MG PO CAPS: 100.0000 mg | ORAL_CAPSULE | Freq: Two times a day (BID) | ORAL | 0 refills | Status: AC

## 2020-11-05 NOTE — Progress Notes (Signed)
E-Visit for Urinary Problems  We are sorry that you are not feeling well.  Here is how we plan to help!  Based on what you shared with me it looks like you most likely have a simple urinary tract infection. I have reviewed your chart and it appears your urinary symptoms have been recurrent over the past 1-2 months. Based on this information, if your symptoms develop after treatment today, you should follow up with your primary care physician for further testing to include a urine culture.   A UTI (Urinary Tract Infection) is a bacterial infection of the bladder.  Most cases of urinary tract infections are simple to treat but a key part of your care is to encourage you to drink plenty of fluids and watch your symptoms carefully.  I have prescribed MacroBid 100 mg twice a day for 5 days.  Your symptoms should gradually improve. Call us if the burning in your urine worsens, you develop worsening fever, back pain or pelvic pain or if your symptoms do not resolve after completing the antibiotic.  Urinary tract infections can be prevented by drinking plenty of water to keep your body hydrated.  Also be sure when you wipe, wipe from front to back and don't hold it in!  If possible, empty your bladder every 4 hours.  HOME CARE Drink plenty of fluids Compete the full course of the antibiotics even if the symptoms resolve Remember, when you need to go.go. Holding in your urine can increase the likelihood of getting a UTI! GET HELP RIGHT AWAY IF: You cannot urinate You get a high fever Worsening back pain occurs You see blood in your urine You feel sick to your stomach or throw up You feel like you are going to pass out  MAKE SURE YOU  Understand these instructions. Will watch your condition. Will get help right away if you are not doing well or get worse.   Thank you for choosing an e-visit.  Your e-visit answers were reviewed by a board certified advanced clinical practitioner to complete  your personal care plan. Depending upon the condition, your plan could have included both over the counter or prescription medications.  Please review your pharmacy choice. Make sure the pharmacy is open so you can pick up prescription now. If there is a problem, you may contact your provider through [employer] and have the prescription routed to another pharmacy.  Your safety is important to [place]. If you have drug allergies check your prescription carefully.   For the next 24 hours you can use MyChart to ask questions about today's visit, request a non-urgent call back, or ask for a work or school excuse. You will get an email in the next two days asking about your experience. I hope that your e-visit has been valuable and will speed your recovery.   I have spent at least 5 minutes reviewing and documenting in the patient's chart.

## 2020-11-16 DIAGNOSIS — G894 Chronic pain syndrome: Secondary | ICD-10-CM | POA: Diagnosis not present

## 2020-11-16 DIAGNOSIS — M797 Fibromyalgia: Secondary | ICD-10-CM | POA: Diagnosis not present

## 2020-11-16 DIAGNOSIS — M2550 Pain in unspecified joint: Secondary | ICD-10-CM | POA: Diagnosis not present

## 2020-12-04 ENCOUNTER — Telehealth: Payer: BC Managed Care – PPO | Admitting: Family Medicine

## 2020-12-04 DIAGNOSIS — R300 Dysuria: Secondary | ICD-10-CM | POA: Diagnosis not present

## 2020-12-04 MED ORDER — NITROFURANTOIN MONOHYD MACRO 100 MG PO CAPS: 100.0000 mg | ORAL_CAPSULE | Freq: Two times a day (BID) | ORAL | 0 refills | Status: AC

## 2020-12-04 NOTE — Progress Notes (Signed)

## 2021-01-29 ENCOUNTER — Encounter: Payer: Self-pay | Admitting: Sports Medicine

## 2021-01-29 ENCOUNTER — Telehealth: Payer: BC Managed Care – PPO | Admitting: Emergency Medicine

## 2021-01-29 NOTE — Telephone Encounter (Signed)
Cannot really manage her UTI virtually, she will need a visit with either myself or probably one of my partners since I do not have any openings, we can also then have the discussion of preventative treatment for recurrent UTIs.

## 2021-01-29 NOTE — Progress Notes (Signed)
Based on what you shared with me, I feel your condition warrants further evaluation and I recommend that you be seen in a face to face visit. I am concerned for you since this is your 9th healthcare visit for UTI in the last 10 months.  You need testing and care I cannot provide in this virtual setting.    NOTE: There will be NO CHARGE for this eVisit   If you are having a true medical emergency please call 911.      For an urgent face to face visit, [facility] has six urgent care centers for your convenience:     [facility] at [facility] Directions [phone] [address] [place], [place] [address]    [facility]) Get Driving Directions [phone] [address] [place], [place] [address]  [facility] Center [facility]) Get Driving Directions [phone] [address] [place],  [place]  [address]  [facility] at [facility] Get Driving Directions [phone] [address], [address] [place], [place] [address]   [facility] Health Urgent Care at [facility] Get Driving Directions  [phone] [address] [place], [place] [address]   [facility] Health Urgent Care at [facility] Directions [phone] [address], [address] [place], [place] [address]  Your MyChart E-visit questionnaire answers were reviewed by a board certified advanced clinical practitioner to complete your personal care plan based on your specific symptoms.  Thank you for using e-Visits.

## 2021-01-30 ENCOUNTER — Ambulatory Visit (INDEPENDENT_AMBULATORY_CARE_PROVIDER_SITE_OTHER): Payer: BC Managed Care – PPO | Admitting: Sports Medicine

## 2021-01-30 ENCOUNTER — Other Ambulatory Visit: Payer: Self-pay

## 2021-01-30 DIAGNOSIS — N3001 Acute cystitis with hematuria: Secondary | ICD-10-CM

## 2021-01-30 LAB — POCT URINALYSIS DIP (CLINITEK)
Bilirubin, UA: NEGATIVE
Glucose, UA: NEGATIVE mg/dL
Ketones, POC UA: NEGATIVE mg/dL
Nitrite, UA: POSITIVE — AB
POC PROTEIN,UA: 30 — AB
Spec Grav, UA: >=1.03 — AB (ref 1.010–1.025)
Urobilinogen, UA: 0.2 E.U./dL
pH, UA: 6 (ref 5.0–8.0)

## 2021-01-30 MED ORDER — CIPROFLOXACIN HCL 750 MG PO TABS: 750.0000 mg | ORAL_TABLET | Freq: Two times a day (BID) | ORAL | 0 refills | Status: AC

## 2021-01-30 NOTE — Assessment & Plan Note (Signed)
Recurrent cystitis, historically she has grown out Proteus mirabilis resistant to nitrofurantoin, good MIC with fluoroquinolones, adding ciprofloxacin 750 twice daily for 7 days. We will culture the urine, return as needed.

## 2021-01-30 NOTE — Progress Notes (Signed)
    Procedures performed today:    None.  Independent interpretation of notes and tests performed by another provider:   None.  Brief History, Exam, Impression, and Recommendations:    Acute cystitis Recurrent cystitis, historically she has grown out Proteus mirabilis resistant to nitrofurantoin, good MIC with fluoroquinolones, adding ciprofloxacin 750 twice daily for 7 days. We will culture the urine, return as needed.    ___________________________________________ [name]. Benjamin Stain, M.D., ABFM., CAQSM. Primary Care and Sports Medicine [facility]  Adjunct Instructor of Family Medicine  [employer] [facility] [facility] [employer]

## 2021-02-02 LAB — URINE CULTURE
MICRO NUMBER:: 12723831
SPECIMEN QUALITY:: ADEQUATE

## 2021-02-08 DIAGNOSIS — M2550 Pain in unspecified joint: Secondary | ICD-10-CM | POA: Diagnosis not present

## 2021-02-08 DIAGNOSIS — G894 Chronic pain syndrome: Secondary | ICD-10-CM | POA: Diagnosis not present

## 2021-02-08 DIAGNOSIS — M797 Fibromyalgia: Secondary | ICD-10-CM | POA: Diagnosis not present

## 2021-02-17 ENCOUNTER — Emergency Department (INDEPENDENT_AMBULATORY_CARE_PROVIDER_SITE_OTHER)
Admission: EM | Admit: 2021-02-17 | Discharge: 2021-02-17 | Disposition: A | Discharge status: Home / Self Care | Payer: BC Managed Care – PPO | Source: Home / Self Care | Attending: Family Medicine | Admitting: Family Medicine

## 2021-02-17 ENCOUNTER — Other Ambulatory Visit: Payer: Self-pay

## 2021-02-17 DIAGNOSIS — J0100 Acute maxillary sinusitis, unspecified: Secondary | ICD-10-CM | POA: Diagnosis not present

## 2021-02-17 LAB — POC SARS CORONAVIRUS 2 AG -  ED: SARS Coronavirus 2 Ag: NEGATIVE

## 2021-02-17 LAB — POCT INFLUENZA A/B
Influenza A, POC: NEGATIVE
Influenza B, POC: NEGATIVE

## 2021-02-17 MED ORDER — FLUCONAZOLE 150 MG PO TABS: 150.0000 mg | ORAL_TABLET | Freq: Every day | ORAL | 0 refills | Status: DC

## 2021-02-17 MED ORDER — FLUTICASONE PROPIONATE 50 MCG/ACT NA SUSP: 2.0000 | Freq: Every day | NASAL | 0 refills | Status: DC

## 2021-02-17 MED ORDER — CEFUROXIME AXETIL 500 MG PO TABS: 500.0000 mg | ORAL_TABLET | Freq: Two times a day (BID) | ORAL | 0 refills | Status: DC

## 2021-02-17 NOTE — Discharge Instructions (Signed)
Take the cefuroxime 2 times a day.  This is an antibiotic.  I noticed that you have developed a yeast infections on antibiotics in the past.  I have also prescribed Diflucan to take if needed Use Flonase 2 times a day for the first 3 days then once a day until symptoms have improved Drink lots of water Continue with Mucinex or guaifenesin product to loosen the mucus Run a humidifier if there is one available Call for problems

## 2021-02-17 NOTE — ED Provider Notes (Signed)
[facility]    CSN: [id number] Arrival date & time: 02/17/21  0855      History   Chief Complaint Chief Complaint  Patient presents with   Fever    Fever, ears are clogged, headache, and sore throat. X1 week    HPI Chelsea [name] is a 35 y.o. female.   HPI  Patient states she has been sick for over a week.  She states her ears feel clogged, she has headache and sinus pressure.  She states that she has postnasal drip that is yellow in color.  She has been feeling very tired.  She is not improving with over-the-counter medications.  She has not been vaccinated for flu or COVID.  Past Medical History:  Diagnosis Date   Depression    Endometrial polyp    Fibromyalgia    History of recurrent UTIs    Hypertension    Infertility, anovulation    Myofascial muscle pain    OA (osteoarthritis) of knee    BILATERAL   PCOS (polycystic ovarian syndrome)    PONV (postoperative nausea and vomiting)    Seasonal allergic rhinitis     Patient Active Problem List   Diagnosis Date Noted   Right ankle sprain 07/19/2020   Chronic pain syndrome 05/30/2020   Impingement syndrome of both shoulders 08/17/2019   Primary insomnia 05/11/2019   Paronychia of second toe, left 01/06/2019   Mixed hypercholesterolemia and hypertriglyceridemia 11/13/2018   Orgasmic headache 11/11/2018   Diet-controlled diabetes mellitus (HCC) 04/25/2017   Low back pain 08/24/2015   Annual physical exam 03/23/2015   Intractable migraine without aura and with status migrainosus 06/24/2014   Myofascial pain syndrome with depression 02/08/2014   Venous stasis dermatitis 01/25/2014   Right hip pain 12/13/2013   Metatarsalgia of both feet 10/18/2013   Right knee pain 02/13/2012   Acute cystitis 02/28/2009   Essential hypertension, benign 11/25/2008   POLYCYSTIC OVARIAN DISEASE 10/26/2008   History of gastric sleeve 10/26/2008    Past Surgical History:  Procedure Laterality Date   HYSTEROSCOPY  WITH D & C N/A 09/07/2014   Procedure:  DILATATION AND CURETTAGE /HYSTEROSCOPY/POLYPECTOMY;  Surgeon: Fermin Schwab, MD;  Location: [facility];  Service: Gynecology;  Laterality: N/A;   I & D RIGHT FOOT  1998   LAPAROSCOPIC GASTRIC SLEEVE RESECTION N/A 10/20/2017   Procedure: LAPAROSCOPIC GASTRIC SLEEVE RESECTION, UPPER ENDO, ERAS Pathway;  Surgeon: Berna Bue, MD;  Location: [facility];  Service: General;  Laterality: N/A;   POLYPECTOMY     uterine   TONSILLECTOMY  2001   WISDOM TOOTH EXTRACTION  2004    OB History     Gravida  0   Para  0   Term  0   Preterm  0   AB  0   Living  0      SAB  0   IAB  0   Ectopic  0   Multiple  0   Live Births               Home Medications    Prior to Admission medications   Medication Sig Start Date End Date Taking? Authorizing Provider  ALPRAZolam Prudy Feeler) 0.5 MG tablet Take 1 tablet (0.5 mg total) by mouth 3 (three) times daily as needed for anxiety. 12/09/18  Yes Monica Becton, MD  cefUROXime (CEFTIN) 500 MG tablet Take 1 tablet (500 mg total) by mouth 2 (two) times daily with a meal. 02/17/21  Yes Eustace Moore, MD  celecoxib (CELEBREX) 200 MG capsule Take 200 mg by mouth daily. 06/30/20  Yes [provider]  cyclobenzaprine (FLEXERIL) 5 MG tablet TAKE 1 OR 2 EVERY 8 HOURS AS NEEDED FOR MUSCLE RELAXANT. CAUTION: MAY CAUSE DROWSINESS. 09/09/19  Yes Monica Becton, MD  fluconazole (DIFLUCAN) 150 MG tablet Take 1 tablet (150 mg total) by mouth daily. Repeat in 1 week if needed 02/17/21  Yes Eustace Moore, MD  FLUoxetine (PROZAC) 20 MG capsule TAKE 1 CAPSULE BY MOUTH EVERY DAY 10/27/20  Yes Monica Becton, MD  fluticasone [facility]) 50 MCG/ACT nasal spray Place 2 sprays into both nostrils daily. 02/17/21  Yes Eustace Moore, MD  topiramate (TOPAMAX) 100 MG tablet Take 100 mg by mouth 2 (two) times daily. 02/08/21  Yes [provider]  valsartan (DIOVAN) 320  MG tablet TAKE 1 TABLET BY MOUTH EVERY DAY 10/27/20  Yes Monica Becton, MD  zolpidem (AMBIEN) 10 MG tablet TAKE 1 TABLET BY MOUTH AT BEDTIME AS NEEDED FOR SLEEP. 06/28/19  Yes Monica Becton, MD    Family History Family History  Problem Relation Age of Onset   Diabetes Mother    Polycystic ovary syndrome Mother    Hypertension Father    Diabetes Father    Fibromyalgia Maternal Aunt    Fibromyalgia Cousin    Cancer Paternal Grandfather        prostate   Glaucoma Paternal Grandmother     Social History Social History   Tobacco Use   Smoking status: Never   Smokeless tobacco: Never  Vaping Use   Vaping Use: Never used  Substance Use Topics   Alcohol use: No    Alcohol/week: 0.0 standard drinks   Drug use: No     Allergies   Enalapril maleate, Sulfa antibiotics, Amoxicillin, Erythromycin, and Penicillins   Review of Systems Review of Systems  See HPI Physical Exam Triage Vital Signs ED Triage Vitals  Enc Vitals Group     BP 02/17/21 0940 139/83     Pulse Rate 02/17/21 0940 93     Resp 02/17/21 0940 18     Temp 02/17/21 0940 100 F (37.8 C)     Temp Source 02/17/21 0940 Oral     SpO2 02/17/21 0940 95 %     Weight 02/17/21 0938 285 lb (129.3 kg)     Height 02/17/21 0938 5\' 9"  (1.753 m)     Head Circumference --      Peak Flow --      Pain Score 02/17/21 0938 8     Pain Loc --      Pain Edu? --      Excl. in GC? --    No data found.  Updated Vital Signs BP 139/83 (BP Location: Left Arm)    Pulse 93    Temp 100 F (37.8 C) (Oral)    Resp 18    Ht 5\' 9"  (1.753 m)    Wt 129.3 kg    SpO2 95%    BMI 42.09 kg/m      Physical Exam Constitutional:      General: She is not in acute distress.    Appearance: She is well-developed. She is obese.  HENT:     Head: Normocephalic and atraumatic.     Right Ear: Tympanic membrane and ear canal normal.     Left Ear: Tympanic membrane and ear canal normal.     Nose: Congestion and rhinorrhea present.  Mouth/Throat:     Pharynx: Posterior oropharyngeal erythema present.     Comments: Thick yellow rhinorrhea down back of throat Eyes:     Conjunctiva/sclera: Conjunctivae normal.     Pupils: Pupils are equal, round, and reactive to light.  Cardiovascular:     Rate and Rhythm: Normal rate and regular rhythm.     Heart sounds: Normal heart sounds.  Pulmonary:     Effort: Pulmonary effort is normal. No respiratory distress.     Breath sounds: Normal breath sounds.  Abdominal:     General: There is no distension.     Palpations: Abdomen is soft.  Musculoskeletal:        General: Normal range of motion.     Cervical back: Normal range of motion.  Lymphadenopathy:     Cervical: Cervical adenopathy present.  Skin:    General: Skin is warm and dry.  Neurological:     Mental Status: She is alert.  Psychiatric:        Mood and Affect: Mood normal.        Behavior: Behavior normal.     UC Treatments / Results  Labs (all labs ordered are listed, but only abnormal results are displayed) Labs Reviewed  POCT INFLUENZA A/B - Normal  POC SARS CORONAVIRUS 2 AG -  ED - Normal    EKG   Radiology No results found.  Procedures Procedures (including critical care time)  Medications Ordered in UC Medications - No data to display  Initial Impression / Assessment and Plan / UC Course  I have reviewed the triage vital signs and the nursing notes.  Pertinent labs & imaging results that were available during my care of the patient were reviewed by me and considered in my medical decision making (see chart for details).     Final Clinical Impressions(s) / UC Diagnoses   Final diagnoses:  Acute non-recurrent maxillary sinusitis     Discharge Instructions      Take the cefuroxime 2 times a day.  This is an antibiotic.  I noticed that you have developed a yeast infections on antibiotics in the past.  I have also prescribed Diflucan to take if needed Use Flonase 2 times a day for  the first 3 days then once a day until symptoms have improved Drink lots of water Continue with Mucinex or guaifenesin product to loosen the mucus Run a humidifier if there is one available Call for problems   ED Prescriptions     Medication Sig Dispense Auth. Provider   cefUROXime (CEFTIN) 500 MG tablet Take 1 tablet (500 mg total) by mouth 2 (two) times daily with a meal. 14 tablet Eustace Moore, MD   fluconazole (DIFLUCAN) 150 MG tablet Take 1 tablet (150 mg total) by mouth daily. Repeat in 1 week if needed 2 tablet Eustace Moore, MD   fluticasone [facility]) 50 MCG/ACT nasal spray Place 2 sprays into both nostrils daily. 16 g Eustace Moore, MD      PDMP not reviewed this encounter.   Eustace Moore, MD 02/17/21 [phone]

## 2021-02-17 NOTE — ED Triage Notes (Signed)
Pt states that she has a fever, ears are clogged, headache and sore throat. X1 week  Pt states that she isn't vaccinated for covid. Pt states that she hasn't had flu vaccine.

## 2021-02-19 ENCOUNTER — Emergency Department
Admission: EM | Admit: 2021-02-19 | Discharge: 2021-02-19 | Disposition: A | Discharge status: Home / Self Care | Payer: BC Managed Care – PPO | Source: Home / Self Care

## 2021-02-19 ENCOUNTER — Telehealth: Payer: Self-pay

## 2021-02-19 ENCOUNTER — Other Ambulatory Visit: Payer: Self-pay

## 2021-02-19 ENCOUNTER — Encounter: Payer: Self-pay | Admitting: Sports Medicine

## 2021-02-19 DIAGNOSIS — J029 Acute pharyngitis, unspecified: Secondary | ICD-10-CM | POA: Diagnosis not present

## 2021-02-19 LAB — POCT RAPID STREP A (OFFICE): Rapid Strep A Screen: NEGATIVE

## 2021-02-19 MED ORDER — PREDNISONE 20 MG PO TABS: ORAL_TABLET | ORAL | 0 refills | Status: DC

## 2021-02-19 NOTE — Discharge Instructions (Addendum)
Advised patient to take medication as directed with food to completion.  Advised patient may take with first dose of Ceftin for the next 5 days.  Encouraged patient increase daily water intake while taking these medications.  Advised patient we will follow-up once throat culture results return.

## 2021-02-19 NOTE — ED Provider Notes (Signed)
[facility]    CSN: [id number] Arrival date & time: 02/19/21  1613      History   Chief Complaint Chief Complaint  Patient presents with   Sore Throat    HPI Chelsea [name] is a 35 y.o. female.   HPI 35 year old female presents with sore throat for 2 days.  PMH significant for HTN, fibromyalgia, chronic pain syndrome and myofascial muscle pain.  Patient was evaluated here on 02/17/2021 for sinus infection and was treated with Ceftin.  Past Medical History:  Diagnosis Date   Depression    Endometrial polyp    Fibromyalgia    History of recurrent UTIs    Hypertension    Infertility, anovulation    Myofascial muscle pain    OA (osteoarthritis) of knee    BILATERAL   PCOS (polycystic ovarian syndrome)    PONV (postoperative nausea and vomiting)    Seasonal allergic rhinitis     Patient Active Problem List   Diagnosis Date Noted   Right ankle sprain 07/19/2020   Chronic pain syndrome 05/30/2020   Impingement syndrome of both shoulders 08/17/2019   Primary insomnia 05/11/2019   Paronychia of second toe, left 01/06/2019   Mixed hypercholesterolemia and hypertriglyceridemia 11/13/2018   Orgasmic headache 11/11/2018   Diet-controlled diabetes mellitus (HCC) 04/25/2017   Low back pain 08/24/2015   Annual physical exam 03/23/2015   Intractable migraine without aura and with status migrainosus 06/24/2014   Myofascial pain syndrome with depression 02/08/2014   Venous stasis dermatitis 01/25/2014   Right hip pain 12/13/2013   Metatarsalgia of both feet 10/18/2013   Right knee pain 02/13/2012   Acute cystitis 02/28/2009   Essential hypertension, benign 11/25/2008   POLYCYSTIC OVARIAN DISEASE 10/26/2008   History of gastric sleeve 10/26/2008    Past Surgical History:  Procedure Laterality Date   HYSTEROSCOPY WITH D & C N/A 09/07/2014   Procedure:  DILATATION AND CURETTAGE /HYSTEROSCOPY/POLYPECTOMY;  Surgeon: Fermin Schwab, MD;  Location: [facility];  Service: Gynecology;  Laterality: N/A;   I & D RIGHT FOOT  1998   LAPAROSCOPIC GASTRIC SLEEVE RESECTION N/A 10/20/2017   Procedure: LAPAROSCOPIC GASTRIC SLEEVE RESECTION, UPPER ENDO, ERAS Pathway;  Surgeon: Berna Bue, MD;  Location: [facility];  Service: General;  Laterality: N/A;   POLYPECTOMY     uterine   TONSILLECTOMY  2001   WISDOM TOOTH EXTRACTION  2004    OB History     Gravida  0   Para  0   Term  0   Preterm  0   AB  0   Living  0      SAB  0   IAB  0   Ectopic  0   Multiple  0   Live Births               Home Medications    Prior to Admission medications   Medication Sig Start Date End Date Taking? Authorizing Provider  predniSONE (DELTASONE) 20 MG tablet Take 3 tabs PO daily x 5 days. 02/19/21  Yes Trevor Iha, FNP  ALPRAZolam Prudy Feeler) 0.5 MG tablet Take 1 tablet (0.5 mg total) by mouth 3 (three) times daily as needed for anxiety. 12/09/18   Monica Becton, MD  cefUROXime (CEFTIN) 500 MG tablet Take 1 tablet (500 mg total) by mouth 2 (two) times daily with a meal. 02/17/21   Eustace Moore, MD  celecoxib (CELEBREX) 200 MG capsule Take 200 mg by mouth daily.  06/30/20   [provider]  cyclobenzaprine (FLEXERIL) 5 MG tablet TAKE 1 OR 2 EVERY 8 HOURS AS NEEDED FOR MUSCLE RELAXANT. CAUTION: MAY CAUSE DROWSINESS. 09/09/19   Monica Becton, MD  fluconazole (DIFLUCAN) 150 MG tablet Take 1 tablet (150 mg total) by mouth daily. Repeat in 1 week if needed 02/17/21   Eustace Moore, MD  FLUoxetine (PROZAC) 20 MG capsule TAKE 1 CAPSULE BY MOUTH EVERY DAY 10/27/20   Monica Becton, MD  fluticasone [facility]) 50 MCG/ACT nasal spray Place 2 sprays into both nostrils daily. 02/17/21   Eustace Moore, MD  topiramate (TOPAMAX) 100 MG tablet Take 100 mg by mouth 2 (two) times daily. 02/08/21   [provider]  valsartan (DIOVAN) 320 MG tablet TAKE 1 TABLET BY MOUTH EVERY DAY 10/27/20   Monica Becton, MD  zolpidem (AMBIEN) 10 MG tablet TAKE 1 TABLET BY MOUTH AT BEDTIME AS NEEDED FOR SLEEP. 06/28/19   Monica Becton, MD    Family History Family History  Problem Relation Age of Onset   Diabetes Mother    Polycystic ovary syndrome Mother    Hypertension Father    Diabetes Father    Fibromyalgia Maternal Aunt    Fibromyalgia Cousin    Cancer Paternal Grandfather        prostate   Glaucoma Paternal Grandmother     Social History Social History   Tobacco Use   Smoking status: Never   Smokeless tobacco: Never  Vaping Use   Vaping Use: Never used  Substance Use Topics   Alcohol use: No    Alcohol/week: 0.0 standard drinks   Drug use: No     Allergies   Enalapril maleate, Sulfa antibiotics, Amoxicillin, Erythromycin, and Penicillins   Review of Systems Review of Systems  HENT:  Positive for sore throat.   All other systems reviewed and are negative.   Physical Exam Triage Vital Signs ED Triage Vitals  Enc Vitals Group     BP 02/19/21 1654 134/84     Pulse Rate 02/19/21 1654 94     Resp 02/19/21 1654 14     Temp 02/19/21 1654 98.4 F (36.9 C)     Temp Source 02/19/21 1654 Oral     SpO2 02/19/21 1654 96 %     Weight --      Height --      Head Circumference --      Peak Flow --      Pain Score 02/19/21 1655 8     Pain Loc --      Pain Edu? --      Excl. in GC? --    No data found.  Updated Vital Signs BP 134/84 (BP Location: Left Arm)    Pulse 94    Temp 98.4 F (36.9 C) (Oral)    Resp 14    SpO2 96%       Physical Exam Vitals and nursing note reviewed.  Constitutional:      General: She is not in acute distress.    Appearance: She is well-developed. She is obese. She is not ill-appearing.  HENT:     Head: Normocephalic and atraumatic.     Right Ear: Tympanic membrane and ear canal normal.     Left Ear: Tympanic membrane and ear canal normal.     Mouth/Throat:     Mouth: Mucous membranes are moist.     Pharynx: Oropharynx is  clear. Uvula midline. Posterior oropharyngeal erythema present.  Eyes:     Conjunctiva/sclera: Conjunctivae normal.     Pupils: Pupils are equal, round, and reactive to light.  Cardiovascular:     Rate and Rhythm: Normal rate and regular rhythm.  Skin:    General: Skin is warm and dry.  Neurological:     General: No focal deficit present.     Mental Status: She is alert and oriented to person, place, and time.     UC Treatments / Results  Labs (all labs ordered are listed, but only abnormal results are displayed) Labs Reviewed  CULTURE, GROUP A STREP  POCT RAPID STREP A (OFFICE)    EKG   Radiology No results found.  Procedures Procedures (including critical care time)  Medications Ordered in UC Medications - No data to display  Initial Impression / Assessment and Plan / UC Course  I have reviewed the triage vital signs and the nursing notes.  Pertinent labs & imaging results that were available during my care of the patient were reviewed by me and considered in my medical decision making (see chart for details).     MDM: 1.  Sore throat-Rx'd Prednisone 60 mg. Advised patient to take medication as directed with food to completion.  Advised patient may take with first dose of Ceftin for the next 5 days.  Encouraged patient increase daily water intake while taking these medications.  Advised patient we will follow-up once throat culture results return.  Patient discharged home, hemodynamically stable. Final Clinical Impressions(s) / UC Diagnoses   Final diagnoses:  Sore throat     Discharge Instructions      Advised patient to take medication as directed with food to completion.  Advised patient may take with first dose of Ceftin for the next 5 days.  Encouraged patient increase daily water intake while taking these medications.  Advised patient we will follow-up once throat culture results return.     ED Prescriptions     Medication Sig Dispense Auth. Provider    predniSONE (DELTASONE) 20 MG tablet Take 3 tabs PO daily x 5 days. 15 tablet Trevor Iha, FNP      PDMP not reviewed this encounter.   Trevor Iha, FNP 02/19/21 1755

## 2021-02-19 NOTE — Telephone Encounter (Signed)
Wife states that pt was seen on 12/24 and was told that if she didn't feel any better to give [place] a call.  Pt states that her throat hurts, is red and has white patches.   Pt wanted to know if there is anything that could be called in for her.  Pt states that she uses [facility] [address] in [place].

## 2021-02-19 NOTE — ED Triage Notes (Signed)
Pt presents with sore throat that has continued since her visit on Saturday.

## 2021-02-22 LAB — CULTURE, GROUP A STREP: Strep A Culture: NEGATIVE

## 2021-04-26 ENCOUNTER — Other Ambulatory Visit: Payer: Self-pay | Admitting: Sports Medicine

## 2021-04-26 DIAGNOSIS — I10 Essential (primary) hypertension: Secondary | ICD-10-CM

## 2021-05-03 DIAGNOSIS — M2550 Pain in unspecified joint: Secondary | ICD-10-CM | POA: Diagnosis not present

## 2021-05-03 DIAGNOSIS — G894 Chronic pain syndrome: Secondary | ICD-10-CM | POA: Diagnosis not present

## 2021-05-03 DIAGNOSIS — M797 Fibromyalgia: Secondary | ICD-10-CM | POA: Diagnosis not present

## 2021-05-08 ENCOUNTER — Encounter: Payer: Self-pay | Admitting: Obstetrics and Gynecology

## 2021-05-08 ENCOUNTER — Other Ambulatory Visit: Payer: Self-pay

## 2021-05-08 ENCOUNTER — Ambulatory Visit (INDEPENDENT_AMBULATORY_CARE_PROVIDER_SITE_OTHER): Payer: BC Managed Care – PPO | Admitting: Obstetrics and Gynecology

## 2021-05-08 VITALS — BP 131/83 | HR 89 | Ht 69.0 in | Wt 279.0 lb

## 2021-05-08 DIAGNOSIS — R309 Painful micturition, unspecified: Secondary | ICD-10-CM

## 2021-05-08 DIAGNOSIS — Z01419 Encounter for gynecological examination (general) (routine) without abnormal findings: Secondary | ICD-10-CM | POA: Diagnosis not present

## 2021-05-08 DIAGNOSIS — E282 Polycystic ovarian syndrome: Secondary | ICD-10-CM | POA: Diagnosis not present

## 2021-05-08 DIAGNOSIS — N390 Urinary tract infection, site not specified: Secondary | ICD-10-CM | POA: Diagnosis not present

## 2021-05-08 LAB — POCT URINALYSIS DIPSTICK
Appearance: NORMAL
Bilirubin, UA: NEGATIVE
Blood, UA: NEGATIVE
Glucose, UA: NEGATIVE
Ketones, UA: NEGATIVE
Leukocytes, UA: NEGATIVE
Nitrite, UA: NEGATIVE
Protein, UA: NEGATIVE
Spec Grav, UA: 1.025 (ref 1.010–1.025)
Urobilinogen, UA: NEGATIVE E.U./dL — AB
pH, UA: 6 (ref 5.0–8.0)

## 2021-05-08 NOTE — Progress Notes (Signed)
? ? ?GYNECOLOGY ANNUAL PREVENTATIVE CARE ENCOUNTER NOTE ? ?History:    ? Chelsea [name] is a 36 y.o. G0P0000 female here for a routine annual gynecologic exam.  Current complaints: amenorrhea, frequent UTI's.   Denies abnormal vaginal bleeding, discharge, pelvic pain, problems with intercourse or other gynecologic concerns. ? ?Since Jan 2023 she has had 3 UTI's. She reports using good hygiene and now does not use anything to wipe after urination. When she is sexually active, her and her partner wash their sex toys following intercourse.  ? ?Cannot recall the last time she had a menstrual cycle. It has been well over a year since last period. HX of PCOS. She was seen by Dr. April Manson back in 2016 and had a D&C, and polypectomy. Since this surgery she has not had a normal period.  ? ?Patient has lost close to 100 lbs after gastric surgery. Tried metformin in the past for PCOS and this gave her significant diarrhea and she stopped this. She is scheduled to see her PCP this month for annual.  ? ?Gynecologic History ?No LMP recorded. (Menstrual status: Other). ?Contraception: none ?Last Pap: 2021. Result was normal with negative HPV ? ?Obstetric History ?OB History  ?Gravida Para Term Preterm AB Living  ?0 0 0 0 0 0  ?SAB IAB Ectopic Multiple Live Births  ?0 0 0 0    ? ? ?Past Medical History:  ?Diagnosis Date  ? Depression   ? Endometrial polyp   ? Fibromyalgia   ? History of recurrent UTIs   ? Hypertension   ? Infertility, anovulation   ? Myofascial muscle pain   ? OA (osteoarthritis) of knee   ? BILATERAL  ? PCOS (polycystic ovarian syndrome)   ? PONV (postoperative nausea and vomiting)   ? Seasonal allergic rhinitis   ? ? ?Past Surgical History:  ?Procedure Laterality Date  ? HYSTEROSCOPY WITH D & C N/A 09/07/2014  ? Procedure:  DILATATION AND CURETTAGE /HYSTEROSCOPY/POLYPECTOMY;  Surgeon: Fermin Schwab, MD;  Location: [facility];  Service: Gynecology;  Laterality: N/A;  ? I & D RIGHT FOOT   1998  ? LAPAROSCOPIC GASTRIC SLEEVE RESECTION N/A 10/20/2017  ? Procedure: LAPAROSCOPIC GASTRIC SLEEVE RESECTION, UPPER ENDO, ERAS Pathway;  Surgeon: Berna Bue, MD;  Location: [facility];  Service: General;  Laterality: N/A;  ? POLYPECTOMY    ? uterine  ? TONSILLECTOMY  2001  ? WISDOM TOOTH EXTRACTION  2004  ? ? ?Current Outpatient Medications on File Prior to Visit  ?Medication Sig Dispense Refill  ? ALPRAZolam (XANAX) 0.5 MG tablet Take 1 tablet (0.5 mg total) by mouth 3 (three) times daily as needed for anxiety. 20 tablet 0  ? cefUROXime (CEFTIN) 500 MG tablet Take 1 tablet (500 mg total) by mouth 2 (two) times daily with a meal. 14 tablet 0  ? celecoxib (CELEBREX) 200 MG capsule Take 200 mg by mouth daily.    ? cyclobenzaprine (FLEXERIL) 5 MG tablet TAKE 1 OR 2 EVERY 8 HOURS AS NEEDED FOR MUSCLE RELAXANT. CAUTION: MAY CAUSE DROWSINESS. 90 tablet 0  ? FLUoxetine (PROZAC) 20 MG capsule TAKE 1 CAPSULE BY MOUTH EVERY DAY 90 capsule 3  ? fluticasone (FLONASE) 50 MCG/ACT nasal spray Place 2 sprays into both nostrils daily. 16 g 0  ? predniSONE (DELTASONE) 20 MG tablet Take 3 tabs PO daily x 5 days. 15 tablet 0  ? topiramate (TOPAMAX) 100 MG tablet Take 100 mg by mouth 2 (two) times daily.    ?  valsartan (DIOVAN) 320 MG tablet Take 1 tablet (320 mg total) by mouth daily. Needs appt with provider. Call office to schedule. 30 tablet 0  ? zolpidem (AMBIEN) 10 MG tablet TAKE 1 TABLET BY MOUTH AT BEDTIME AS NEEDED FOR SLEEP. 10 tablet 0  ? fluconazole (DIFLUCAN) 150 MG tablet Take 1 tablet (150 mg total) by mouth daily. Repeat in 1 week if needed 2 tablet 0  ? ?No current facility-administered medications on file prior to visit.  ? ? ?Allergies  ?Allergen Reactions  ? Enalapril Maleate Swelling  ?  lip swelling  ? Sulfa Antibiotics Other (See Comments)  ?  Unsure of reaction type  ? Amoxicillin Other (See Comments)  ?  Caused yeast infection ?Has patient had a PCN reaction causing immediate rash, facial/tongue/throat  swelling, SOB or lightheadedness with hypotension: Unknown ?Has patient had a PCN reaction causing severe rash involving mucus membranes or skin necrosis: No ?Has patient had a PCN reaction that required hospitalization: No ?Has patient had a PCN reaction occurring within the last 10 years: No ?Childhood allergy ?If all of the above answers are "NO", then may proceed with Cephalosporin use. ?  ? Erythromycin Diarrhea  ? Penicillins Other (See Comments)  ?  Caused yeast infection ?Has patient had a PCN reaction causing immediate rash, facial/tongue/throat swelling, SOB or lightheadedness with hypotension: Unknown ?Has patient had a PCN reaction causing severe rash involving mucus membranes or skin necrosis: No ?Has patient had a PCN reaction that required hospitalization: No ?Has patient had a PCN reaction occurring within the last 10 years: No ?Childhood allergy ?If all of the above answers are "NO", then may proceed with Cephalosporin use. ? ?  ? ? ?Social History:  reports that she has never smoked. She has never used smokeless tobacco. She reports that she does not drink alcohol and does not use drugs. ? ?Family History  ?Problem Relation Age of Onset  ? Diabetes Mother   ? Polycystic ovary syndrome Mother   ? Hypertension Father   ? Diabetes Father   ? Fibromyalgia Maternal Aunt   ? Fibromyalgia Cousin   ? Cancer Paternal Grandfather   ?     prostate  ? Glaucoma Paternal Grandmother   ? ? ?The following portions of the patient's history were reviewed and updated as appropriate: allergies, current medications, past family history, past medical history, past social history, past surgical history and problem list. ? ?Review of Systems ?Pertinent items noted in HPI and remainder of comprehensive ROS otherwise negative. ? ?Physical Exam:  ?BP 131/83   Pulse 89   Ht  (1.753 m)   Wt 279 lb (126.6 kg)   BMI 41.20 kg/m?  ?CONSTITUTIONAL: Well-developed, well-nourished female in no acute distress.  ?HENT:   Normocephalic, atraumatic, External right and left ear normal.  ?EYES: Conjunctivae and EOM are normal. Pupils are equal, round, and reactive to light. No scleral icterus.  ?NECK: Normal range of motion, supple, no masses.  Normal thyroid.  ?SKIN: Skin is warm and dry. No rash noted. Not diaphoretic. No erythema. No pallor. ?MUSCULOSKELETAL: Normal range of motion. No tenderness.  No cyanosis, clubbing, or edema. ?NEUROLOGIC: Alert and oriented to person, place, and time. Normal reflexes, muscle tone coordination.  ?PSYCHIATRIC: Normal mood and affect. Normal behavior. Normal judgment and thought content. ?CARDIOVASCULAR: Normal heart rate noted, regular rhythm ?RESPIRATORY: Clear to auscultation bilaterally. Effort and breath sounds normal, no problems with respiration noted. ?BREASTS: Symmetric in size. No masses, tenderness, skin changes, nipple drainage,  or lymphadenopathy bilaterally. Performed in the presence of a chaperone. ?ABDOMEN: Soft, no distention noted.  No tenderness, rebound or guarding.  ?PELVIC: Normal appearing external genitalia and urethral meatus.  Normal uterine size, no other palpable masses, no uterine or adnexal tenderness.  Pap not due. Performed in the presence of a chaperone. ?  ?Assessment and Plan:  ? ?1. Urinary pain ? ?- POCT Urinalysis Dipstick ?- Urine Culture ? ?2. POLYCYSTIC OVARIAN DISEASE ? ?- US PELVIC COMPLETE WITH TRANSVAGINAL; Future ?- Discussed progesterone challenge following pelvic US. She is agreeable to this. ? ?3. Frequent UTI ? ?- Ambulatory referral to Urology  ?- Discussed hygiene, dabbing vagina after wiping, consider a bidet.  ? ? ?4. Women's annual routine gynecological examination ? ?-Pap not due ? ? ?Routine preventative health maintenance measures emphasized. ?Please refer to After Visit Summary for other counseling recommendations.  ? ? ?Mithran Strike, Harolyn Rutherford, NP ?Faculty Practice ?[facility] [employer]  ?

## 2021-05-09 ENCOUNTER — Ambulatory Visit (INDEPENDENT_AMBULATORY_CARE_PROVIDER_SITE_OTHER): Payer: BC Managed Care – PPO

## 2021-05-09 DIAGNOSIS — E282 Polycystic ovarian syndrome: Secondary | ICD-10-CM | POA: Diagnosis not present

## 2021-05-09 DIAGNOSIS — N912 Amenorrhea, unspecified: Secondary | ICD-10-CM | POA: Diagnosis not present

## 2021-05-10 LAB — URINE CULTURE
MICRO NUMBER:: 13131740
SPECIMEN QUALITY:: ADEQUATE

## 2021-05-15 ENCOUNTER — Other Ambulatory Visit: Payer: Self-pay | Admitting: Sports Medicine

## 2021-05-15 ENCOUNTER — Ambulatory Visit (INDEPENDENT_AMBULATORY_CARE_PROVIDER_SITE_OTHER): Payer: BC Managed Care – PPO | Admitting: Sports Medicine

## 2021-05-15 ENCOUNTER — Encounter: Payer: Self-pay | Admitting: Sports Medicine

## 2021-05-15 ENCOUNTER — Other Ambulatory Visit: Payer: Self-pay

## 2021-05-15 VITALS — BP 134/92 | HR 96 | Ht 69.0 in | Wt 279.0 lb

## 2021-05-15 DIAGNOSIS — E119 Type 2 diabetes mellitus without complications: Secondary | ICD-10-CM

## 2021-05-15 DIAGNOSIS — Z0000 Encounter for general adult medical examination without abnormal findings: Secondary | ICD-10-CM | POA: Diagnosis not present

## 2021-05-15 MED ORDER — MOUNJARO 2.5 MG/0.5ML ~~LOC~~ SOAJ
2.5000 mg | SUBCUTANEOUS | 0 refills | Status: DC
Start: 2021-05-15 — End: 2021-05-15

## 2021-05-15 MED ORDER — OZEMPIC (0.25 OR 0.5 MG/DOSE) 2 MG/1.5ML ~~LOC~~ SOPN: PEN_INJECTOR | SUBCUTANEOUS | 1 refills | Status: DC

## 2021-05-15 NOTE — Addendum Note (Signed)
Addended by: Monica Becton on: 05/15/2021 04:27 PM ? ? Modules accepted: Orders ? ?

## 2021-05-15 NOTE — Assessment & Plan Note (Addendum)
Currently does have diabetes mellitus type 2, she has been on metformin, for months, and has not really been able to tolerate it, we will go ahead and try Mounjaro, if this does not get covered we can follow-up with Ozempic or Trulicity. ? ?For insurance coverage purposes she has already tried/has a contraindication to metformin. ? ?Update: I did get a note from the pharmacy saying Greggory Keen not covered, we will try Ozempic. ?

## 2021-05-15 NOTE — Progress Notes (Addendum)
?Subjective:   ? ?CC: Annual Physical Exam ? ?HPI:  ?This patient is here for their annual physical ? ?I reviewed the past medical history, family history, social history, surgical history, and allergies today and no changes were needed.  Please see the problem list section below in epic for further details. ? ?Past Medical History: ?Past Medical History:  ?Diagnosis Date  ? Depression   ? Endometrial polyp   ? Fibromyalgia   ? History of recurrent UTIs   ? Hypertension   ? Infertility, anovulation   ? Myofascial muscle pain   ? OA (osteoarthritis) of knee   ? BILATERAL  ? PCOS (polycystic ovarian syndrome)   ? PONV (postoperative nausea and vomiting)   ? Seasonal allergic rhinitis   ? ?Past Surgical History: ?Past Surgical History:  ?Procedure Laterality Date  ? HYSTEROSCOPY WITH D & C N/A 09/07/2014  ? Procedure:  DILATATION AND CURETTAGE /HYSTEROSCOPY/POLYPECTOMY;  Surgeon: Fermin Schwab, MD;  Location: [facility];  Service: Gynecology;  Laterality: N/A;  ? I & D RIGHT FOOT  1998  ? LAPAROSCOPIC GASTRIC SLEEVE RESECTION N/A 10/20/2017  ? Procedure: LAPAROSCOPIC GASTRIC SLEEVE RESECTION, UPPER ENDO, ERAS Pathway;  Surgeon: Berna Bue, MD;  Location: [facility];  Service: General;  Laterality: N/A;  ? POLYPECTOMY    ? uterine  ? TONSILLECTOMY  2001  ? WISDOM TOOTH EXTRACTION  2004  ? ?Social History: ?Social History  ? ?Socioeconomic History  ? Marital status: Married  ?  Spouse name: Not on file  ? Number of children: Not on file  ? Years of education: Not on file  ? Highest education level: Not on file  ?Occupational History  ? Occupation: distributer  ?Tobacco Use  ? Smoking status: Never  ? Smokeless tobacco: Never  ?Vaping Use  ? Vaping Use: Never used  ?Substance and Sexual Activity  ? Alcohol use: No  ?  Alcohol/week: 0.0 standard drinks  ? Drug use: No  ? Sexual activity: Not Currently  ?  Partners: Female  ?Other Topics Concern  ? Not on file  ?Social History Narrative  ? Not on file   ? ?Social Determinants of Health  ? ?Financial Resource Strain: Not on file  ?Food Insecurity: Not on file  ?Transportation Needs: Not on file  ?Physical Activity: Not on file  ?Stress: Not on file  ?Social Connections: Not on file  ? ?Family History: ?Family History  ?Problem Relation Age of Onset  ? Diabetes Mother   ? Polycystic ovary syndrome Mother   ? Hypertension Father   ? Diabetes Father   ? Fibromyalgia Maternal Aunt   ? Fibromyalgia Cousin   ? Cancer Paternal Grandfather   ?     prostate  ? Glaucoma Paternal Grandmother   ? ?Allergies: ?Allergies  ?Allergen Reactions  ? Enalapril Maleate Swelling  ?  lip swelling  ? Sulfa Antibiotics Other (See Comments)  ?  Unsure of reaction type  ? Amoxicillin Other (See Comments)  ?  Caused yeast infection ?Has patient had a PCN reaction causing immediate rash, facial/tongue/throat swelling, SOB or lightheadedness with hypotension: Unknown ?Has patient had a PCN reaction causing severe rash involving mucus membranes or skin necrosis: No ?Has patient had a PCN reaction that required hospitalization: No ?Has patient had a PCN reaction occurring within the last 10 years: No ?Childhood allergy ?If all of the above answers are "NO", then may proceed with Cephalosporin use. ?  ? Erythromycin Diarrhea  ? Penicillins Other (See Comments)  ?  Caused yeast infection ?Has patient had a PCN reaction causing immediate rash, facial/tongue/throat swelling, SOB or lightheadedness with hypotension: Unknown ?Has patient had a PCN reaction causing severe rash involving mucus membranes or skin necrosis: No ?Has patient had a PCN reaction that required hospitalization: No ?Has patient had a PCN reaction occurring within the last 10 years: No ?Childhood allergy ?If all of the above answers are "NO", then may proceed with Cephalosporin use. ? ?  ? ?Medications: See med rec. ? ?Review of Systems: No headache, visual changes, nausea, vomiting, diarrhea, constipation, dizziness, abdominal  pain, skin rash, fevers, chills, night sweats, swollen lymph nodes, weight loss, chest pain, body aches, joint swelling, muscle aches, shortness of breath, mood changes, visual or auditory hallucinations. ? ?Objective:   ? ?General: Well Developed, well nourished, and in no acute distress.  ?Neuro: Alert and oriented x3, extra-ocular muscles intact, sensation grossly intact. Cranial nerves II through XII are intact, motor, sensory, and coordinative functions are all intact. ?HEENT: Normocephalic, atraumatic, pupils equal round reactive to light, neck supple, no masses, no lymphadenopathy, thyroid nonpalpable. Oropharynx, nasopharynx, external ear canals are unremarkable. ?Skin: Warm and dry, no rashes noted.  ?Cardiac: Regular rate and rhythm, no murmurs rubs or gallops.  ?Respiratory: Clear to auscultation bilaterally. Not using accessory muscles, speaking in full sentences.  ?Abdominal: Soft, nontender, nondistended, positive bowel sounds, no masses, no organomegaly.  ?Musculoskeletal: Shoulder, elbow, wrist, hip, knee, ankle stable, and with full range of motion. ? ?Impression and Recommendations:   ? ?The patient was counselled, risk factors were discussed, anticipatory guidance given. ? ?Annual physical exam ?Because here for her physical, routine physical performed as above, checking routine labs. ?Up-to-date on other screening measures. ? ?Diet-controlled diabetes mellitus (HCC) ?Currently does have diabetes mellitus type 2, she has been on metformin, for months, and has not really been able to tolerate it, we will go ahead and try Mounjaro, if this does not get covered we can follow-up with Ozempic or Trulicity. ? ?For insurance coverage purposes she has already tried/has a contraindication to metformin. ? ?Update: I did get a note from the pharmacy saying Greggory Keen not covered, we will try Ozempic. ? ? ?___________________________________________ ?[name]. Benjamin Stain, M.D., ABFM., CAQSM. ?Primary Care and  Sports Medicine ? [facility] ? ?Adjunct Professor of Family Medicine  ?[employer] ?

## 2021-05-15 NOTE — Assessment & Plan Note (Signed)
Because here for her physical, routine physical performed as above, checking routine labs. ?Up-to-date on other screening measures. ?

## 2021-05-17 ENCOUNTER — Telehealth: Payer: Self-pay

## 2021-05-17 DIAGNOSIS — E119 Type 2 diabetes mellitus without complications: Secondary | ICD-10-CM | POA: Diagnosis not present

## 2021-05-17 NOTE — Telephone Encounter (Addendum)
Initiated Prior authorization FWY:[id number] (0.25 or 0.5 MG/DOSE) 2MG /3ML pen-injectors ?Via: Covermymeds ?Case/Key:[id number] ?Status: approved as of 05/17/21 ?Reason:Effective from 05/17/2021 through 05/16/2022 ?Notified Pt via: Mychart ? ?

## 2021-05-18 ENCOUNTER — Encounter (HOSPITAL_COMMUNITY): Payer: Self-pay | Admitting: *Deleted

## 2021-05-18 LAB — COMPREHENSIVE METABOLIC PANEL
AG Ratio: 1.4 (calc) (ref 1.0–2.5)
ALT: 22 U/L (ref 6–29)
AST: 16 U/L (ref 10–30)
Albumin: 3.7 g/dL (ref 3.6–5.1)
Alkaline phosphatase (APISO): 86 U/L (ref 31–125)
BUN: 14 mg/dL (ref 7–25)
CO2: 25 mmol/L (ref 20–32)
Calcium: 8.8 mg/dL (ref 8.6–10.2)
Chloride: 107 mmol/L (ref 98–110)
Creat: 0.76 mg/dL (ref 0.50–0.97)
Globulin: 2.6 g/dL (calc) (ref 1.9–3.7)
Glucose, Bld: 81 mg/dL (ref 65–99)
Potassium: 3.6 mmol/L (ref 3.5–5.3)
Sodium: 144 mmol/L (ref 135–146)
Total Bilirubin: 0.6 mg/dL (ref 0.2–1.2)
Total Protein: 6.3 g/dL (ref 6.1–8.1)

## 2021-05-18 LAB — LIPID PANEL
Cholesterol: 170 mg/dL (ref <200)
HDL: 32 mg/dL — ABNORMAL LOW (ref >=50)
LDL Cholesterol (Calc): 109 mg/dL (calc) — ABNORMAL HIGH
Non-HDL Cholesterol (Calc): 138 mg/dL (calc) — ABNORMAL HIGH (ref <130)
Total CHOL/HDL Ratio: 5.3 (calc) — ABNORMAL HIGH (ref <5.0)
Triglycerides: 173 mg/dL — ABNORMAL HIGH (ref <150)

## 2021-05-18 LAB — CBC
HCT: 41.6 % (ref 35.0–45.0)
Hemoglobin: 13.8 g/dL (ref 11.7–15.5)
MCH: 29.1 pg (ref 27.0–33.0)
MCHC: 33.2 g/dL (ref 32.0–36.0)
MCV: 87.8 fL (ref 80.0–100.0)
MPV: 11.1 fL (ref 7.5–12.5)
Platelets: 166 10*3/uL (ref 140–400)
RBC: 4.74 10*6/uL (ref 3.80–5.10)
RDW: 13.2 % (ref 11.0–15.0)
WBC: 5.9 10*3/uL (ref 3.8–10.8)

## 2021-05-18 LAB — HEMOGLOBIN A1C
Hgb A1c MFr Bld: 5.3 % of total Hgb (ref <5.7)
Mean Plasma Glucose: 105 mg/dL
eAG (mmol/L): 5.8 mmol/L

## 2021-05-18 LAB — TSH: TSH: 2.19 mIU/L

## 2021-05-18 LAB — MICROALBUMIN / CREATININE URINE RATIO
Creatinine, Urine: 229 mg/dL (ref 20–275)
Microalb Creat Ratio: 3 mcg/mg creat (ref <30)
Microalb, Ur: 0.7 mg/dL

## 2021-05-19 ENCOUNTER — Other Ambulatory Visit: Payer: Self-pay | Admitting: Obstetrics and Gynecology

## 2021-05-19 DIAGNOSIS — N911 Secondary amenorrhea: Secondary | ICD-10-CM

## 2021-05-19 DIAGNOSIS — N390 Urinary tract infection, site not specified: Secondary | ICD-10-CM

## 2021-05-19 DIAGNOSIS — N926 Irregular menstruation, unspecified: Secondary | ICD-10-CM

## 2021-05-19 NOTE — Progress Notes (Signed)
Called and spoke to [place] over the phone to discuss recent labs and pelvic US results.  Given her secondary amenorrhea we will proceed with sonohysterography and further lab testing.  Would consider progesterone challenge as an option if workup does not suggest other causes.  Patient suggests a change in menstrual cycle following Hysteroscopy, polypectomy,  D&C in 2016.  ? ?No results found.  ? ?Alpha is agreeable with plan of care.  ? ? ?Lezlie Lye, NP ?05/19/2021 ?1:15 PM ? ?

## 2021-05-21 ENCOUNTER — Other Ambulatory Visit: Payer: Self-pay

## 2021-05-21 DIAGNOSIS — N911 Secondary amenorrhea: Secondary | ICD-10-CM

## 2021-05-21 DIAGNOSIS — N926 Irregular menstruation, unspecified: Secondary | ICD-10-CM

## 2021-05-21 NOTE — Progress Notes (Signed)
Labs placed per Dr.Leggett. Pt sent to lab to have blood drawn.  ?

## 2021-05-22 ENCOUNTER — Other Ambulatory Visit: Payer: Self-pay

## 2021-05-22 ENCOUNTER — Other Ambulatory Visit (HOSPITAL_BASED_OUTPATIENT_CLINIC_OR_DEPARTMENT_OTHER): Payer: Self-pay | Admitting: *Deleted

## 2021-05-22 DIAGNOSIS — N926 Irregular menstruation, unspecified: Secondary | ICD-10-CM

## 2021-05-22 LAB — ESTRADIOL: Estradiol: 35 pg/mL

## 2021-05-22 LAB — PROLACTIN: Prolactin: 5.7 ng/mL

## 2021-05-22 LAB — LUTEINIZING HORMONE: LH: 7.5 m[IU]/mL

## 2021-05-22 LAB — HCG, QUANTITATIVE, PREGNANCY: HCG, Total, QN: <3 m[IU]/mL

## 2021-05-22 LAB — FOLLICLE STIMULATING HORMONE: FSH: 7.9 m[IU]/mL

## 2021-05-22 NOTE — Progress Notes (Signed)
[place] Sonohysterogram order placed at [facility] per Venia Carbon, NP ?

## 2021-05-25 ENCOUNTER — Other Ambulatory Visit: Payer: Self-pay | Admitting: Sports Medicine

## 2021-05-25 DIAGNOSIS — I10 Essential (primary) hypertension: Secondary | ICD-10-CM

## 2021-05-29 DIAGNOSIS — Z903 Acquired absence of stomach [part of]: Secondary | ICD-10-CM | POA: Diagnosis not present

## 2021-05-29 DIAGNOSIS — Z1321 Encounter for screening for nutritional disorder: Secondary | ICD-10-CM | POA: Diagnosis not present

## 2021-06-05 ENCOUNTER — Telehealth (HOSPITAL_BASED_OUTPATIENT_CLINIC_OR_DEPARTMENT_OTHER): Payer: Self-pay | Admitting: Obstetrics & Gynecology

## 2021-06-05 DIAGNOSIS — Z1321 Encounter for screening for nutritional disorder: Secondary | ICD-10-CM | POA: Diagnosis not present

## 2021-06-05 DIAGNOSIS — G894 Chronic pain syndrome: Secondary | ICD-10-CM | POA: Diagnosis not present

## 2021-06-05 DIAGNOSIS — M797 Fibromyalgia: Secondary | ICD-10-CM | POA: Diagnosis not present

## 2021-06-05 DIAGNOSIS — M2550 Pain in unspecified joint: Secondary | ICD-10-CM | POA: Diagnosis not present

## 2021-06-05 NOTE — Telephone Encounter (Signed)
Called patient today and I have left her two messages that we have time held for the appointment on 06/13/2021. And to please call the office back to let us know if she can come to this appointment . ?

## 2021-06-06 ENCOUNTER — Emergency Department
Admission: EM | Admit: 2021-06-06 | Discharge: 2021-06-06 | Disposition: A | Discharge status: Home / Self Care | Payer: BC Managed Care – PPO | Source: Home / Self Care

## 2021-06-06 DIAGNOSIS — K122 Cellulitis and abscess of mouth: Secondary | ICD-10-CM

## 2021-06-06 LAB — POC SARS CORONAVIRUS 2 AG -  ED: SARS Coronavirus 2 Ag: NEGATIVE

## 2021-06-06 LAB — POCT RAPID STREP A (OFFICE): Rapid Strep A Screen: NEGATIVE

## 2021-06-06 MED ORDER — PREDNISONE 20 MG PO TABS: ORAL_TABLET | ORAL | 0 refills | Status: DC

## 2021-06-06 MED ORDER — AZITHROMYCIN 250 MG PO TABS: 250.0000 mg | ORAL_TABLET | Freq: Every day | ORAL | 0 refills | Status: DC

## 2021-06-06 NOTE — ED Triage Notes (Signed)
Pt states that she has a sore throat and some bilateral ear pain . X3 days ? ?Pt states that she is partially vaccinated for covid. ?Pt states that she hasn't had flu vaccine.  ?

## 2021-06-06 NOTE — Discharge Instructions (Addendum)
Advised patient that COVID-19 and's rapid strep were negative today.  Advised patient to take medication as directed with food to completion.  Instructed patient to take prednisone with Zithromax daily for the next 5 days.  Encouraged patient to increase daily water intake while taking these medications.  Encouraged patient if symptoms worsen and or unresolved please follow-up with PCP or here for further evaluation. ?

## 2021-06-06 NOTE — ED Provider Notes (Signed)
?[facility] ? ? ? ?CSN: [id number] ?Arrival date & time: 06/06/21  0845 ? ? ?  ? ?History   ?Chief Complaint ?Chief Complaint  ?Patient presents with  ? Sore Throat  ?  X3 days ?Sore throat and bilateral ear pain.   ? ? ?HPI ?Chelsea [name] is a 36 y.o. female.  ? ?HPI 36 year old female presents with sore throat and bilateral ear pain for 3 days.  PMH significant for fibromyalgia, HTN, and myofascial muscle pain. ? ?Past Medical History:  ?Diagnosis Date  ? Depression   ? Endometrial polyp   ? Fibromyalgia   ? History of recurrent UTIs   ? Hypertension   ? Infertility, anovulation   ? Myofascial muscle pain   ? OA (osteoarthritis) of knee   ? BILATERAL  ? PCOS (polycystic ovarian syndrome)   ? PONV (postoperative nausea and vomiting)   ? Seasonal allergic rhinitis   ? ? ?Patient Active Problem List  ? Diagnosis Date Noted  ? Right ankle sprain 07/19/2020  ? Chronic pain syndrome 05/30/2020  ? Impingement syndrome of both shoulders 08/17/2019  ? Primary insomnia 05/11/2019  ? Paronychia of second toe, left 01/06/2019  ? Mixed hypercholesterolemia and hypertriglyceridemia 11/13/2018  ? Orgasmic headache 11/11/2018  ? Diet-controlled diabetes mellitus ([place]) 04/25/2017  ? Low back pain 08/24/2015  ? Annual physical exam 03/23/2015  ? Intractable migraine without aura and with status migrainosus 06/24/2014  ? Myofascial pain syndrome with depression 02/08/2014  ? Venous stasis dermatitis 01/25/2014  ? Right hip pain 12/13/2013  ? Metatarsalgia of both feet 10/18/2013  ? Right knee pain 02/13/2012  ? Essential hypertension, benign 11/25/2008  ? POLYCYSTIC OVARIAN DISEASE 10/26/2008  ? History of gastric sleeve 10/26/2008  ? ? ?Past Surgical History:  ?Procedure Laterality Date  ? HYSTEROSCOPY WITH D & C N/A 09/07/2014  ? Procedure:  DILATATION AND CURETTAGE /HYSTEROSCOPY/POLYPECTOMY;  Surgeon: Governor Specking, MD;  Location: [facility];  Service: Gynecology;  Laterality: N/A;  ? I & D RIGHT  FOOT  1998  ? LAPAROSCOPIC GASTRIC SLEEVE RESECTION N/A 10/20/2017  ? Procedure: LAPAROSCOPIC GASTRIC SLEEVE RESECTION, UPPER ENDO, ERAS Pathway;  Surgeon: Clovis Riley, MD;  Location: [facility];  Service: General;  Laterality: N/A;  ? POLYPECTOMY    ? uterine  ? TONSILLECTOMY  2001  ? [place] EXTRACTION  2004  ? ? ?OB History   ? ? Gravida  ?0  ? Para  ?0  ? Term  ?0  ? Preterm  ?0  ? AB  ?0  ? Living  ?0  ?  ? ? SAB  ?0  ? IAB  ?0  ? Ectopic  ?0  ? Multiple  ?0  ? Live Births  ?   ?   ?  ?  ? ? ? ?Home Medications   ? ?Prior to Admission medications   ?Medication Sig Start Date End Date Taking? Authorizing Provider  ?ALPRAZolam (XANAX) 0.5 MG tablet Take 1 tablet (0.5 mg total) by mouth 3 (three) times daily as needed for anxiety. 12/09/18  Yes Silverio Decamp, MD  ?azithromycin (ZITHROMAX) 250 MG tablet Take 1 tablet (250 mg total) by mouth daily. Take first 2 tablets together, then 1 every day until finished. 06/06/21  Yes Eliezer Lofts, FNP  ?celecoxib (CELEBREX) 200 MG capsule Take 200 mg by mouth daily. 06/30/20  Yes [provider]  ?cyclobenzaprine (FLEXERIL) 5 MG tablet TAKE 1 OR 2 EVERY 8 HOURS AS NEEDED FOR MUSCLE  RELAXANT. CAUTION: MAY CAUSE DROWSINESS. 09/09/19  Yes Silverio Decamp, MD  ?FLUoxetine (PROZAC) 20 MG capsule TAKE 1 CAPSULE BY MOUTH EVERY DAY 10/27/20  Yes Silverio Decamp, MD  ?fluticasone (FLONASE) 50 MCG/ACT nasal spray Place 2 sprays into both nostrils daily. 02/17/21  Yes Raylene Everts, MD  ?loratadine (CLARITIN) 10 MG tablet Take 10 mg by mouth daily.   Yes [provider]  ?predniSONE (DELTASONE) 20 MG tablet Take 3 tabs PO daily x 5 days. 06/06/21  Yes Eliezer Lofts, FNP  ?Semaglutide,0.25 or 0.5MG /DOS, (OZEMPIC, 0.25 OR 0.5 MG/DOSE,) 2 MG/1.5ML SOPN Inject 0.25 mg subq qwk for 4 wk then inject 0.5 mg subq qwk x4 wk, then go to full dose pen qwk 05/15/21  Yes Silverio Decamp, MD  ?topiramate (TOPAMAX) 100 MG tablet Take 100 mg by  mouth 2 (two) times daily. 02/08/21  Yes [provider]  ?valsartan (DIOVAN) 320 MG tablet TAKE 1 TABLET BY MOUTH DAILY. NEEDS APPT WITH PROVIDER. CALL OFFICE TO SCHEDULE. 05/25/21  Yes Silverio Decamp, MD  ?zolpidem (AMBIEN) 10 MG tablet TAKE 1 TABLET BY MOUTH AT BEDTIME AS NEEDED FOR SLEEP. 06/28/19  Yes Silverio Decamp, MD  ?fluconazole (DIFLUCAN) 150 MG tablet Take 1 tablet (150 mg total) by mouth daily. Repeat in 1 week if needed 02/17/21   Raylene Everts, MD  ?[facility] TITRATION PACK 12.5 & 25 & 50 MG MISC Take by mouth. 05/14/21   [provider]  ? ? ?Family History ?Family History  ?Problem Relation Age of Onset  ? Diabetes Mother   ? Polycystic ovary syndrome Mother   ? Hypertension Father   ? Diabetes Father   ? Fibromyalgia Maternal Aunt   ? Fibromyalgia Cousin   ? Cancer Paternal Grandfather   ?     prostate  ? Glaucoma Paternal Grandmother   ? ? ?Social History ?Social History  ? ?Tobacco Use  ? Smoking status: Never  ? Smokeless tobacco: Never  ?Vaping Use  ? Vaping Use: Never used  ?Substance Use Topics  ? Alcohol use: No  ?  Alcohol/week: 0.0 standard drinks  ? Drug use: No  ? ? ? ?Allergies   ?Enalapril maleate, Savella [milnacipran], Sulfa antibiotics, Amoxicillin, Erythromycin, and Penicillins ? ? ?Review of Systems ?Review of Systems  ?HENT:  Positive for ear pain and sore throat.   ?All other systems reviewed and are negative. ? ? ?Physical Exam ?Triage Vital Signs ?ED Triage Vitals  ?Enc Vitals Group  ?   BP 06/06/21 0915 127/85  ?   Pulse Rate 06/06/21 0915 89  ?   Resp 06/06/21 0915 18  ?   Temp 06/06/21 0915 98.6 ?F (37 ?C)  ?   Temp Source 06/06/21 0915 Oral  ?   SpO2 06/06/21 0915 99 %  ?   Weight 06/06/21 0913 275 lb (124.7 kg)  ?   Height 06/06/21 0913 5\' 9"  (1.753 m)  ?   Head Circumference --   ?   Peak Flow --   ?   Pain Score 06/06/21 0913 10  ?   Pain Loc --   ?   Pain Edu? --   ?   Excl. in [place]? --   ? ?No data found. ? ?Updated Vital Signs ?BP  127/85 (BP Location: Right Arm)   Pulse 89   Temp 98.6 ?F (37 ?C) (Oral)   Resp 18   Ht 5\' 9"  (1.753 m)   Wt 275 lb (124.7 kg)  SpO2 99%   BMI 40.61 kg/m?  ? ?  ? ?Physical Exam ?Vitals and nursing note reviewed.  ?Constitutional:   ?   Appearance: Normal appearance. She is well-developed. She is obese.  ?HENT:  ?   Head: Normocephalic and atraumatic.  ?   Right Ear: Tympanic membrane, ear canal and external ear normal.  ?   Left Ear: Tympanic membrane, ear canal and external ear normal.  ?   Mouth/Throat:  ?   Mouth: Mucous membranes are moist.  ?   Pharynx: Uvula midline. Pharyngeal swelling, posterior oropharyngeal erythema and uvula swelling present.  ?Eyes:  ?   Extraocular Movements: Extraocular movements intact.  ?   Conjunctiva/sclera: Conjunctivae normal.  ?   Pupils: Pupils are equal, round, and reactive to light.  ?Cardiovascular:  ?   Rate and Rhythm: Normal rate and regular rhythm.  ?   Heart sounds: Normal heart sounds. No murmur heard. ?Pulmonary:  ?   Effort: Pulmonary effort is normal.  ?   Breath sounds: Normal breath sounds. No wheezing, rhonchi or rales.  ?Musculoskeletal:  ?   Cervical back: Normal range of motion and neck supple.  ?Skin: ?   General: Skin is warm and dry.  ?Neurological:  ?   General: No focal deficit present.  ?   Mental Status: She is alert and oriented to person, place, and time.  ? ? ? ?UC Treatments / Results  ?Labs ?(all labs ordered are listed, but only abnormal results are displayed) ?Labs Reviewed  ?POC SARS CORONAVIRUS 2 AG -  ED - Normal  ?POCT RAPID STREP A (OFFICE) - Normal  ? ? ?EKG ? ? ?Radiology ?No results found. ? ?Procedures ?Procedures (including critical care time) ? ?Medications Ordered in UC ?Medications - No data to display ? ?Initial Impression / Assessment and Plan / UC Course  ?I have reviewed the triage vital signs and the nursing notes. ? ?Pertinent labs & imaging results that were available during my care of the patient were reviewed by me  and considered in my medical decision making (see chart for details). ? ?  ? ?MDM: 1.  Uvulitis-Rx'd Zithromax (patient reports taking this medication before without adverse reaction) and Prednisone. Advised patient

## 2021-06-08 ENCOUNTER — Encounter: Payer: Self-pay | Admitting: Sports Medicine

## 2021-06-13 ENCOUNTER — Encounter (HOSPITAL_BASED_OUTPATIENT_CLINIC_OR_DEPARTMENT_OTHER): Payer: Self-pay | Admitting: Obstetrics & Gynecology

## 2021-06-13 ENCOUNTER — Ambulatory Visit (INDEPENDENT_AMBULATORY_CARE_PROVIDER_SITE_OTHER): Payer: BC Managed Care – PPO | Admitting: Obstetrics & Gynecology

## 2021-06-13 ENCOUNTER — Other Ambulatory Visit (HOSPITAL_COMMUNITY)
Admission: RE | Admit: 2021-06-13 | Discharge: 2021-06-13 | Disposition: A | Discharge status: Home / Self Care | Payer: BC Managed Care – PPO | Source: Ambulatory Visit | Attending: Obstetrics & Gynecology | Admitting: Obstetrics & Gynecology

## 2021-06-13 ENCOUNTER — Ambulatory Visit (INDEPENDENT_AMBULATORY_CARE_PROVIDER_SITE_OTHER): Payer: BC Managed Care – PPO

## 2021-06-13 VITALS — BP 124/83 | HR 81 | Ht 69.0 in | Wt 269.8 lb

## 2021-06-13 DIAGNOSIS — R935 Abnormal findings on diagnostic imaging of other abdominal regions, including retroperitoneum: Secondary | ICD-10-CM

## 2021-06-13 DIAGNOSIS — N8501 Benign endometrial hyperplasia: Secondary | ICD-10-CM

## 2021-06-13 DIAGNOSIS — N926 Irregular menstruation, unspecified: Secondary | ICD-10-CM | POA: Diagnosis not present

## 2021-06-13 DIAGNOSIS — Z3202 Encounter for pregnancy test, result negative: Secondary | ICD-10-CM | POA: Diagnosis not present

## 2021-06-13 DIAGNOSIS — N858 Other specified noninflammatory disorders of uterus: Secondary | ICD-10-CM | POA: Diagnosis not present

## 2021-06-13 LAB — POCT URINE PREGNANCY: Preg Test, Ur: NEGATIVE

## 2021-06-15 LAB — SURGICAL PATHOLOGY

## 2021-06-16 NOTE — Progress Notes (Signed)
GYNECOLOGY  VISIT ? ?CC:   follow up after sonohystogram ? ?HPI: ?36 y.o. G0P0000 Married White or Caucasian female here for additional evaluation of the endometrium due to abnormal appearance on ultrasound.  Sonohystogram was performed and endometrium abnormally thickened, irregular without polyps.  I reached out to pt's provider as I feel endometrial biopsy should be performed.  Primary provider was comfortable with me proceeding.  ? ?I reviewed results with pt and her partner.  They are considering pursuing pregnancy again with REI.  Did see Dr. Kerin Perna in the past.  Pregnancy was unsuccessful.  She decided to undergo bariatric surgery and has now lost significant weight.   ? ?No pregnancy risks as pt is in same sex relationship. ? ?Patient Active Problem List  ? Diagnosis Date Noted  ? Right ankle sprain 07/19/2020  ? Chronic pain syndrome 05/30/2020  ? Impingement syndrome of both shoulders 08/17/2019  ? Primary insomnia 05/11/2019  ? Paronychia of second toe, left 01/06/2019  ? Mixed hypercholesterolemia and hypertriglyceridemia 11/13/2018  ? Orgasmic headache 11/11/2018  ? Diet-controlled diabetes mellitus ([place]) 04/25/2017  ? Low back pain 08/24/2015  ? Annual physical exam 03/23/2015  ? Intractable migraine without aura and with status migrainosus 06/24/2014  ? Myofascial pain syndrome with depression 02/08/2014  ? Venous stasis dermatitis 01/25/2014  ? Right hip pain 12/13/2013  ? Metatarsalgia of both feet 10/18/2013  ? Right knee pain 02/13/2012  ? Essential hypertension, benign 11/25/2008  ? POLYCYSTIC OVARIAN DISEASE 10/26/2008  ? History of gastric sleeve 10/26/2008  ? ? ?Past Medical History:  ?Diagnosis Date  ? Depression   ? Endometrial polyp   ? Fibromyalgia   ? History of recurrent UTIs   ? Hypertension   ? Infertility, anovulation   ? Myofascial muscle pain   ? OA (osteoarthritis) of knee   ? BILATERAL  ? PCOS (polycystic ovarian syndrome)   ? PONV (postoperative nausea and vomiting)   ?  Seasonal allergic rhinitis   ? ? ?Past Surgical History:  ?Procedure Laterality Date  ? HYSTEROSCOPY WITH D & C N/A 09/07/2014  ? Procedure:  DILATATION AND CURETTAGE /HYSTEROSCOPY/POLYPECTOMY;  Surgeon: Governor Specking, MD;  Location: [facility];  Service: Gynecology;  Laterality: N/A;  ? I & D RIGHT FOOT  1998  ? LAPAROSCOPIC GASTRIC SLEEVE RESECTION N/A 10/20/2017  ? Procedure: LAPAROSCOPIC GASTRIC SLEEVE RESECTION, UPPER ENDO, ERAS Pathway;  Surgeon: Clovis Riley, MD;  Location: [facility];  Service: General;  Laterality: N/A;  ? POLYPECTOMY    ? uterine  ? TONSILLECTOMY  2001  ? [place] EXTRACTION  2004  ? ? ?MEDS:   ?Current Outpatient Medications on File Prior to Visit  ?Medication Sig Dispense Refill  ? ALPRAZolam (XANAX) 0.5 MG tablet Take 1 tablet (0.5 mg total) by mouth 3 (three) times daily as needed for anxiety. 20 tablet 0  ? celecoxib (CELEBREX) 200 MG capsule Take 200 mg by mouth daily.    ? cyclobenzaprine (FLEXERIL) 5 MG tablet TAKE 1 OR 2 EVERY 8 HOURS AS NEEDED FOR MUSCLE RELAXANT. CAUTION: MAY CAUSE DROWSINESS. 90 tablet 0  ? FLUoxetine (PROZAC) 20 MG capsule TAKE 1 CAPSULE BY MOUTH EVERY DAY 90 capsule 3  ? fluticasone (FLONASE) 50 MCG/ACT nasal spray Place 2 sprays into both nostrils daily. 16 g 0  ? loratadine (CLARITIN) 10 MG tablet Take 10 mg by mouth daily.    ? Semaglutide,0.25 or 0.5MG /DOS, (OZEMPIC, 0.25 OR 0.5 MG/DOSE,) 2 MG/1.5ML SOPN Inject 0.25 mg subq qwk  for 4 wk then inject 0.5 mg subq qwk x4 wk, then go to full dose pen qwk 3 mL 1  ? topiramate (TOPAMAX) 100 MG tablet Take 100 mg by mouth 2 (two) times daily.    ? valsartan (DIOVAN) 320 MG tablet TAKE 1 TABLET BY MOUTH DAILY. NEEDS APPT WITH PROVIDER. CALL OFFICE TO SCHEDULE. 30 tablet 3  ? zolpidem (AMBIEN) 10 MG tablet TAKE 1 TABLET BY MOUTH AT BEDTIME AS NEEDED FOR SLEEP. 10 tablet 0  ? azithromycin (ZITHROMAX) 250 MG tablet Take 1 tablet (250 mg total) by mouth daily. Take first 2 tablets together, then  1 every day until finished. (Patient not taking: Reported on 06/13/2021) 6 tablet 0  ? fluconazole (DIFLUCAN) 150 MG tablet Take 1 tablet (150 mg total) by mouth daily. Repeat in 1 week if needed (Patient not taking: Reported on 06/13/2021) 2 tablet 0  ? predniSONE (DELTASONE) 20 MG tablet Take 3 tabs PO daily x 5 days. (Patient not taking: Reported on 06/13/2021) 15 tablet 0  ? SAVELLA TITRATION PACK 12.5 & 25 & 50 MG MISC Take by mouth. (Patient not taking: Reported on 06/13/2021)    ? ?No current facility-administered medications on file prior to visit.  ? ? ?ALLERGIES: Enalapril maleate, Savella [milnacipran], Sulfa antibiotics, Amoxicillin, Erythromycin, and Penicillins ? ?Family History  ?Problem Relation Age of Onset  ? Diabetes Mother   ? Polycystic ovary syndrome Mother   ? Hypertension Father   ? Diabetes Father   ? Fibromyalgia Maternal Aunt   ? Fibromyalgia Cousin   ? Cancer Paternal Grandfather   ?     prostate  ? Glaucoma Paternal Grandmother   ? ? ?SH:  partnered, non smoker ? ?Review of Systems  ?Genitourinary:   ?     Irregular bleeding  ? ?PHYSICAL EXAMINATION:   ? ?BP 124/83 (BP Location: Left Arm, Patient Position: Sitting, Cuff Size: Large)   Pulse 81   Ht 5\' 9"  (1.753 m) Comment: Reported  Wt 269 lb 12.8 oz (122.4 kg)   BMI 39.84 kg/m?     ?General appearance: alert, cooperative and appears stated age ?Lymph:  no inguinal LAD noted ? ?Pelvic: External genitalia:  no lesions ?             Urethra:  normal appearing urethra with no masses, tenderness or lesions ?             Bartholins and Skenes: normal    ?             Vagina: normal appearing vagina with normal color and discharge, no lesions ?             Cervix: no lesions ?             Bimanual Exam:  Uterus:  normal size, contour, position, consistency, mobility, non-tender ?             Adnexa: no mass, fullness, tenderness ? ?Endometrial biopsy recommended.  Discussed with patient.  Verbal and written consent obtained.   ?Procedure:   Speculum placed.  Cervix visualized and cleansed with betadine prep.  A single toothed tenaculum was applied to the anterior lip of the cervix.  Endometrial pipelle was advanced through the cervix into the endometrial cavity without difficulty.  Pipelle passed to 7cm.  Suction applied and pipelle removed with good tissue sample obtained.  Tenculum removed.  No bleeding noted.  Patient tolerated procedure well. ? ?Chaperone, Octaviano Batty, CMA, was present for exam. ? ?  Assessment/Plan: ?1. Abnormal menstrual cycle ?- [place] Sonohysterogram ?- POCT urine pregnancy ?- Surgical pathology( [place]) ? ?2. Abnormal ultrasound of endometrium ? ? ? ?

## 2021-06-21 ENCOUNTER — Encounter (HOSPITAL_BASED_OUTPATIENT_CLINIC_OR_DEPARTMENT_OTHER): Payer: Self-pay

## 2021-06-21 NOTE — Addendum Note (Signed)
Addended by: Megan Salon on: 06/21/2021 01:29 PM ? ? Modules accepted: Level of Service ? ?

## 2021-06-27 ENCOUNTER — Encounter: Payer: Self-pay | Admitting: Sports Medicine

## 2021-06-27 ENCOUNTER — Ambulatory Visit (INDEPENDENT_AMBULATORY_CARE_PROVIDER_SITE_OTHER): Payer: BC Managed Care – PPO | Admitting: Sports Medicine

## 2021-06-27 DIAGNOSIS — E119 Type 2 diabetes mellitus without complications: Secondary | ICD-10-CM

## 2021-06-27 DIAGNOSIS — I10 Essential (primary) hypertension: Secondary | ICD-10-CM | POA: Diagnosis not present

## 2021-06-27 MED ORDER — VALSARTAN 160 MG PO TABS: 160.0000 mg | ORAL_TABLET | Freq: Every day | ORAL | 11 refills | Status: DC

## 2021-06-27 MED ORDER — SEMAGLUTIDE (1 MG/DOSE) 4 MG/3ML ~~LOC~~ SOPN: 1.0000 mg | PEN_INJECTOR | SUBCUTANEOUS | 0 refills | Status: DC

## 2021-06-27 MED ORDER — NOVOFINE PLUS PEN NEEDLE 32G X 4 MM MISC: 11 refills | Status: DC

## 2021-06-27 MED ORDER — SEMAGLUTIDE (2 MG/DOSE) 8 MG/3ML ~~LOC~~ SOPN: 2.0000 mg | PEN_INJECTOR | SUBCUTANEOUS | 11 refills | Status: DC

## 2021-06-27 NOTE — Assessment & Plan Note (Signed)
Currently tolerating Ozempic, she is currently on 0.25 mg, next week she will be going up to 0.5 mg, I will go ahead and call in the 1 and the 2 mg formulations. ?She will let me know if she needs more needles. ?

## 2021-06-27 NOTE — Progress Notes (Signed)
? ? ?  Procedures performed today:   ? ?None. ? ?Independent interpretation of notes and tests performed by another provider:  ? ?None. ? ?Brief History, Exam, Impression, and Recommendations:   ? ?Diet-controlled diabetes mellitus (HCC) ?Currently tolerating Ozempic, she is currently on 0.25 mg, next week she will be going up to 0.5 mg, I will go ahead and call in the 1 and the 2 mg formulations. ?She will let me know if she needs more needles. ? ?Essential hypertension, benign ?Blood pressure is a bit soft today, we will decrease her Diovan to 160 mg, we will likely need to continue to decrease this as she continues to lose weight post her bariatric surgery and as we taper up the dose of Ozempic. ? ?Chronic process with exacerbation and pharmacologic intervention ? ?___________________________________________ ?[name]. Benjamin Stain, M.D., ABFM., CAQSM. ?Primary Care and Sports Medicine ?[facility] ? ?Adjunct Instructor of Family Medicine  ?[employer] ?

## 2021-06-27 NOTE — Assessment & Plan Note (Signed)
Blood pressure is a bit soft today, we will decrease her Diovan to 160 mg, we will likely need to continue to decrease this as she continues to lose weight post her bariatric surgery and as we taper up the dose of Ozempic. ?

## 2021-06-28 ENCOUNTER — Other Ambulatory Visit: Payer: Self-pay | Admitting: Sports Medicine

## 2021-06-28 DIAGNOSIS — E119 Type 2 diabetes mellitus without complications: Secondary | ICD-10-CM

## 2021-07-04 ENCOUNTER — Encounter: Payer: Self-pay | Admitting: Sports Medicine

## 2021-07-04 DIAGNOSIS — I10 Essential (primary) hypertension: Secondary | ICD-10-CM

## 2021-07-06 ENCOUNTER — Other Ambulatory Visit (HOSPITAL_BASED_OUTPATIENT_CLINIC_OR_DEPARTMENT_OTHER): Payer: Self-pay | Admitting: Obstetrics & Gynecology

## 2021-07-06 DIAGNOSIS — Z01818 Encounter for other preprocedural examination: Secondary | ICD-10-CM

## 2021-07-09 ENCOUNTER — Encounter: Payer: Self-pay | Admitting: Obstetrics & Gynecology

## 2021-07-09 ENCOUNTER — Ambulatory Visit: Payer: BC Managed Care – PPO | Admitting: Obstetrics & Gynecology

## 2021-07-09 VITALS — BP 137/98 | HR 83 | Ht 69.0 in | Wt 266.0 lb

## 2021-07-09 DIAGNOSIS — S01512A Laceration without foreign body of oral cavity, initial encounter: Secondary | ICD-10-CM | POA: Diagnosis not present

## 2021-07-09 NOTE — Progress Notes (Signed)
Pt is having vaginal pain and burning after intercourse ?

## 2021-07-09 NOTE — Progress Notes (Signed)
? ?  Subjective:  ? ? Patient ID: Chelsea [name], female    DOB: [birth year], 36 y.o.   MRN: [id number] ? ?HPI ? ?Maudie presents for bleeding and pain after intercourse with her wife last night.  She suspects the mechanical vibrator caused a laceration near her clitoris.  She had bleeding last night and this morning.  She also had burning on her external genitalia near the clitoris when urinating. She does not have deep vaginal pain and does not think the bleeding is coming from the uterus.  ? ? ?Review of Systems  ?Constitutional: Negative.   ?Respiratory: Negative.    ?Cardiovascular: Negative.   ?Gastrointestinal: Negative.   ?Genitourinary:   ?     Bleeding from mucosa near clitoris   ? ?   ?Objective:  ? Physical Exam ?Vitals reviewed.  ?Constitutional:   ?   General: She is not in acute distress. ?   Appearance: She is well-developed.  ?HENT:  ?   Head: Normocephalic and atraumatic.  ?Eyes:  ?   Conjunctiva/sclera: Conjunctivae normal.  ?Cardiovascular:  ?   Rate and Rhythm: Normal rate.  ?Pulmonary:  ?   Effort: Pulmonary effort is normal.  ?Skin: ?   General: Skin is warm and dry.  ?Neurological:  ?   Mental Status: She is alert and oriented to person, place, and time.  ?Psychiatric:     ?   Mood and Affect: Mood normal.  ? ?Vitals:  ? 07/09/21 1052  ?BP: (!) 137/98  ?Pulse: 83  ?Weight: 266 lb (120.7 kg)  ?Height: 5\' 9"  (1.753 m)  ? ?   ?Assessment & Plan:  ?37 yo female with small laceration inferior to clitoris.  No active bleeding.  ? ?Clean gently with mild soap and water once a day ?Refrain from sexual activity until well healed. ?F/U with Dr. 32 for D & C ?F/U with PCP if BP does not go down--pt is admittedly anxious due to her chief complaint today.  ? ?

## 2021-07-10 ENCOUNTER — Encounter: Payer: Self-pay | Admitting: Obstetrics & Gynecology

## 2021-07-10 ENCOUNTER — Encounter: Payer: Self-pay | Admitting: Sports Medicine

## 2021-07-10 ENCOUNTER — Observation Stay (HOSPITAL_BASED_OUTPATIENT_CLINIC_OR_DEPARTMENT_OTHER)
Admission: EM | Admit: 2021-07-10 | Discharge: 2021-07-12 | Disposition: A | Discharge status: Home / Self Care | Payer: BC Managed Care – PPO | Attending: Surgery | Admitting: Surgery

## 2021-07-10 ENCOUNTER — Emergency Department (HOSPITAL_BASED_OUTPATIENT_CLINIC_OR_DEPARTMENT_OTHER): Payer: BC Managed Care – PPO

## 2021-07-10 ENCOUNTER — Encounter (HOSPITAL_BASED_OUTPATIENT_CLINIC_OR_DEPARTMENT_OTHER): Payer: Self-pay | Admitting: Emergency Medicine

## 2021-07-10 ENCOUNTER — Emergency Department
Admission: EM | Admit: 2021-07-10 | Discharge: 2021-07-10 | Disposition: A | Discharge status: Home / Self Care | Payer: BC Managed Care – PPO | Source: Home / Self Care

## 2021-07-10 ENCOUNTER — Other Ambulatory Visit: Payer: Self-pay

## 2021-07-10 DIAGNOSIS — R110 Nausea: Secondary | ICD-10-CM

## 2021-07-10 DIAGNOSIS — K8012 Calculus of gallbladder with acute and chronic cholecystitis without obstruction: Secondary | ICD-10-CM | POA: Diagnosis not present

## 2021-07-10 DIAGNOSIS — G894 Chronic pain syndrome: Secondary | ICD-10-CM | POA: Insufficient documentation

## 2021-07-10 DIAGNOSIS — K8020 Calculus of gallbladder without cholecystitis without obstruction: Secondary | ICD-10-CM | POA: Diagnosis not present

## 2021-07-10 DIAGNOSIS — K658 Other peritonitis: Secondary | ICD-10-CM | POA: Insufficient documentation

## 2021-07-10 DIAGNOSIS — E119 Type 2 diabetes mellitus without complications: Secondary | ICD-10-CM | POA: Diagnosis not present

## 2021-07-10 DIAGNOSIS — K810 Acute cholecystitis: Secondary | ICD-10-CM | POA: Diagnosis not present

## 2021-07-10 DIAGNOSIS — R109 Unspecified abdominal pain: Secondary | ICD-10-CM | POA: Diagnosis not present

## 2021-07-10 DIAGNOSIS — I10 Essential (primary) hypertension: Secondary | ICD-10-CM | POA: Diagnosis not present

## 2021-07-10 DIAGNOSIS — K8000 Calculus of gallbladder with acute cholecystitis without obstruction: Secondary | ICD-10-CM | POA: Diagnosis present

## 2021-07-10 DIAGNOSIS — R1031 Right lower quadrant pain: Secondary | ICD-10-CM

## 2021-07-10 DIAGNOSIS — R1011 Right upper quadrant pain: Secondary | ICD-10-CM | POA: Diagnosis not present

## 2021-07-10 LAB — URINALYSIS, MICROSCOPIC (REFLEX)

## 2021-07-10 LAB — URINALYSIS, ROUTINE W REFLEX MICROSCOPIC
Glucose, UA: NEGATIVE mg/dL
Ketones, ur: 40 mg/dL — AB
Leukocytes,Ua: NEGATIVE
Nitrite: NEGATIVE
Protein, ur: 30 mg/dL — AB
Specific Gravity, Urine: >=1.03 (ref 1.005–1.030)
pH: 5.5 (ref 5.0–8.0)

## 2021-07-10 LAB — CBC WITH DIFFERENTIAL/PLATELET
Abs Immature Granulocytes: 0.07 10*3/uL (ref 0.00–0.07)
Basophils Absolute: 0.1 10*3/uL (ref 0.0–0.1)
Basophils Relative: 0 %
Eosinophils Absolute: 0 10*3/uL (ref 0.0–0.5)
Eosinophils Relative: 0 %
HCT: 42 % (ref 36.0–46.0)
Hemoglobin: 14.6 g/dL (ref 12.0–15.0)
Immature Granulocytes: 0 %
Lymphocytes Relative: 11 %
Lymphs Abs: 1.8 10*3/uL (ref 0.7–4.0)
MCH: 29.4 pg (ref 26.0–34.0)
MCHC: 34.8 g/dL (ref 30.0–36.0)
MCV: 84.7 fL (ref 80.0–100.0)
Monocytes Absolute: 0.3 10*3/uL (ref 0.1–1.0)
Monocytes Relative: 2 %
Neutro Abs: 14.4 10*3/uL — ABNORMAL HIGH (ref 1.7–7.7)
Neutrophils Relative %: 87 %
Platelets: 238 10*3/uL (ref 150–400)
RBC: 4.96 MIL/uL (ref 3.87–5.11)
RDW: 13.2 % (ref 11.5–15.5)
WBC: 16.7 10*3/uL — ABNORMAL HIGH (ref 4.0–10.5)
nRBC: 0 % (ref 0.0–0.2)

## 2021-07-10 LAB — COMPREHENSIVE METABOLIC PANEL
ALT: 49 U/L — ABNORMAL HIGH (ref 0–44)
AST: 23 U/L (ref 15–41)
Albumin: 4.1 g/dL (ref 3.5–5.0)
Alkaline Phosphatase: 108 U/L (ref 38–126)
Anion gap: 7 (ref 5–15)
BUN: 12 mg/dL (ref 6–20)
CO2: 21 mmol/L — ABNORMAL LOW (ref 22–32)
Calcium: 8.9 mg/dL (ref 8.9–10.3)
Chloride: 112 mmol/L — ABNORMAL HIGH (ref 98–111)
Creatinine, Ser: 0.88 mg/dL (ref 0.44–1.00)
GFR, Estimated: >60 mL/min (ref >60)
Glucose, Bld: 128 mg/dL — ABNORMAL HIGH (ref 70–99)
Potassium: 3.5 mmol/L (ref 3.5–5.1)
Sodium: 140 mmol/L (ref 135–145)
Total Bilirubin: 0.8 mg/dL (ref 0.3–1.2)
Total Protein: 7.6 g/dL (ref 6.5–8.1)

## 2021-07-10 LAB — PREGNANCY, URINE: Preg Test, Ur: NEGATIVE

## 2021-07-10 LAB — LIPASE, BLOOD: Lipase: 42 U/L (ref 11–51)

## 2021-07-10 MED ORDER — METOCLOPRAMIDE HCL 5 MG/ML IJ SOLN
10.0000 mg | Freq: Once | INTRAMUSCULAR | Status: AC
  Administered 2021-07-10: 10 mg via INTRAVENOUS
  Filled 2021-07-10: qty 2

## 2021-07-10 MED ORDER — SODIUM CHLORIDE 0.9 % IV SOLN
1.0000 g | Freq: Once | INTRAVENOUS | Status: AC
Start: 2021-07-10 — End: 2021-07-10
  Administered 2021-07-10: 1 g via INTRAVENOUS
  Filled 2021-07-10: qty 10

## 2021-07-10 MED ORDER — DIPHENHYDRAMINE HCL 25 MG PO CAPS
25.0000 mg | ORAL_CAPSULE | Freq: Once | ORAL | Status: AC
  Administered 2021-07-10: 25 mg via ORAL
  Filled 2021-07-10: qty 1

## 2021-07-10 MED ORDER — SODIUM CHLORIDE 0.9 % IV SOLN
1.0000 g | Freq: Once | INTRAVENOUS | Status: AC
  Administered 2021-07-11: 1 g via INTRAVENOUS
  Filled 2021-07-10: qty 10

## 2021-07-10 MED ORDER — MORPHINE SULFATE (PF) 4 MG/ML IV SOLN
4.0000 mg | Freq: Once | INTRAVENOUS | Status: AC
  Administered 2021-07-10: 4 mg via INTRAVENOUS
  Filled 2021-07-10: qty 1

## 2021-07-10 MED ORDER — SODIUM CHLORIDE 0.9 % IV BOLUS
1000.0000 mL | Freq: Once | INTRAVENOUS | Status: AC
  Administered 2021-07-10: 1000 mL via INTRAVENOUS

## 2021-07-10 MED ORDER — IOHEXOL 300 MG/ML  SOLN
125.0000 mL | Freq: Once | INTRAMUSCULAR | Status: AC | PRN
  Administered 2021-07-10: 125 mL via INTRAVENOUS

## 2021-07-10 MED ORDER — HYDROMORPHONE HCL 1 MG/ML IJ SOLN
0.5000 mg | Freq: Once | INTRAMUSCULAR | Status: AC
  Administered 2021-07-10: 0.5 mg via INTRAVENOUS
  Filled 2021-07-10: qty 1

## 2021-07-10 MED ORDER — ONDANSETRON HCL 4 MG/2ML IJ SOLN
8.0000 mg | Freq: Once | INTRAMUSCULAR | Status: AC
  Administered 2021-07-10: 8 mg via INTRAMUSCULAR

## 2021-07-10 NOTE — ED Notes (Signed)
Pt moderate distress in bed, squirming around in bed. A/ox4, c/o 10/10 RUQ pain that exacerbates at times, happened once last week and went away but today has been constant since around 1600. +n/v. -GU symptoms ?

## 2021-07-10 NOTE — ED Triage Notes (Signed)
Pt states that she has some abdominal pain on her right side mostly. Pt states that she also has some vomiting. X1 day. ?

## 2021-07-10 NOTE — Discharge Instructions (Addendum)
Instructed patient to go to [facility] ED NOW for further evaluation of right lower/right upper quadrant abdominal pain.  ?

## 2021-07-10 NOTE — ED Triage Notes (Addendum)
Sharp RUQ abdominal pain, vaginal bleeding, and n/v since 1600 today. Sent from UC. Receive zofran IM at UC. No vomiting since. Hx of pcos.  ?

## 2021-07-10 NOTE — ED Notes (Signed)
Pt up to restroom.

## 2021-07-10 NOTE — ED Notes (Signed)
Patient is being discharged from the Urgent Care and sent to the Emergency Department via POV . Per Jennings Books NP, patient is in need of higher level of care due to RLQ Abd pain. Patient is aware and verbalizes understanding of plan of care.  ?Vitals:  ? 07/10/21 1805  ?BP: (!) 142/96  ?Pulse: (!) 50  ?Resp: 18  ?Temp: 97.9 ?F (36.6 ?C)  ?SpO2: 97%  ?  ?

## 2021-07-10 NOTE — ED Provider Notes (Signed)
?[facility] ? ? ? ?CSN: [id number] ?Arrival date & time: 07/10/21  1749 ? ? ?  ? ?History   ?Chief Complaint ?Chief Complaint  ?Patient presents with  ? Abdominal Pain  ?  Abdominal pain. Right side. Vomiting as well. X1 day  ? ? ?HPI ?Chelsea [name] is a 36 y.o. female.  ? ?HPI 36 year old female presents with a right quadrant abdominal pain and vomiting since earlier this morning.  PMH significant for morbid obesity, fibromyalgia, PCOS, chronic pain syndrome. ? ?Past Medical History:  ?Diagnosis Date  ? Depression   ? Endometrial polyp   ? Fibromyalgia   ? History of recurrent UTIs   ? Hypertension   ? Infertility, anovulation   ? Myofascial muscle pain   ? OA (osteoarthritis) of knee   ? BILATERAL  ? PCOS (polycystic ovarian syndrome)   ? PONV (postoperative nausea and vomiting)   ? Seasonal allergic rhinitis   ? ? ?Patient Active Problem List  ? Diagnosis Date Noted  ? Right ankle sprain 07/19/2020  ? Chronic pain syndrome 05/30/2020  ? Impingement syndrome of both shoulders 08/17/2019  ? Primary insomnia 05/11/2019  ? Paronychia of second toe, left 01/06/2019  ? Mixed hypercholesterolemia and hypertriglyceridemia 11/13/2018  ? Orgasmic headache 11/11/2018  ? Diet-controlled diabetes mellitus (HCC) 04/25/2017  ? Low back pain 08/24/2015  ? Intractable migraine without aura and with status migrainosus 06/24/2014  ? Myofascial pain syndrome with depression 02/08/2014  ? Venous stasis dermatitis 01/25/2014  ? Right hip pain 12/13/2013  ? Metatarsalgia of both feet 10/18/2013  ? Right knee pain 02/13/2012  ? Essential hypertension, benign 11/25/2008  ? POLYCYSTIC OVARIAN DISEASE 10/26/2008  ? History of gastric sleeve 10/26/2008  ? ? ?Past Surgical History:  ?Procedure Laterality Date  ? HYSTEROSCOPY WITH D & C N/A 09/07/2014  ? Procedure:  DILATATION AND CURETTAGE /HYSTEROSCOPY/POLYPECTOMY;  Surgeon: Fermin Schwab, MD;  Location: [facility];  Service: Gynecology;  Laterality: N/A;  ?  I & D RIGHT FOOT  1998  ? LAPAROSCOPIC GASTRIC SLEEVE RESECTION N/A 10/20/2017  ? Procedure: LAPAROSCOPIC GASTRIC SLEEVE RESECTION, UPPER ENDO, ERAS Pathway;  Surgeon: Berna Bue, MD;  Location: [facility];  Service: General;  Laterality: N/A;  ? POLYPECTOMY    ? uterine  ? TONSILLECTOMY  2001  ? WISDOM TOOTH EXTRACTION  2004  ? ? ?OB History   ? ? Gravida  ?0  ? Para  ?0  ? Term  ?0  ? Preterm  ?0  ? AB  ?0  ? Living  ?0  ?  ? ? SAB  ?0  ? IAB  ?0  ? Ectopic  ?0  ? Multiple  ?0  ? Live Births  ?   ?   ?  ?  ? ? ? ?Home Medications   ? ?Prior to Admission medications   ?Medication Sig Start Date End Date Taking? Authorizing Provider  ?ALPRAZolam (XANAX) 0.5 MG tablet Take 1 tablet (0.5 mg total) by mouth 3 (three) times daily as needed for anxiety. 12/09/18  Yes Monica Becton, MD  ?celecoxib (CELEBREX) 200 MG capsule Take 200 mg by mouth daily. 06/30/20  Yes [provider]  ?cyclobenzaprine (FLEXERIL) 5 MG tablet TAKE 1 OR 2 EVERY 8 HOURS AS NEEDED FOR MUSCLE RELAXANT. CAUTION: MAY CAUSE DROWSINESS. 09/09/19  Yes Monica Becton, MD  ?FLUoxetine (PROZAC) 20 MG capsule TAKE 1 CAPSULE BY MOUTH EVERY DAY 10/27/20  Yes Monica Becton, MD  ?loratadine (CLARITIN)  10 MG tablet Take 10 mg by mouth daily.   Yes [provider]  ?NOVOFINE PLUS PEN NEEDLE 32G X 4 MM MISC USE WEEKLY FOR OZEMPIC INJECTIONS 06/28/21  Yes Monica Becton, MD  ?Semaglutide, 1 MG/DOSE, 4 MG/3ML SOPN Inject 1 mg as directed once a week. For 1 month and then increase to the next dose 06/27/21  Yes Thekkekandam, [name], MD  ?Semaglutide, 2 MG/DOSE, 8 MG/3ML SOPN Inject 2 mg as directed once a week. 06/27/21  Yes Monica Becton, MD  ?topiramate (TOPAMAX) 100 MG tablet Take 100 mg by mouth 2 (two) times daily. 02/08/21  Yes [provider]  ?valsartan (DIOVAN) 160 MG tablet Take 0.5 tablets (80 mg total) by mouth daily. 07/06/21  Yes Monica Becton, MD  ?zolpidem (AMBIEN) 10 MG tablet  TAKE 1 TABLET BY MOUTH AT BEDTIME AS NEEDED FOR SLEEP. 06/28/19  Yes Monica Becton, MD  ? ? ?Family History ?Family History  ?Problem Relation Age of Onset  ? Diabetes Mother   ? Polycystic ovary syndrome Mother   ? Hypertension Father   ? Diabetes Father   ? Fibromyalgia Maternal Aunt   ? Fibromyalgia Cousin   ? Cancer Paternal Grandfather   ?     prostate  ? Glaucoma Paternal Grandmother   ? ? ?Social History ?Social History  ? ?Tobacco Use  ? Smoking status: Never  ? Smokeless tobacco: Never  ?Vaping Use  ? Vaping Use: Never used  ?Substance Use Topics  ? Alcohol use: No  ?  Alcohol/week: 0.0 standard drinks  ? Drug use: No  ? ? ? ?Allergies   ?Enalapril maleate, Savella [milnacipran], Sulfa antibiotics, Amoxicillin, Erythromycin, and Penicillins ? ? ?Review of Systems ?Review of Systems  ?Gastrointestinal:  Positive for abdominal pain.  ?All other systems reviewed and are negative. ? ? ?Physical Exam ?Triage Vital Signs ?ED Triage Vitals  ?Enc Vitals Group  ?   BP 07/10/21 1805 (!) 142/96  ?   Pulse Rate 07/10/21 1805 (!) 50  ?   Resp 07/10/21 1805 18  ?   Temp 07/10/21 1805 97.9 ?F (36.6 ?C)  ?   Temp src --   ?   SpO2 07/10/21 1805 97 %  ?   Weight 07/10/21 1802 266 lb (120.7 kg)  ?   Height 07/10/21 1802 5\' 9"  (1.753 m)  ?   Head Circumference --   ?   Peak Flow --   ?   Pain Score 07/10/21 1802 10  ?   Pain Loc --   ?   Pain Edu? --   ?   Excl. in GC? --   ? ?No data found. ? ?Updated Vital Signs ?BP (!) 142/96 (BP Location: Left Arm)   Pulse (!) 50   Temp 97.9 ?F (36.6 ?C)   Resp 18   Ht 5\' 9"  (1.753 m)   Wt 266 lb (120.7 kg)   SpO2 97%   BMI 39.28 kg/m?  ? ? ? ?Physical Exam ?Vitals and nursing note reviewed.  ?Constitutional:   ?   Appearance: She is obese. She is ill-appearing.  ?HENT:  ?   Head: Normocephalic and atraumatic.  ?   Mouth/Throat:  ?   Mouth: Mucous membranes are moist.  ?   Pharynx: Oropharynx is clear.  ?Eyes:  ?   Extraocular Movements: Extraocular movements intact.  ?    Pupils: Pupils are equal, round, and reactive to light.  ?Cardiovascular:  ?   Rate  and Rhythm: Normal rate and regular rhythm.  ?   Heart sounds: Normal heart sounds. No murmur heard. ?Pulmonary:  ?   Effort: Pulmonary effort is normal.  ?   Breath sounds: Normal breath sounds. No wheezing, rhonchi or rales.  ?Abdominal:  ?   General: Bowel sounds are absent. There is no distension.  ?   Palpations: Abdomen is soft. There is no hepatomegaly, splenomegaly, mass or pulsatile mass.  ?   Tenderness: There is abdominal tenderness in the right upper quadrant and right lower quadrant. There is no right CVA tenderness, guarding or rebound. Negative signs include Murphy's sign.  ?   Hernia: No hernia is present.  ?   Comments: Obese abdomen  ?Skin: ?   General: Skin is warm and dry.  ?Neurological:  ?   General: No focal deficit present.  ?   Mental Status: She is alert and oriented to person, place, and time.  ? ? ? ?UC Treatments / Results  ?Labs ?(all labs ordered are listed, but only abnormal results are displayed) ?Labs Reviewed - No data to display ? ?EKG ? ? ?Radiology ?No results found. ? ?Procedures ?Procedures (including critical care time) ? ?Medications Ordered in UC ?Medications  ?ondansetron (ZOFRAN) injection 8 mg (8 mg Intramuscular Given 07/10/21 1824)  ? ? ?Initial Impression / Assessment and Plan / UC Course  ?I have reviewed the triage vital signs and the nursing notes. ? ?Pertinent labs & imaging results that were available during my care of the patient were reviewed by me and considered in my medical decision making (see chart for details). ? ?  ? ?MDM: 1.  Right lower abdominal pain-; 2. Nausea-IM Zofran 8 mg given once in clinic. [facility] for further evaluation of right lower/right upper quadrant abdominal pain.  Patient agreed and verbalized understanding of these instructions and this plan of care this evening.  Patient discharged,  hemodynamically stable. ?Final Clinical Impressions(s) / UC Diagnoses  ? ?Final diagnoses:  ?RLQ abdominal pain  ?Nausea  ? ? ? ?Discharge Instructions   ? ?  ?[facility]

## 2021-07-10 NOTE — ED Notes (Signed)
US at bedside

## 2021-07-10 NOTE — ED Provider Notes (Signed)
?[facility] EMERGENCY DEPARTMENT ?Provider Note ? ? ?CSN: [id number] ?Arrival date & time: 07/10/21  1848 ? ?  ? ?History ? ?Chief Complaint  ?Patient presents with  ? Abdominal Pain  ? ? ?Chelsea [name] is a 36 y.o. female. ? ? ?Abdominal Pain ? ?Patient is a 36 year old female with past medical history significant for PCOS, HTN, DM 2, high cholesterol, chronic pain ?Past surgical history significant for gastric sleeve 2019 ? ?She is presented to the emergency room today with pain that began around 4 PM seems like it occurred right after she ate.  She states it was relatively rapid in onset and was located on the right upper abdomen.  She endorses nausea and several episodes of nonbloody nonbilious emesis.  She states she had similar symptoms a week ago but they were not severe and they resolved.  She states this time her symptoms have gradually worsened.  She states she had no changes in her bowel movements. ? ?Last p.o. intake was 4 PM ? ?  ? ?Home Medications ?Prior to Admission medications   ?Medication Sig Start Date End Date Taking? Authorizing Provider  ?ALPRAZolam (XANAX) 0.5 MG tablet Take 1 tablet (0.5 mg total) by mouth 3 (three) times daily as needed for anxiety. 12/09/18   Monica Becton, MD  ?celecoxib (CELEBREX) 200 MG capsule Take 200 mg by mouth daily. 06/30/20   [provider]  ?cyclobenzaprine (FLEXERIL) 5 MG tablet TAKE 1 OR 2 EVERY 8 HOURS AS NEEDED FOR MUSCLE RELAXANT. CAUTION: MAY CAUSE DROWSINESS. 09/09/19   Monica Becton, MD  ?FLUoxetine (PROZAC) 20 MG capsule TAKE 1 CAPSULE BY MOUTH EVERY DAY 10/27/20   Monica Becton, MD  ?loratadine (CLARITIN) 10 MG tablet Take 10 mg by mouth daily.    [provider]  ?NOVOFINE PLUS PEN NEEDLE 32G X 4 MM MISC USE WEEKLY FOR OZEMPIC INJECTIONS 06/28/21   Monica Becton, MD  ?Semaglutide, 1 MG/DOSE, 4 MG/3ML SOPN Inject 1 mg as directed once a week. For 1 month and then increase to the next dose  06/27/21   Monica Becton, MD  ?Semaglutide, 2 MG/DOSE, 8 MG/3ML SOPN Inject 2 mg as directed once a week. 06/27/21   Monica Becton, MD  ?topiramate (TOPAMAX) 100 MG tablet Take 100 mg by mouth 2 (two) times daily. 02/08/21   [provider]  ?valsartan (DIOVAN) 160 MG tablet Take 0.5 tablets (80 mg total) by mouth daily. 07/06/21   Monica Becton, MD  ?zolpidem (AMBIEN) 10 MG tablet TAKE 1 TABLET BY MOUTH AT BEDTIME AS NEEDED FOR SLEEP. 06/28/19   Monica Becton, MD  ?   ? ?Allergies    ?Enalapril maleate, Savella [milnacipran], Sulfa antibiotics, Amoxicillin, Erythromycin, and Penicillins   ? ?Review of Systems   ?Review of Systems  ?Gastrointestinal:  Positive for abdominal pain.  ? ?Physical Exam ?Updated Vital Signs ?BP 122/77   Pulse 62   Temp 98 ?F (36.7 ?C)   Resp 16   Wt 120.7 kg   SpO2 100%   BMI 39.28 kg/m?  ?Physical Exam ?Vitals and nursing note reviewed.  ?Constitutional:   ?   General: She is not in acute distress. ?   Appearance: She is obese.  ?   Comments: Pleasant morbidly obese 36 year old female appears quite uncomfortable.  ?HENT:  ?   Head: Normocephalic and atraumatic.  ?   Nose: Nose normal.  ?Eyes:  ?   General: No scleral icterus. ?Cardiovascular:  ?  Rate and Rhythm: Normal rate and regular rhythm.  ?   Pulses: Normal pulses.  ?   Heart sounds: Normal heart sounds.  ?Pulmonary:  ?   Effort: Pulmonary effort is normal. No respiratory distress.  ?   Breath sounds: No wheezing.  ?Abdominal:  ?   Palpations: Abdomen is soft.  ?   Tenderness: There is abdominal tenderness in the right upper quadrant and epigastric area.  ? ? ?Musculoskeletal:  ?   Cervical back: Normal range of motion.  ?   Right lower leg: No edema.  ?   Left lower leg: No edema.  ?Skin: ?   General: Skin is warm and dry.  ?   Capillary Refill: Capillary refill takes less than 2 seconds.  ?Neurological:  ?   Mental Status: She is alert. Mental status is at baseline.  ?Psychiatric:      ?   Mood and Affect: Mood normal.     ?   Behavior: Behavior normal.  ? ? ?ED Results / Procedures / Treatments   ?Labs ?(all labs ordered are listed, but only abnormal results are displayed) ?Labs Reviewed  ?COMPREHENSIVE METABOLIC PANEL - Abnormal; Notable for the following components:  ?    Result Value  ? Chloride 112 (*)   ? CO2 21 (*)   ? Glucose, Bld 128 (*)   ? ALT 49 (*)   ? All other components within normal limits  ?URINALYSIS, ROUTINE W REFLEX MICROSCOPIC - Abnormal; Notable for the following components:  ? APPearance CLOUDY (*)   ? Hgb urine dipstick LARGE (*)   ? Bilirubin Urine SMALL (*)   ? Ketones, ur 40 (*)   ? Protein, ur 30 (*)   ? All other components within normal limits  ?CBC WITH DIFFERENTIAL/PLATELET - Abnormal; Notable for the following components:  ? WBC 16.7 (*)   ? Neutro Abs 14.4 (*)   ? All other components within normal limits  ?URINALYSIS, MICROSCOPIC (REFLEX) - Abnormal; Notable for the following components:  ? Bacteria, UA MANY (*)   ? All other components within normal limits  ?URINE CULTURE  ?LIPASE, BLOOD  ?PREGNANCY, URINE  ? ? ?EKG ?None ? ?Radiology ?CT ABDOMEN PELVIS W CONTRAST ? ?Result Date: 07/10/2021 ?CLINICAL DATA:  Right abdominal pain, nausea/vomiting, vaginal bleeding EXAM: CT ABDOMEN AND PELVIS WITH CONTRAST TECHNIQUE: Multidetector CT imaging of the abdomen and pelvis was performed using the standard protocol following bolus administration of intravenous contrast. RADIATION DOSE REDUCTION: This exam was performed according to the departmental dose-optimization program which includes automated exposure control, adjustment of the mA and/or kV according to patient size and/or use of iterative reconstruction technique. CONTRAST:  125mL OMNIPAQUE IOHEXOL 300 MG/ML  SOLN COMPARISON:  None Available. FINDINGS: Lower chest: Lung bases are clear. Hepatobiliary: Liver is within normal limits. Layering tiny gallstones (series 2/image 33), without associated inflammatory  changes. No intrahepatic or extrahepatic ductal dilatation. Pancreas: Within normal limits. Spleen: Within normal limits. Adrenals/Urinary Tract: Adrenal glands are within normal limits. Kidneys are within normal limits.  No hydronephrosis. Bladder is underdistended but unremarkable. Stomach/Bowel: Stomach is notable for postsurgical changes related to gastric sleeve. No evidence of bowel obstruction. Normal appendix (coronal image 50). No colonic wall thickening or inflammatory changes. Left colon is decompressed. Vascular/Lymphatic: No evidence of abdominal aortic aneurysm. Small upper abdominal lymph nodes, including a 13 mm short axis node in the porta hepatis (series 2/image 17) and a 13 mm short axis portacaval node (series 2/image 23). Reproductive: Uterus is  within normal limits. Bilateral ovaries are within normal limits. Other: Trace right pelvic fluid, likely physiologic. Tiny fat containing right inguinal hernia (series 2/image 81). Musculoskeletal: Visualized osseous structures are within normal limits. IMPRESSION: No evidence of bowel obstruction.  Normal appendix. Cholelithiasis, without associated inflammatory changes. Small upper abdominal lymph nodes, likely reactive. Electronically Signed   By: Charline Bills M.D.   On: 07/10/2021 22:10   ? ?Procedures ?Procedures  ? ? ?Medications Ordered in ED ?Medications  ?cefTRIAXone (ROCEPHIN) 1 g in sodium chloride 0.9 % 100 mL IVPB (has no administration in time range)  ?sodium chloride 0.9 % bolus 1,000 mL (0 mLs Intravenous Stopped 07/10/21 2322)  ?metoCLOPramide (REGLAN) injection 10 mg (10 mg Intravenous Given 07/10/21 2142)  ?diphenhydrAMINE (BENADRYL) capsule 25 mg (25 mg Oral Given 07/10/21 2142)  ?morphine (PF) 4 MG/ML injection 4 mg (4 mg Intravenous Given 07/10/21 2140)  ?cefTRIAXone (ROCEPHIN) 1 g in sodium chloride 0.9 % 100 mL IVPB (0 g Intravenous Stopped 07/10/21 2323)  ?iohexol (OMNIPAQUE) 300 MG/ML solution 125 mL (125 mLs Intravenous  Contrast Given 07/10/21 2155)  ?HYDROmorphone (DILAUDID) injection 0.5 mg (0.5 mg Intravenous Given 07/10/21 2321)  ? ? ?ED Course/ Medical Decision Making/ A&P ?Clinical Course as of 07/10/21 2332  ?Tue Jul 10, 2021  ?213

## 2021-07-10 NOTE — ED Notes (Signed)
Patient transported to CT 

## 2021-07-11 ENCOUNTER — Encounter (HOSPITAL_COMMUNITY): Payer: Self-pay

## 2021-07-11 ENCOUNTER — Observation Stay (HOSPITAL_COMMUNITY): Payer: BC Managed Care – PPO | Admitting: Anesthesiology

## 2021-07-11 ENCOUNTER — Observation Stay (HOSPITAL_COMMUNITY): Payer: BC Managed Care – PPO

## 2021-07-11 ENCOUNTER — Encounter (HOSPITAL_COMMUNITY)
Admission: EM | Disposition: A | Discharge status: Home / Self Care | Payer: Self-pay | Source: Home / Self Care | Attending: Emergency Medicine

## 2021-07-11 DIAGNOSIS — M1990 Unspecified osteoarthritis, unspecified site: Secondary | ICD-10-CM | POA: Diagnosis not present

## 2021-07-11 DIAGNOSIS — E119 Type 2 diabetes mellitus without complications: Secondary | ICD-10-CM | POA: Diagnosis not present

## 2021-07-11 DIAGNOSIS — R1011 Right upper quadrant pain: Secondary | ICD-10-CM | POA: Diagnosis not present

## 2021-07-11 DIAGNOSIS — K810 Acute cholecystitis: Secondary | ICD-10-CM | POA: Diagnosis not present

## 2021-07-11 DIAGNOSIS — I10 Essential (primary) hypertension: Secondary | ICD-10-CM | POA: Diagnosis not present

## 2021-07-11 DIAGNOSIS — K8010 Calculus of gallbladder with chronic cholecystitis without obstruction: Secondary | ICD-10-CM | POA: Diagnosis not present

## 2021-07-11 DIAGNOSIS — K828 Other specified diseases of gallbladder: Secondary | ICD-10-CM | POA: Diagnosis not present

## 2021-07-11 DIAGNOSIS — K8000 Calculus of gallbladder with acute cholecystitis without obstruction: Secondary | ICD-10-CM | POA: Diagnosis present

## 2021-07-11 DIAGNOSIS — K658 Other peritonitis: Secondary | ICD-10-CM | POA: Diagnosis not present

## 2021-07-11 DIAGNOSIS — G894 Chronic pain syndrome: Secondary | ICD-10-CM | POA: Diagnosis not present

## 2021-07-11 DIAGNOSIS — K8012 Calculus of gallbladder with acute and chronic cholecystitis without obstruction: Secondary | ICD-10-CM | POA: Diagnosis not present

## 2021-07-11 HISTORY — PX: CHOLECYSTECTOMY: SHX55

## 2021-07-11 LAB — COMPREHENSIVE METABOLIC PANEL
ALT: 189 U/L — ABNORMAL HIGH (ref 0–44)
AST: 223 U/L — ABNORMAL HIGH (ref 15–41)
Albumin: 3.1 g/dL — ABNORMAL LOW (ref 3.5–5.0)
Alkaline Phosphatase: 124 U/L (ref 38–126)
Anion gap: 7 (ref 5–15)
BUN: 9 mg/dL (ref 6–20)
CO2: 19 mmol/L — ABNORMAL LOW (ref 22–32)
Calcium: 7 mg/dL — ABNORMAL LOW (ref 8.9–10.3)
Chloride: 118 mmol/L — ABNORMAL HIGH (ref 98–111)
Creatinine, Ser: 0.69 mg/dL (ref 0.44–1.00)
GFR, Estimated: >60 mL/min (ref >60)
Glucose, Bld: 144 mg/dL — ABNORMAL HIGH (ref 70–99)
Potassium: 2.8 mmol/L — ABNORMAL LOW (ref 3.5–5.1)
Sodium: 144 mmol/L (ref 135–145)
Total Bilirubin: 1.6 mg/dL — ABNORMAL HIGH (ref 0.3–1.2)
Total Protein: 6 g/dL — ABNORMAL LOW (ref 6.5–8.1)

## 2021-07-11 LAB — CBC
HCT: 41.5 % (ref 36.0–46.0)
Hemoglobin: 14.3 g/dL (ref 12.0–15.0)
MCH: 29.8 pg (ref 26.0–34.0)
MCHC: 34.5 g/dL (ref 30.0–36.0)
MCV: 86.5 fL (ref 80.0–100.0)
Platelets: 226 10*3/uL (ref 150–400)
RBC: 4.8 MIL/uL (ref 3.87–5.11)
RDW: 13.7 % (ref 11.5–15.5)
WBC: 15.5 10*3/uL — ABNORMAL HIGH (ref 4.0–10.5)
nRBC: 0 % (ref 0.0–0.2)

## 2021-07-11 LAB — HIV ANTIBODY (ROUTINE TESTING W REFLEX): HIV Screen 4th Generation wRfx: NONREACTIVE

## 2021-07-11 SURGERY — LAPAROSCOPIC CHOLECYSTECTOMY WITH INTRAOPERATIVE CHOLANGIOGRAM
Anesthesia: General

## 2021-07-11 MED ORDER — PROPOFOL 10 MG/ML IV BOLUS
INTRAVENOUS | Status: DC | PRN
  Administered 2021-07-11: 160 mg via INTRAVENOUS

## 2021-07-11 MED ORDER — LIP MEDEX EX OINT
TOPICAL_OINTMENT | CUTANEOUS | Status: DC | PRN
  Filled 2021-07-11: qty 7

## 2021-07-11 MED ORDER — SODIUM CHLORIDE 0.9 % IV SOLN
2.0000 g | INTRAVENOUS | Status: DC
  Administered 2021-07-12: 2 g via INTRAVENOUS
  Filled 2021-07-11: qty 20

## 2021-07-11 MED ORDER — BUPIVACAINE-EPINEPHRINE 0.25% -1:200000 IJ SOLN
INTRAMUSCULAR | Status: DC | PRN
  Administered 2021-07-11: 30 mL

## 2021-07-11 MED ORDER — TOPIRAMATE 100 MG PO TABS
100.0000 mg | ORAL_TABLET | Freq: Two times a day (BID) | ORAL | Status: DC
  Administered 2021-07-11 – 2021-07-12 (×2): 100 mg via ORAL
  Filled 2021-07-11 (×2): qty 1

## 2021-07-11 MED ORDER — LIDOCAINE HCL (PF) 2 % IJ SOLN
INTRAMUSCULAR | Status: AC
  Filled 2021-07-11: qty 5

## 2021-07-11 MED ORDER — HYDROMORPHONE HCL 1 MG/ML IJ SOLN
0.5000 mg | Freq: Once | INTRAMUSCULAR | Status: AC
Start: 2021-07-11 — End: 2021-07-11
  Administered 2021-07-11: 0.5 mg via INTRAVENOUS
  Filled 2021-07-11: qty 1

## 2021-07-11 MED ORDER — HYDROMORPHONE HCL 1 MG/ML IJ SOLN
0.5000 mg | Freq: Once | INTRAMUSCULAR | Status: AC
  Administered 2021-07-11: 0.5 mg via INTRAVENOUS
  Filled 2021-07-11: qty 1

## 2021-07-11 MED ORDER — KETOROLAC TROMETHAMINE 30 MG/ML IJ SOLN
INTRAMUSCULAR | Status: AC
  Filled 2021-07-11: qty 1

## 2021-07-11 MED ORDER — DEXMEDETOMIDINE (PRECEDEX) IN NS 20 MCG/5ML (4 MCG/ML) IV SYRINGE
PREFILLED_SYRINGE | INTRAVENOUS | Status: DC | PRN
  Administered 2021-07-11: 8 ug via INTRAVENOUS
  Administered 2021-07-11 (×3): 4 ug via INTRAVENOUS

## 2021-07-11 MED ORDER — MIDAZOLAM HCL 5 MG/5ML IJ SOLN
INTRAMUSCULAR | Status: DC | PRN
  Administered 2021-07-11: 2 mg via INTRAVENOUS

## 2021-07-11 MED ORDER — ROCURONIUM BROMIDE 10 MG/ML (PF) SYRINGE
PREFILLED_SYRINGE | INTRAVENOUS | Status: DC | PRN
  Administered 2021-07-11: 80 mg via INTRAVENOUS

## 2021-07-11 MED ORDER — PROPOFOL 10 MG/ML IV BOLUS
INTRAVENOUS | Status: AC
Start: 2021-07-11 — End: ?
  Filled 2021-07-11: qty 20

## 2021-07-11 MED ORDER — ONDANSETRON 4 MG PO TBDP: 4.0000 mg | ORAL_TABLET | Freq: Four times a day (QID) | ORAL | Status: DC | PRN

## 2021-07-11 MED ORDER — HYDROMORPHONE HCL 1 MG/ML IJ SOLN: 0.2500 mg | INTRAMUSCULAR | Status: DC | PRN

## 2021-07-11 MED ORDER — ROCURONIUM BROMIDE 10 MG/ML (PF) SYRINGE
PREFILLED_SYRINGE | INTRAVENOUS | Status: AC
  Filled 2021-07-11: qty 10

## 2021-07-11 MED ORDER — LACTATED RINGERS IV SOLN: INTRAVENOUS | Status: DC

## 2021-07-11 MED ORDER — OXYCODONE HCL 5 MG PO TABS: 5.0000 mg | ORAL_TABLET | ORAL | Status: DC | PRN

## 2021-07-11 MED ORDER — KETAMINE HCL 50 MG/5ML IJ SOSY
PREFILLED_SYRINGE | INTRAMUSCULAR | Status: AC
  Filled 2021-07-11: qty 5

## 2021-07-11 MED ORDER — ONDANSETRON HCL 4 MG/2ML IJ SOLN
INTRAMUSCULAR | Status: DC | PRN
  Administered 2021-07-11: 4 mg via INTRAVENOUS

## 2021-07-11 MED ORDER — ONDANSETRON HCL 4 MG/2ML IJ SOLN
4.0000 mg | Freq: Once | INTRAMUSCULAR | Status: AC
  Administered 2021-07-11: 4 mg via INTRAVENOUS
  Filled 2021-07-11: qty 2

## 2021-07-11 MED ORDER — SUGAMMADEX SODIUM 500 MG/5ML IV SOLN
INTRAVENOUS | Status: DC | PRN
  Administered 2021-07-11: 250 mg via INTRAVENOUS

## 2021-07-11 MED ORDER — FENTANYL CITRATE (PF) 100 MCG/2ML IJ SOLN
INTRAMUSCULAR | Status: AC
  Filled 2021-07-11: qty 2

## 2021-07-11 MED ORDER — ONDANSETRON HCL 4 MG/2ML IJ SOLN
4.0000 mg | Freq: Four times a day (QID) | INTRAMUSCULAR | Status: DC | PRN
Start: 2021-07-11 — End: 2021-07-12

## 2021-07-11 MED ORDER — ONDANSETRON HCL 4 MG/2ML IJ SOLN
INTRAMUSCULAR | Status: AC
  Filled 2021-07-11: qty 2

## 2021-07-11 MED ORDER — MIDAZOLAM HCL 2 MG/2ML IJ SOLN
INTRAMUSCULAR | Status: AC
  Filled 2021-07-11: qty 2

## 2021-07-11 MED ORDER — SUGAMMADEX SODIUM 500 MG/5ML IV SOLN
INTRAVENOUS | Status: AC
  Filled 2021-07-11: qty 5

## 2021-07-11 MED ORDER — FENTANYL CITRATE (PF) 250 MCG/5ML IJ SOLN
INTRAMUSCULAR | Status: DC | PRN
Start: 2021-07-11 — End: 2021-07-11
  Administered 2021-07-11: 100 ug via INTRAVENOUS

## 2021-07-11 MED ORDER — HYDROMORPHONE HCL 1 MG/ML IJ SOLN
1.0000 mg | Freq: Once | INTRAMUSCULAR | Status: AC
  Administered 2021-07-11: 1 mg via INTRAVENOUS
  Filled 2021-07-11: qty 1

## 2021-07-11 MED ORDER — DEXAMETHASONE SODIUM PHOSPHATE 10 MG/ML IJ SOLN
INTRAMUSCULAR | Status: AC
  Filled 2021-07-11: qty 1

## 2021-07-11 MED ORDER — ONDANSETRON HCL 4 MG/2ML IJ SOLN
4.0000 mg | Freq: Once | INTRAMUSCULAR | Status: AC
Start: 2021-07-11 — End: 2021-07-11
  Administered 2021-07-11: 4 mg via INTRAVENOUS
  Filled 2021-07-11: qty 2

## 2021-07-11 MED ORDER — BUPIVACAINE-EPINEPHRINE (PF) 0.25% -1:200000 IJ SOLN
INTRAMUSCULAR | Status: AC
  Filled 2021-07-11: qty 30

## 2021-07-11 MED ORDER — DEXAMETHASONE SODIUM PHOSPHATE 10 MG/ML IJ SOLN
INTRAMUSCULAR | Status: DC | PRN
  Administered 2021-07-11: 10 mg via INTRAVENOUS

## 2021-07-11 MED ORDER — DEXMEDETOMIDINE (PRECEDEX) IN NS 20 MCG/5ML (4 MCG/ML) IV SYRINGE
PREFILLED_SYRINGE | INTRAVENOUS | Status: AC
  Filled 2021-07-11: qty 5

## 2021-07-11 MED ORDER — FLUOXETINE HCL 20 MG PO CAPS
20.0000 mg | ORAL_CAPSULE | Freq: Every day | ORAL | Status: DC
  Administered 2021-07-11: 20 mg via ORAL
  Filled 2021-07-11 (×2): qty 1

## 2021-07-11 MED ORDER — VALSARTAN 80 MG PO TABS
80.0000 mg | ORAL_TABLET | Freq: Every day | ORAL | Status: DC
Start: 2021-07-11 — End: 2021-07-12
  Administered 2021-07-11: 80 mg via ORAL
  Filled 2021-07-11: qty 1

## 2021-07-11 MED ORDER — CHLORHEXIDINE GLUCONATE 0.12 % MT SOLN
15.0000 mL | Freq: Once | OROMUCOSAL | Status: AC
  Administered 2021-07-11: 15 mL via OROMUCOSAL

## 2021-07-11 MED ORDER — LACTATED RINGERS IR SOLN
Status: DC | PRN
  Administered 2021-07-11: 1000 mL

## 2021-07-11 MED ORDER — HYDROMORPHONE HCL 1 MG/ML IJ SOLN
1.0000 mg | INTRAMUSCULAR | Status: DC | PRN
  Administered 2021-07-11: 1 mg via INTRAVENOUS
  Filled 2021-07-11: qty 1

## 2021-07-11 MED ORDER — LIDOCAINE 2% (20 MG/ML) 5 ML SYRINGE
INTRAMUSCULAR | Status: DC | PRN
  Administered 2021-07-11: 80 mg via INTRAVENOUS

## 2021-07-11 MED ORDER — LORATADINE 10 MG PO TABS
10.0000 mg | ORAL_TABLET | Freq: Every day | ORAL | Status: DC
  Administered 2021-07-11: 10 mg via ORAL
  Filled 2021-07-11 (×2): qty 1

## 2021-07-11 MED ORDER — NON FORMULARY: 80.0000 mg | Freq: Every day | Status: DC

## 2021-07-11 MED ORDER — ORAL CARE MOUTH RINSE: 15.0000 mL | Freq: Once | OROMUCOSAL | Status: AC

## 2021-07-11 MED ORDER — MORPHINE SULFATE (PF) 2 MG/ML IV SOLN: 2.0000 mg | INTRAVENOUS | Status: DC | PRN

## 2021-07-11 MED ORDER — POTASSIUM CHLORIDE 20 MEQ PO PACK
40.0000 meq | PACK | Freq: Once | ORAL | Status: AC
  Administered 2021-07-11: 40 meq via ORAL
  Filled 2021-07-11: qty 2

## 2021-07-11 MED ORDER — KETOROLAC TROMETHAMINE 30 MG/ML IJ SOLN
INTRAMUSCULAR | Status: DC | PRN
  Administered 2021-07-11: 30 mg via INTRAVENOUS

## 2021-07-11 SURGICAL SUPPLY — 48 items
APL PRP STRL LF DISP 70% ISPRP (MISCELLANEOUS) ×1
APL SKNCLS STERI-STRIP NONHPOA (GAUZE/BANDAGES/DRESSINGS) ×1
APPLIER CLIP ROT 10 11.4 M/L (STAPLE) ×2
APR CLP MED LRG 11.4X10 (STAPLE) ×1
BAG RETRIEVAL 10 (BASKET)
BAG SPEC RTRVL 10 TROC 200 (ENDOMECHANICALS) ×1
BENZOIN TINCTURE PRP APPL 2/3 (GAUZE/BANDAGES/DRESSINGS) ×3 IMPLANT
CABLE HIGH FREQUENCY MONO STRZ (ELECTRODE) ×3 IMPLANT
CHLORAPREP W/TINT 26 (MISCELLANEOUS) ×3 IMPLANT
CLIP APPLIE ROT 10 11.4 M/L (STAPLE) ×2 IMPLANT
COVER MAYO STAND XLG (MISCELLANEOUS) ×3 IMPLANT
COVER SURGICAL LIGHT HANDLE (MISCELLANEOUS) ×3 IMPLANT
DRAPE C-ARM 42X120 X-RAY (DRAPES) ×3 IMPLANT
DRSG TEGADERM 2-3/8X2-3/4 SM (GAUZE/BANDAGES/DRESSINGS) ×9 IMPLANT
DRSG TEGADERM 4X4.75 (GAUZE/BANDAGES/DRESSINGS) ×3 IMPLANT
ELECT REM PT RETURN 15FT ADLT (MISCELLANEOUS) ×3 IMPLANT
GAUZE SPONGE 2X2 8PLY STRL LF (GAUZE/BANDAGES/DRESSINGS) IMPLANT
GLOVE BIO SURGEON STRL SZ7 (GLOVE) ×3 IMPLANT
GLOVE BIOGEL PI IND STRL 7.5 (GLOVE) ×2 IMPLANT
GLOVE BIOGEL PI INDICATOR 7.5 (GLOVE) ×1
GOWN STRL REUS W/ TWL LRG LVL3 (GOWN DISPOSABLE) ×2 IMPLANT
GOWN STRL REUS W/TWL LRG LVL3 (GOWN DISPOSABLE) ×2
IRRIG SUCT STRYKERFLOW 2 WTIP (MISCELLANEOUS) ×2
IRRIGATION SUCT STRKRFLW 2 WTP (MISCELLANEOUS) ×2 IMPLANT
KIT BASIN OR (CUSTOM PROCEDURE TRAY) ×3 IMPLANT
KIT TURNOVER KIT A (KITS) ×1 IMPLANT
NS IRRIG 1000ML POUR BTL (IV SOLUTION) ×3 IMPLANT
POUCH RETRIEVAL ECOSAC 10 (ENDOMECHANICALS) IMPLANT
POUCH RETRIEVAL ECOSAC 10MM (ENDOMECHANICALS) ×2
PROTECTOR NERVE ULNAR (MISCELLANEOUS) IMPLANT
SCISSORS LAP 5X35 DISP (ENDOMECHANICALS) ×3 IMPLANT
SET CHOLANGIOGRAPH MIX (MISCELLANEOUS) ×3 IMPLANT
SET TUBE SMOKE EVAC HIGH FLOW (TUBING) ×3 IMPLANT
SPIKE FLUID TRANSFER (MISCELLANEOUS) ×3 IMPLANT
SPONGE GAUZE 2X2 STER 10/PKG (GAUZE/BANDAGES/DRESSINGS) ×1
STRIP CLOSURE SKIN 1/2X4 (GAUZE/BANDAGES/DRESSINGS) ×3 IMPLANT
SUT MNCRL AB 4-0 PS2 18 (SUTURE) ×3 IMPLANT
SYR 20ML LL LF (SYRINGE) ×1 IMPLANT
SYS BAG RETRIEVAL 10MM (BASKET)
SYSTEM BAG RETRIEVAL 10MM (BASKET) IMPLANT
TAPE CLOTH 4X10 WHT NS (GAUZE/BANDAGES/DRESSINGS) IMPLANT
TOWEL OR 17X26 10 PK STRL BLUE (TOWEL DISPOSABLE) ×3 IMPLANT
TOWEL OR NON WOVEN STRL DISP B (DISPOSABLE) ×3 IMPLANT
TRAY LAPAROSCOPIC (CUSTOM PROCEDURE TRAY) ×3 IMPLANT
TROCAR BALLN 12MMX100 BLUNT (TROCAR) ×3 IMPLANT
TROCAR BLADELESS OPT 5 100 (ENDOMECHANICALS) ×6 IMPLANT
TROCAR XCEL NON-BLD 11X100MML (ENDOMECHANICALS) ×3 IMPLANT
WATER STERILE IRR 1000ML POUR (IV SOLUTION) ×3 IMPLANT

## 2021-07-11 NOTE — Anesthesia Preprocedure Evaluation (Addendum)
Anesthesia Evaluation  ?Patient identified by MRN, date of birth, ID band ?Patient awake ? ? ? ?Reviewed: ?Allergy & Precautions, H&P , NPO status , Patient's Chart, lab work & pertinent test results ? ?History of Anesthesia Complications ?(+) PONV and history of anesthetic complications ? ?Airway ?Mallampati: II ? ?TM Distance: >3 FB ?Neck ROM: Full ? ? ? Dental ?no notable dental hx. ?(+) Teeth Intact, Dental Advisory Given ?  ?Pulmonary ?neg pulmonary ROS,  ?  ?Pulmonary exam normal ?breath sounds clear to auscultation ? ? ? ? ? ? Cardiovascular ?hypertension, Pt. on medications ?Normal cardiovascular exam ?Rhythm:Regular Rate:Normal ? ? ?  ?Neuro/Psych ? Headaches, Depression   ? GI/Hepatic ?negative GI ROS, Neg liver ROS,   ?Endo/Other  ?diabetes, Type obesity ? Renal/GU ?negative Renal ROS  ?negative genitourinary ?  ?Musculoskeletal ? ?(+) Arthritis , Osteoarthritis,   ? Abdominal ?  ?Peds ? Hematology ?negative hematology ROS ?(+)   ?Anesthesia Other Findings ?All: Erythro, Amoxicillin, Sulfa, Ceclor, Enalapril ? Reproductive/Obstetrics ?negative OB ROS ? ?  ? ? ? ? ? ? ? ? ? ? ? ? ? ?  ?  ? ? ? ? ? ? ? ?Anesthesia Physical ?Anesthesia Plan ? ?ASA: 3 ? ?Anesthesia Plan: General  ? ?Post-op Pain Management: Toradol IV (intra-op)*  ? ?Induction: Intravenous ? ?PONV Risk Score and Plan: 4 or greater and Ondansetron, Dexamethasone and Midazolam ? ?Airway Management Planned: Oral ETT ? ?Additional Equipment:  ? ?Intra-op Plan:  ? ?Post-operative Plan: Extubation in OR ? ?Informed Consent: I have reviewed the patients History and Physical, chart, labs and discussed the procedure including the risks, benefits and alternatives for the proposed anesthesia with the patient or authorized representative who has indicated his/her understanding and acceptance.  ? ? ? ?Dental advisory given ? ?Plan Discussed with: CRNA ? ?Anesthesia Plan Comments:   ? ? ? ? ? ? ?Anesthesia Quick  Evaluation ? ?

## 2021-07-11 NOTE — Anesthesia Postprocedure Evaluation (Signed)
Anesthesia Post Note ? ?Patient: Chelsea [name] ? ?Procedure(s) Performed: LAPAROSCOPIC CHOLECYSTECTOMY WITH INTRAOPERATIVE CHOLANGIOGRAM ? ?  ? ?Patient location during evaluation: PACU ?Anesthesia Type: General ?Level of consciousness: awake and alert ?Pain management: pain level controlled ?Vital Signs Assessment: post-procedure vital signs reviewed and stable ?Respiratory status: spontaneous breathing, nonlabored ventilation, respiratory function stable and patient connected to nasal cannula oxygen ?Cardiovascular status: blood pressure returned to baseline and stable ?Postop Assessment: no apparent nausea or vomiting ?Anesthetic complications: no ? ? ?No notable events documented. ? ?Last Vitals:  ?Vitals:  ? 07/11/21 1509 07/11/21 1643  ?BP: 131/77 130/75  ?Pulse: 80 84  ?Resp: 16 16  ?Temp: 36.7 ?C   ?SpO2: 95% 94%  ?  ?Last Pain:  ?Vitals:  ? 07/11/21 1400  ?TempSrc:   ?PainSc: 2   ? ? ?  ?  ?  ?  ?  ?  ? ?Trevor Iha ? ? ? ? ?

## 2021-07-11 NOTE — H&P (Signed)
Chelsea [name] is an 36 y.o. female.   ?Chief Complaint: abdominal pain ?HPI: Pt presented to the [facility] ED with now around 10 hours of unremitting RUQ pain that came on after eating around 4 pm Tuesday 5/16.  She had n/v, no f/c.  CT showed sludge and gallstones, but her WBCs were 16.7k and she had multiple doses of IV narcotics without relief.  ? ?She was transferred to [facility] ED for further management.  She remains fairly tender with nausea and vomiting, as well as abdominal bloating.  One episode of diarrhea. ? ?Past Medical History:  ?Diagnosis Date  ? Depression   ? Endometrial polyp   ? Fibromyalgia   ? History of recurrent UTIs   ? Hypertension   ? Infertility, anovulation   ? Myofascial muscle pain   ? OA (osteoarthritis) of knee   ? BILATERAL  ? PCOS (polycystic ovarian syndrome)   ? PONV (postoperative nausea and vomiting)   ? Seasonal allergic rhinitis   ? ? ?Past Surgical History:  ?Procedure Laterality Date  ? HYSTEROSCOPY WITH D & C N/A 09/07/2014  ? Procedure:  DILATATION AND CURETTAGE /HYSTEROSCOPY/POLYPECTOMY;  Surgeon: Fermin Schwab, MD;  Location: [facility];  Service: Gynecology;  Laterality: N/A;  ? I & D RIGHT FOOT  1998  ? LAPAROSCOPIC GASTRIC SLEEVE RESECTION N/A 10/20/2017  ? Procedure: LAPAROSCOPIC GASTRIC SLEEVE RESECTION, UPPER ENDO, ERAS Pathway;  Surgeon: Berna Bue, MD;  Location: [facility];  Service: General;  Laterality: N/A;  ? POLYPECTOMY    ? uterine  ? TONSILLECTOMY  2001  ? WISDOM TOOTH EXTRACTION  2004  ? ? ?Family History  ?Problem Relation Age of Onset  ? Diabetes Mother   ? Polycystic ovary syndrome Mother   ? Hypertension Father   ? Diabetes Father   ? Fibromyalgia Maternal Aunt   ? Fibromyalgia Cousin   ? Cancer Paternal Grandfather   ?     prostate  ? Glaucoma Paternal Grandmother   ? ?Social History:  reports that she has never smoked. She has never used smokeless tobacco. She reports that she does not drink alcohol and does not use  drugs. ? ?Allergies:  ?Allergies  ?Allergen Reactions  ? Enalapril Maleate Swelling  ?  lip swelling  ? Erythromycin Diarrhea  ?  Severe yeast infection  ? Savella [Milnacipran] Hypertension  ? Amoxicillin Hives, Rash and Other (See Comments)  ?  Caused yeast infection ?Has patient had a PCN reaction causing immediate rash, facial/tongue/throat swelling, SOB or lightheadedness with hypotension: Unknown ?Has patient had a PCN reaction causing severe rash involving mucus membranes or skin necrosis: No ?Has patient had a PCN reaction that required hospitalization: No ?Has patient had a PCN reaction occurring within the last 10 years: No ?Childhood allergy ?If all of the above answers are "NO", then may proceed with Cephalosporin use. ?  ? Penicillins Hives, Rash and Other (See Comments)  ?  Caused yeast infection ?Has patient had a PCN reaction causing immediate rash, facial/tongue/throat swelling, SOB or lightheadedness with hypotension: Unknown ?Has patient had a PCN reaction causing severe rash involving mucus membranes or skin necrosis: No ?Has patient had a PCN reaction that required hospitalization: No ?Has patient had a PCN reaction occurring within the last 10 years: No ?Childhood allergy ?If all of the above answers are "NO", then may proceed with Cephalosporin use. ? ?  ? Sulfa Antibiotics Hives and Rash  ?  Per mom at bedside  ? ? ?  Medications: ?cyclobenzaprine (FLEXERIL) 5 MG tablet ?FLUoxetine (PROZAC) 20 MG capsule ?loratadine (CLARITIN) 10 MG tablet ?Semaglutide, 2 MG/DOSE, 8 MG/3ML SOPN ?topiramate (TOPAMAX) 100 MG tablet ?valsartan (DIOVAN) 160 MG tablet ?ALPRAZolam (XANAX) 0.5 MG tablet ?NOVOFINE PLUS PEN NEEDLE 32G X 4 MM MISC ? ? ?Results for orders placed or performed during the hospital encounter of 07/10/21 (from the past 48 hour(s))  ?Lipase, blood     Status: None  ? Collection Time: 07/10/21  7:29 PM  ?Result Value Ref Range  ? Lipase 42 11 - 51 U/L  ?  Comment: Performed at [facility], [address] [place], [place] [address]  ?Comprehensive metabolic panel     Status: Abnormal  ? Collection Time: 07/10/21  7:29 PM  ?Result Value Ref Range  ? Sodium 140 135 - 145 mmol/L  ? Potassium 3.5 3.5 - 5.1 mmol/L  ? Chloride 112 (H) 98 - 111 mmol/L  ? CO2 21 (L) 22 - 32 mmol/L  ? Glucose, Bld 128 (H) 70 - 99 mg/dL  ?  Comment: Glucose reference range applies only to samples taken after fasting for at least 8 hours.  ? BUN 12 6 - 20 mg/dL  ? Creatinine, Ser 0.88 0.44 - 1.00 mg/dL  ? Calcium 8.9 8.9 - 10.3 mg/dL  ? Total Protein 7.6 6.5 - 8.1 g/dL  ? Albumin 4.1 3.5 - 5.0 g/dL  ? AST 23 15 - 41 U/L  ? ALT 49 (H) 0 - 44 U/L  ? Alkaline Phosphatase 108 38 - 126 U/L  ? Total Bilirubin 0.8 0.3 - 1.2 mg/dL  ? GFR, Estimated >60 >60 mL/min  ?  Comment: (NOTE) ?Calculated using the CKD-EPI Creatinine Equation (2021) ?  ? Anion gap 7 5 - 15  ?  Comment: Performed at [facility], [address] [place], [place] [address]  ?Urinalysis, Routine w reflex microscopic Urine, Clean Catch     Status: Abnormal  ? Collection Time: 07/10/21  7:29 PM  ?Result Value Ref Range  ? Color, Urine YELLOW YELLOW  ? APPearance CLOUDY (A) CLEAR  ? Specific Gravity, Urine >=1.030 1.005 - 1.030  ? pH 5.5 5.0 - 8.0  ? Glucose, UA NEGATIVE NEGATIVE mg/dL  ? Hgb urine dipstick LARGE (A) NEGATIVE  ? Bilirubin Urine SMALL (A) NEGATIVE  ? Ketones, ur 40 (A) NEGATIVE mg/dL  ? Protein, ur 30 (A) NEGATIVE mg/dL  ? Nitrite NEGATIVE NEGATIVE  ? Leukocytes,Ua NEGATIVE NEGATIVE  ?  Comment: Performed at [facility], [address], [place], [place] [address]  ?Pregnancy, urine     Status: None  ? Collection Time: 07/10/21  7:29 PM  ?Result Value Ref Range  ? Preg Test, Ur NEGATIVE NEGATIVE  ?  Comment:        ?THE SENSITIVITY OF THIS ?METHODOLOGY IS >20 mIU/mL. ?Performed at [facility], [address] [place], [place] [address] ?  ?CBC with Differential     Status: Abnormal  ? Collection Time: 07/10/21   7:29 PM  ?Result Value Ref Range  ? WBC 16.7 (H) 4.0 - 10.5 K/uL  ? RBC 4.96 3.87 - 5.11 MIL/uL  ? Hemoglobin 14.6 12.0 - 15.0 g/dL  ? HCT 42.0 36.0 - 46.0 %  ? MCV 84.7 80.0 - 100.0 fL  ? MCH 29.4 26.0 - 34.0 pg  ? MCHC 34.8 30.0 - 36.0 g/dL  ? RDW 13.2 11.5 - 15.5 %  ? Platelets  238 150 - 400 K/uL  ? nRBC 0.0 0.0 - 0.2 %  ? Neutrophils Relative % 87 %  ? Neutro Abs 14.4 (H) 1.7 - 7.7 K/uL  ? Lymphocytes Relative 11 %  ? Lymphs Abs 1.8 0.7 - 4.0 K/uL  ? Monocytes Relative 2 %  ? Monocytes Absolute 0.3 0.1 - 1.0 K/uL  ? Eosinophils Relative 0 %  ? Eosinophils Absolute 0.0 0.0 - 0.5 K/uL  ? Basophils Relative 0 %  ? Basophils Absolute 0.1 0.0 - 0.1 K/uL  ? Immature Granulocytes 0 %  ? Abs Immature Granulocytes 0.07 0.00 - 0.07 K/uL  ?  Comment: Performed at [facility], [address] [place], [place] [address]  ?Urinalysis, Microscopic (reflex)     Status: Abnormal  ? Collection Time: 07/10/21  7:29 PM  ?Result Value Ref Range  ? RBC / HPF 11-20 0 - 5 RBC/hpf  ? WBC, UA 0-5 0 - 5 WBC/hpf  ? Bacteria, UA MANY (A) NONE SEEN  ? Squamous Epithelial / LPF 0-5 0 - 5  ? Mucus PRESENT   ?  Comment: Performed at [facility], [address] [place], [place] [address]  ? ?CT ABDOMEN PELVIS W CONTRAST ? ?Result Date: 07/10/2021 ?CLINICAL DATA:  Right abdominal pain, nausea/vomiting, vaginal bleeding EXAM: CT ABDOMEN AND PELVIS WITH CONTRAST TECHNIQUE: Multidetector CT imaging of the abdomen and pelvis was performed using the standard protocol following bolus administration of intravenous contrast. RADIATION DOSE REDUCTION: This exam was performed according to the departmental dose-optimization program which includes automated exposure control, adjustment of the mA and/or kV according to patient size and/or use of iterative reconstruction technique. CONTRAST:  125mL OMNIPAQUE IOHEXOL 300 MG/ML  SOLN COMPARISON:  None Available. FINDINGS: Lower chest: Lung bases are clear. Hepatobiliary: Liver is within  normal limits. Layering tiny gallstones (series 2/image 33), without associated inflammatory changes. No intrahepatic or extrahepatic ductal dilatation. Pancreas: Within normal limits. Spleen: Within normal

## 2021-07-11 NOTE — Op Note (Signed)
Laparoscopic Cholecystectomy Procedure Note ? ?Indications: This patient presents with symptomatic gallbladder disease and will undergo laparoscopic cholecystectomy. ? ?Pre-operative Diagnosis: Calculus of gallbladder with acute cholecystitis, without mention of obstruction ? ?Post-operative Diagnosis: Same ? ?Surgeon: Wynona Luna  ? ?Resident:  Dr. Jolene Schimke, PGY-4 ?I was personally present during the key and critical portions of this procedure and immediately available throughout the entire procedure, as documented in my operative note. ? ?Anesthesia: General endotracheal anesthesia ? ?ASA Class: 2 ? ?Procedure Details  ?The patient was seen again in the Holding Room. The risks, benefits, complications, treatment options, and expected outcomes were discussed with the patient. The possibilities of reaction to medication, pulmonary aspiration, perforation of viscus, bleeding, recurrent infection, finding a normal gallbladder, the need for additional procedures, failure to diagnose a condition, the possible need to convert to an open procedure, and creating a complication requiring transfusion or operation were discussed with the patient. The likelihood of improving the patient's symptoms with return to their baseline status is good.  The patient and/or family concurred with the proposed plan, giving informed consent. The site of surgery properly noted. The patient was taken to Operating Room, identified as Chelsea [name] and the procedure verified as Laparoscopic Cholecystectomy with Intraoperative Cholangiogram. A Time Out was held and the above information confirmed. ? ?Prior to the induction of general anesthesia, antibiotic prophylaxis was administered. General endotracheal anesthesia was then administered and tolerated well. After the induction, the abdomen was prepped with Chloraprep and draped in sterile fashion. The patient was positioned in the supine position. ? ?Local anesthetic agent was  injected into the skin above the umbilicus and an incision made.  We dissected down to the abdominal fascia with blunt dissection.  The fascia was incised vertically and we entered the peritoneal cavity bluntly.  A pursestring suture of 0-Vicryl was placed around the fascial opening.  The Hasson cannula was inserted and secured with the stay suture.  Pneumoperitoneum was then created with CO2 and tolerated well without any adverse changes in the patient's vital signs. An 11-mm port was placed in the subxiphoid position.  Two 5-mm ports were placed in the right upper quadrant. All skin incisions were infiltrated with a local anesthetic agent before making the incision and placing the trocars.  ? ?We positioned the patient in reverse Trendelenburg, tilted slightly to the patient's left.  The omentum is densely adherent to the gallbladder.  We bluntly dissected the omentum away and visualized a very thickened, inflamed gallbladder.   The gallbladder was identified, the fundus grasped and retracted cephalad. Adhesions were lysed bluntly and with the electrocautery where indicated, taking care not to injure any adjacent organs or viscus. The infundibulum was grasped and retracted laterally, exposing the peritoneum overlying the triangle of Calot. This was then divided and exposed in a blunt fashion. The cystic duct was clearly identified and bluntly dissected circumferentially. A critical view of the cystic duct and cystic artery was obtained.  We decided not to pursue a cholangiogram because of the relatively short appearance of the cystic duct.  The cystic duct was then ligated with clips and divided. The cystic artery was, dissected free, ligated with clips and divided as well.  ? ?The gallbladder was dissected from the liver bed in retrograde fashion with the electrocautery. The gallbladder was removed and placed in an Eco sac. The liver bed was irrigated and inspected. Hemostasis was achieved with the  electrocautery. Copious irrigation was utilized and was repeatedly aspirated until clear.  The gallbladder and Eco sac were then removed through the umbilical port site.  The pursestring suture was used to close the umbilical fascia.   ? ?We again inspected the right upper quadrant for hemostasis.  Pneumoperitoneum was released as we removed the trocars.  4-0 Monocryl was used to close the skin.   Benzoin, steri-strips, and clean dressings were applied. The patient was then extubated and brought to the recovery room in stable condition. Instrument, sponge, and needle counts were correct at closure and at the conclusion of the case.  ? ?Findings: ?Cholecystitis with Cholelithiasis ? ?Estimated Blood Loss: less than 50 mL ?        ?Drains: none ?        ?Specimens: Gallbladder     ?      ?Complications: None; patient tolerated the procedure well. ?        ?Disposition: PACU - hemodynamically stable. ?        ?Condition: stable ? ?Wilmon Arms. Duha Abair, MD, FACS ?[facility]  ?General Surgery ? ? ?07/11/2021 ?1:16 PM ? ? ? ? ?

## 2021-07-11 NOTE — Anesthesia Procedure Notes (Signed)
Procedure Name: Intubation ?Date/Time: 07/11/2021 11:59 AM ?Performed by: Keyana Guevara D, CRNA ?Pre-anesthesia Checklist: Patient identified, Emergency Drugs available, Suction available and Patient being monitored ?Patient Re-evaluated:Patient Re-evaluated prior to induction ?Oxygen Delivery Method: Circle system utilized ?Preoxygenation: Pre-oxygenation with 100% oxygen ?Induction Type: IV induction ?Ventilation: Mask ventilation without difficulty ?Laryngoscope Size: Mac and 4 ?Grade View: Grade I ?Tube type: Oral ?Tube size: 7.0 mm ?Number of attempts: 1 ?Airway Equipment and Method: Stylet ?Placement Confirmation: ETT inserted through vocal cords under direct vision, positive ETCO2 and breath sounds checked- equal and bilateral ?Tube secured with: Tape ?Dental Injury: Teeth and Oropharynx as per pre-operative assessment  ? ? ? ? ?

## 2021-07-11 NOTE — ED Provider Notes (Signed)
1:56 AM ?Patient continues to have pain requiring additional narcotic medication.  She is persistent Murphy sign.  Discussed with Dr. Donell Beers of general surgery.  We will have the patient transferred to the [facility] long ED for evaluation by general surgery and consideration of emergent cholecystectomy later today.  Dr. Ross Marcus is the accepting EDP. ? ? ?  ?Burdette Gergely, MD ?07/11/21 0157 ? ?

## 2021-07-11 NOTE — Addendum Note (Signed)
Addendum  created 07/11/21 1728 by Marijo Conception, CRNA  ? Intraprocedure Event edited  ?  ?

## 2021-07-11 NOTE — Transfer of Care (Signed)
Immediate Anesthesia Transfer of Care Note ? ?Patient: Chelsea [name] ? ?Procedure(s) Performed: LAPAROSCOPIC CHOLECYSTECTOMY WITH INTRAOPERATIVE CHOLANGIOGRAM ? ?Patient Location: PACU ? ?Anesthesia Type:General ? ?Level of Consciousness: awake, alert  and oriented ? ?Airway & Oxygen Therapy: Patient Spontanous Breathing and Patient connected to face mask oxygen ? ?Post-op Assessment: Report given to RN and Post -op Vital signs reviewed and stable ? ?Post vital signs: Reviewed and stable ? ?Last Vitals:  ?Vitals Value Taken Time  ?BP 135/88 07/11/21 1330  ?Temp    ?Pulse 86 07/11/21 1332  ?Resp 14 07/11/21 1332  ?SpO2 98 % 07/11/21 1332  ?Vitals shown include unvalidated device data. ? ?Last Pain:  ?Vitals:  ? 07/11/21 1042  ?TempSrc:   ?PainSc: 5   ?   ? ?Patients Stated Pain Goal: 3 (07/11/21 1042) ? ?Complications: No notable events documented. ?

## 2021-07-11 NOTE — Progress Notes (Signed)
Transition of Care (TOC) Screening Note ? ?Patient Details  ?Name: Chelsea [name] ?Date of Birth: 07/31/1985 ? ?Transition of Care (TOC) CM/SW Contact:    ?Ewing Schlein, LCSW ?Phone Number: ?07/11/2021, 12:29 PM ? ?Transition of Care Department [facility]) has reviewed patient and no TOC needs have been identified at this time. We will continue to monitor patient advancement through interdisciplinary progression rounds. If new patient transition needs arise, please place a TOC consult. ?

## 2021-07-11 NOTE — ED Notes (Signed)
Pt ambulatory without assistance.  

## 2021-07-11 NOTE — ED Provider Notes (Signed)
Patient arrived from [facility].  Concern for ongoing biliary colic and early cholecystitis.  Stable upon arrival here with vital signs reassuring.  General surgery was consulted.  Dr. Donell Beers to evaluate.  Likely cholecystectomy today with possible discharge. ?  ?Shon Baton, MD ?07/11/21 0407 ? ?

## 2021-07-11 NOTE — Progress Notes (Signed)
Dr. Richardson Landry aware of Potassium of 2.8. No further orders received. Proceed with procedure ?

## 2021-07-11 NOTE — ED Notes (Signed)
Report given to short stay receiving nurse via phone call at this time.  ?

## 2021-07-11 NOTE — ED Notes (Signed)
Pt gown changed.

## 2021-07-12 ENCOUNTER — Encounter (HOSPITAL_COMMUNITY): Payer: Self-pay | Admitting: Surgery

## 2021-07-12 ENCOUNTER — Telehealth: Payer: Self-pay

## 2021-07-12 LAB — COMPREHENSIVE METABOLIC PANEL
ALT: 187 U/L — ABNORMAL HIGH (ref 0–44)
AST: 117 U/L — ABNORMAL HIGH (ref 15–41)
Albumin: 3.2 g/dL — ABNORMAL LOW (ref 3.5–5.0)
Alkaline Phosphatase: 143 U/L — ABNORMAL HIGH (ref 38–126)
Anion gap: 9 (ref 5–15)
BUN: 9 mg/dL (ref 6–20)
CO2: 22 mmol/L (ref 22–32)
Calcium: 8.3 mg/dL — ABNORMAL LOW (ref 8.9–10.3)
Chloride: 112 mmol/L — ABNORMAL HIGH (ref 98–111)
Creatinine, Ser: 0.74 mg/dL (ref 0.44–1.00)
GFR, Estimated: >60 mL/min (ref >60)
Glucose, Bld: 93 mg/dL (ref 70–99)
Potassium: 3.5 mmol/L (ref 3.5–5.1)
Sodium: 143 mmol/L (ref 135–145)
Total Bilirubin: 2.3 mg/dL — ABNORMAL HIGH (ref 0.3–1.2)
Total Protein: 6.3 g/dL — ABNORMAL LOW (ref 6.5–8.1)

## 2021-07-12 LAB — CBC
HCT: 37.4 % (ref 36.0–46.0)
Hemoglobin: 12.7 g/dL (ref 12.0–15.0)
MCH: 30 pg (ref 26.0–34.0)
MCHC: 34 g/dL (ref 30.0–36.0)
MCV: 88.4 fL (ref 80.0–100.0)
Platelets: 191 10*3/uL (ref 150–400)
RBC: 4.23 MIL/uL (ref 3.87–5.11)
RDW: 14.1 % (ref 11.5–15.5)
WBC: 12.5 10*3/uL — ABNORMAL HIGH (ref 4.0–10.5)
nRBC: 0 % (ref 0.0–0.2)

## 2021-07-12 LAB — URINE CULTURE: Culture: NO GROWTH

## 2021-07-12 LAB — SURGICAL PATHOLOGY

## 2021-07-12 MED ORDER — OXYCODONE HCL 5 MG PO TABS: 5.0000 mg | ORAL_TABLET | Freq: Four times a day (QID) | ORAL | 0 refills | Status: DC | PRN

## 2021-07-12 NOTE — Telephone Encounter (Signed)
Definitely wait until next week

## 2021-07-12 NOTE — Progress Notes (Signed)
Reviewed written d/c instructions w pt and all questions answered. She verbalized understanding. D/C via w/c w all belongings in stable condition. 

## 2021-07-12 NOTE — Discharge Summary (Signed)
[facility] Discharge Summary   Patient ID: Chelsea [name] MRN: [id number] DOB/AGE: 1985/06/18 36 y.o.  Admit date: 07/10/2021 Discharge date: 07/12/2021  Admitting Diagnosis: Acute cholecystitis   Discharge Diagnosis Patient Active Problem List   Diagnosis Date Noted   Acute calculous cholecystitis 07/11/2021   Acute cholecystitis due to biliary calculus 07/11/2021   Right ankle sprain 07/19/2020   Chronic pain syndrome 05/30/2020   Impingement syndrome of both shoulders 08/17/2019   Primary insomnia 05/11/2019   Paronychia of second toe, left 01/06/2019   Mixed hypercholesterolemia and hypertriglyceridemia 11/13/2018   Orgasmic headache 11/11/2018   Diet-controlled diabetes mellitus (HCC) 04/25/2017   Low back pain 08/24/2015   Intractable migraine without aura and with status migrainosus 06/24/2014   Myofascial pain syndrome with depression 02/08/2014   Venous stasis dermatitis 01/25/2014   Right hip pain 12/13/2013   Metatarsalgia of both feet 10/18/2013   Right knee pain 02/13/2012   Essential hypertension, benign 11/25/2008   POLYCYSTIC OVARIAN DISEASE 10/26/2008   History of gastric sleeve 10/26/2008    Consultants None   Imaging: CT ABDOMEN PELVIS W CONTRAST  Result Date: 07/10/2021 CLINICAL DATA:  Right abdominal pain, nausea/vomiting, vaginal bleeding EXAM: CT ABDOMEN AND PELVIS WITH CONTRAST TECHNIQUE: Multidetector CT imaging of the abdomen and pelvis was performed using the standard protocol following bolus administration of intravenous contrast. RADIATION DOSE REDUCTION: This exam was performed according to the departmental dose-optimization program which includes automated exposure control, adjustment of the mA and/or kV according to patient size and/or use of iterative reconstruction technique. CONTRAST:  OMNIPAQUE IOHEXOL 300 MG/ML  SOLN COMPARISON:  None Available. FINDINGS: Lower chest: Lung bases are clear. Hepatobiliary: Liver is  within normal limits. Layering tiny gallstones (series 2/image 33), without associated inflammatory changes. No intrahepatic or extrahepatic ductal dilatation. Pancreas: Within normal limits. Spleen: Within normal limits. Adrenals/Urinary Tract: Adrenal glands are within normal limits. Kidneys are within normal limits.  No hydronephrosis. Bladder is underdistended but unremarkable. Stomach/Bowel: Stomach is notable for postsurgical changes related to gastric sleeve. No evidence of bowel obstruction. Normal appendix (coronal image 50). No colonic wall thickening or inflammatory changes. Left colon is decompressed. Vascular/Lymphatic: No evidence of abdominal aortic aneurysm. Small upper abdominal lymph nodes, including a 13 mm short axis node in the porta hepatis (series 2/image 17) and a 13 mm short axis portacaval node (series 2/image 23). Reproductive: Uterus is within normal limits. Bilateral ovaries are within normal limits. Other: Trace right pelvic fluid, likely physiologic. Tiny fat containing right inguinal hernia (series 2/image 81). Musculoskeletal: Visualized osseous structures are within normal limits. IMPRESSION: No evidence of bowel obstruction.  Normal appendix. Cholelithiasis, without associated inflammatory changes. Small upper abdominal lymph nodes, likely reactive. Electronically Signed   By: Charline Bills M.D.   On: 07/10/2021 22:10   DG C-Arm 1-60 Min-No Report  Result Date: 07/11/2021 Fluoroscopy was utilized by the requesting physician.  No radiographic interpretation.   US Abdomen Limited RUQ (LIVER/GB)  Result Date: 07/11/2021 CLINICAL DATA:  Right upper quadrant pain EXAM: ULTRASOUND ABDOMEN LIMITED RIGHT UPPER QUADRANT COMPARISON:  CT from earlier in the same day. FINDINGS: Gallbladder: Gallbladder is well distended with multiple dependent gallstones. No wall thickening or pericholecystic fluid is noted. Negative sonographic Murphy's sign is elicited. Mild gallbladder sludge is  noted as well. Common bile duct: Diameter: 4.3 mm. Liver: No focal lesion identified. Within normal limits in parenchymal echogenicity. Portal vein is patent on color Doppler imaging with normal direction of blood flow towards the  liver. Other: None. IMPRESSION: Cholelithiasis and sludge.  No complicating factors noted. Electronically Signed   By: Alcide CleverMark  Lukens M.D.   On: 07/11/2021 00:00    Procedures Dr. Manus RuddMatthew Tsuei (07/11/21) - Laparoscopic Cholecystectomy   Hospital Course:  36 y/o F [facility] acute onset RUQ pain.  Workup showed cholelithiasis with a WBC 16.7K.  Patient was admitted and underwent procedure listed above.  Tolerated procedure well and was transferred to the floor.  Diet was advanced as tolerated.  On POD#1, the patient was voiding well, tolerating diet, ambulating well, pain well controlled, vital signs stable, incisions c/d/i and felt stable for discharge home.  Patient will follow up in our office in 2-3 weeks and knows to call with questions or concerns. She will call to confirm appointment date/time.    Of note her CT revealed a fat containing R inguinal hernia. We discussed the pathophysiology of inguinal hernia and the when to call our office vs go to the ED.   Physical Exam: General:  Alert, NAD, pleasant, comfortable Abd:  Soft, ND, mild tenderness, incisions C/D/I w/ Tegaderm dressings in place. No palpable bulge or tenderness of the R groin.  Allergies as of 07/12/2021       Reactions   Enalapril Maleate Swelling   lip swelling   Erythromycin Diarrhea   Severe yeast infection   Savella [milnacipran] Hypertension   Amoxicillin Hives, Rash, Other (See Comments)   Caused yeast infection Has patient had a PCN reaction causing immediate rash, facial/tongue/throat swelling, SOB or lightheadedness with hypotension: Unknown Has patient had a PCN reaction causing severe rash involving mucus membranes or skin necrosis: No Has patient had a PCN reaction  that required hospitalization: No Has patient had a PCN reaction occurring within the last 10 years: No Childhood allergy If all of the above answers are "NO", then may proceed with Cephalosporin use.   Penicillins Hives, Rash, Other (See Comments)   Caused yeast infection Has patient had a PCN reaction causing immediate rash, facial/tongue/throat swelling, SOB or lightheadedness with hypotension: Unknown Has patient had a PCN reaction causing severe rash involving mucus membranes or skin necrosis: No Has patient had a PCN reaction that required hospitalization: No Has patient had a PCN reaction occurring within the last 10 years: No Childhood allergy If all of the above answers are "NO", then may proceed with Cephalosporin use.   Sulfa Antibiotics Hives, Rash   Per mom at bedside        Medication List     TAKE these medications    ALPRAZolam 0.5 MG tablet Commonly known as: XANAX Take 1 tablet (0.5 mg total) by mouth 3 (three) times daily as needed for anxiety.   cyclobenzaprine 5 MG tablet Commonly known as: FLEXERIL TAKE 1 OR 2 EVERY 8 HOURS AS NEEDED FOR MUSCLE RELAXANT. CAUTION: MAY CAUSE DROWSINESS. What changed: See the new instructions.   FLUoxetine 20 MG capsule Commonly known as: PROZAC TAKE 1 CAPSULE BY MOUTH EVERY DAY What changed: how much to take   loratadine 10 MG tablet Commonly known as: CLARITIN Take 10 mg by mouth daily.   NovoFine Plus Pen Needle 32G X 4 MM Misc Generic drug: Insulin Pen Needle USE WEEKLY FOR OZEMPIC INJECTIONS   oxyCODONE 5 MG immediate release tablet Commonly known as: Oxy IR/ROXICODONE Take 1 tablet (5 mg total) by mouth every 6 (six) hours as needed for moderate pain (pain not relieved by tylenol).   Semaglutide (2 MG/DOSE) 8 MG/3ML Sopn  Inject 2 mg as directed once a week. What changed: additional instructions   topiramate 100 MG tablet Commonly known as: TOPAMAX Take 100 mg by mouth 2 (two) times daily.   valsartan  160 MG tablet Commonly known as: DIOVAN Take 0.5 tablets (80 mg total) by mouth daily.          Follow-up Information     Maczis, Hedda Slade, PA-C Follow up.   Specialty: General Surgery Why: our office is scheduling you for post-operative follow up, please call to confirm appointment date/time. Contact information: [address] [place] [address] [phone]                 Signed: Hosie Spangle, [facility] 07/12/2021, 8:29 AM

## 2021-07-12 NOTE — Telephone Encounter (Signed)
Patient called to report missing a dose of her Ozempic due to having surgery. Should she go ahead and take it or wait til next week?

## 2021-07-12 NOTE — Discharge Instructions (Signed)
[facility] LAPAROSCOPIC SURGERY: POST OP INSTRUCTIONS Always review your discharge instruction sheet given to you by the facility where your surgery was performed. IF YOU HAVE DISABILITY OR FAMILY LEAVE FORMS, YOU MUST BRING THEM TO THE OFFICE FOR PROCESSING.   DO NOT GIVE THEM TO YOUR DOCTOR.  PAIN CONTROL  First take acetaminophen (Tylenol) AND/or ibuprofen (Advil) to control your pain after surgery.  Follow directions on package.  Taking acetaminophen (Tylenol) and/or ibuprofen (Advil) regularly after surgery will help to control your pain and lower the amount of prescription pain medication you may need.  You should not take more than 3,000 mg (3 grams) of acetaminophen (Tylenol) in 24 hours.  You should not take ibuprofen (Advil), aleve, motrin, naprosyn or other NSAIDS if you have a history of stomach ulcers or chronic kidney disease.  A prescription for pain medication may be given to you upon discharge.  Take your pain medication as prescribed, if you still have uncontrolled pain after taking acetaminophen (Tylenol) or ibuprofen (Advil). Use ice packs to help control pain. If you need a refill on your pain medication, please contact your pharmacy.  They will contact our office to request authorization. Prescriptions will not be filled after 5pm or on week-ends.  HOME MEDICATIONS Take your usually prescribed medications unless otherwise directed.  DIET You should follow a light diet the first few days after arrival home.  Be sure to include lots of fluids daily. Avoid fatty, fried foods.   CONSTIPATION It is common to experience some constipation after surgery and if you are taking pain medication.  Increasing fluid intake and taking a stool softener (such as Colace) will usually help or prevent this problem from occurring.  A mild laxative (Milk of Magnesia or Miralax) should be taken according to package instructions if there are no bowel movements after 48  hours.  WOUND/INCISION CARE Most patients will experience some swelling and bruising in the area of the incisions.  Ice packs will help.  Swelling and bruising can take several days to resolve.  Unless discharge instructions indicate otherwise, follow guidelines below  STERI-STRIPS - you may remove your outer bandages 48 hours after surgery, and you may shower at that time.  You have steri-strips (small skin tapes) in place directly over the incision.  These strips should be left on the skin for 7-10 days.   DERMABOND/SKIN GLUE - you may shower in 24 hours.  The glue will flake off over the next 2-3 weeks. Any sutures or staples will be removed at the office during your follow-up visit.  ACTIVITIES You may resume regular (light) daily activities beginning the next day--such as daily self-care, walking, climbing stairs--gradually increasing activities as tolerated.  You may have sexual intercourse when it is comfortable.  Refrain from any heavy lifting or straining until approved by your doctor. You may drive when you are no longer taking prescription pain medication, you can comfortably wear a seatbelt, and you can safely maneuver your car and apply brakes.  FOLLOW-UP You should see your doctor in the office for a follow-up appointment approximately 2-3 weeks after your surgery.  You should have been given your post-op/follow-up appointment when your surgery was scheduled.  If you did not receive a post-op/follow-up appointment, make sure that you call for this appointment within a day or two after you arrive home to insure a convenient appointment time.   WHEN TO CALL YOUR DOCTOR: Fever over 101.0 Inability to urinate Continued bleeding from incision.   Increased pain, redness, or drainage from the incision. Increasing abdominal pain  The clinic staff is available to answer your questions during regular business hours.  Please don't hesitate to call and ask to speak to one of the nurses for  clinical concerns.  If you have a medical emergency, go to the nearest emergency room or call 911.  A surgeon from [facility] is always on call at the hospital. [address], [address], [place], [place]  [address] ? [address] [address], [place], [place]   [address] [phone] ? [phone] ? FAX [phone] Web site: [email or web address]  

## 2021-07-12 NOTE — Telephone Encounter (Signed)
Patient aware to wait til next week for her next dose.

## 2021-07-13 ENCOUNTER — Encounter (HOSPITAL_BASED_OUTPATIENT_CLINIC_OR_DEPARTMENT_OTHER): Payer: Self-pay | Admitting: Obstetrics & Gynecology

## 2021-07-19 DIAGNOSIS — K819 Cholecystitis, unspecified: Secondary | ICD-10-CM | POA: Diagnosis not present

## 2021-07-20 ENCOUNTER — Other Ambulatory Visit: Payer: Self-pay

## 2021-07-20 ENCOUNTER — Encounter (HOSPITAL_BASED_OUTPATIENT_CLINIC_OR_DEPARTMENT_OTHER): Payer: Self-pay | Admitting: Obstetrics & Gynecology

## 2021-07-25 ENCOUNTER — Encounter (HOSPITAL_BASED_OUTPATIENT_CLINIC_OR_DEPARTMENT_OTHER)
Admission: RE | Disposition: A | Discharge status: Home / Self Care | Payer: Self-pay | Source: Home / Self Care | Attending: Obstetrics & Gynecology

## 2021-07-25 ENCOUNTER — Ambulatory Visit (HOSPITAL_BASED_OUTPATIENT_CLINIC_OR_DEPARTMENT_OTHER)
Admission: RE | Admit: 2021-07-25 | Discharge: 2021-07-25 | Disposition: A | Discharge status: Home / Self Care | Payer: BC Managed Care – PPO | Attending: Obstetrics & Gynecology | Admitting: Obstetrics & Gynecology

## 2021-07-25 ENCOUNTER — Ambulatory Visit (HOSPITAL_BASED_OUTPATIENT_CLINIC_OR_DEPARTMENT_OTHER): Payer: BC Managed Care – PPO | Admitting: Certified Registered"

## 2021-07-25 ENCOUNTER — Other Ambulatory Visit: Payer: Self-pay

## 2021-07-25 ENCOUNTER — Encounter (HOSPITAL_BASED_OUTPATIENT_CLINIC_OR_DEPARTMENT_OTHER): Payer: Self-pay | Admitting: Obstetrics & Gynecology

## 2021-07-25 DIAGNOSIS — N8500 Endometrial hyperplasia, unspecified: Secondary | ICD-10-CM | POA: Insufficient documentation

## 2021-07-25 DIAGNOSIS — I10 Essential (primary) hypertension: Secondary | ICD-10-CM | POA: Insufficient documentation

## 2021-07-25 DIAGNOSIS — N8501 Benign endometrial hyperplasia: Secondary | ICD-10-CM

## 2021-07-25 DIAGNOSIS — N926 Irregular menstruation, unspecified: Secondary | ICD-10-CM | POA: Diagnosis not present

## 2021-07-25 DIAGNOSIS — Z79899 Other long term (current) drug therapy: Secondary | ICD-10-CM | POA: Diagnosis not present

## 2021-07-25 DIAGNOSIS — F32A Depression, unspecified: Secondary | ICD-10-CM | POA: Diagnosis not present

## 2021-07-25 DIAGNOSIS — M797 Fibromyalgia: Secondary | ICD-10-CM | POA: Insufficient documentation

## 2021-07-25 DIAGNOSIS — Z833 Family history of diabetes mellitus: Secondary | ICD-10-CM | POA: Insufficient documentation

## 2021-07-25 DIAGNOSIS — E119 Type 2 diabetes mellitus without complications: Secondary | ICD-10-CM | POA: Insufficient documentation

## 2021-07-25 DIAGNOSIS — N938 Other specified abnormal uterine and vaginal bleeding: Secondary | ICD-10-CM

## 2021-07-25 DIAGNOSIS — E282 Polycystic ovarian syndrome: Secondary | ICD-10-CM | POA: Diagnosis not present

## 2021-07-25 DIAGNOSIS — Z8249 Family history of ischemic heart disease and other diseases of the circulatory system: Secondary | ICD-10-CM | POA: Diagnosis not present

## 2021-07-25 DIAGNOSIS — Z01818 Encounter for other preprocedural examination: Secondary | ICD-10-CM

## 2021-07-25 DIAGNOSIS — M1990 Unspecified osteoarthritis, unspecified site: Secondary | ICD-10-CM | POA: Diagnosis not present

## 2021-07-25 DIAGNOSIS — N939 Abnormal uterine and vaginal bleeding, unspecified: Secondary | ICD-10-CM | POA: Diagnosis not present

## 2021-07-25 HISTORY — PX: HYSTEROSCOPY WITH D & C: SHX1775

## 2021-07-25 LAB — CBC
HCT: 39.5 % (ref 36.0–46.0)
Hemoglobin: 14 g/dL (ref 12.0–15.0)
MCH: 30 pg (ref 26.0–34.0)
MCHC: 35.4 g/dL (ref 30.0–36.0)
MCV: 84.8 fL (ref 80.0–100.0)
Platelets: 212 10*3/uL (ref 150–400)
RBC: 4.66 MIL/uL (ref 3.87–5.11)
RDW: 12.7 % (ref 11.5–15.5)
WBC: 11 10*3/uL — ABNORMAL HIGH (ref 4.0–10.5)
nRBC: 0 % (ref 0.0–0.2)

## 2021-07-25 LAB — POCT PREGNANCY, URINE: Preg Test, Ur: NEGATIVE

## 2021-07-25 SURGERY — DILATATION AND CURETTAGE /HYSTEROSCOPY
Anesthesia: General | Site: Vagina

## 2021-07-25 MED ORDER — LIDOCAINE HCL (PF) 2 % IJ SOLN
INTRAMUSCULAR | Status: AC
  Filled 2021-07-25: qty 5

## 2021-07-25 MED ORDER — FENTANYL CITRATE (PF) 100 MCG/2ML IJ SOLN: 25.0000 ug | INTRAMUSCULAR | Status: DC | PRN

## 2021-07-25 MED ORDER — SODIUM CHLORIDE 0.9 % IR SOLN
Status: DC | PRN
  Administered 2021-07-25: 3000 mL

## 2021-07-25 MED ORDER — LACTATED RINGERS IV SOLN: INTRAVENOUS | Status: DC

## 2021-07-25 MED ORDER — ACETAMINOPHEN 500 MG PO TABS
ORAL_TABLET | ORAL | Status: AC
  Filled 2021-07-25: qty 2

## 2021-07-25 MED ORDER — DEXAMETHASONE SODIUM PHOSPHATE 10 MG/ML IJ SOLN
INTRAMUSCULAR | Status: AC
  Filled 2021-07-25: qty 1

## 2021-07-25 MED ORDER — DEXMEDETOMIDINE (PRECEDEX) IN NS 20 MCG/5ML (4 MCG/ML) IV SYRINGE
PREFILLED_SYRINGE | INTRAVENOUS | Status: AC
  Filled 2021-07-25: qty 5

## 2021-07-25 MED ORDER — ONDANSETRON HCL 4 MG/2ML IJ SOLN
INTRAMUSCULAR | Status: AC
  Filled 2021-07-25: qty 2

## 2021-07-25 MED ORDER — KETOROLAC TROMETHAMINE 30 MG/ML IJ SOLN
INTRAMUSCULAR | Status: AC
  Filled 2021-07-25: qty 1

## 2021-07-25 MED ORDER — FENTANYL CITRATE (PF) 100 MCG/2ML IJ SOLN
INTRAMUSCULAR | Status: AC
Start: 2021-07-25 — End: ?
  Filled 2021-07-25: qty 2

## 2021-07-25 MED ORDER — HYDROCODONE-ACETAMINOPHEN 5-325 MG PO TABS: 1.0000 | ORAL_TABLET | Freq: Four times a day (QID) | ORAL | 0 refills | Status: DC | PRN

## 2021-07-25 MED ORDER — LIDOCAINE 2% (20 MG/ML) 5 ML SYRINGE
INTRAMUSCULAR | Status: DC | PRN
  Administered 2021-07-25: 100 mg via INTRAVENOUS

## 2021-07-25 MED ORDER — FENTANYL CITRATE (PF) 100 MCG/2ML IJ SOLN
INTRAMUSCULAR | Status: DC | PRN
  Administered 2021-07-25 (×2): 50 ug via INTRAVENOUS

## 2021-07-25 MED ORDER — SILVER NITRATE-POT NITRATE 75-25 % EX MISC
CUTANEOUS | Status: DC | PRN
  Administered 2021-07-25: 2

## 2021-07-25 MED ORDER — DIPHENHYDRAMINE HCL 50 MG/ML IJ SOLN
INTRAMUSCULAR | Status: DC | PRN
  Administered 2021-07-25: 6.25 mg via INTRAVENOUS

## 2021-07-25 MED ORDER — ONDANSETRON HCL 4 MG/2ML IJ SOLN
INTRAMUSCULAR | Status: DC | PRN
  Administered 2021-07-25: 4 mg via INTRAVENOUS

## 2021-07-25 MED ORDER — DEXAMETHASONE SODIUM PHOSPHATE 10 MG/ML IJ SOLN
INTRAMUSCULAR | Status: DC | PRN
  Administered 2021-07-25: 10 mg via INTRAVENOUS

## 2021-07-25 MED ORDER — PROPOFOL 10 MG/ML IV BOLUS
INTRAVENOUS | Status: DC | PRN
  Administered 2021-07-25: 200 mg via INTRAVENOUS

## 2021-07-25 MED ORDER — LIDOCAINE-EPINEPHRINE 1 %-1:100000 IJ SOLN
INTRAMUSCULAR | Status: DC | PRN
  Administered 2021-07-25: 10 mL

## 2021-07-25 MED ORDER — DIPHENHYDRAMINE HCL 50 MG/ML IJ SOLN
INTRAMUSCULAR | Status: AC
  Filled 2021-07-25: qty 1

## 2021-07-25 MED ORDER — ACETAMINOPHEN 500 MG PO TABS
1000.0000 mg | ORAL_TABLET | Freq: Once | ORAL | Status: AC
  Administered 2021-07-25: 1000 mg via ORAL

## 2021-07-25 MED ORDER — MIDAZOLAM HCL 2 MG/2ML IJ SOLN
INTRAMUSCULAR | Status: AC
  Filled 2021-07-25: qty 2

## 2021-07-25 MED ORDER — PROPOFOL 10 MG/ML IV BOLUS
INTRAVENOUS | Status: AC
Start: 2021-07-25 — End: ?
  Filled 2021-07-25: qty 20

## 2021-07-25 MED ORDER — MIDAZOLAM HCL 5 MG/5ML IJ SOLN
INTRAMUSCULAR | Status: DC | PRN
  Administered 2021-07-25: 2 mg via INTRAVENOUS

## 2021-07-25 MED ORDER — NITROFURANTOIN MONOHYD MACRO 100 MG PO CAPS: 100.0000 mg | ORAL_CAPSULE | Freq: Two times a day (BID) | ORAL | 0 refills | Status: DC

## 2021-07-25 MED ORDER — KETOROLAC TROMETHAMINE 30 MG/ML IJ SOLN
INTRAMUSCULAR | Status: DC | PRN
  Administered 2021-07-25: 30 mg via INTRAVENOUS

## 2021-07-25 MED ORDER — POVIDONE-IODINE 10 % EX SWAB: 2.0000 "application " | Freq: Once | CUTANEOUS | Status: DC

## 2021-07-25 MED ORDER — PROPOFOL 10 MG/ML IV BOLUS
INTRAVENOUS | Status: AC
  Filled 2021-07-25: qty 20

## 2021-07-25 SURGICAL SUPPLY — 16 items
CATH ROBINSON RED A/P 16FR (CATHETERS) ×3 IMPLANT
DILATOR CANAL MILEX (MISCELLANEOUS) ×1 IMPLANT
DRSG TELFA 3X8 NADH (GAUZE/BANDAGES/DRESSINGS) ×2 IMPLANT
GLOVE BIOGEL PI IND STRL 7.0 (GLOVE) ×2 IMPLANT
GLOVE BIOGEL PI INDICATOR 7.0 (GLOVE) ×3
GLOVE ECLIPSE 6.5 STRL STRAW (GLOVE) ×6 IMPLANT
GOWN STRL REUS W/TWL LRG LVL3 (GOWN DISPOSABLE) ×6 IMPLANT
IV NS IRRIG 3000ML ARTHROMATIC (IV SOLUTION) ×3 IMPLANT
KIT PROCEDURE FLUENT (KITS) ×3 IMPLANT
KIT TURNOVER CYSTO (KITS) ×3 IMPLANT
PACK VAGINAL MINOR WOMEN LF (CUSTOM PROCEDURE TRAY) ×3 IMPLANT
PAD DRESSING TELFA 3X8 NADH (GAUZE/BANDAGES/DRESSINGS) ×2 IMPLANT
PAD OB MATERNITY 4.3X12.25 (PERSONAL CARE ITEMS) ×3 IMPLANT
SEAL ROD LENS SCOPE MYOSURE (ABLATOR) ×3 IMPLANT
SPIKE FLUID TRANSFER (MISCELLANEOUS) ×1 IMPLANT
TOWEL OR 17X26 10 PK STRL BLUE (TOWEL DISPOSABLE) ×3 IMPLANT

## 2021-07-25 NOTE — Anesthesia Preprocedure Evaluation (Addendum)
Anesthesia Evaluation  Patient identified by MRN, date of birth, ID band Patient awake    Reviewed: Allergy & Precautions, NPO status , Patient's Chart, lab work & pertinent test results  History of Anesthesia Complications (+) PONV and history of anesthetic complications  Airway Mallampati: II  TM Distance: >3 FB Neck ROM: Full    Dental no notable dental hx.    Pulmonary neg pulmonary ROS,    Pulmonary exam normal breath sounds clear to auscultation       Cardiovascular hypertension, Pt. on medications Normal cardiovascular exam Rhythm:Regular Rate:Normal     Neuro/Psych  Headaches, PSYCHIATRIC DISORDERS Depression    GI/Hepatic negative GI ROS, Neg liver ROS,   Endo/Other  diabetes, Well Controlled, Type 2PCOS  Renal/GU negative Renal ROS  negative genitourinary   Musculoskeletal  (+) Arthritis , Fibromyalgia -  Abdominal   Peds  Hematology negative hematology ROS (+)   Anesthesia Other Findings   Reproductive/Obstetrics                             Anesthesia Physical Anesthesia Plan  ASA: 3  Anesthesia Plan: General   Post-op Pain Management: Tylenol PO (pre-op)*   Induction: Intravenous  PONV Risk Score and Plan: 4 or greater and Ondansetron, Dexamethasone, Midazolam and TIVA  Airway Management Planned: LMA  Additional Equipment:   Intra-op Plan:   Post-operative Plan: Extubation in OR  Informed Consent: I have reviewed the patients History and Physical, chart, labs and discussed the procedure including the risks, benefits and alternatives for the proposed anesthesia with the patient or authorized representative who has indicated his/her understanding and acceptance.     Dental advisory given  Plan Discussed with: CRNA  Anesthesia Plan Comments:        Anesthesia Quick Evaluation

## 2021-07-25 NOTE — Op Note (Signed)
07/25/2021  8:18 AM  PATIENT:  Chelsea [name]  36 y.o. female  PRE-OPERATIVE DIAGNOSIS:  Endometrial Hyperplasia AUB  POST-OPERATIVE DIAGNOSIS:  Endometrial Hyperplasia AUB  PROCEDURE:  Procedure(s): DILATATION AND CURETTAGE /HYSTEROSCOPY  SURGEON:  Jerene Bears  ASSISTANTS: OR staff.   ANESTHESIA:   general  ESTIMATED BLOOD LOSS: 10 mL  BLOOD ADMINISTERED:none   FLUIDS: 600cc LR  UOP: pt voided before going back for procedure  SPECIMEN:  endometrial curetting  DISPOSITION OF SPECIMEN:  PATHOLOGY  FINDINGS: thickened endometrium but after D&C, no abnormal appearing lesions present  DESCRIPTION OF OPERATION: Patient was taken to the operating room.  She is placed in the supine position. SCDs were on her lower extremities and functioning properly. General anesthesia with an LMA was administered without difficulty. Dr. Armond Hang, anesthesia, oversaw case.  Time out performed  Legs were then placed in the [facility] stirrups in the low lithotomy position. The legs were lifted to the high lithotomy position and the Betadine prep was used on the inner thighs perineum and vagina x3. Patient was draped in a normal standard fashion. A bivalve speculum was placed the vagina. The anterior lip of the cervix was grasped with single-tooth tenaculum.  A paracervical block of 1% lidocaine mixed one-to-one with epinephrine (1:100,000 units).  10 cc was used total. The cervix is dilated up to #21 [facility] dilators. The endometrial cavity sounded to 8 cm.  Initially, a D&C was performed using a #1 toothed curette.  Tissue was collected for pathology.  Then using a 2.9 millimeter diagnostic hysteroscope was obtained. Normal saline was used as a hysteroscopic fluid. The hysteroscope was advanced through the endocervical canal into the endometrial cavity. The tubal ostia were noted bilaterally. No lesions or polyps noted.  The hysteroscope was removed.  A second curetting was then performed.  At this point  no other procedure was needed and this procedure was ended. The hysteroscope was removed. The fluid deficit was <100 cc. The tenaculum was removed from the anterior lip of the cervix. The speculum was removed from the vagina. The prep was cleansed of the patient's skin. The legs are positioned back in the supine position. Sponge, lap, needle, initially counts were correct x2. Patient was taken to recovery in stable condition.   COUNTS:  YES  PLAN OF CARE: Transfer to PACU

## 2021-07-25 NOTE — Anesthesia Procedure Notes (Signed)
Procedure Name: LMA Insertion Date/Time: 07/25/2021 7:35 AM Performed by: Marny Lowenstein, CRNA Pre-anesthesia Checklist: Patient identified, Emergency Drugs available, Suction available and Patient being monitored Patient Re-evaluated:Patient Re-evaluated prior to induction Oxygen Delivery Method: Circle system utilized Preoxygenation: Pre-oxygenation with 100% oxygen Induction Type: IV induction Ventilation: Mask ventilation without difficulty LMA: LMA inserted LMA Size: 4.0 Number of attempts: 1 Placement Confirmation: positive ETCO2 and breath sounds checked- equal and bilateral Tube secured with: Tape Dental Injury: Teeth and Oropharynx as per pre-operative assessment

## 2021-07-25 NOTE — Transfer of Care (Signed)
Immediate Anesthesia Transfer of Care Note  Patient: Chelsea [name]  Procedure(s) Performed: DILATATION AND CURETTAGE /HYSTEROSCOPY (Vagina )  Patient Location: PACU  Anesthesia Type:General  Level of Consciousness: drowsy and patient cooperative  Airway & Oxygen Therapy: Patient Spontanous Breathing and Patient connected to face mask oxygen  Post-op Assessment: Report given to RN and Post -op Vital signs reviewed and stable  Post vital signs: Reviewed and stable  Last Vitals:  Vitals Value Taken Time  BP 162/93 07/25/21 0803  Temp    Pulse 95 07/25/21 0803  Resp 15 07/25/21 0803  SpO2 97 % 07/25/21 0803  Vitals shown include unvalidated device data.  Last Pain:  Vitals:   07/25/21 0616  TempSrc: Oral  PainSc: 2       Patients Stated Pain Goal: 3 ([id number])  Complications: No notable events documented.

## 2021-07-25 NOTE — Discharge Instructions (Addendum)
Post Anesthesia Home Care Instructions  Activity: Get plenty of rest for the remainder of the day. A responsible adult should stay with you for 24 hours following the procedure.  For the next 24 hours, DO NOT: -Drive a car -Advertising copywriter -Drink alcoholic beverages -Take any medication unless instructed by your physician -Make any legal decisions or sign important papers.  Meals: Start with liquid foods such as gelatin or soup. Progress to regular foods as tolerated. Avoid greasy, spicy, heavy foods. If nausea and/or vomiting occur, drink only clear liquids until the nausea and/or vomiting subsides. Call your physician if vomiting continues.  Special Instructions/Symptoms: Your throat may feel dry or sore from the anesthesia or the breathing tube placed in your throat during surgery. If this causes discomfort, gargle with warm salt water. The discomfort should disappear within 24 hours.  If you had a scopolamine patch placed behind your ear for the management of post- operative nausea and/or vomiting:  1. The medication in the patch is effective for 72 hours, after which it should be removed.  Wrap patch in a tissue and discard in the trash. Wash hands thoroughly with soap and water. 2. You may remove the patch earlier than 72 hours if you experience unpleasant side effects which may include dry mouth, dizziness or visual disturbances. 3. Avoid touching the patch. Wash your hands with soap and water after contact with the patch.   Post-surgical Instructions, Outpatient Surgery  You may expect to feel dizzy, weak, and drowsy for as long as 24 hours after receiving the medicine that made you sleep (anesthetic). For the first 24 hours after your surgery:   Do not drive a car, ride a bicycle, participate in physical activities, or take public transportation until you are done taking narcotic pain medicines or as directed by Dr. Hyacinth Meeker.  Do not drink alcohol or take tranquilizers.  Do  not take medicine that has not been prescribed by your physicians.  Do not sign important papers or make important decisions while on narcotic pain medicines.  Have a responsible person with you.   PAIN MANAGEMENT You can use tylenol 500mg , two tabs every 6 hours as needed.  Do not take with the pain medication prescription as it has tylenol in it.  Use if you just need a little something for pain.    Vicodin 5/325mg .  For more severe pain, take one or two tablets every four to six hours as needed for pain control.  (Remember that narcotic pain medications increase your risk of constipation.  If this becomes a problem, you may take an over the counter stool softener like Colace 100mg  up to four times a day.)  Do not combine this with the pain medication you were given after your gall bladder surgery.  DO'S AND DON'T'S Do not take a tub bath for one week.  You may shower on the first day after your surgery Do not do any heavy lifting for one to two weeks.  This increases the chance of bleeding. Do move around as you feel able.  Stairs are fine.  You may begin to exercise again as you feel able.  Do not lift any weights for two weeks. Do not put anything in the vagina for two weeks--no tampons, intercourse, or douching.    REGULAR MEDIATIONS/VITAMINS: You may restart all of your regular medications as prescribed. You may restart all of your vitamins as you normally take them.    PLEASE CALL OR SEEK MEDICAL CARE IF:  You have persistent nausea and vomiting.  You have trouble eating or drinking.  You have an oral temperature above 100.5.  You have constipation that is not helped by adjusting diet or increasing fluid intake. Pain medicines are a common cause of constipation.  You have heavy vaginal bleeding    May have advil again after 2pm

## 2021-07-25 NOTE — H&P (Signed)
Chelsea [name] is an 36 y.o. female. G0 MWF with hx of irregular bleeding, thickened endometrium, simple hyperplasia wtihout atypia noted on endometrial biopsy here for D&C with hysteroscopy to ensure there is no additional significant pathology and to help control irregular bleeding.  She is hopeful to proceed with pregnancy in the future and will be working with Dr. April Manson on this.  Procedure, risks and benefits have been discussed.  All questions answered.  Pt here and ready to proceed.  Pertinent Gynecological History: Menses:  irregular Contraception:  same sex relationship DES exposure: denies Blood transfusions: none Sexually transmitted diseases: no past history Previous GYN Procedures:  hysteroscopy with D&C done 2016   Last mammogram: n/a Last pap: normal Date: 05/2019 with eng HR HPV OB History: G0, P0     Past Medical History:  Diagnosis Date   Depression    Endometrial polyp    Fibromyalgia    History of recurrent UTIs    Hypertension    Infertility, anovulation    Myofascial muscle pain    OA (osteoarthritis) of knee    BILATERAL   PCOS (polycystic ovarian syndrome)    PONV (postoperative nausea and vomiting)    Seasonal allergic rhinitis     Past Surgical History:  Procedure Laterality Date   CHOLECYSTECTOMY N/A 07/11/2021   Procedure: LAPAROSCOPIC CHOLECYSTECTOMY WITH INTRAOPERATIVE CHOLANGIOGRAM;  Surgeon: Manus Rudd, MD;  Location: [facility];  Service: General;  Laterality: N/A;   HYSTEROSCOPY WITH D & C N/A 09/07/2014   Procedure:  DILATATION AND CURETTAGE /HYSTEROSCOPY/POLYPECTOMY;  Surgeon: Fermin Schwab, MD;  Location: [facility];  Service: Gynecology;  Laterality: N/A;   I & D RIGHT FOOT  1998   LAPAROSCOPIC GASTRIC SLEEVE RESECTION N/A 10/20/2017   Procedure: LAPAROSCOPIC GASTRIC SLEEVE RESECTION, UPPER ENDO, ERAS Pathway;  Surgeon: Berna Bue, MD;  Location: [facility];  Service: General;  Laterality: N/A;   POLYPECTOMY      uterine   TONSILLECTOMY  2001   WISDOM TOOTH EXTRACTION  2004    Family History  Problem Relation Age of Onset   Diabetes Mother    Polycystic ovary syndrome Mother    Hypertension Father    Diabetes Father    Fibromyalgia Maternal Aunt    Fibromyalgia Cousin    Cancer Paternal Grandfather        prostate   Glaucoma Paternal Grandmother     Social History:  reports that she has never smoked. She has never used smokeless tobacco. She reports that she does not drink alcohol and does not use drugs.  Allergies:  Allergies  Allergen Reactions   Enalapril Maleate Swelling    lip swelling   Erythromycin Diarrhea    Severe yeast infection   Ceclor [Cefaclor] Hives    Caused yeast infection   Savella [Milnacipran] Hypertension   Amoxicillin Hives, Rash and Other (See Comments)    Caused yeast infection Has patient had a PCN reaction causing immediate rash, facial/tongue/throat swelling, SOB or lightheadedness with hypotension: Unknown Has patient had a PCN reaction causing severe rash involving mucus membranes or skin necrosis: No Has patient had a PCN reaction that required hospitalization: No Has patient had a PCN reaction occurring within the last 10 years: No Childhood allergy If all of the above answers are "NO", then may proceed with Cephalosporin use.    Penicillins Hives, Rash and Other (See Comments)    Caused yeast infection Has patient had a PCN reaction causing immediate rash, facial/tongue/throat swelling, SOB or  lightheadedness with hypotension: Unknown Has patient had a PCN reaction causing severe rash involving mucus membranes or skin necrosis: No Has patient had a PCN reaction that required hospitalization: No Has patient had a PCN reaction occurring within the last 10 years: No Childhood allergy If all of the above answers are "NO", then may proceed with Cephalosporin use.     Sulfa Antibiotics Hives and Rash    Per mom at bedside    Medications Prior to  Admission  Medication Sig Dispense Refill Last Dose   cyclobenzaprine (FLEXERIL) 5 MG tablet TAKE 1 OR 2 EVERY 8 HOURS AS NEEDED FOR MUSCLE RELAXANT. CAUTION: MAY CAUSE DROWSINESS. (Patient taking differently: Take 5-10 mg by mouth 3 (three) times daily as needed for muscle spasms. Caution: May cause drowsiness.) 90 tablet 0 Past Month   FLUoxetine (PROZAC) 20 MG capsule TAKE 1 CAPSULE BY MOUTH EVERY DAY (Patient taking differently: Take 20 mg by mouth daily.) 90 capsule 3 07/24/2021 at 2230   loratadine (CLARITIN) 10 MG tablet Take 10 mg by mouth daily.   07/24/2021 at 2230   Multiple Vitamins-Minerals (OPURITY PO) Take 2 capsules by mouth once. Bariatric vitamin. One capsule daily   07/24/2021 at 2230   NOVOFINE PLUS PEN NEEDLE 32G X 4 MM MISC USE WEEKLY FOR OZEMPIC INJECTIONS 100 each 11 Past Week   oxyCODONE (OXY IR/ROXICODONE) 5 MG immediate release tablet Take 1 tablet (5 mg total) by mouth every 6 (six) hours as needed for moderate pain (pain not relieved by tylenol). 15 tablet 0 Past Month   Semaglutide, 2 MG/DOSE, 8 MG/3ML SOPN Inject 2 mg as directed once a week. (Patient taking differently: Inject 2 mg as directed once a week. Wednesday) 3 mL 11 Past Week   topiramate (TOPAMAX) 100 MG tablet Take 100 mg by mouth 2 (two) times daily.   07/24/2021 at 2230   valsartan (DIOVAN) 160 MG tablet Take 0.5 tablets (80 mg total) by mouth daily.   07/24/2021 at 2230   ALPRAZolam (XANAX) 0.5 MG tablet Take 1 tablet (0.5 mg total) by mouth 3 (three) times daily as needed for anxiety. (Patient not taking: Reported on 07/11/2021) 20 tablet 0 Unknown    Review of Systems  All other systems reviewed and are negative.  Blood pressure (!) 140/93, pulse (!) 101, temperature (!) 97 F (36.1 C), temperature source Oral, resp. rate 18, height 5\' 9"  (1.753 m), weight 118.3 kg, SpO2 97 %. Physical Exam Constitutional:      Appearance: Normal appearance.  Cardiovascular:     Rate and Rhythm: Normal rate and regular  rhythm.  Pulmonary:     Effort: Pulmonary effort is normal.     Breath sounds: Normal breath sounds.  Neurological:     General: No focal deficit present.     Mental Status: She is alert and oriented to person, place, and time.  Psychiatric:        Mood and Affect: Mood normal.    Results for orders placed or performed during the hospital encounter of 07/25/21 (from the past 24 hour(s))  Pregnancy, urine POC     Status: None   Collection Time: 07/25/21  6:29 AM  Result Value Ref Range   Preg Test, Ur NEGATIVE NEGATIVE  CBC     Status: Abnormal   Collection Time: 07/25/21  6:38 AM  Result Value Ref Range   WBC 11.0 (H) 4.0 - 10.5 K/uL   RBC 4.66 3.87 - 5.11 MIL/uL   Hemoglobin 14.0 12.0 -  15.0 g/dL   HCT 16.139.5 09.636.0 - 04.546.0 %   MCV 84.8 80.0 - 100.0 fL   MCH 30.0 26.0 - 34.0 pg   MCHC 35.4 30.0 - 36.0 g/dL   RDW 40.912.7 81.111.5 - 91.415.5 %   Platelets 212 150 - 400 K/uL   nRBC 0.0 0.0 - 0.2 %    No results found.  Assessment/Plan: 36 yo G0 with simple endometrial hyperplasia here for hysteroscopy with dilation and curettage for additional evaluation and treatment of irregular bleeding.  Questions answered.  Pt ready to proceed.  Jerene BearsMary S Livy Ross 07/25/2021, 7:21 AM

## 2021-07-25 NOTE — Anesthesia Postprocedure Evaluation (Signed)
Anesthesia Post Note  Patient: VEENA STURGESS  Procedure(s) Performed: DILATATION AND CURETTAGE /HYSTEROSCOPY (Vagina )     Patient location during evaluation: PACU Anesthesia Type: General Level of consciousness: awake and alert Pain management: pain level controlled Vital Signs Assessment: post-procedure vital signs reviewed and stable Respiratory status: spontaneous breathing, nonlabored ventilation, respiratory function stable and patient connected to nasal cannula oxygen Cardiovascular status: blood pressure returned to baseline and stable Postop Assessment: no apparent nausea or vomiting Anesthetic complications: no   No notable events documented.  Last Vitals:  Vitals:   07/25/21 0815 07/25/21 0853  BP: (!) 151/92 127/79  Pulse: 82 81  Resp: 12 18  Temp:  37 C  SpO2: 95% 99%    Last Pain:  Vitals:   07/25/21 0853  TempSrc:   PainSc: 0-No pain                 Herminia Warren L Erie Sica

## 2021-07-26 ENCOUNTER — Encounter (HOSPITAL_BASED_OUTPATIENT_CLINIC_OR_DEPARTMENT_OTHER): Payer: Self-pay | Admitting: Obstetrics & Gynecology

## 2021-07-26 LAB — SURGICAL PATHOLOGY

## 2021-08-07 ENCOUNTER — Encounter (HOSPITAL_BASED_OUTPATIENT_CLINIC_OR_DEPARTMENT_OTHER): Payer: Self-pay | Admitting: Obstetrics & Gynecology

## 2021-08-08 DIAGNOSIS — N970 Female infertility associated with anovulation: Secondary | ICD-10-CM | POA: Diagnosis not present

## 2021-08-08 DIAGNOSIS — N912 Amenorrhea, unspecified: Secondary | ICD-10-CM | POA: Diagnosis not present

## 2021-08-08 DIAGNOSIS — E559 Vitamin D deficiency, unspecified: Secondary | ICD-10-CM | POA: Diagnosis not present

## 2021-08-08 DIAGNOSIS — N978 Female infertility of other origin: Secondary | ICD-10-CM | POA: Diagnosis not present

## 2021-08-08 DIAGNOSIS — E282 Polycystic ovarian syndrome: Secondary | ICD-10-CM | POA: Diagnosis not present

## 2021-08-11 ENCOUNTER — Other Ambulatory Visit: Payer: Self-pay | Admitting: Sports Medicine

## 2021-08-11 ENCOUNTER — Encounter (INDEPENDENT_AMBULATORY_CARE_PROVIDER_SITE_OTHER): Payer: BC Managed Care – PPO | Admitting: Sports Medicine

## 2021-08-11 DIAGNOSIS — I10 Essential (primary) hypertension: Secondary | ICD-10-CM

## 2021-08-11 DIAGNOSIS — E119 Type 2 diabetes mellitus without complications: Secondary | ICD-10-CM

## 2021-08-13 MED ORDER — LABETALOL HCL 100 MG PO TABS: 100.0000 mg | ORAL_TABLET | Freq: Two times a day (BID) | ORAL | 3 refills | Status: DC

## 2021-08-13 NOTE — Telephone Encounter (Signed)
I spent 5 total minutes of online digital evaluation and management services in this patient-initiated request for online care. 

## 2021-08-13 NOTE — Assessment & Plan Note (Signed)
Switching from Diovan to labetalol low-dose for obstetrical reasons, 2-week nurse visit blood pressure follow-up.

## 2021-08-15 ENCOUNTER — Encounter: Payer: Self-pay | Admitting: Sports Medicine

## 2021-08-15 ENCOUNTER — Other Ambulatory Visit: Payer: Self-pay

## 2021-08-15 DIAGNOSIS — E119 Type 2 diabetes mellitus without complications: Secondary | ICD-10-CM

## 2021-08-15 MED ORDER — SEMAGLUTIDE (2 MG/DOSE) 8 MG/3ML ~~LOC~~ SOPN: 2.0000 mg | PEN_INJECTOR | SUBCUTANEOUS | 3 refills | Status: DC

## 2021-08-20 ENCOUNTER — Encounter (HOSPITAL_BASED_OUTPATIENT_CLINIC_OR_DEPARTMENT_OTHER): Payer: Self-pay | Admitting: Obstetrics & Gynecology

## 2021-08-20 ENCOUNTER — Ambulatory Visit (INDEPENDENT_AMBULATORY_CARE_PROVIDER_SITE_OTHER): Payer: BC Managed Care – PPO | Admitting: Obstetrics & Gynecology

## 2021-08-20 VITALS — BP 124/85 | HR 77 | Ht 69.0 in | Wt 258.0 lb

## 2021-08-20 DIAGNOSIS — E2839 Other primary ovarian failure: Secondary | ICD-10-CM | POA: Diagnosis not present

## 2021-08-20 DIAGNOSIS — N970 Female infertility associated with anovulation: Secondary | ICD-10-CM | POA: Diagnosis not present

## 2021-08-20 DIAGNOSIS — N8501 Benign endometrial hyperplasia: Secondary | ICD-10-CM

## 2021-08-20 DIAGNOSIS — N8500 Endometrial hyperplasia, unspecified: Secondary | ICD-10-CM | POA: Diagnosis not present

## 2021-08-20 MED ORDER — NORETHINDRONE ACETATE 5 MG PO TABS: 5.0000 mg | ORAL_TABLET | Freq: Every day | ORAL | 11 refills | Status: DC

## 2021-08-22 ENCOUNTER — Other Ambulatory Visit: Payer: Self-pay | Admitting: Sports Medicine

## 2021-08-22 DIAGNOSIS — I10 Essential (primary) hypertension: Secondary | ICD-10-CM

## 2021-08-23 DIAGNOSIS — E2839 Other primary ovarian failure: Secondary | ICD-10-CM | POA: Diagnosis not present

## 2021-08-23 DIAGNOSIS — N978 Female infertility of other origin: Secondary | ICD-10-CM | POA: Diagnosis not present

## 2021-08-23 DIAGNOSIS — N970 Female infertility associated with anovulation: Secondary | ICD-10-CM | POA: Diagnosis not present

## 2021-08-23 DIAGNOSIS — N8500 Endometrial hyperplasia, unspecified: Secondary | ICD-10-CM | POA: Diagnosis not present

## 2021-08-23 DIAGNOSIS — E230 Hypopituitarism: Secondary | ICD-10-CM | POA: Diagnosis not present

## 2021-08-23 DIAGNOSIS — Z3141 Encounter for fertility testing: Secondary | ICD-10-CM | POA: Diagnosis not present

## 2021-08-29 DIAGNOSIS — E2839 Other primary ovarian failure: Secondary | ICD-10-CM | POA: Diagnosis not present

## 2021-08-29 DIAGNOSIS — N970 Female infertility associated with anovulation: Secondary | ICD-10-CM | POA: Diagnosis not present

## 2021-08-29 DIAGNOSIS — Z3141 Encounter for fertility testing: Secondary | ICD-10-CM | POA: Diagnosis not present

## 2021-09-06 ENCOUNTER — Ambulatory Visit: Payer: BC Managed Care – PPO | Admitting: Sports Medicine

## 2021-09-12 ENCOUNTER — Ambulatory Visit (INDEPENDENT_AMBULATORY_CARE_PROVIDER_SITE_OTHER): Payer: BC Managed Care – PPO | Admitting: Sports Medicine

## 2021-09-12 ENCOUNTER — Ambulatory Visit (INDEPENDENT_AMBULATORY_CARE_PROVIDER_SITE_OTHER): Payer: BC Managed Care – PPO

## 2021-09-12 DIAGNOSIS — M7541 Impingement syndrome of right shoulder: Secondary | ICD-10-CM

## 2021-09-12 DIAGNOSIS — I10 Essential (primary) hypertension: Secondary | ICD-10-CM | POA: Diagnosis not present

## 2021-09-12 DIAGNOSIS — M7542 Impingement syndrome of left shoulder: Secondary | ICD-10-CM | POA: Diagnosis not present

## 2021-09-12 DIAGNOSIS — M25511 Pain in right shoulder: Secondary | ICD-10-CM | POA: Diagnosis not present

## 2021-09-12 NOTE — Progress Notes (Signed)
    Procedures performed today:    None.  Independent interpretation of notes and tests performed by another provider:   None.  Brief History, Exam, Impression, and Recommendations:    Impingement syndrome of both shoulders Chelsea [name] returns, she is a pleasant 36 year old female, she has bilateral impingement syndrome, she has had bilateral injections, her right side was last injected sometime in March 2022, her left side was injected April 2022. It did look like she had a full-thickness retracted cuff tear on ultrasound on the left. She is having persistent symptoms in spite of doing home conditioning to some degree, she has had several injections, I think it is probably time to go ahead and get an MRI. Holding off on additional steroid injections for now, she is going through the IVF process and I am not sure what steroid injections will do to the delicate hormonal balance prior to an egg extraction. I would like her to also consult with Dr. Everardo Pacific in the meantime.  Essential hypertension, benign Well-controlled well with labetalol, she is planning on getting pregnant.    ____________________________________________ [name]. Benjamin Stain, M.D., ABFM., CAQSM., AME. Primary Care and Sports Medicine  [facility]  Adjunct Professor of Family Medicine  [employer] [facility] [employer]  Restaurant manager, fast food

## 2021-09-12 NOTE — Assessment & Plan Note (Signed)
Chelsea [name] returns, she is a pleasant 36 year old female, she has bilateral impingement syndrome, she has had bilateral injections, her right side was last injected sometime in March 2022, her left side was injected April 2022. It did look like she had a full-thickness retracted cuff tear on ultrasound on the left. She is having persistent symptoms in spite of doing home conditioning to some degree, she has had several injections, I think it is probably time to go ahead and get an MRI. Holding off on additional steroid injections for now, she is going through the IVF process and I am not sure what steroid injections will do to the delicate hormonal balance prior to an egg extraction. I would like her to also consult with Dr. Everardo Pacific in the meantime.

## 2021-09-12 NOTE — Assessment & Plan Note (Signed)
Well-controlled well with labetalol, she is planning on getting pregnant.

## 2021-09-13 ENCOUNTER — Encounter: Payer: Self-pay | Admitting: Sports Medicine

## 2021-09-13 ENCOUNTER — Other Ambulatory Visit: Payer: Self-pay | Admitting: Sports Medicine

## 2021-09-13 DIAGNOSIS — E119 Type 2 diabetes mellitus without complications: Secondary | ICD-10-CM

## 2021-09-16 ENCOUNTER — Ambulatory Visit (INDEPENDENT_AMBULATORY_CARE_PROVIDER_SITE_OTHER): Payer: BC Managed Care – PPO

## 2021-09-16 DIAGNOSIS — M7541 Impingement syndrome of right shoulder: Secondary | ICD-10-CM | POA: Diagnosis not present

## 2021-09-16 DIAGNOSIS — M19011 Primary osteoarthritis, right shoulder: Secondary | ICD-10-CM | POA: Diagnosis not present

## 2021-09-17 ENCOUNTER — Encounter: Payer: Self-pay | Admitting: Sports Medicine

## 2021-09-18 DIAGNOSIS — N840 Polyp of corpus uteri: Secondary | ICD-10-CM | POA: Diagnosis not present

## 2021-09-18 DIAGNOSIS — N858 Other specified noninflammatory disorders of uterus: Secondary | ICD-10-CM | POA: Diagnosis not present

## 2021-09-18 DIAGNOSIS — D250 Submucous leiomyoma of uterus: Secondary | ICD-10-CM | POA: Diagnosis not present

## 2021-09-19 ENCOUNTER — Encounter: Payer: Self-pay | Admitting: Sports Medicine

## 2021-09-25 DIAGNOSIS — M25511 Pain in right shoulder: Secondary | ICD-10-CM | POA: Diagnosis not present

## 2021-09-28 ENCOUNTER — Ambulatory Visit (INDEPENDENT_AMBULATORY_CARE_PROVIDER_SITE_OTHER): Payer: BC Managed Care – PPO | Admitting: Sports Medicine

## 2021-09-28 ENCOUNTER — Encounter: Payer: Self-pay | Admitting: Sports Medicine

## 2021-09-28 ENCOUNTER — Telehealth: Payer: Self-pay

## 2021-09-28 DIAGNOSIS — M7542 Impingement syndrome of left shoulder: Secondary | ICD-10-CM

## 2021-09-28 DIAGNOSIS — I10 Essential (primary) hypertension: Secondary | ICD-10-CM | POA: Diagnosis not present

## 2021-09-28 DIAGNOSIS — E119 Type 2 diabetes mellitus without complications: Secondary | ICD-10-CM

## 2021-09-28 DIAGNOSIS — M7541 Impingement syndrome of right shoulder: Secondary | ICD-10-CM | POA: Diagnosis not present

## 2021-09-28 MED ORDER — RYBELSUS 7 MG PO TABS: 7.0000 mg | ORAL_TABLET | Freq: Every day | ORAL | 0 refills | Status: DC

## 2021-09-28 NOTE — Assessment & Plan Note (Signed)
Currently on labetalol twice a day, as she continues to lose weight she is starting to get somewhat orthostatic on her current dose of labetalol, we will drop it in half.

## 2021-09-28 NOTE — Assessment & Plan Note (Signed)
Excellent improvements in weight, blood sugar control with Ozempic, unfortunately it is on backorder everywhere. We are going to try Rybelsus, she is on max dose Ozempic so we will start with Rybelsus 7 for a month and do 14 afterwards. She will need to stop it during pregnancy.

## 2021-09-28 NOTE — Assessment & Plan Note (Signed)
Chelsea [name] has bilateral shoulder impingement syndrome, she has had multiple injections, right side last injected March 2022, left side injected April 2022. She is scheduled for shoulder arthroscopy on the right with Dr. Everardo Pacific. Considering her current location and the IVF process and not being sure as to what steroids would do with a delicate hormonal balance prior to egg extractions and embryo implantation we have avoided steroids perioperatively.

## 2021-09-28 NOTE — Telephone Encounter (Signed)
Initiated Prior authorization CBS:[id number] 7MG  tablets Via: Covermymeds Case/Key:[id number] Status: approved  as of 09/28/21 Reason:Effective from 09/28/2021 through 09/27/2022.   Notified Pt via: Mychart

## 2021-09-28 NOTE — Progress Notes (Signed)
    Procedures performed today:    None.  Independent interpretation of notes and tests performed by another provider:   None.  Brief History, Exam, Impression, and Recommendations:    Diet-controlled diabetes mellitus (HCC) Excellent improvements in weight, blood sugar control with Ozempic, unfortunately it is on backorder everywhere. We are going to try Rybelsus, she is on max dose Ozempic so we will start with Rybelsus 7 for a month and do 14 afterwards. She will need to stop it during pregnancy.  Essential hypertension, benign Currently on labetalol twice a day, as she continues to lose weight she is starting to get somewhat orthostatic on her current dose of labetalol, we will drop it in half.  Impingement syndrome of both shoulders Chelsea [name] has bilateral shoulder impingement syndrome, she has had multiple injections, right side last injected March 2022, left side injected April 2022. She is scheduled for shoulder arthroscopy on the right with Dr. Everardo Pacific. Considering her current location and the IVF process and not being sure as to what steroids would do with a delicate hormonal balance prior to egg extractions and embryo implantation we have avoided steroids perioperatively.  Chronic process not at goal with pharmacologic intervention  ____________________________________________ [name]. Benjamin Stain, M.D., ABFM., CAQSM., AME. Primary Care and Sports Medicine [place] [facility]  Adjunct Professor of Family Medicine  [employer] [facility] [employer] [facility] [employer]  Restaurant manager, fast food

## 2021-10-01 DIAGNOSIS — M24111 Other articular cartilage disorders, right shoulder: Secondary | ICD-10-CM | POA: Diagnosis not present

## 2021-10-01 DIAGNOSIS — G8918 Other acute postprocedural pain: Secondary | ICD-10-CM | POA: Diagnosis not present

## 2021-10-01 DIAGNOSIS — Y999 Unspecified external cause status: Secondary | ICD-10-CM | POA: Diagnosis not present

## 2021-10-01 DIAGNOSIS — X58XXXA Exposure to other specified factors, initial encounter: Secondary | ICD-10-CM | POA: Diagnosis not present

## 2021-10-01 DIAGNOSIS — S43431A Superior glenoid labrum lesion of right shoulder, initial encounter: Secondary | ICD-10-CM | POA: Diagnosis not present

## 2021-10-01 DIAGNOSIS — M7521 Bicipital tendinitis, right shoulder: Secondary | ICD-10-CM | POA: Diagnosis not present

## 2021-10-01 DIAGNOSIS — M7541 Impingement syndrome of right shoulder: Secondary | ICD-10-CM | POA: Diagnosis not present

## 2021-10-01 DIAGNOSIS — M19011 Primary osteoarthritis, right shoulder: Secondary | ICD-10-CM | POA: Diagnosis not present

## 2021-10-04 ENCOUNTER — Ambulatory Visit
Payer: BC Managed Care – PPO | Attending: Orthopaedic Surgery | Admitting: Rehabilitative and Restorative Service Providers"

## 2021-10-04 ENCOUNTER — Other Ambulatory Visit: Payer: Self-pay

## 2021-10-04 DIAGNOSIS — M25511 Pain in right shoulder: Secondary | ICD-10-CM | POA: Diagnosis not present

## 2021-10-04 DIAGNOSIS — G2589 Other specified extrapyramidal and movement disorders: Secondary | ICD-10-CM | POA: Insufficient documentation

## 2021-10-04 DIAGNOSIS — R293 Abnormal posture: Secondary | ICD-10-CM | POA: Insufficient documentation

## 2021-10-04 DIAGNOSIS — R29898 Other symptoms and signs involving the musculoskeletal system: Secondary | ICD-10-CM | POA: Diagnosis not present

## 2021-10-04 NOTE — Therapy (Signed)
OUTPATIENT PHYSICAL THERAPY SHOULDER EVALUATION   Patient Name: Chelsea [name] MRN: [id number] DOB:[birth year], 36 y.o., female Today's Date: 10/04/2021   PT End of Session - 10/04/21 1316     Visit Number 1    Number of Visits 12    Date for PT Re-Evaluation 11/15/21             Past Medical History:  Diagnosis Date   Depression    Endometrial polyp    Fibromyalgia    History of recurrent UTIs    Hypertension    Infertility, anovulation    Myofascial muscle pain    OA (osteoarthritis) of knee    BILATERAL   PCOS (polycystic ovarian syndrome)    PONV (postoperative nausea and vomiting)    Seasonal allergic rhinitis    Past Surgical History:  Procedure Laterality Date   CHOLECYSTECTOMY N/A 07/11/2021   Procedure: LAPAROSCOPIC CHOLECYSTECTOMY WITH INTRAOPERATIVE CHOLANGIOGRAM;  Surgeon: Manus Rudd, MD;  Location: [facility];  Service: General;  Laterality: N/A;   HYSTEROSCOPY WITH D & C N/A 09/07/2014   Procedure:  DILATATION AND CURETTAGE /HYSTEROSCOPY/POLYPECTOMY;  Surgeon: Fermin Schwab, MD;  Location:  [facility];  Service: Gynecology;  Laterality: N/A;   HYSTEROSCOPY WITH D & C N/A 07/25/2021   Procedure: DILATATION AND CURETTAGE /HYSTEROSCOPY;  Surgeon: Jerene Bears, MD;  Location: [facility] ;  Service: Gynecology;  Laterality: N/A;   I & D RIGHT FOOT  1998   LAPAROSCOPIC GASTRIC SLEEVE RESECTION N/A 10/20/2017   Procedure: LAPAROSCOPIC GASTRIC SLEEVE RESECTION, UPPER ENDO, ERAS Pathway;  Surgeon: Berna Bue, MD;  Location: [facility];  Service: General;  Laterality: N/A;   POLYPECTOMY     uterine   TONSILLECTOMY  2001   WISDOM TOOTH EXTRACTION  2004   Patient Active Problem List   Diagnosis Date Noted   Simple endometrial hyperplasia    Acute calculous cholecystitis 07/11/2021   Acute cholecystitis due to biliary calculus 07/11/2021   Right ankle sprain 07/19/2020   Chronic pain syndrome 05/30/2020   Impingement  syndrome of both shoulders 08/17/2019   Primary insomnia 05/11/2019   Paronychia of second toe, left 01/06/2019   Mixed hypercholesterolemia and hypertriglyceridemia 11/13/2018   Orgasmic headache 11/11/2018   Diet-controlled diabetes mellitus (HCC) 04/25/2017   Low back pain 08/24/2015   Intractable migraine without aura and with status migrainosus 06/24/2014   Myofascial pain syndrome with depression 02/08/2014   Venous stasis dermatitis 01/25/2014   Right hip pain 12/13/2013   Metatarsalgia of both feet 10/18/2013   Right knee pain 02/13/2012   Essential hypertension, benign 11/25/2008   POLYCYSTIC OVARIAN DISEASE 10/26/2008   History of gastric sleeve 10/26/2008    PCP: Dr Rodney Langton  REFERRING PROVIDER: Dr Ramond Marrow   REFERRING DIAG: Arthroscopy Rt shoulder    THERAPY DIAG:  Acute pain of right shoulder  Other symptoms and signs involving the musculoskeletal system  Abnormal posture  Scapular dyskinesis  Rationale for Evaluation and Treatment Rehabilitation  ONSET DATE: 10/01/21  SUBJECTIVE:  SUBJECTIVE STATEMENT: Patient reports that she has had ongoing bilat shoulder pain worsening in the past couple years. She had arthroscopic surgery 10/01/21 for debridement of Rt shoulder.   PERTINENT HISTORY: History of recurrent bilat shoulder pain for several years with injections Rt shoulder 3/22 and Lt 4/22. History of fibromyalgia; HTN  PAIN:  Are you having pain? Yes: NPRS scale: 4/10 Pain location: shoulder  Pain description: pulling and tightness  Aggravating factors: moving arm Relieving factors: meds; ice  PRECAUTIONS: None  FALLS:  Has patient fallen in last 6 months? No  LIVING ENVIRONMENT: Lives with: parents  Lives in: House/apartment Has following equipment at home:  shower chair, Shower bench, and Grab bars  OCCUPATION: Not working due to fibromyalgia pain   PLOF: Independent  PATIENT GOALS be able to use arm again and not hurt as much in the shoulder; wash her own hair   OBJECTIVE:   DIAGNOSTIC FINDINGS:  09/16/21 MRI Rt shoulder: 1.  Mild supraspinatus tendinosis without discrete tear.2.  Mild acromioclavicular osteoarthritis.  3.  No evidence of rotator cuff muscle atrophy.4. No glenohumeral joint effusion or significant arthropathy on this non-arthrographic examination.  PATIENT SURVEYS:  FOTO 4  COGNITION:  Overall cognitive status: Within functional limits for tasks assessed     SENSATION: WFL  POSTURE: Head forward; shoulders rounded and elevated; head of the humerus anterior in orientation;scapulae abducted and rotated along the thoracic wall; significant increase in thoracic kyphosis; Rt UE supported in sling   UPPER EXTREMITY ROM:  Rt UE not tested; Lt UE WFLs' limited end ranges shoulder elevation   Active ROM Right eval Left eval  Shoulder flexion  50  Shoulder extension  155  Shoulder abduction  156  Shoulder adduction  90 at 90 deg shd abd/elbow 90 deg flex  Shoulder internal rotation    Shoulder external rotation    Elbow flexion    Elbow extension    Wrist flexion    Wrist extension    Wrist ulnar deviation    Wrist radial deviation    Wrist pronation    Wrist supination    (Blank rows = not tested)  UPPER EXTREMITY MMT: not tested; Lt UE WFL's   MMT Right eval Left eval  Shoulder flexion    Shoulder extension    Shoulder abduction    Shoulder adduction    Shoulder internal rotation    Shoulder external rotation    Middle trapezius    Lower trapezius    Elbow flexion    Elbow extension    Wrist flexion    Wrist extension    Wrist ulnar deviation    Wrist radial deviation    Wrist pronation    Wrist supination    Grip strength (lbs)    (Blank rows = not tested)   PALPATION:  Muscular  tightness noted Rt > Lt pecs; biceps; upper trap; leveator; teres   TODAY'S TREATMENT:           Post op dressing removed (pt and mom stated the dressing was to be removed today) scope sites dry and intact with minimal dried blood on steri strips. Pt and mom advised to follow post op instructions for care of incisions.   Access Code: [email or web address] URL: [email or web address] Date: 10/04/2021 Prepared by: Corlis Leak  Exercises - Seated Scapular Retraction  - 2 x daily - 7 x weekly - 1-2 sets - 10 reps - 10 sec  hold - Circular Shoulder Pendulum with Table Support  - 3-4 x  daily - 7 x weekly - 1 sets - 20-30 reps - Standing Shoulder and Trunk Flexion at Table  - 2 x daily - 7 x weekly - 1 sets - 5 reps - 5 sec  hold - Seated Shoulder Flexion Towel Slide at Table Top Full Range of Motion  - 2 x daily - 7 x weekly - 1 sets - 5-10 reps - 10sec  hold - Seated Shoulder External Rotation AAROM with Cane and Hand in Neutral  - 2 x daily - 7 x weekly - 1 sets - 5-10 reps - 5-10 sec  hold - Standing Backward Shoulder Rolls  - 2 x daily - 7 x weekly - 1 sets - 10 reps - 1-2 sec  hold - Standing Shoulder Extension with Dowel  - 2 x daily - 7 x weekly - 1 sets - 5-10 reps - 5-10 sec  hold - Supine Diaphragmatic Breathing  - 2 x daily - 7 x weekly - 1 sets - 10 reps - 4-6 sec  hold - AROM Rt elbow, wrist, hand   PATIENT EDUCATION: Education details: POC; HEP; ice for pain management; postural correction Person educated: Patient Education method: Explanation, Demonstration, Tactile cues, Verbal cues, and Handouts Education comprehension: verbalized understanding, returned demonstration, verbal cues required, tactile cues required, and needs further education   HOME EXERCISE PROGRAM: [email or web address]  ASSESSMENT:  CLINICAL IMPRESSION: Patient is a 36 y.o. female who was seen today for physical therapy evaluation and treatment for bilat shoulder pain of a recurrent nature now s/p Rt shoulder  arthroscopy for debridement. Patient has a history of bilat shoulder pain over the past several years with symptoms treated with medications and injections. She has a history of fibromyalgia and chronic back and neck pain. Patient is sedentary, not working at this time. She presents with Rt UE in sling with post op dressing. She has poor posture and alignment; limited ROM/mobility Rt UE; generalized weakness; decreased functional activity level; pain. Patient will benefit from PT to address problems identified.    OBJECTIVE IMPAIRMENTS decreased activity tolerance, decreased mobility, decreased ROM, decreased strength, impaired flexibility, impaired UE functional use, improper body mechanics, postural dysfunction, obesity, and pain.   ACTIVITY LIMITATIONS carrying and lifting  PARTICIPATION LIMITATIONS: occupation and ADL's  PERSONAL FACTORS Fitness, Past/current experiences, and Time since onset of injury/illness/exacerbation are also affecting patient's functional outcome.   REHAB POTENTIAL: Good  CLINICAL DECISION MAKING: Stable/uncomplicated  EVALUATION COMPLEXITY: Low   GOALS: Goals reviewed with patient? Yes   LONG TERM GOALS: Target date: 11/15/2021  (Remove Blue Hyperlink)  Decrease pain in bilat UE's by 50-75% allowing patient to return to all normal functional activities  Baseline:  Goal status: INITIAL  2.  Improve posture and alignment with patient to demonstrate improved upright posture with posterior shoulder girdle musculature engaged  Baseline:  Goal status: INITIAL  3.  4/5 to 5/5 strength Rt UE  Baseline:  Goal status: INITIAL  4.  AROM Rt shoulder to =/> than AROM Lt shoulder  Baseline:  Goal status: INITIAL  5.  Independent in HEP (including aquatic program as indicated) Baseline:  Goal status: INITIAL  6.  Improve functional limitation score to  Baseline:  Goal status: INITIAL   PLAN: PT FREQUENCY: 2x/week  PT DURATION: 6 weeks  PLANNED  INTERVENTIONS: Therapeutic exercises, Therapeutic activity, Neuromuscular re-education, Patient/Family education, Self Care, Joint mobilization, Aquatic Therapy, Dry Needling, Electrical stimulation, Cryotherapy, Moist heat, Taping, Vasopneumatic device, Ultrasound, Manual therapy, and Re-evaluation  PLAN FOR NEXT  SESSION: review HEP; progress with AAROM to AROM all planes as patient tolerates; postural strengthening; manual work and modalities as indicated    Trini Soldo Rober Minion, PT, MPH  10/04/2021, 2:46 PM

## 2021-10-08 ENCOUNTER — Encounter: Payer: Self-pay | Admitting: Rehabilitative and Restorative Service Providers"

## 2021-10-08 ENCOUNTER — Other Ambulatory Visit: Payer: Self-pay

## 2021-10-08 ENCOUNTER — Ambulatory Visit: Payer: BC Managed Care – PPO | Admitting: Rehabilitative and Restorative Service Providers"

## 2021-10-08 DIAGNOSIS — R293 Abnormal posture: Secondary | ICD-10-CM

## 2021-10-08 DIAGNOSIS — G2589 Other specified extrapyramidal and movement disorders: Secondary | ICD-10-CM

## 2021-10-08 DIAGNOSIS — R29898 Other symptoms and signs involving the musculoskeletal system: Secondary | ICD-10-CM

## 2021-10-08 DIAGNOSIS — M25511 Pain in right shoulder: Secondary | ICD-10-CM

## 2021-10-08 NOTE — Therapy (Signed)
OUTPATIENT PHYSICAL THERAPY SHOULDER EVALUATION   Patient Name: Chelsea [name] MRN: [id number] DOB:Jul 05, 1985, 36 y.o., female Today's Date: 10/08/2021   PT End of Session - 10/08/21 1317     Visit Number 2    Number of Visits 12    Date for PT Re-Evaluation 11/15/21    PT Start Time 1315    PT Stop Time 1345    PT Time Calculation (min) 30 min    Activity Tolerance Patient tolerated treatment well             Past Medical History:  Diagnosis Date   Depression    Endometrial polyp    Fibromyalgia    History of recurrent UTIs    Hypertension    Infertility, anovulation    Myofascial muscle pain    OA (osteoarthritis) of knee    BILATERAL   PCOS (polycystic ovarian syndrome)    PONV (postoperative nausea and vomiting)    Seasonal allergic rhinitis    Past Surgical History:  Procedure Laterality Date   CHOLECYSTECTOMY N/A 07/11/2021   Procedure: LAPAROSCOPIC CHOLECYSTECTOMY WITH INTRAOPERATIVE CHOLANGIOGRAM;  Surgeon: Manus Rudd, MD;  Location: [facility];  Service: General;  Laterality: N/A;   HYSTEROSCOPY WITH D & C N/A 09/07/2014   Procedure:  DILATATION AND CURETTAGE /HYSTEROSCOPY/POLYPECTOMY;  Surgeon: Fermin Schwab, MD;  Location: [facility];  Service: Gynecology;  Laterality: N/A;   HYSTEROSCOPY WITH D & C N/A 07/25/2021   Procedure: DILATATION AND CURETTAGE /HYSTEROSCOPY;  Surgeon: Jerene Bears, MD;  Location: [facility];  Service: Gynecology;  Laterality: N/A;   I & D RIGHT FOOT  1998   LAPAROSCOPIC GASTRIC SLEEVE RESECTION N/A 10/20/2017   Procedure: LAPAROSCOPIC GASTRIC SLEEVE RESECTION, UPPER ENDO, ERAS Pathway;  Surgeon: Berna Bue, MD;  Location: [facility];  Service: General;  Laterality: N/A;   POLYPECTOMY     uterine   TONSILLECTOMY  2001   WISDOM TOOTH EXTRACTION  2004   Patient Active Problem List   Diagnosis Date Noted   Simple endometrial hyperplasia    Acute calculous cholecystitis 07/11/2021   Acute  cholecystitis due to biliary calculus 07/11/2021   Right ankle sprain 07/19/2020   Chronic pain syndrome 05/30/2020   Impingement syndrome of both shoulders 08/17/2019   Primary insomnia 05/11/2019   Paronychia of second toe, left 01/06/2019   Mixed hypercholesterolemia and hypertriglyceridemia 11/13/2018   Orgasmic headache 11/11/2018   Diet-controlled diabetes mellitus (HCC) 04/25/2017   Low back pain 08/24/2015   Intractable migraine without aura and with status migrainosus 06/24/2014   Myofascial pain syndrome with depression 02/08/2014   Venous stasis dermatitis 01/25/2014   Right hip pain 12/13/2013   Metatarsalgia of both feet 10/18/2013   Right knee pain 02/13/2012   Essential hypertension, benign 11/25/2008   POLYCYSTIC OVARIAN DISEASE 10/26/2008   History of gastric sleeve 10/26/2008    PCP: Dr Rodney Langton  REFERRING PROVIDER: Dr Ramond Marrow   REFERRING DIAG: Arthroscopy Rt shoulder    THERAPY DIAG:  Acute pain of right shoulder  Other symptoms and signs involving the musculoskeletal system  Abnormal posture  Scapular dyskinesis  Rationale for Evaluation and Treatment Rehabilitation  ONSET DATE: 10/01/21  SUBJECTIVE:              10/08/21: Patient reports that she continues to have some weakness in the Rt shoulder and some discomfort and pulling in the shoulder. She is sleeping with the sling but is not in the sling otherwise. She is  working on her exercises at least once a day.                                                                                                                                                                                     SUBJECTIVE STATEMENT: Evaluation: Patient reports that she has had ongoing bilat shoulder pain worsening in the past couple years. She had arthroscopic surgery 10/01/21 for debridement of Rt shoulder.   PERTINENT HISTORY: History of recurrent bilat shoulder pain for several years with injections Rt shoulder  3/22 and Lt 4/22. History of fibromyalgia; HTN  PAIN:  Are you having pain? Yes: NPRS scale: 3/10 Pain location: shoulder  Pain description: pulling and tightness  Aggravating factors: moving arm Relieving factors: meds; ice  PRECAUTIONS: None  FALLS:  Has patient fallen in last 6 months? No  LIVING ENVIRONMENT: Lives with: parents  Lives in: House/apartment Has following equipment at home: shower chair, Shower bench, and Grab bars  OCCUPATION: Not working due to fibromyalgia pain   PLOF: Independent  PATIENT GOALS be able to use arm again and not hurt as much in the shoulder; wash her own hair   OBJECTIVE:   DIAGNOSTIC FINDINGS:  09/16/21 MRI Rt shoulder: 1.  Mild supraspinatus tendinosis without discrete tear.2.  Mild acromioclavicular osteoarthritis.  3.  No evidence of rotator cuff muscle atrophy.4. No glenohumeral joint effusion or significant arthropathy on this non-arthrographic examination.  PATIENT SURVEYS:  FOTO 4  COGNITION:  Overall cognitive status: Within functional limits for tasks assessed     SENSATION: WFL  POSTURE: Head forward; shoulders rounded and elevated; head of the humerus anterior in orientation;scapulae abducted and rotated along the thoracic wall; significant increase in thoracic kyphosis; Rt UE supported in sling   UPPER EXTREMITY ROM:  Rt UE not tested; Lt UE WFLs' limited end ranges shoulder elevation   Active ROM Right eval Left eval  Shoulder flexion  50  Shoulder extension  155  Shoulder abduction  156  Shoulder adduction  90 at 90 deg shd abd/elbow 90 deg flex  Shoulder internal rotation    Shoulder external rotation    Elbow flexion    Elbow extension    Wrist flexion    Wrist extension    Wrist ulnar deviation    Wrist radial deviation    Wrist pronation    Wrist supination    (Blank rows = not tested)  UPPER EXTREMITY MMT: not tested; Lt UE WFL's   MMT Right eval Left eval  Shoulder flexion    Shoulder  extension    Shoulder abduction    Shoulder adduction    Shoulder internal rotation  Shoulder external rotation    Middle trapezius    Lower trapezius    Elbow flexion    Elbow extension    Wrist flexion    Wrist extension    Wrist ulnar deviation    Wrist radial deviation    Wrist pronation    Wrist supination    Grip strength (lbs)    (Blank rows = not tested)   PALPATION:  Muscular tightness noted Rt > Lt pecs; biceps; upper trap; leveator; teres   TODAY'S TREATMENT:           Therapeutic exercises; PROM and stretching by PT.  Scap squeeze 10 sec x 10 noodle along spine  ER with cane 10 sec x 5 reps  Extension with cane 5 sec x 5 reps      PATIENT EDUCATION: Education details: POC; HEP; ice for pain management; postural correction Person educated: Patient Education method: Explanation, Demonstration, Tactile cues, Verbal cues, and Handouts Education comprehension: verbalized understanding, returned demonstration, verbal cues required, tactile cues required, and needs further education   HOME EXERCISE PROGRAM: Access Code: [email or web address] URL: [email or web address] Date: 10/08/2021 Prepared by: Corlis Leak  Exercises - Seated Scapular Retraction  - 2 x daily - 7 x weekly - 1-2 sets - 10 reps - 10 sec  hold - Circular Shoulder Pendulum with Table Support  - 3-4 x daily - 7 x weekly - 1 sets - 20-30 reps - Standing Shoulder and Trunk Flexion at Table  - 2 x daily - 7 x weekly - 1 sets - 5 reps - 5 sec  hold - Seated Shoulder Flexion Towel Slide at Table Top Full Range of Motion  - 2 x daily - 7 x weekly - 1 sets - 5-10 reps - 10sec  hold - Seated Shoulder External Rotation AAROM with Cane and Hand in Neutral  - 2 x daily - 7 x weekly - 1 sets - 5-10 reps - 5-10 sec  hold - Standing Backward Shoulder Rolls  - 2 x daily - 7 x weekly - 1 sets - 10 reps - 1-2 sec  hold - Standing Shoulder Extension with Dowel  - 2 x daily - 7 x weekly - 1 sets - 5-10 reps - 5-10  sec  hold - Supine Diaphragmatic Breathing  - 2 x daily - 7 x weekly - 1 sets - 10 reps - 4-6 sec  hold - Standing Shoulder W at Wall  - 2 x daily - 7 x weekly - 1 sets - 10 reps - 10 sec  hold - Standing Shoulder Internal Rotation Stretch with Towel  - 2 x daily - 7 x weekly - 1 sets - 3-5 reps - 15-20 sec  hold - Shoulder Flexion Wall Slide with Towel  - 2 x daily - 7 x weekly - 1 sets - 5-10 reps - 10 sec  hold - Seated Shoulder Flexion AAROM with Pulley Behind  - 2 x daily - 7 x weekly - 1 sets - 10 reps - 10 sec  hold - Seated Shoulder Scaption AAROM with Pulley at Side  - 2 x daily - 7 x weekly - 1 sets - 10 reps - 10sec  hold - Standing Shoulder Row Reactive Isometric  - 2 x daily - 7 x weekly - 1 sets - 10 reps - 20-30 sec  hold  ASSESSMENT:  CLINICAL IMPRESSION:  10/08/21 - patient returns without bandage or sling. She reports that she has done exercises at home consistently and  is not having much pain. She demonstrates good technique with exercises. Reviewed and progressed home exercises without difficulty   Evaluation Patient is a 36 y.o. female who was seen today for physical therapy evaluation and treatment for bilat shoulder pain of a recurrent nature now s/p Rt shoulder arthroscopy for debridement. Patient has a history of bilat shoulder pain over the past several years with symptoms treated with medications and injections. She has a history of fibromyalgia and chronic back and neck pain. Patient is sedentary, not working at this time. She presents with Rt UE in sling with post op dressing. She has poor posture and alignment; limited ROM/mobility Rt UE; generalized weakness; decreased functional activity level; pain. Patient will benefit from PT to address problems identified.    OBJECTIVE IMPAIRMENTS decreased activity tolerance, decreased mobility, decreased ROM, decreased strength, impaired flexibility, impaired UE functional use, improper body mechanics, postural dysfunction,  obesity, and pain.   ACTIVITY LIMITATIONS carrying and lifting  PARTICIPATION LIMITATIONS: occupation and ADL's  PERSONAL FACTORS Fitness, Past/current experiences, and Time since onset of injury/illness/exacerbation are also affecting patient's functional outcome.   REHAB POTENTIAL: Good  CLINICAL DECISION MAKING: Stable/uncomplicated  EVALUATION COMPLEXITY: Low   GOALS: Goals reviewed with patient? Yes   LONG TERM GOALS: Target date: 11/19/2021  (Remove Blue Hyperlink)  Decrease pain in bilat UE's by 50-75% allowing patient to return to all normal functional activities  Baseline:  Goal status: INITIAL  2.  Improve posture and alignment with patient to demonstrate improved upright posture with posterior shoulder girdle musculature engaged  Baseline:  Goal status: INITIAL  3.  4/5 to 5/5 strength Rt UE  Baseline:  Goal status: INITIAL  4.  AROM Rt shoulder to =/> than AROM Lt shoulder  Baseline:  Goal status: INITIAL  5.  Independent in HEP (including aquatic program as indicated) Baseline:  Goal status: INITIAL  6.  Improve functional limitation score to  Baseline:  Goal status: INITIAL   PLAN: PT FREQUENCY: 2x/week  PT DURATION: 6 weeks  PLANNED INTERVENTIONS: Therapeutic exercises, Therapeutic activity, Neuromuscular re-education, Patient/Family education, Self Care, Joint mobilization, Aquatic Therapy, Dry Needling, Electrical stimulation, Cryotherapy, Moist heat, Taping, Vasopneumatic device, Ultrasound, Manual therapy, and Re-evaluation  PLAN FOR NEXT SESSION: review HEP; progress with AAROM to AROM and strengthening all planes as patient tolerates; postural strengthening; manual work and modalities as indicated    Karon Cotterill Rober Minion, PT, MPH  10/08/2021, 1:48 PM

## 2021-10-09 DIAGNOSIS — M19011 Primary osteoarthritis, right shoulder: Secondary | ICD-10-CM | POA: Diagnosis not present

## 2021-10-10 ENCOUNTER — Other Ambulatory Visit: Payer: Self-pay

## 2021-10-10 ENCOUNTER — Encounter: Payer: Self-pay | Admitting: Rehabilitative and Restorative Service Providers"

## 2021-10-10 ENCOUNTER — Ambulatory Visit: Payer: BC Managed Care – PPO | Admitting: Rehabilitative and Restorative Service Providers"

## 2021-10-10 DIAGNOSIS — R29898 Other symptoms and signs involving the musculoskeletal system: Secondary | ICD-10-CM | POA: Diagnosis not present

## 2021-10-10 DIAGNOSIS — R293 Abnormal posture: Secondary | ICD-10-CM

## 2021-10-10 DIAGNOSIS — M25511 Pain in right shoulder: Secondary | ICD-10-CM | POA: Diagnosis not present

## 2021-10-10 DIAGNOSIS — G2589 Other specified extrapyramidal and movement disorders: Secondary | ICD-10-CM

## 2021-10-10 NOTE — Therapy (Signed)
OUTPATIENT PHYSICAL THERAPY SHOULDER EVALUATION   Patient Name: Chelsea [name] MRN: [id number] DOB:1985/03/23, 36 y.o., female Today's Date: 10/10/2021   PT End of Session - 10/10/21 1358     Visit Number 3    Number of Visits 12    Date for PT Re-Evaluation 11/15/21    PT Start Time 1358    PT Stop Time 1443    PT Time Calculation (min) 45 min             Past Medical History:  Diagnosis Date   Depression    Endometrial polyp    Fibromyalgia    History of recurrent UTIs    Hypertension    Infertility, anovulation    Myofascial muscle pain    OA (osteoarthritis) of knee    BILATERAL   PCOS (polycystic ovarian syndrome)    PONV (postoperative nausea and vomiting)    Seasonal allergic rhinitis    Past Surgical History:  Procedure Laterality Date   CHOLECYSTECTOMY N/A 07/11/2021   Procedure: LAPAROSCOPIC CHOLECYSTECTOMY WITH INTRAOPERATIVE CHOLANGIOGRAM;  Surgeon: Manus Rudd, MD;  Location: [facility];  Service: General;  Laterality: N/A;   HYSTEROSCOPY WITH D & C N/A 09/07/2014   Procedure:  DILATATION AND CURETTAGE /HYSTEROSCOPY/POLYPECTOMY;  Surgeon: Fermin Schwab, MD;  Location: [facility];  Service: Gynecology;  Laterality: N/A;   HYSTEROSCOPY WITH D & C N/A 07/25/2021   Procedure: DILATATION AND CURETTAGE /HYSTEROSCOPY;  Surgeon: Jerene Bears, MD;  Location: [facility];  Service: Gynecology;  Laterality: N/A;   I & D RIGHT FOOT  1998   LAPAROSCOPIC GASTRIC SLEEVE RESECTION N/A 10/20/2017   Procedure: LAPAROSCOPIC GASTRIC SLEEVE RESECTION, UPPER ENDO, ERAS Pathway;  Surgeon: Berna Bue, MD;  Location: [facility];  Service: General;  Laterality: N/A;   POLYPECTOMY     uterine   TONSILLECTOMY  2001   WISDOM TOOTH EXTRACTION  2004   Patient Active Problem List   Diagnosis Date Noted   Simple endometrial hyperplasia    Acute calculous cholecystitis 07/11/2021   Acute cholecystitis due to biliary calculus 07/11/2021   Right  ankle sprain 07/19/2020   Chronic pain syndrome 05/30/2020   Impingement syndrome of both shoulders 08/17/2019   Primary insomnia 05/11/2019   Paronychia of second toe, left 01/06/2019   Mixed hypercholesterolemia and hypertriglyceridemia 11/13/2018   Orgasmic headache 11/11/2018   Diet-controlled diabetes mellitus (HCC) 04/25/2017   Low back pain 08/24/2015   Intractable migraine without aura and with status migrainosus 06/24/2014   Myofascial pain syndrome with depression 02/08/2014   Venous stasis dermatitis 01/25/2014   Right hip pain 12/13/2013   Metatarsalgia of both feet 10/18/2013   Right knee pain 02/13/2012   Essential hypertension, benign 11/25/2008   POLYCYSTIC OVARIAN DISEASE 10/26/2008   History of gastric sleeve 10/26/2008    PCP: Dr Rodney Langton  REFERRING PROVIDER: Dr Ramond Marrow   REFERRING DIAG: Arthroscopy Rt shoulder    THERAPY DIAG:  Acute pain of right shoulder  Other symptoms and signs involving the musculoskeletal system  Abnormal posture  Scapular dyskinesis  Rationale for Evaluation and Treatment Rehabilitation  ONSET DATE: 10/01/21  SUBJECTIVE:              10/10/21: Patient saw the PA yesterday and sutures are removed and she is doing well. Limited to 5 3 lifting. Patient reports that she continues to have some weakness in the Rt shoulder and some discomfort and pulling in the shoulder with reaching out to the  side and sometimes forward. She is no longer sleeping with the sling. She is working on her exercises at least 1-2 times a day. .                                                                                                                                                                                     SUBJECTIVE STATEMENT: Evaluation: Patient reports that she has had ongoing bilat shoulder pain worsening in the past couple years. She had arthroscopic surgery 10/01/21 for debridement of Rt shoulder.   PERTINENT HISTORY: History  of recurrent bilat shoulder pain for several years with injections Rt shoulder 3/22 and Lt 4/22. History of fibromyalgia; HTN  PAIN:  Are you having pain? Yes: NPRS scale: 3/10 Pain location: shoulder  Pain description: pulling and tightness  Aggravating factors: moving arm Relieving factors: meds; ice  PRECAUTIONS: None  FALLS:  Has patient fallen in last 6 months? No  LIVING ENVIRONMENT: Lives with: parents  Lives in: House/apartment Has following equipment at home: shower chair, Shower bench, and Grab bars  OCCUPATION: Not working due to fibromyalgia pain   PLOF: Independent  PATIENT GOALS be able to use arm again and not hurt as much in the shoulder; wash her own hair   OBJECTIVE:   DIAGNOSTIC FINDINGS:  09/16/21 MRI Rt shoulder: 1.  Mild supraspinatus tendinosis without discrete tear.2.  Mild acromioclavicular osteoarthritis.  3.  No evidence of rotator cuff muscle atrophy.4. No glenohumeral joint effusion or significant arthropathy on this non-arthrographic examination.  PATIENT SURVEYS:  FOTO 4  COGNITION:  Overall cognitive status: Within functional limits for tasks assessed     SENSATION: WFL  POSTURE: Head forward; shoulders rounded and elevated; head of the humerus anterior in orientation;scapulae abducted and rotated along the thoracic wall; significant increase in thoracic kyphosis; Rt UE supported in sling   UPPER EXTREMITY ROM:  Rt UE not tested; Lt UE WFLs' limited end ranges shoulder elevation   Active ROM Right eval Left eval  Shoulder flexion  50  Shoulder extension  155  Shoulder abduction  156  Shoulder adduction  90 at 90 deg shd abd/elbow 90 deg flex  Shoulder internal rotation    Shoulder external rotation    Elbow flexion    Elbow extension    Wrist flexion    Wrist extension    Wrist ulnar deviation    Wrist radial deviation    Wrist pronation    Wrist supination    (Blank rows = not tested)  UPPER EXTREMITY MMT: not tested;  Lt UE WFL's   MMT Right eval Left eval  Shoulder flexion    Shoulder extension  Shoulder abduction    Shoulder adduction    Shoulder internal rotation    Shoulder external rotation    Middle trapezius    Lower trapezius    Elbow flexion    Elbow extension    Wrist flexion    Wrist extension    Wrist ulnar deviation    Wrist radial deviation    Wrist pronation    Wrist supination    Grip strength (lbs)    (Blank rows = not tested)   PALPATION:  Muscular tightness noted Rt > Lt pecs; biceps; upper trap; leveator; teres   TODAY'S TREATMENT:           Therapeutic exercises; PROM and stretching by PT.  Scap squeeze 10 sec x 10 noodle along spine  ER with cane 10 sec x 5 reps  Extension with cane 5 sec x 5 reps   IR with strap x 5 10 sec hold  Wall slide with washcloth x 10  Row red TB isometric step back x 5 sec hold x 10  ER red TB isometric step back x 5 sec hold x 10  IR red TB isometric step back x 5 sec hold x 10 reps   Manual:  STM Rt pecs/anterior shoulder PROM Rt shoulder flex; scaption; ER/IR in scapular plane; extension     PATIENT EDUCATION: Education details: POC; HEP; ice for pain management; postural correction Person educated: Patient Education method: Explanation, Demonstration, Tactile cues, Verbal cues, and Handouts Education comprehension: verbalized understanding, returned demonstration, verbal cues required, tactile cues required, and needs further education   HOME EXERCISE PROGRAM: Access Code: [email or web address] URL: [email or web address] Date: 10/10/2021 Prepared by: Corlis Leak  Exercises - Seated Scapular Retraction  - 2 x daily - 7 x weekly - 1-2 sets - 10 reps - 10 sec  hold - Circular Shoulder Pendulum with Table Support  - 3-4 x daily - 7 x weekly - 1 sets - 20-30 reps - Standing Shoulder and Trunk Flexion at Table  - 2 x daily - 7 x weekly - 1 sets - 5 reps - 5 sec  hold - Seated Shoulder Flexion Towel Slide at Table Top  Full Range of Motion  - 2 x daily - 7 x weekly - 1 sets - 5-10 reps - 10sec  hold - Seated Shoulder External Rotation AAROM with Cane and Hand in Neutral  - 2 x daily - 7 x weekly - 1 sets - 5-10 reps - 5-10 sec  hold - Standing Backward Shoulder Rolls  - 2 x daily - 7 x weekly - 1 sets - 10 reps - 1-2 sec  hold - Standing Shoulder Extension with Dowel  - 2 x daily - 7 x weekly - 1 sets - 5-10 reps - 5-10 sec  hold - Supine Diaphragmatic Breathing  - 2 x daily - 7 x weekly - 1 sets - 10 reps - 4-6 sec  hold - Standing Shoulder W at Wall  - 2 x daily - 7 x weekly - 1 sets - 10 reps - 10 sec  hold - Standing Shoulder Internal Rotation Stretch with Towel  - 2 x daily - 7 x weekly - 1 sets - 3-5 reps - 15-20 sec  hold - Shoulder Flexion Wall Slide with Towel  - 2 x daily - 7 x weekly - 1 sets - 5-10 reps - 10 sec  hold - Seated Shoulder Flexion AAROM with Pulley Behind  - 2 x daily - 7 x weekly -  1 sets - 10 reps - 10 sec  hold - Seated Shoulder Scaption AAROM with Pulley at Side  - 2 x daily - 7 x weekly - 1 sets - 10 reps - 10sec  hold - Standing Shoulder Row Reactive Isometric  - 2 x daily - 7 x weekly - 1 sets - 10 reps - 20-30 sec  hold - Shoulder External Rotation Reactive Isometrics  - 1 x daily - 7 x weekly - 1 sets - 5-10 reps - 10-30 sec  hold - Shoulder Internal Rotation Reactive Isometrics  - 2 x daily - 7 x weekly - 1 sets - 10 reps - 3-5 sec  hold  ASSESSMENT:  CLINICAL IMPRESSION:  10/10/21 - Sutures removed yesterday. PA pleased with progress. Patient reports continued consistency with HEP and she demonstrates good technique with exercises. Reviewed and progressed home exercises without difficulty. Progressing well with shoulder rehab.   Evaluation Patient is a 36 y.o. female who was seen today for physical therapy evaluation and treatment for bilat shoulder pain of a recurrent nature now s/p Rt shoulder arthroscopy for debridement. Patient has a history of bilat shoulder pain over  the past several years with symptoms treated with medications and injections. She has a history of fibromyalgia and chronic back and neck pain. Patient is sedentary, not working at this time. She presents with Rt UE in sling with post op dressing. She has poor posture and alignment; limited ROM/mobility Rt UE; generalized weakness; decreased functional activity level; pain. Patient will benefit from PT to address problems identified.    OBJECTIVE IMPAIRMENTS decreased activity tolerance, decreased mobility, decreased ROM, decreased strength, impaired flexibility, impaired UE functional use, improper body mechanics, postural dysfunction, obesity, and pain.   ACTIVITY LIMITATIONS carrying and lifting  PARTICIPATION LIMITATIONS: occupation and ADL's  PERSONAL FACTORS Fitness, Past/current experiences, and Time since onset of injury/illness/exacerbation are also affecting patient's functional outcome.   REHAB POTENTIAL: Good  CLINICAL DECISION MAKING: Stable/uncomplicated  EVALUATION COMPLEXITY: Low   GOALS: Goals reviewed with patient? Yes   LONG TERM GOALS: Target date: 11/21/2021  (Remove Blue Hyperlink)  Decrease pain in bilat UE's by 50-75% allowing patient to return to all normal functional activities  Baseline:  Goal status: INITIAL  2.  Improve posture and alignment with patient to demonstrate improved upright posture with posterior shoulder girdle musculature engaged  Baseline:  Goal status: INITIAL  3.  4/5 to 5/5 strength Rt UE  Baseline:  Goal status: INITIAL  4.  AROM Rt shoulder to =/> than AROM Lt shoulder  Baseline:  Goal status: INITIAL  5.  Independent in HEP (including aquatic program as indicated) Baseline:  Goal status: INITIAL  6.  Improve functional limitation score to  Baseline:  Goal status: INITIAL   PLAN: PT FREQUENCY: 2x/week  PT DURATION: 6 weeks  PLANNED INTERVENTIONS: Therapeutic exercises, Therapeutic activity, Neuromuscular  re-education, Patient/Family education, Self Care, Joint mobilization, Aquatic Therapy, Dry Needling, Electrical stimulation, Cryotherapy, Moist heat, Taping, Vasopneumatic device, Ultrasound, Manual therapy, and Re-evaluation  PLAN FOR NEXT SESSION: review HEP; progress with AAROM to AROM and strengthening all planes as patient tolerates; postural strengthening; manual work and modalities as indicated    Leonilda Cozby Rober Minion, PT, MPH  10/10/2021, 2:37 PM

## 2021-10-11 DIAGNOSIS — Z1379 Encounter for other screening for genetic and chromosomal anomalies: Secondary | ICD-10-CM | POA: Diagnosis not present

## 2021-10-11 DIAGNOSIS — E230 Hypopituitarism: Secondary | ICD-10-CM | POA: Diagnosis not present

## 2021-10-11 DIAGNOSIS — N978 Female infertility of other origin: Secondary | ICD-10-CM | POA: Diagnosis not present

## 2021-10-11 DIAGNOSIS — N915 Oligomenorrhea, unspecified: Secondary | ICD-10-CM | POA: Diagnosis not present

## 2021-10-11 DIAGNOSIS — N970 Female infertility associated with anovulation: Secondary | ICD-10-CM | POA: Diagnosis not present

## 2021-10-15 ENCOUNTER — Other Ambulatory Visit: Payer: Self-pay

## 2021-10-15 ENCOUNTER — Encounter: Payer: Self-pay | Admitting: Rehabilitative and Restorative Service Providers"

## 2021-10-15 ENCOUNTER — Ambulatory Visit: Payer: BC Managed Care – PPO | Admitting: Rehabilitative and Restorative Service Providers"

## 2021-10-15 DIAGNOSIS — R293 Abnormal posture: Secondary | ICD-10-CM

## 2021-10-15 DIAGNOSIS — G2589 Other specified extrapyramidal and movement disorders: Secondary | ICD-10-CM

## 2021-10-15 DIAGNOSIS — M25511 Pain in right shoulder: Secondary | ICD-10-CM | POA: Diagnosis not present

## 2021-10-15 DIAGNOSIS — R29898 Other symptoms and signs involving the musculoskeletal system: Secondary | ICD-10-CM

## 2021-10-15 NOTE — Therapy (Signed)
OUTPATIENT PHYSICAL THERAPY SHOULDER EVALUATION   Patient Name: Chelsea [name] MRN: [id number] DOB:Jan 25, 1986, 36 y.o., female Today's Date: 10/15/2021   PT End of Session - 10/15/21 1311     Visit Number 4    Number of Visits 12    Date for PT Re-Evaluation 11/15/21    PT Start Time 1311    PT Stop Time 1356    PT Time Calculation (min) 45 min    Activity Tolerance Patient tolerated treatment well             Past Medical History:  Diagnosis Date   Depression    Endometrial polyp    Fibromyalgia    History of recurrent UTIs    Hypertension    Infertility, anovulation    Myofascial muscle pain    OA (osteoarthritis) of knee    BILATERAL   PCOS (polycystic ovarian syndrome)    PONV (postoperative nausea and vomiting)    Seasonal allergic rhinitis    Past Surgical History:  Procedure Laterality Date   CHOLECYSTECTOMY N/A 07/11/2021   Procedure: LAPAROSCOPIC CHOLECYSTECTOMY WITH INTRAOPERATIVE CHOLANGIOGRAM;  Surgeon: Manus Rudd, MD;  Location: [facility];  Service: General;  Laterality: N/A;   HYSTEROSCOPY WITH D & C N/A 09/07/2014   Procedure:  DILATATION AND CURETTAGE /HYSTEROSCOPY/POLYPECTOMY;  Surgeon: Fermin Schwab, MD;  Location: [facility];  Service: Gynecology;  Laterality: N/A;   HYSTEROSCOPY WITH D & C N/A 07/25/2021   Procedure: DILATATION AND CURETTAGE /HYSTEROSCOPY;  Surgeon: Jerene Bears, MD;  Location: [facility] [place];  Service: Gynecology;  Laterality: N/A;   I & D RIGHT FOOT  1998   LAPAROSCOPIC GASTRIC SLEEVE RESECTION N/A 10/20/2017   Procedure: LAPAROSCOPIC GASTRIC SLEEVE RESECTION, UPPER ENDO, ERAS Pathway;  Surgeon: Berna Bue, MD;  Location: [facility];  Service: General;  Laterality: N/A;   POLYPECTOMY     uterine   TONSILLECTOMY  2001   WISDOM TOOTH EXTRACTION  2004   Patient Active Problem List   Diagnosis Date Noted   Simple endometrial hyperplasia    Acute calculous cholecystitis 07/11/2021   Acute  cholecystitis due to biliary calculus 07/11/2021   Right ankle sprain 07/19/2020   Chronic pain syndrome 05/30/2020   Impingement syndrome of both shoulders 08/17/2019   Primary insomnia 05/11/2019   Paronychia of second toe, left 01/06/2019   Mixed hypercholesterolemia and hypertriglyceridemia 11/13/2018   Orgasmic headache 11/11/2018   Diet-controlled diabetes mellitus (HCC) 04/25/2017   Low back pain 08/24/2015   Intractable migraine without aura and with status migrainosus 06/24/2014   Myofascial pain syndrome with depression 02/08/2014   Venous stasis dermatitis 01/25/2014   Right hip pain 12/13/2013   Metatarsalgia of both feet 10/18/2013   Right knee pain 02/13/2012   Essential hypertension, benign 11/25/2008   POLYCYSTIC OVARIAN DISEASE 10/26/2008   History of gastric sleeve 10/26/2008    PCP: Dr Rodney Langton  REFERRING PROVIDER: Dr Ramond Marrow   REFERRING DIAG: Arthroscopy Rt shoulder    THERAPY DIAG:  Acute pain of right shoulder  Other symptoms and signs involving the musculoskeletal system  Abnormal posture  Scapular dyskinesis  Rationale for Evaluation and Treatment Rehabilitation  ONSET DATE: 10/01/21  SUBJECTIVE:              10/15/21: Patient reports that she is limited to 5 # lifting. Patient reports that she continues to have some soreness and weakness in the Rt shoulder and some discomfort and pulling in the shoulder with reaching out  to the side and sometimes forward. She is working on her exercises at least 1-2 times a day. .                                                                                                                                                                                     SUBJECTIVE STATEMENT: Evaluation: Patient reports that she has had ongoing bilat shoulder pain worsening in the past couple years. She had arthroscopic surgery 10/01/21 for debridement of Rt shoulder.   PERTINENT HISTORY: History of recurrent bilat  shoulder pain for several years with injections Rt shoulder 3/22 and Lt 4/22. History of fibromyalgia; HTN  PAIN:  Are you having pain? Yes: NPRS scale: 0/10 Pain location: shoulder  Pain description: sore and tender  Aggravating factors: moving arm Relieving factors: meds; ice  PRECAUTIONS: None  FALLS:  Has patient fallen in last 6 months? No  LIVING ENVIRONMENT: Lives with: parents  Lives in: House/apartment Has following equipment at home: shower chair, Shower bench, and Grab bars  OCCUPATION: Not working due to fibromyalgia pain   PLOF: Independent  PATIENT GOALS be able to use arm again and not hurt as much in the shoulder; wash her own hair   OBJECTIVE:   DIAGNOSTIC FINDINGS:  09/16/21 MRI Rt shoulder: 1.  Mild supraspinatus tendinosis without discrete tear.2.  Mild acromioclavicular osteoarthritis.  3.  No evidence of rotator cuff muscle atrophy.4. No glenohumeral joint effusion or significant arthropathy on this non-arthrographic examination.  PATIENT SURVEYS:  FOTO 4  COGNITION:  Overall cognitive status: Within functional limits for tasks assessed     SENSATION: WFL  POSTURE: Head forward; shoulders rounded and elevated; head of the humerus anterior in orientation;scapulae abducted and rotated along the thoracic wall; significant increase in thoracic kyphosis; Rt UE supported in sling   UPPER EXTREMITY ROM:  Rt UE not tested; Lt UE WFLs' limited end ranges shoulder elevation   Active ROM Right eval Left eval  Shoulder flexion  50  Shoulder extension  155  Shoulder abduction  156  Shoulder adduction  90 at 90 deg shd abd/elbow 90 deg flex  Shoulder internal rotation    Shoulder external rotation    Elbow flexion    Elbow extension    Wrist flexion    Wrist extension    Wrist ulnar deviation    Wrist radial deviation    Wrist pronation    Wrist supination    (Blank rows = not tested)  UPPER EXTREMITY MMT: not tested; Lt UE WFL's   MMT  Right eval Left eval  Shoulder flexion    Shoulder extension    Shoulder abduction  Shoulder adduction    Shoulder internal rotation    Shoulder external rotation    Middle trapezius    Lower trapezius    Elbow flexion    Elbow extension    Wrist flexion    Wrist extension    Wrist ulnar deviation    Wrist radial deviation    Wrist pronation    Wrist supination    Grip strength (lbs)    (Blank rows = not tested)   PALPATION:  Muscular tightness noted Rt > Lt pecs; biceps; upper trap; leveator; teres   TODAY'S TREATMENT:            Therapeutic exercises; PROM and stretching by PT.  Pulley flexion x 10; scaption x 10; horiz ab/ad x 10  Scap squeeze 10 sec x 10 noodle along spine  ER with cane 10 sec x 5 reps  Extension with cane 5 sec x 5 reps   IR with strap x 5 10 sec hold  Wall slide with washcloth x 10  Row green TB isometric step back x 5 sec hold x 10  ER green TB isometric step back x 5 sec hold x 10  IR green TB isometric step back x 5 sec hold x 10 reps  Shoulder extension green x 10  Row green TB x 10  ER green TB x 10 IR green TB x 10 Scap squeeze supine 10 sec x 10  Supine AROM shoulder flexion x 3  Upper trap stretch 15 sec x 3 reps sitting    Manual:  STM Rt pecs/anterior shoulder PROM Rt shoulder flex; scaption; ER/IR in scapular plane; extension  Scar massage for score sites     PATIENT EDUCATION: Education details: POC; HEP; ice for pain management; postural correction Person educated: Patient Education method: Explanation, Demonstration, Tactile cues, Verbal cues, and Handouts Education comprehension: verbalized understanding, returned demonstration, verbal cues required, tactile cues required, and needs further education   HOME EXERCISE PROGRAM:  Access Code: [email or web address] URL: [email or web address] Date: 10/15/2021 Prepared by: Corlis Leak  Exercises - Seated Scapular Retraction  - 2 x daily - 7 x weekly - 1-2 sets - 10  reps - 10 sec  hold - Circular Shoulder Pendulum with Table Support  - 3-4 x daily - 7 x weekly - 1 sets - 20-30 reps - Standing Shoulder and Trunk Flexion at Table  - 2 x daily - 7 x weekly - 1 sets - 5 reps - 5 sec  hold - Seated Shoulder Flexion Towel Slide at Table Top Full Range of Motion  - 2 x daily - 7 x weekly - 1 sets - 5-10 reps - 10sec  hold - Seated Shoulder External Rotation AAROM with Cane and Hand in Neutral  - 2 x daily - 7 x weekly - 1 sets - 5-10 reps - 5-10 sec  hold - Standing Backward Shoulder Rolls  - 2 x daily - 7 x weekly - 1 sets - 10 reps - 1-2 sec  hold - Standing Shoulder Extension with Dowel  - 2 x daily - 7 x weekly - 1 sets - 5-10 reps - 5-10 sec  hold - Supine Diaphragmatic Breathing  - 2 x daily - 7 x weekly - 1 sets - 10 reps - 4-6 sec  hold - Standing Shoulder W at Wall  - 2 x daily - 7 x weekly - 1 sets - 10 reps - 10 sec  hold - Standing Shoulder Internal Rotation Stretch with Towel  -  2 x daily - 7 x weekly - 1 sets - 3-5 reps - 15-20 sec  hold - Shoulder Flexion Wall Slide with Towel  - 2 x daily - 7 x weekly - 1 sets - 5-10 reps - 10 sec  hold - Seated Shoulder Flexion AAROM with Pulley Behind  - 2 x daily - 7 x weekly - 1 sets - 10 reps - 10 sec  hold - Seated Shoulder Scaption AAROM with Pulley at Side  - 2 x daily - 7 x weekly - 1 sets - 10 reps - 10sec  hold - Standing Shoulder Row Reactive Isometric  - 2 x daily - 7 x weekly - 1 sets - 10 reps - 20-30 sec  hold - Shoulder External Rotation Reactive Isometrics  - 1 x daily - 7 x weekly - 1 sets - 5-10 reps - 10-30 sec  hold - Shoulder Internal Rotation Reactive Isometrics  - 2 x daily - 7 x weekly - 1 sets - 10 reps - 3-5 sec  hold - Standing Bilateral Low Shoulder Row with Anchored Resistance  - 2 x daily - 7 x weekly - 1-3 sets - 10 reps - 2-3 sec  hold - Shoulder Extension with Resistance  - 2 x daily - 7 x weekly - 1-3 sets - 10 reps - 2-3 sec  hold - Shoulder External Rotation with Anchored  Resistance  - 2 x daily - 7 x weekly - 1-2 sets - 10 reps - 3 sec  hold - Shoulder Internal Rotation with Resistance  - 2 x daily - 7 x weekly - 2 sets - 10 reps - 3 sec  hold - Drawing Bow  - 1 x daily - 7 x weekly - 1 sets - 10 reps - 3 sec  hold - Upper Trapezius Stretch  - 2 x daily - 7 x weekly - 1 sets - 3 reps - 10-15 sec  hold  Patient Education - Scar Massage  ASSESSMENT:  CLINICAL IMPRESSION:  10/15/21 - Patient continues to work on [employer]. Reviewed and progressed resistive exercises without difficulty. Progressing well with shoulder rehab.   Evaluation Patient is a 36 y.o. female who was seen today for physical therapy evaluation and treatment for bilat shoulder pain of a recurrent nature now s/p Rt shoulder arthroscopy for debridement. Patient has a history of bilat shoulder pain over the past several years with symptoms treated with medications and injections. She has a history of fibromyalgia and chronic back and neck pain. Patient is sedentary, not working at this time. She presents with Rt UE in sling with post op dressing. She has poor posture and alignment; limited ROM/mobility Rt UE; generalized weakness; decreased functional activity level; pain. Patient will benefit from PT to address problems identified.    OBJECTIVE IMPAIRMENTS decreased activity tolerance, decreased mobility, decreased ROM, decreased strength, impaired flexibility, impaired UE functional use, improper body mechanics, postural dysfunction, obesity, and pain.   ACTIVITY LIMITATIONS carrying and lifting  PARTICIPATION LIMITATIONS: occupation and ADL's  PERSONAL FACTORS Fitness, Past/current experiences, and Time since onset of injury/illness/exacerbation are also affecting patient's functional outcome.   REHAB POTENTIAL: Good  CLINICAL DECISION MAKING: Stable/uncomplicated  EVALUATION COMPLEXITY: Low   GOALS: Goals reviewed with patient? Yes   LONG TERM GOALS: Target date: 11/26/2021  (Remove Blue  Hyperlink)  Decrease pain in bilat UE's by 50-75% allowing patient to return to all normal functional activities  Baseline:  Goal status: INITIAL  2.  Improve posture and  alignment with patient to demonstrate improved upright posture with posterior shoulder girdle musculature engaged  Baseline:  Goal status: INITIAL  3.  4/5 to 5/5 strength Rt UE  Baseline:  Goal status: INITIAL  4.  AROM Rt shoulder to =/> than AROM Lt shoulder  Baseline:  Goal status: INITIAL  5.  Independent in HEP (including aquatic program as indicated) Baseline:  Goal status: INITIAL  6.  Improve functional limitation score to  Baseline:  Goal status: INITIAL   PLAN: PT FREQUENCY: 2x/week  PT DURATION: 6 weeks  PLANNED INTERVENTIONS: Therapeutic exercises, Therapeutic activity, Neuromuscular re-education, Patient/Family education, Self Care, Joint mobilization, Aquatic Therapy, Dry Needling, Electrical stimulation, Cryotherapy, Moist heat, Taping, Vasopneumatic device, Ultrasound, Manual therapy, and Re-evaluation  PLAN FOR NEXT SESSION: review HEP; progress with AAROM to AROM and strengthening all planes as patient tolerates; postural strengthening; manual work and modalities as indicated    Rochel Privett Rober Minion, PT, MPH  10/15/2021, 1:48 PM

## 2021-10-17 ENCOUNTER — Ambulatory Visit: Payer: BC Managed Care – PPO | Admitting: Physical Therapy

## 2021-10-17 ENCOUNTER — Encounter: Payer: Self-pay | Admitting: Physical Therapy

## 2021-10-17 DIAGNOSIS — M25511 Pain in right shoulder: Secondary | ICD-10-CM

## 2021-10-17 DIAGNOSIS — R29898 Other symptoms and signs involving the musculoskeletal system: Secondary | ICD-10-CM

## 2021-10-17 DIAGNOSIS — R293 Abnormal posture: Secondary | ICD-10-CM

## 2021-10-17 DIAGNOSIS — G2589 Other specified extrapyramidal and movement disorders: Secondary | ICD-10-CM

## 2021-10-17 NOTE — Therapy (Signed)
OUTPATIENT PHYSICAL THERAPY SHOULDER EVALUATION   Patient Name: Chelsea [name] MRN: [id number] DOB:04-Jan-1986, 36 y.o., female Today's Date: 10/17/2021   PT End of Session - 10/17/21 1358     Visit Number 5    Number of Visits 12    Date for PT Re-Evaluation 11/15/21    PT Start Time 1315    PT Stop Time 1358    PT Time Calculation (min) 43 min    Activity Tolerance Patient tolerated treatment well    Behavior During Therapy WFL for tasks assessed/performed              Past Medical History:  Diagnosis Date   Depression    Endometrial polyp    Fibromyalgia    History of recurrent UTIs    Hypertension    Infertility, anovulation    Myofascial muscle pain    OA (osteoarthritis) of knee    BILATERAL   PCOS (polycystic ovarian syndrome)    PONV (postoperative nausea and vomiting)    Seasonal allergic rhinitis    Past Surgical History:  Procedure Laterality Date   CHOLECYSTECTOMY N/A 07/11/2021   Procedure: LAPAROSCOPIC CHOLECYSTECTOMY WITH INTRAOPERATIVE CHOLANGIOGRAM;  Surgeon: Manus Rudd, MD;  Location: [facility];  Service: General;  Laterality: N/A;   HYSTEROSCOPY WITH D & C N/A 09/07/2014   Procedure:  DILATATION AND CURETTAGE /HYSTEROSCOPY/POLYPECTOMY;  Surgeon: Fermin Schwab, MD;  Location:  [facility];  Service: Gynecology;  Laterality: N/A;   HYSTEROSCOPY WITH D & C N/A 07/25/2021   Procedure: DILATATION AND CURETTAGE /HYSTEROSCOPY;  Surgeon: Jerene Bears, MD;  Location: [facility] ;  Service: Gynecology;  Laterality: N/A;   I & D RIGHT FOOT  1998   LAPAROSCOPIC GASTRIC SLEEVE RESECTION N/A 10/20/2017   Procedure: LAPAROSCOPIC GASTRIC SLEEVE RESECTION, UPPER ENDO, ERAS Pathway;  Surgeon: Berna Bue, MD;  Location: [facility];  Service: General;  Laterality: N/A;   POLYPECTOMY     uterine   TONSILLECTOMY  2001   WISDOM TOOTH EXTRACTION  2004   Patient Active Problem List   Diagnosis Date Noted   Simple endometrial  hyperplasia    Acute calculous cholecystitis 07/11/2021   Acute cholecystitis due to biliary calculus 07/11/2021   Right ankle sprain 07/19/2020   Chronic pain syndrome 05/30/2020   Impingement syndrome of both shoulders 08/17/2019   Primary insomnia 05/11/2019   Paronychia of second toe, left 01/06/2019   Mixed hypercholesterolemia and hypertriglyceridemia 11/13/2018   Orgasmic headache 11/11/2018   Diet-controlled diabetes mellitus (HCC) 04/25/2017   Low back pain 08/24/2015   Intractable migraine without aura and with status migrainosus 06/24/2014   Myofascial pain syndrome with depression 02/08/2014   Venous stasis dermatitis 01/25/2014   Right hip pain 12/13/2013   Metatarsalgia of both feet 10/18/2013   Right knee pain 02/13/2012   Essential hypertension, benign 11/25/2008   POLYCYSTIC OVARIAN DISEASE 10/26/2008   History of gastric sleeve 10/26/2008    PCP: Dr Rodney Langton  REFERRING PROVIDER: Dr Ramond Marrow   REFERRING DIAG: Arthroscopy Rt shoulder    THERAPY DIAG:  Acute pain of right shoulder  Other symptoms and signs involving the musculoskeletal system  Abnormal posture  Scapular dyskinesis  Rationale for Evaluation and Treatment Rehabilitation  ONSET DATE: 10/01/21  SUBJECTIVE:              10/17/21 - Pt states she feels "tight" but no really in pain. She states she has been working on exercises at home  SUBJECTIVE STATEMENT: Evaluation: Patient reports that she has had ongoing bilat shoulder pain worsening in the past couple years. She had arthroscopic surgery 10/01/21 for debridement of Rt shoulder.   PERTINENT HISTORY: History of recurrent bilat shoulder pain for several years with injections Rt shoulder 3/22 and Lt 4/22. History of fibromyalgia; HTN  PAIN:  Are you having  pain? No  PRECAUTIONS: None  FALLS:  Has patient fallen in last 6 months? No  LIVING ENVIRONMENT: Lives with: parents  Lives in: House/apartment Has following equipment at home: shower chair, Shower bench, and Grab bars  OCCUPATION: Not working due to fibromyalgia pain   PLOF: Independent  PATIENT GOALS be able to use arm again and not hurt as much in the shoulder; wash her own hair   OBJECTIVE:   UPPER EXTREMITY ROM:  Rt UE not tested; Lt UE WFLs' limited end ranges shoulder elevation   Active ROM Right eval Left eval  Shoulder flexion  50  Shoulder extension  155  Shoulder abduction  156  Shoulder adduction  90 at 90 deg shd abd/elbow 90 deg flex  Shoulder internal rotation    Shoulder external rotation    Elbow flexion    Elbow extension    Wrist flexion    Wrist extension    Wrist ulnar deviation    Wrist radial deviation    Wrist pronation    Wrist supination    (Blank rows = not tested)  UPPER EXTREMITY MMT: not tested; Lt UE WFL's   MMT Right eval Left eval  Shoulder flexion    Shoulder extension    Shoulder abduction    Shoulder adduction    Shoulder internal rotation    Shoulder external rotation    Middle trapezius    Lower trapezius    Elbow flexion    Elbow extension    Wrist flexion    Wrist extension    Wrist ulnar deviation    Wrist radial deviation    Wrist pronation    Wrist supination    Grip strength (lbs)    (Blank rows = not tested)   PALPATION:  Muscular tightness noted Rt > Lt pecs; biceps; upper trap; leveator; teres   TODAY'S TREATMENT:   10/17/21 Therex Pulley flexion, scaption and horiz abd/add all x 10 Row green TB isometric step back x 5 sec hold x 10         ER green TB isometric step back x 5 sec hold x 10 IR green TB isometric step back x 5 sec hold x 10 Shld extension green TB x 15 Bilat shoulder ER with red TB, noodle behind back D2 flexion x 10 bilat with noodle behind back Alternating shoulder flexion  noodle behind back x 10 Upper trap stretch 3 x 15 sec bilat  Manual STM Rt anterior chest, UT, Rt anterior shoulder PROM Rt shoulder   10/15/21 Therapeutic exercises; PROM and stretching by PT.  Pulley flexion x 10; scaption x 10; horiz ab/ad x 10  Scap squeeze 10 sec x 10 noodle along spine  ER with cane 10 sec x 5 reps  Extension with cane 5 sec x 5 reps   IR with strap x 5 10 sec hold  Wall slide with washcloth x 10  Row green TB isometric step back x 5 sec hold x 10  ER green TB isometric step back x 5 sec hold x 10  IR green TB isometric step back x 5 sec hold x 10 reps  Shoulder extension green x 10  Row green TB x 10  ER green TB x 10 IR green TB x 10 Scap squeeze supine 10 sec x 10  Supine AROM shoulder flexion x 3  Upper trap stretch 15 sec x 3 reps sitting    Manual:  STM Rt pecs/anterior shoulder PROM Rt shoulder flex; scaption; ER/IR in scapular plane; extension  Scar massage for score sites     PATIENT EDUCATION: Education details: POC; HEP; ice for pain management; postural correction Person educated: Patient Education method: Explanation, Demonstration, Tactile cues, Verbal cues, and Handouts Education comprehension: verbalized understanding, returned demonstration, verbal cues required, tactile cues required, and needs further education   HOME EXERCISE PROGRAM:  Access Code: [email or web address] URL: [email or web address] Date: 10/15/2021 Prepared by: Corlis Leak  Exercises - Seated Scapular Retraction  - 2 x daily - 7 x weekly - 1-2 sets - 10 reps - 10 sec  hold - Circular Shoulder Pendulum with Table Support  - 3-4 x daily - 7 x weekly - 1 sets - 20-30 reps - Standing Shoulder and Trunk Flexion at Table  - 2 x daily - 7 x weekly - 1 sets - 5 reps - 5 sec  hold - Seated Shoulder Flexion Towel Slide at Table Top Full Range of Motion  - 2 x daily - 7 x weekly - 1 sets - 5-10 reps - 10sec  hold - Seated Shoulder External Rotation AAROM with Cane and  Hand in Neutral  - 2 x daily - 7 x weekly - 1 sets - 5-10 reps - 5-10 sec  hold - Standing Backward Shoulder Rolls  - 2 x daily - 7 x weekly - 1 sets - 10 reps - 1-2 sec  hold - Standing Shoulder Extension with Dowel  - 2 x daily - 7 x weekly - 1 sets - 5-10 reps - 5-10 sec  hold - Supine Diaphragmatic Breathing  - 2 x daily - 7 x weekly - 1 sets - 10 reps - 4-6 sec  hold - Standing Shoulder W at Wall  - 2 x daily - 7 x weekly - 1 sets - 10 reps - 10 sec  hold - Standing Shoulder Internal Rotation Stretch with Towel  - 2 x daily - 7 x weekly - 1 sets - 3-5 reps - 15-20 sec  hold - Shoulder Flexion Wall Slide with Towel  - 2 x daily - 7 x weekly - 1 sets - 5-10 reps - 10 sec  hold - Seated Shoulder Flexion AAROM with Pulley Behind  - 2 x daily - 7 x weekly - 1 sets - 10 reps - 10 sec  hold - Seated Shoulder Scaption AAROM with Pulley at Side  - 2 x daily - 7 x weekly - 1 sets - 10 reps - 10sec  hold - Standing Shoulder Row Reactive Isometric  - 2 x daily - 7 x weekly - 1 sets - 10 reps - 20-30 sec  hold - Shoulder External Rotation Reactive Isometrics  - 1 x daily - 7 x weekly - 1 sets - 5-10 reps - 10-30 sec  hold - Shoulder Internal Rotation Reactive Isometrics  - 2 x daily - 7 x weekly - 1 sets - 10 reps - 3-5 sec  hold - Standing Bilateral Low Shoulder Row with Anchored Resistance  - 2 x daily - 7 x weekly - 1-3 sets - 10 reps - 2-3 sec  hold - Shoulder Extension with Resistance  -  2 x daily - 7 x weekly - 1-3 sets - 10 reps - 2-3 sec  hold - Shoulder External Rotation with Anchored Resistance  - 2 x daily - 7 x weekly - 1-2 sets - 10 reps - 3 sec  hold - Shoulder Internal Rotation with Resistance  - 2 x daily - 7 x weekly - 2 sets - 10 reps - 3 sec  hold - Drawing Bow  - 1 x daily - 7 x weekly - 1 sets - 10 reps - 3 sec  hold - Upper Trapezius Stretch  - 2 x daily - 7 x weekly - 1 sets - 3 reps - 10-15 sec  hold  Patient Education - Scar Massage  ASSESSMENT:  CLINICAL  IMPRESSION:  10/17/21 - Pt continues to progress well with exercises and manual therapy. Working towards goals  Evaluation Patient is a 36 y.o. female who was seen today for physical therapy evaluation and treatment for bilat shoulder pain of a recurrent nature now s/p Rt shoulder arthroscopy for debridement. Patient has a history of bilat shoulder pain over the past several years with symptoms treated with medications and injections. She has a history of fibromyalgia and chronic back and neck pain. Patient is sedentary, not working at this time. She presents with Rt UE in sling with post op dressing. She has poor posture and alignment; limited ROM/mobility Rt UE; generalized weakness; decreased functional activity level; pain. Patient will benefit from PT to address problems identified.       GOALS: Goals reviewed with patient? Yes   LONG TERM GOALS:   Decrease pain in bilat UE's by 50-75% allowing patient to return to all normal functional activities  Baseline:  Goal status: INITIAL  2.  Improve posture and alignment with patient to demonstrate improved upright posture with posterior shoulder girdle musculature engaged  Baseline:  Goal status: INITIAL  3.  4/5 to 5/5 strength Rt UE  Baseline:  Goal status: INITIAL  4.  AROM Rt shoulder to =/> than AROM Lt shoulder  Baseline:  Goal status: INITIAL  5.  Independent in HEP (including aquatic program as indicated) Baseline:  Goal status: INITIAL  6.  Improve functional limitation score to  Baseline:  Goal status: INITIAL   PLAN: PT FREQUENCY: 2x/week  PT DURATION: 6 weeks  PLANNED INTERVENTIONS: Therapeutic exercises, Therapeutic activity, Neuromuscular re-education, Patient/Family education, Self Care, Joint mobilization, Aquatic Therapy, Dry Needling, Electrical stimulation, Cryotherapy, Moist heat, Taping, Vasopneumatic device, Ultrasound, Manual therapy, and Re-evaluation  PLAN FOR NEXT SESSION: review HEP; progress  with AAROM to AROM and strengthening all planes as patient tolerates; postural strengthening; manual work and modalities as indicated    Hamza Empson, PT 10/17/2021, 1:59 PM

## 2021-10-22 ENCOUNTER — Ambulatory Visit: Payer: BC Managed Care – PPO | Admitting: Rehabilitative and Restorative Service Providers"

## 2021-10-23 ENCOUNTER — Other Ambulatory Visit: Payer: Self-pay | Admitting: Sports Medicine

## 2021-10-23 DIAGNOSIS — E119 Type 2 diabetes mellitus without complications: Secondary | ICD-10-CM

## 2021-10-24 ENCOUNTER — Ambulatory Visit: Payer: BC Managed Care – PPO | Admitting: Physical Therapy

## 2021-10-30 ENCOUNTER — Encounter: Payer: Self-pay | Admitting: Rehabilitative and Restorative Service Providers"

## 2021-10-30 ENCOUNTER — Ambulatory Visit
Payer: BC Managed Care – PPO | Attending: Orthopaedic Surgery | Admitting: Rehabilitative and Restorative Service Providers"

## 2021-10-30 DIAGNOSIS — M25511 Pain in right shoulder: Secondary | ICD-10-CM | POA: Diagnosis not present

## 2021-10-30 DIAGNOSIS — R293 Abnormal posture: Secondary | ICD-10-CM | POA: Insufficient documentation

## 2021-10-30 DIAGNOSIS — R29898 Other symptoms and signs involving the musculoskeletal system: Secondary | ICD-10-CM | POA: Insufficient documentation

## 2021-10-30 DIAGNOSIS — G2589 Other specified extrapyramidal and movement disorders: Secondary | ICD-10-CM | POA: Insufficient documentation

## 2021-10-30 NOTE — Therapy (Signed)
OUTPATIENT PHYSICAL THERAPY TREATMENT   Patient Name: Chelsea [name] MRN: [id number] DOB:05-31-1985, 36 y.o., female Today's Date: 10/30/2021   PT End of Session - 10/30/21 1442     Visit Number 6    Number of Visits 12    Date for PT Re-Evaluation 11/15/21    PT Start Time 1442    PT Stop Time 1528    PT Time Calculation (min) 46 min              Past Medical History:  Diagnosis Date   Depression    Endometrial polyp    Fibromyalgia    History of recurrent UTIs    Hypertension    Infertility, anovulation    Myofascial muscle pain    OA (osteoarthritis) of knee    BILATERAL   PCOS (polycystic ovarian syndrome)    PONV (postoperative nausea and vomiting)    Seasonal allergic rhinitis    Past Surgical History:  Procedure Laterality Date   CHOLECYSTECTOMY N/A 07/11/2021   Procedure: LAPAROSCOPIC CHOLECYSTECTOMY WITH INTRAOPERATIVE CHOLANGIOGRAM;  Surgeon: Manus Rudd, MD;  Location: [facility];  Service: General;  Laterality: N/A;   HYSTEROSCOPY WITH D & C N/A 09/07/2014   Procedure:  DILATATION AND CURETTAGE /HYSTEROSCOPY/POLYPECTOMY;  Surgeon: Fermin Schwab, MD;  Location: [facility];  Service: Gynecology;  Laterality: N/A;   HYSTEROSCOPY WITH D & C N/A 07/25/2021   Procedure: DILATATION AND CURETTAGE /HYSTEROSCOPY;  Surgeon: Jerene Bears, MD;  Location: [facility];  Service: Gynecology;  Laterality: N/A;   I & D RIGHT FOOT  1998   LAPAROSCOPIC GASTRIC SLEEVE RESECTION N/A 10/20/2017   Procedure: LAPAROSCOPIC GASTRIC SLEEVE RESECTION, UPPER ENDO, ERAS Pathway;  Surgeon: Berna Bue, MD;  Location: [facility];  Service: General;  Laterality: N/A;   POLYPECTOMY     uterine   TONSILLECTOMY  2001   WISDOM TOOTH EXTRACTION  2004   Patient Active Problem List   Diagnosis Date Noted   Simple endometrial hyperplasia    Acute calculous cholecystitis 07/11/2021   Acute cholecystitis due to biliary calculus 07/11/2021   Right ankle  sprain 07/19/2020   Chronic pain syndrome 05/30/2020   Impingement syndrome of both shoulders 08/17/2019   Primary insomnia 05/11/2019   Paronychia of second toe, left 01/06/2019   Mixed hypercholesterolemia and hypertriglyceridemia 11/13/2018   Orgasmic headache 11/11/2018   Diet-controlled diabetes mellitus (HCC) 04/25/2017   Low back pain 08/24/2015   Intractable migraine without aura and with status migrainosus 06/24/2014   Myofascial pain syndrome with depression 02/08/2014   Venous stasis dermatitis 01/25/2014   Right hip pain 12/13/2013   Metatarsalgia of both feet 10/18/2013   Right knee pain 02/13/2012   Essential hypertension, benign 11/25/2008   POLYCYSTIC OVARIAN DISEASE 10/26/2008   History of gastric sleeve 10/26/2008    PCP: Dr Rodney Langton  REFERRING PROVIDER: Dr Ramond Marrow   REFERRING DIAG: Arthroscopy Rt shoulder    THERAPY DIAG:  Acute pain of right shoulder  Other symptoms and signs involving the musculoskeletal system  Abnormal posture  Scapular dyskinesis  Rationale for Evaluation and Treatment Rehabilitation  ONSET DATE: 10/01/21  SUBJECTIVE:              10/30/21 - Pt states that she COVID last week and did not feel like doing any exercises. Now she has "tight and some pain" sometimes spikes to an 8/10. She states she has been working on exercises at home - gradually increasing her exercises but has  not done her pulley yet. MD appt 11/06/21                                                                                                                                                                                   SUBJECTIVE STATEMENT: Evaluation: Patient reports that she has had ongoing bilat shoulder pain worsening in the past couple years. She had arthroscopic surgery 10/01/21 for debridement of Rt shoulder.   PERTINENT HISTORY: History of recurrent bilat shoulder pain for several years with injections Rt shoulder 3/22 and Lt 4/22. History of  fibromyalgia; HTN  PAIN:  Are you having pain?  3-4/10 with certain movements Tight with "spike of pain with certain movements" Worse with certain movements 8/10 Better with rest  PRECAUTIONS: None  FALLS:  Has patient fallen in last 6 months? No  LIVING ENVIRONMENT: Lives with: parents  Lives in: House/apartment Has following equipment at home: shower chair, Shower bench, and Grab bars  OCCUPATION: Not working due to fibromyalgia pain   PLOF: Independent  PATIENT GOALS be able to use arm again and not hurt as much in the shoulder; wash her own hair   OBJECTIVE:   UPPER EXTREMITY ROM:  Rt/Lt UE WFLs' limited end ranges shoulder elevation   Active ROM Right eval Right  10/30/21 Left eval  Shoulder extension  54 50  Shoulder flexion  152 155  Shoulder abduction  141 156  Shoulder adduction  90 deg at 90 deg shoulder abduction/90 deg elbow flexion 90 at 90 deg shd abd/elbow 90 deg flex  Shoulder internal rotation     Shoulder external rotation     Elbow flexion     Elbow extension     Wrist flexion     Wrist extension     Wrist ulnar deviation     Wrist radial deviation     Wrist pronation     Wrist supination     (Blank rows = not tested)  UPPER EXTREMITY MMT: not tested; Lt UE WFL's   MMT Right eval Left eval  Shoulder flexion    Shoulder extension    Shoulder abduction    Shoulder adduction    Shoulder internal rotation    Shoulder external rotation    Middle trapezius    Lower trapezius    Elbow flexion    Elbow extension    Wrist flexion    Wrist extension    Wrist ulnar deviation    Wrist radial deviation    Wrist pronation    Wrist supination    Grip strength (lbs)    (Blank rows = not tested)   PALPATION:  Muscular tightness noted Rt > Lt pecs;  biceps; upper trap; leveator; teres   TODAY'S TREATMENT:   10/30/21 Therex Pulley flexion, scaption and horiz abd/add all x 10 Finger ladder x 3 - to 30  Row green TB 3 sec hold x 10          ER green TB isometric step back x 5 sec hold x 10 IR green TB isometric step back x 5 sec hold x 10 Shld extension green TB x 10 Bilat shoulder ER with red TB x 10 reps, noodle behind back D2 flexion Red TB x 10 bilat with noodle behind back Bilat shoulder flexion noodle behind back x 10 Upper trap stretch 3 x 15 sec bilat IR stretch with strap 30 sec x 3 reps   Manual STM Rt anterior chest, UT, Rt anterior shoulder PROM Rt shoulder   10/18/21 Therapeutic exercises; PROM and stretching by PT.  Pulley flexion x 10; scaption x 10; horiz ab/ad x 10  Scap squeeze 10 sec x 10 noodle along spine  ER with cane 10 sec x 5 reps  Extension with cane 5 sec x 5 reps   IR with strap x 5 10 sec hold  Wall slide with washcloth x 10  Row green TB isometric step back x 5 sec hold x 10  ER green TB isometric step back x 5 sec hold x 10  IR green TB isometric step back x 5 sec hold x 10 reps  Shoulder extension green x 10  Row green TB x 10  ER green TB x 10 IR green TB x 10 Scap squeeze supine 10 sec x 10  Supine AROM shoulder flexion x 3  Upper trap stretch 15 sec x 3 reps sitting    Manual:  STM Rt pecs/anterior shoulder PROM Rt shoulder flex; scaption; ER/IR in scapular plane; extension  Scar massage for score sites     PATIENT EDUCATION: Education details: POC; HEP; ice for pain management; postural correction Person educated: Patient Education method: Explanation, Demonstration, Tactile cues, Verbal cues, and Handouts Education comprehension: verbalized understanding, returned demonstration, verbal cues required, tactile cues required, and needs further education   HOME EXERCISE PROGRAM:  Access Code: [email or web address] URL: [email or web address] Date: 10/15/2021 Prepared by: Corlis Leak  Exercises - Seated Scapular Retraction  - 2 x daily - 7 x weekly - 1-2 sets - 10 reps - 10 sec  hold - Circular Shoulder Pendulum with Table Support  - 3-4 x daily - 7 x weekly - 1  sets - 20-30 reps - Standing Shoulder and Trunk Flexion at Table  - 2 x daily - 7 x weekly - 1 sets - 5 reps - 5 sec  hold - Seated Shoulder Flexion Towel Slide at Table Top Full Range of Motion  - 2 x daily - 7 x weekly - 1 sets - 5-10 reps - 10sec  hold - Seated Shoulder External Rotation AAROM with Cane and Hand in Neutral  - 2 x daily - 7 x weekly - 1 sets - 5-10 reps - 5-10 sec  hold - Standing Backward Shoulder Rolls  - 2 x daily - 7 x weekly - 1 sets - 10 reps - 1-2 sec  hold - Standing Shoulder Extension with Dowel  - 2 x daily - 7 x weekly - 1 sets - 5-10 reps - 5-10 sec  hold - Supine Diaphragmatic Breathing  - 2 x daily - 7 x weekly - 1 sets - 10 reps - 4-6 sec  hold - Standing Shoulder W  at Wall  - 2 x daily - 7 x weekly - 1 sets - 10 reps - 10 sec  hold - Standing Shoulder Internal Rotation Stretch with Towel  - 2 x daily - 7 x weekly - 1 sets - 3-5 reps - 15-20 sec  hold - Shoulder Flexion Wall Slide with Towel  - 2 x daily - 7 x weekly - 1 sets - 5-10 reps - 10 sec  hold - Seated Shoulder Flexion AAROM with Pulley Behind  - 2 x daily - 7 x weekly - 1 sets - 10 reps - 10 sec  hold - Seated Shoulder Scaption AAROM with Pulley at Side  - 2 x daily - 7 x weekly - 1 sets - 10 reps - 10sec  hold - Standing Shoulder Row Reactive Isometric  - 2 x daily - 7 x weekly - 1 sets - 10 reps - 20-30 sec  hold - Shoulder External Rotation Reactive Isometrics  - 1 x daily - 7 x weekly - 1 sets - 5-10 reps - 10-30 sec  hold - Shoulder Internal Rotation Reactive Isometrics  - 2 x daily - 7 x weekly - 1 sets - 10 reps - 3-5 sec  hold - Standing Bilateral Low Shoulder Row with Anchored Resistance  - 2 x daily - 7 x weekly - 1-3 sets - 10 reps - 2-3 sec  hold - Shoulder Extension with Resistance  - 2 x daily - 7 x weekly - 1-3 sets - 10 reps - 2-3 sec  hold - Shoulder External Rotation with Anchored Resistance  - 2 x daily - 7 x weekly - 1-2 sets - 10 reps - 3 sec  hold - Shoulder Internal Rotation with  Resistance  - 2 x daily - 7 x weekly - 2 sets - 10 reps - 3 sec  hold - Drawing Bow  - 1 x daily - 7 x weekly - 1 sets - 10 reps - 3 sec  hold - Upper Trapezius Stretch  - 2 x daily - 7 x weekly - 1 sets - 3 reps - 10-15 sec  hold  Patient Education - Scar Massage  ASSESSMENT:  CLINICAL IMPRESSION:  10/30/21 - Patient was unable to come in for PT or do her exercises at home due to COVID. She reports that she had no energy to do any exercises. Tolerated previous exercise program today. Encouraged patient to work to begin exercise program consistently again now that she is feeling better. Working gradually towards goals  Evaluation Patient is a 36 y.o. female who was seen today for physical therapy evaluation and treatment for bilat shoulder pain of a recurrent nature now s/p Rt shoulder arthroscopy for debridement. Patient has a history of bilat shoulder pain over the past several years with symptoms treated with medications and injections. She has a history of fibromyalgia and chronic back and neck pain. Patient is sedentary, not working at this time. She presents with Rt UE in sling with post op dressing. She has poor posture and alignment; limited ROM/mobility Rt UE; generalized weakness; decreased functional activity level; pain. Patient will benefit from PT to address problems identified.       GOALS: Goals reviewed with patient? Yes   LONG TERM GOALS:   Decrease pain in bilat UE's by 50-75% allowing patient to return to all normal functional activities  Baseline:  Goal status: INITIAL  2.  Improve posture and alignment with patient to demonstrate improved upright posture with posterior  shoulder girdle musculature engaged  Baseline:  Goal status: INITIAL  3.  4/5 to 5/5 strength Rt UE  Baseline:  Goal status: INITIAL  4.  AROM Rt shoulder to =/> than AROM Lt shoulder  Baseline:  Goal status: INITIAL  5.  Independent in HEP (including aquatic program as  indicated) Baseline:  Goal status: INITIAL  6.  Improve functional limitation score to  Baseline:  Goal status: INITIAL   PLAN: PT FREQUENCY: 2x/week  PT DURATION: 6 weeks  PLANNED INTERVENTIONS: Therapeutic exercises, Therapeutic activity, Neuromuscular re-education, Patient/Family education, Self Care, Joint mobilization, Aquatic Therapy, Dry Needling, Electrical stimulation, Cryotherapy, Moist heat, Taping, Vasopneumatic device, Ultrasound, Manual therapy, and Re-evaluation  PLAN FOR NEXT SESSION: review HEP; progress with AAROM to AROM and strengthening all planes as patient tolerates; postural strengthening; manual work and modalities as indicated    Darsi Tien Rober Minion, PT, MPH  10/30/2021, 3:24 PM

## 2021-11-01 ENCOUNTER — Ambulatory Visit: Payer: BC Managed Care – PPO | Admitting: Rehabilitative and Restorative Service Providers"

## 2021-11-01 ENCOUNTER — Encounter: Payer: Self-pay | Admitting: Rehabilitative and Restorative Service Providers"

## 2021-11-01 DIAGNOSIS — M25511 Pain in right shoulder: Secondary | ICD-10-CM

## 2021-11-01 DIAGNOSIS — G2589 Other specified extrapyramidal and movement disorders: Secondary | ICD-10-CM | POA: Diagnosis not present

## 2021-11-01 DIAGNOSIS — R29898 Other symptoms and signs involving the musculoskeletal system: Secondary | ICD-10-CM

## 2021-11-01 DIAGNOSIS — R293 Abnormal posture: Secondary | ICD-10-CM

## 2021-11-01 NOTE — Therapy (Signed)
OUTPATIENT PHYSICAL THERAPY TREATMENT   Patient Name: Chelsea [name] MRN: [id number] DOB:1985-03-21, 36 y.o., female Today's Date: 11/01/2021   PT End of Session - 11/01/21 1451     Visit Number 7    Number of Visits 12    Date for PT Re-Evaluation 11/15/21    PT Start Time 1446    PT Stop Time 1530    PT Time Calculation (min) 44 min    Activity Tolerance Patient tolerated treatment well              Past Medical History:  Diagnosis Date   Depression    Endometrial polyp    Fibromyalgia    History of recurrent UTIs    Hypertension    Infertility, anovulation    Myofascial muscle pain    OA (osteoarthritis) of knee    BILATERAL   PCOS (polycystic ovarian syndrome)    PONV (postoperative nausea and vomiting)    Seasonal allergic rhinitis    Past Surgical History:  Procedure Laterality Date   CHOLECYSTECTOMY N/A 07/11/2021   Procedure: LAPAROSCOPIC CHOLECYSTECTOMY WITH INTRAOPERATIVE CHOLANGIOGRAM;  Surgeon: Manus Rudd, MD;  Location: [facility];  Service: General;  Laterality: N/A;   HYSTEROSCOPY WITH D & C N/A 09/07/2014   Procedure:  DILATATION AND CURETTAGE /HYSTEROSCOPY/POLYPECTOMY;  Surgeon: Fermin Schwab, MD;  Location: [facility];  Service: Gynecology;  Laterality: N/A;   HYSTEROSCOPY WITH D & C N/A 07/25/2021   Procedure: DILATATION AND CURETTAGE /HYSTEROSCOPY;  Surgeon: Jerene Bears, MD;  Location: [facility];  Service: Gynecology;  Laterality: N/A;   I & D RIGHT FOOT  1998   LAPAROSCOPIC GASTRIC SLEEVE RESECTION N/A 10/20/2017   Procedure: LAPAROSCOPIC GASTRIC SLEEVE RESECTION, UPPER ENDO, ERAS Pathway;  Surgeon: Berna Bue, MD;  Location: [facility];  Service: General;  Laterality: N/A;   POLYPECTOMY     uterine   TONSILLECTOMY  2001   WISDOM TOOTH EXTRACTION  2004   Patient Active Problem List   Diagnosis Date Noted   Simple endometrial hyperplasia    Acute calculous cholecystitis 07/11/2021   Acute  cholecystitis due to biliary calculus 07/11/2021   Right ankle sprain 07/19/2020   Chronic pain syndrome 05/30/2020   Impingement syndrome of both shoulders 08/17/2019   Primary insomnia 05/11/2019   Paronychia of second toe, left 01/06/2019   Mixed hypercholesterolemia and hypertriglyceridemia 11/13/2018   Orgasmic headache 11/11/2018   Diet-controlled diabetes mellitus (HCC) 04/25/2017   Low back pain 08/24/2015   Intractable migraine without aura and with status migrainosus 06/24/2014   Myofascial pain syndrome with depression 02/08/2014   Venous stasis dermatitis 01/25/2014   Right hip pain 12/13/2013   Metatarsalgia of both feet 10/18/2013   Right knee pain 02/13/2012   Essential hypertension, benign 11/25/2008   POLYCYSTIC OVARIAN DISEASE 10/26/2008   History of gastric sleeve 10/26/2008    PCP: Dr Rodney Langton  REFERRING PROVIDER: Dr Ramond Marrow   REFERRING DIAG: Arthroscopy Rt shoulder    THERAPY DIAG:  Acute pain of right shoulder  Other symptoms and signs involving the musculoskeletal system  Abnormal posture  Scapular dyskinesis  Rationale for Evaluation and Treatment Rehabilitation  ONSET DATE: 10/01/21  SUBJECTIVE:              11/01/21 - Pt states that she COVID last week but she has been doing more of her exercises - not as active as usual yet. Exercises. She continues to feel "tight and some pain" sometimes spikes with  certain movements. She states she has been working on exercises at home -  MD appt 11/06/21                                                                                                                                                                                   SUBJECTIVE STATEMENT: Evaluation: Patient reports that she has had ongoing bilat shoulder pain worsening in the past couple years. She had arthroscopic surgery 10/01/21 for debridement of Rt shoulder.   PERTINENT HISTORY: History of recurrent bilat shoulder pain for several  years with injections Rt shoulder 3/22 and Lt 4/22. History of fibromyalgia; HTN  PAIN:  Are you having pain?  0/10 with certain movements Tight with "spike of pain with certain movements" Worse with certain movements 8/10 Better with rest  PRECAUTIONS: None  FALLS:  Has patient fallen in last 6 months? No  LIVING ENVIRONMENT: Lives with: parents  Lives in: House/apartment Has following equipment at home: shower chair, Shower bench, and Grab bars  OCCUPATION: Not working due to fibromyalgia pain   PLOF: Independent  PATIENT GOALS be able to use arm again and not hurt as much in the shoulder; wash her own hair   OBJECTIVE:   UPPER EXTREMITY ROM:  Rt/Lt UE WFLs' limited end ranges shoulder elevation   Active ROM Right eval Right  10/30/21 Left eval  Shoulder extension  54 50  Shoulder flexion  152 155  Shoulder abduction  141 156  Shoulder adduction  90 deg at 90 deg shoulder abduction/90 deg elbow flexion 90 at 90 deg shd abd/elbow 90 deg flex  Shoulder internal rotation     Shoulder external rotation     Elbow flexion     Elbow extension     Wrist flexion     Wrist extension     Wrist ulnar deviation     Wrist radial deviation     Wrist pronation     Wrist supination     (Blank rows = not tested)  UPPER EXTREMITY MMT: not tested; Lt UE WFL's   MMT Right eval Left eval  Shoulder flexion    Shoulder extension    Shoulder abduction    Shoulder adduction    Shoulder internal rotation    Shoulder external rotation    Middle trapezius    Lower trapezius    Elbow flexion    Elbow extension    Wrist flexion    Wrist extension    Wrist ulnar deviation    Wrist radial deviation    Wrist pronation    Wrist supination    Grip strength (lbs)    (Blank rows = not tested)  PALPATION:  Muscular tightness noted Rt > Lt pecs; biceps; upper trap; leveator; teres   TODAY'S TREATMENT:   11/01/21 Therex Pulley flexion, scaption and horiz abd/add all x  10 Finger ladder x 3 - to 30  Row green TB 3 sec hold x 10         ER blue TB isometric step back x 5 sec hold x 10 IR blue TB isometric step back x 5 sec hold x 10 Shld extension blue TB x 10 Bilat shoulder ER with red TB x 10 reps, noodle behind back Bilat shoulder flexion stepping under swiss ball 1 min x 3  Bouncing swiss ball on wall 1 min x 2  Upper trap stretch 3 x 15 sec bilat IR stretch with strap 30 sec x 3 reps  Supine T on foam roll to stretch anterior shoulder ~ 2 -3 min   Manual STM Rt anterior chest, UT, Rt anterior shoulder PROM Rt shoulder   PATIENT EDUCATION: Education details: POC; HEP; ice for pain management; postural correction Person educated: Patient Education method: Explanation, Demonstration, Tactile cues, Verbal cues, and Handouts Education comprehension: verbalized understanding, returned demonstration, verbal cues required, tactile cues required, and needs further education   HOME EXERCISE PROGRAM:   Access Code: [email or web address] URL: [email or web address] Date: 11/01/2021 Prepared by: Corlis Leak  Exercises - Seated Scapular Retraction  - 2 x daily - 7 x weekly - 1-2 sets - 10 reps - 10 sec  hold - Circular Shoulder Pendulum with Table Support  - 3-4 x daily - 7 x weekly - 1 sets - 20-30 reps - Standing Shoulder and Trunk Flexion at Table  - 2 x daily - 7 x weekly - 1 sets - 5 reps - 5 sec  hold - Seated Shoulder Flexion Towel Slide at Table Top Full Range of Motion  - 2 x daily - 7 x weekly - 1 sets - 5-10 reps - 10sec  hold - Seated Shoulder External Rotation AAROM with Cane and Hand in Neutral  - 2 x daily - 7 x weekly - 1 sets - 5-10 reps - 5-10 sec  hold - Standing Backward Shoulder Rolls  - 2 x daily - 7 x weekly - 1 sets - 10 reps - 1-2 sec  hold - Standing Shoulder Extension with Dowel  - 2 x daily - 7 x weekly - 1 sets - 5-10 reps - 5-10 sec  hold - Supine Diaphragmatic Breathing  - 2 x daily - 7 x weekly - 1 sets - 10 reps - 4-6  sec  hold - Standing Shoulder W at Wall  - 2 x daily - 7 x weekly - 1 sets - 10 reps - 10 sec  hold - Standing Shoulder Internal Rotation Stretch with Towel  - 2 x daily - 7 x weekly - 1 sets - 3-5 reps - 15-20 sec  hold - Shoulder Flexion Wall Slide with Towel  - 2 x daily - 7 x weekly - 1 sets - 5-10 reps - 10 sec  hold - Seated Shoulder Flexion AAROM with Pulley Behind  - 2 x daily - 7 x weekly - 1 sets - 10 reps - 10 sec  hold - Seated Shoulder Scaption AAROM with Pulley at Side  - 2 x daily - 7 x weekly - 1 sets - 10 reps - 10sec  hold - Standing Shoulder Row Reactive Isometric  - 2 x daily - 7 x weekly - 1 sets - 10 reps -  20-30 sec  hold - Shoulder External Rotation Reactive Isometrics  - 1 x daily - 7 x weekly - 1 sets - 5-10 reps - 10-30 sec  hold - Shoulder Internal Rotation Reactive Isometrics  - 2 x daily - 7 x weekly - 1 sets - 10 reps - 3-5 sec  hold - Standing Bilateral Low Shoulder Row with Anchored Resistance  - 2 x daily - 7 x weekly - 1-3 sets - 10 reps - 2-3 sec  hold - Shoulder Extension with Resistance  - 2 x daily - 7 x weekly - 1-3 sets - 10 reps - 2-3 sec  hold - Shoulder External Rotation with Anchored Resistance  - 2 x daily - 7 x weekly - 1-2 sets - 10 reps - 3 sec  hold - Shoulder Internal Rotation with Resistance  - 2 x daily - 7 x weekly - 2 sets - 10 reps - 3 sec  hold - Drawing Bow  - 1 x daily - 7 x weekly - 1 sets - 10 reps - 3 sec  hold - Upper Trapezius Stretch  - 2 x daily - 7 x weekly - 1 sets - 3 reps - 10-15 sec  hold - Anti-Rotation Lateral Stepping with Press  - 2 x daily - 7 x weekly - 1-2 sets - 10 reps - 2-3 sec  hold - Standing Lat Pull Down with Resistance - Elbows Bent  - 2 x daily - 7 x weekly - 1 sets - 10 reps - 3 sec  hold - Isometric Shoulder External Rotation in Abduction with Ball at Wall  - 1 x daily - 7 x weekly - 1-2 min hold - Supine Chest Stretch on Foam Roll  - 2 x daily - 7 x weekly - 1 sets - 1 reps - 2-5 min  sec  hold  Patient  Education - Scar Massage  ASSESSMENT:  CLINICAL IMPRESSION:  11/01/21 - Patient was unable to come in for PT last week or do her exercises at home due to COVID. She reports that she had a little more energy to do exercises since last visit. Tolerated exercise program today. Encouraged patient to work to begin exercise program consistently again now that she is feeling better. Working gradually towards goals  Evaluation Patient is a 36 y.o. female who was seen today for physical therapy evaluation and treatment for bilat shoulder pain of a recurrent nature now s/p Rt shoulder arthroscopy for debridement. Patient has a history of bilat shoulder pain over the past several years with symptoms treated with medications and injections. She has a history of fibromyalgia and chronic back and neck pain. Patient is sedentary, not working at this time. She presents with Rt UE in sling with post op dressing. She has poor posture and alignment; limited ROM/mobility Rt UE; generalized weakness; decreased functional activity level; pain. Patient will benefit from PT to address problems identified.       GOALS: Goals reviewed with patient? Yes   LONG TERM GOALS:   Decrease pain in bilat UE's by 50-75% allowing patient to return to all normal functional activities  Baseline:  Goal status: INITIAL  2.  Improve posture and alignment with patient to demonstrate improved upright posture with posterior shoulder girdle musculature engaged  Baseline:  Goal status: INITIAL  3.  4/5 to 5/5 strength Rt UE  Baseline:  Goal status: INITIAL  4.  AROM Rt shoulder to =/> than AROM Lt shoulder  Baseline:  Goal  status: INITIAL  5.  Independent in HEP (including aquatic program as indicated) Baseline:  Goal status: INITIAL  6.  Improve functional limitation score to  Baseline:  Goal status: INITIAL   PLAN: PT FREQUENCY: 2x/week  PT DURATION: 6 weeks  PLANNED INTERVENTIONS: Therapeutic exercises,  Therapeutic activity, Neuromuscular re-education, Patient/Family education, Self Care, Joint mobilization, Aquatic Therapy, Dry Needling, Electrical stimulation, Cryotherapy, Moist heat, Taping, Vasopneumatic device, Ultrasound, Manual therapy, and Re-evaluation  PLAN FOR NEXT SESSION: review HEP; progress with AAROM to AROM and strengthening all planes as patient tolerates; postural strengthening; manual work and modalities as indicated    Carmie Lanpher Rober Minion, PT, MPH  11/01/2021, 3:28 PM

## 2021-11-05 ENCOUNTER — Ambulatory Visit: Payer: BC Managed Care – PPO | Admitting: Rehabilitative and Restorative Service Providers"

## 2021-11-05 ENCOUNTER — Encounter: Payer: Self-pay | Admitting: Rehabilitative and Restorative Service Providers"

## 2021-11-05 DIAGNOSIS — G2589 Other specified extrapyramidal and movement disorders: Secondary | ICD-10-CM | POA: Diagnosis not present

## 2021-11-05 DIAGNOSIS — M25511 Pain in right shoulder: Secondary | ICD-10-CM

## 2021-11-05 DIAGNOSIS — R293 Abnormal posture: Secondary | ICD-10-CM

## 2021-11-05 DIAGNOSIS — R29898 Other symptoms and signs involving the musculoskeletal system: Secondary | ICD-10-CM

## 2021-11-05 NOTE — Therapy (Signed)
OUTPATIENT PHYSICAL THERAPY TREATMENT   Patient Name: Chelsea [name] MRN: [id number] DOB:[birth year], 36 y.o., female Today's Date: 11/05/2021   PT End of Session - 11/05/21 1316     Visit Number 8    Number of Visits 12    Date for PT Re-Evaluation 11/15/21    PT Start Time 1316    PT Stop Time 1401    PT Time Calculation (min) 45 min    Activity Tolerance Patient tolerated treatment well              Past Medical History:  Diagnosis Date   Depression    Endometrial polyp    Fibromyalgia    History of recurrent UTIs    Hypertension    Infertility, anovulation    Myofascial muscle pain    OA (osteoarthritis) of knee    BILATERAL   PCOS (polycystic ovarian syndrome)    PONV (postoperative nausea and vomiting)    Seasonal allergic rhinitis    Past Surgical History:  Procedure Laterality Date   CHOLECYSTECTOMY N/A 07/11/2021   Procedure: LAPAROSCOPIC CHOLECYSTECTOMY WITH INTRAOPERATIVE CHOLANGIOGRAM;  Surgeon: Manus Rudd, MD;  Location: [facility];  Service: General;  Laterality: N/A;   HYSTEROSCOPY WITH D & C N/A 09/07/2014   Procedure:  DILATATION AND CURETTAGE /HYSTEROSCOPY/POLYPECTOMY;  Surgeon: Fermin Schwab, MD;  Location: [facility];  Service: Gynecology;  Laterality: N/A;   HYSTEROSCOPY WITH D & C N/A 07/25/2021   Procedure: DILATATION AND CURETTAGE /HYSTEROSCOPY;  Surgeon: Jerene Bears, MD;  Location: [facility];  Service: Gynecology;  Laterality: N/A;   I & D RIGHT FOOT  1998   LAPAROSCOPIC GASTRIC SLEEVE RESECTION N/A 10/20/2017   Procedure: LAPAROSCOPIC GASTRIC SLEEVE RESECTION, UPPER ENDO, ERAS Pathway;  Surgeon: Berna Bue, MD;  Location: [facility];  Service: General;  Laterality: N/A;   POLYPECTOMY     uterine   TONSILLECTOMY  2001   WISDOM TOOTH EXTRACTION  2004   Patient Active Problem List   Diagnosis Date Noted   Simple endometrial hyperplasia    Acute calculous cholecystitis 07/11/2021   Acute  cholecystitis due to biliary calculus 07/11/2021   Right ankle sprain 07/19/2020   Chronic pain syndrome 05/30/2020   Impingement syndrome of both shoulders 08/17/2019   Primary insomnia 05/11/2019   Paronychia of second toe, left 01/06/2019   Mixed hypercholesterolemia and hypertriglyceridemia 11/13/2018   Orgasmic headache 11/11/2018   Diet-controlled diabetes mellitus (HCC) 04/25/2017   Low back pain 08/24/2015   Intractable migraine without aura and with status migrainosus 06/24/2014   Myofascial pain syndrome with depression 02/08/2014   Venous stasis dermatitis 01/25/2014   Right hip pain 12/13/2013   Metatarsalgia of both feet 10/18/2013   Right knee pain 02/13/2012   Essential hypertension, benign 11/25/2008   POLYCYSTIC OVARIAN DISEASE 10/26/2008   History of gastric sleeve 10/26/2008    PCP: Dr Rodney Langton  REFERRING PROVIDER: Dr Ramond Marrow   REFERRING DIAG: Arthroscopy Rt shoulder    THERAPY DIAG:  Acute pain of right shoulder  Other symptoms and signs involving the musculoskeletal system  Abnormal posture  Scapular dyskinesis  Rationale for Evaluation and Treatment Rehabilitation  ONSET DATE: 10/01/21  SUBJECTIVE:              11/05/21 - Pt states that she has been doing more of her exercises. She continues to feel "tight and some pain" sometimes spikes with certain movements. She states she has been working on exercises at home -  MD appt 11/06/21                                                                                                                                                                                   SUBJECTIVE STATEMENT: Evaluation: Patient reports that she has had ongoing bilat shoulder pain worsening in the past couple years. She had arthroscopic surgery 10/01/21 for debridement of Rt shoulder.   PERTINENT HISTORY: History of recurrent bilat shoulder pain for several years with injections Rt shoulder 3/22 and Lt 4/22. History of  fibromyalgia; HTN  PAIN:  11/05/21: Are you having pain?  0/10 with certain movements Tight with "spike of pain with certain movements" Worse with certain movements 8/10 Better with rest  PRECAUTIONS: None  FALLS:  Has patient fallen in last 6 months? No  LIVING ENVIRONMENT: Lives with: parents  Lives in: House/apartment Has following equipment at home: shower chair, Shower bench, and Grab bars  OCCUPATION: Not working due to fibromyalgia pain   PLOF: Independent  PATIENT GOALS be able to use arm again and not hurt as much in the shoulder; wash her own hair   OBJECTIVE:   UPPER EXTREMITY ROM:  Rt/Lt UE WFLs' limited end ranges shoulder elevation   Active ROM Right eval Right  10/30/21 Right 11/05/21 Left eval  Shoulder extension  54 54 50  Shoulder flexion  152 150 155  Shoulder abduction  141 155 156  Shoulder adduction  90 deg at 90 deg shoulder abduction/90 deg elbow flexion 90 deg at 90 deg shoulder abduction/90 deg elbow flexion 90 at 90 deg shd abd/elbow 90 deg flex  Shoulder internal rotation      Shoulder external rotation      Elbow flexion      Elbow extension      Wrist flexion      Wrist extension      Wrist ulnar deviation      Wrist radial deviation      Wrist pronation      Wrist supination      (Blank rows = not tested)  UPPER EXTREMITY MMT: not tested; Lt UE WFL's   MMT Right 11/05/21 Left 11/05/21  Shoulder flexion 4+/5 5/5  Shoulder extension 5/5 5/5  Shoulder abduction 5-/5 5/5  Shoulder adduction    Shoulder internal rotation 4+/5 5/5  Shoulder external rotation 4+/5 5/5  Middle trapezius 4/5 5/5  Lower trapezius 4/5 5/5                                      (Blank rows = not tested)  PALPATION:  Muscular tightness noted Rt > Lt pecs; biceps; upper trap; leveator; teres   TODAY'S TREATMENT:  11/05/21 Therex Pulley flexion, scaption and horiz abd/add all x 10 Finger ladder x 3 - to 30  Shoulder adduction Rt blue TB 10 x  2 sets  Row blue TB 3 sec hold x 10         ER blue TB 3 sec hold x 10 IR blue TB 3 sec hold x 10 Shld extension blue TB x 10 Bilat shoulder ER with red TB x 10 reps Bilat shoulder flexion stepping under swiss ball 30 sec x 3  Bouncing swiss ball on wall 1 min x 2  Upper trap stretch 3 x 15 sec bilat IR stretch with strap 30 sec x 3 reps  Antirotation blue TB x 10 each side   Manual STM Rt anterior chest, UT, Rt anterior shoulder PROM Rt shoulder   PATIENT EDUCATION: Education details: POC; HEP; ice for pain management; postural correction Person educated: Patient Education method: Explanation, Demonstration, Tactile cues, Verbal cues, and Handouts Education comprehension: verbalized understanding, returned demonstration, verbal cues required, tactile cues required, and needs further education   HOME EXERCISE PROGRAM:  Access Code: [email or web address] URL: [email or web address] Date: 11/05/2021 Prepared by: Corlis Leak  Exercises - Seated Scapular Retraction  - 2 x daily - 7 x weekly - 1-2 sets - 10 reps - 10 sec  hold - Circular Shoulder Pendulum with Table Support  - 3-4 x daily - 7 x weekly - 1 sets - 20-30 reps - Standing Shoulder and Trunk Flexion at Table  - 2 x daily - 7 x weekly - 1 sets - 5 reps - 5 sec  hold - Seated Shoulder Flexion Towel Slide at Table Top Full Range of Motion  - 2 x daily - 7 x weekly - 1 sets - 5-10 reps - 10sec  hold - Seated Shoulder External Rotation AAROM with Cane and Hand in Neutral  - 2 x daily - 7 x weekly - 1 sets - 5-10 reps - 5-10 sec  hold - Standing Backward Shoulder Rolls  - 2 x daily - 7 x weekly - 1 sets - 10 reps - 1-2 sec  hold - Standing Shoulder Extension with Dowel  - 2 x daily - 7 x weekly - 1 sets - 5-10 reps - 5-10 sec  hold - Supine Diaphragmatic Breathing  - 2 x daily - 7 x weekly - 1 sets - 10 reps - 4-6 sec  hold - Standing Shoulder W at Wall  - 2 x daily - 7 x weekly - 1 sets - 10 reps - 10 sec  hold - Standing  Shoulder Internal Rotation Stretch with Towel  - 2 x daily - 7 x weekly - 1 sets - 3-5 reps - 15-20 sec  hold - Shoulder Flexion Wall Slide with Towel  - 2 x daily - 7 x weekly - 1 sets - 5-10 reps - 10 sec  hold - Seated Shoulder Flexion AAROM with Pulley Behind  - 2 x daily - 7 x weekly - 1 sets - 10 reps - 10 sec  hold - Seated Shoulder Scaption AAROM with Pulley at Side  - 2 x daily - 7 x weekly - 1 sets - 10 reps - 10sec  hold - Standing Shoulder Row Reactive Isometric  - 2 x daily - 7 x weekly - 1 sets - 10 reps - 20-30 sec  hold - Shoulder External Rotation  Reactive Isometrics  - 1 x daily - 7 x weekly - 1 sets - 5-10 reps - 10-30 sec  hold - Shoulder Internal Rotation Reactive Isometrics  - 2 x daily - 7 x weekly - 1 sets - 10 reps - 3-5 sec  hold - Standing Bilateral Low Shoulder Row with Anchored Resistance  - 2 x daily - 7 x weekly - 1-3 sets - 10 reps - 2-3 sec  hold - Shoulder Extension with Resistance  - 2 x daily - 7 x weekly - 1-3 sets - 10 reps - 2-3 sec  hold - Shoulder External Rotation with Anchored Resistance  - 2 x daily - 7 x weekly - 1-2 sets - 10 reps - 3 sec  hold - Shoulder Internal Rotation with Resistance  - 2 x daily - 7 x weekly - 2 sets - 10 reps - 3 sec  hold - Drawing Bow  - 1 x daily - 7 x weekly - 1 sets - 10 reps - 3 sec  hold - Upper Trapezius Stretch  - 2 x daily - 7 x weekly - 1 sets - 3 reps - 10-15 sec  hold - Anti-Rotation Lateral Stepping with Press  - 2 x daily - 7 x weekly - 1-2 sets - 10 reps - 2-3 sec  hold - Standing Lat Pull Down with Resistance - Elbows Bent  - 2 x daily - 7 x weekly - 1 sets - 10 reps - 3 sec  hold - Isometric Shoulder External Rotation in Abduction with Ball at Wall  - 1 x daily - 7 x weekly - 1-2 min hold - Supine Chest Stretch on Foam Roll  - 2 x daily - 7 x weekly - 1 sets - 1 reps - 2-5 min  sec  hold - Shoulder Adduction with Anchored Resistance  - 1 x daily - 7 x weekly - 1-3 sets - 10 reps - 2-3 sec  hold - Seated Shoulder  Press Ups Off Table  - 1 x daily - 7 x weekly - 1-3 sets - 10 reps - 3 sec  hold  Patient Education - Scar Massage  ASSESSMENT:  CLINICAL IMPRESSION:  11/05/21 - Patient was unable to come in for PT for a week or do her exercises at home due to COVID. She reports that she had a more energy to do exercises. Tolerated progression exercise program well today including scapular depression/push up block PWB and shoulder adduction with TB. Note some persistent tightness through pecs. Encouraged patient to continue with exercise program consistently now that she is feeling better. Working gradually towards goals  Evaluation Patient is a 36 y.o. female who was seen today for physical therapy evaluation and treatment for bilat shoulder pain of a recurrent nature now s/p Rt shoulder arthroscopy for debridement. Patient has a history of bilat shoulder pain over the past several years with symptoms treated with medications and injections. She has a history of fibromyalgia and chronic back and neck pain. Patient is sedentary, not working at this time. She presents with Rt UE in sling with post op dressing. She has poor posture and alignment; limited ROM/mobility Rt UE; generalized weakness; decreased functional activity level; pain. Patient will benefit from PT to address problems identified.       GOALS: Goals reviewed with patient? Yes   LONG TERM GOALS:   Decrease pain in bilat UE's by 50-75% allowing patient to return to all normal functional activities  Baseline:  Goal  status: INITIAL  2.  Improve posture and alignment with patient to demonstrate improved upright posture with posterior shoulder girdle musculature engaged  Baseline:  Goal status: INITIAL  3.  4/5 to 5/5 strength Rt UE  Baseline:  Goal status: INITIAL  4.  AROM Rt shoulder to =/> than AROM Lt shoulder  Baseline:  Goal status: INITIAL  5.  Independent in HEP (including aquatic program as indicated) Baseline:  Goal  status: INITIAL  6.  Improve functional limitation score to  Baseline:  Goal status: INITIAL   PLAN: PT FREQUENCY: 2x/week  PT DURATION: 6 weeks  PLANNED INTERVENTIONS: Therapeutic exercises, Therapeutic activity, Neuromuscular re-education, Patient/Family education, Self Care, Joint mobilization, Aquatic Therapy, Dry Needling, Electrical stimulation, Cryotherapy, Moist heat, Taping, Vasopneumatic device, Ultrasound, Manual therapy, and Re-evaluation  PLAN FOR NEXT SESSION: review HEP; progress with AAROM to AROM and strengthening all planes as patient tolerates; postural strengthening; manual work and modalities as indicated  - trial of pec stretch at next visit. Note to MD today.    Val Rileselyn P Keeon Zurn, PT, MPH  11/05/2021, 1:58 PM

## 2021-11-07 ENCOUNTER — Ambulatory Visit: Payer: BC Managed Care – PPO | Admitting: Rehabilitative and Restorative Service Providers"

## 2021-11-07 ENCOUNTER — Encounter: Payer: Self-pay | Admitting: Rehabilitative and Restorative Service Providers"

## 2021-11-07 DIAGNOSIS — G2589 Other specified extrapyramidal and movement disorders: Secondary | ICD-10-CM | POA: Diagnosis not present

## 2021-11-07 DIAGNOSIS — M25511 Pain in right shoulder: Secondary | ICD-10-CM

## 2021-11-07 DIAGNOSIS — R293 Abnormal posture: Secondary | ICD-10-CM | POA: Diagnosis not present

## 2021-11-07 DIAGNOSIS — R29898 Other symptoms and signs involving the musculoskeletal system: Secondary | ICD-10-CM | POA: Diagnosis not present

## 2021-11-07 DIAGNOSIS — Z319 Encounter for procreative management, unspecified: Secondary | ICD-10-CM | POA: Diagnosis not present

## 2021-11-07 DIAGNOSIS — Q950 Balanced translocation and insertion in normal individual: Secondary | ICD-10-CM | POA: Diagnosis not present

## 2021-11-07 NOTE — Therapy (Signed)
OUTPATIENT PHYSICAL THERAPY TREATMENT   Patient Name: Chelsea [name] MRN: [id number] DOB:[birth year], 36 y.o., female Today's Date: 11/07/2021   PT End of Session - 11/07/21 1313     Visit Number 9    Number of Visits 12    Date for PT Re-Evaluation 11/15/21    PT Start Time 1313    PT Stop Time 1359    PT Time Calculation (min) 46 min    Activity Tolerance Patient tolerated treatment well              Past Medical History:  Diagnosis Date   Depression    Endometrial polyp    Fibromyalgia    History of recurrent UTIs    Hypertension    Infertility, anovulation    Myofascial muscle pain    OA (osteoarthritis) of knee    BILATERAL   PCOS (polycystic ovarian syndrome)    PONV (postoperative nausea and vomiting)    Seasonal allergic rhinitis    Past Surgical History:  Procedure Laterality Date   CHOLECYSTECTOMY N/A 07/11/2021   Procedure: LAPAROSCOPIC CHOLECYSTECTOMY WITH INTRAOPERATIVE CHOLANGIOGRAM;  Surgeon: Manus Rudd, MD;  Location: [facility];  Service: General;  Laterality: N/A;   HYSTEROSCOPY WITH D & C N/A 09/07/2014   Procedure:  DILATATION AND CURETTAGE /HYSTEROSCOPY/POLYPECTOMY;  Surgeon: Fermin Schwab, MD;  Location: [facility];  Service: Gynecology;  Laterality: N/A;   HYSTEROSCOPY WITH D & C N/A 07/25/2021   Procedure: DILATATION AND CURETTAGE /HYSTEROSCOPY;  Surgeon: Jerene Bears, MD;  Location: [facility];  Service: Gynecology;  Laterality: N/A;   I & D RIGHT FOOT  1998   LAPAROSCOPIC GASTRIC SLEEVE RESECTION N/A 10/20/2017   Procedure: LAPAROSCOPIC GASTRIC SLEEVE RESECTION, UPPER ENDO, ERAS Pathway;  Surgeon: Berna Bue, MD;  Location: [facility];  Service: General;  Laterality: N/A;   POLYPECTOMY     uterine   TONSILLECTOMY  2001   WISDOM TOOTH EXTRACTION  2004   Patient Active Problem List   Diagnosis Date Noted   Simple endometrial hyperplasia    Acute calculous cholecystitis 07/11/2021   Acute  cholecystitis due to biliary calculus 07/11/2021   Right ankle sprain 07/19/2020   Chronic pain syndrome 05/30/2020   Impingement syndrome of both shoulders 08/17/2019   Primary insomnia 05/11/2019   Paronychia of second toe, left 01/06/2019   Mixed hypercholesterolemia and hypertriglyceridemia 11/13/2018   Orgasmic headache 11/11/2018   Diet-controlled diabetes mellitus (HCC) 04/25/2017   Low back pain 08/24/2015   Intractable migraine without aura and with status migrainosus 06/24/2014   Myofascial pain syndrome with depression 02/08/2014   Venous stasis dermatitis 01/25/2014   Right hip pain 12/13/2013   Metatarsalgia of both feet 10/18/2013   Right knee pain 02/13/2012   Essential hypertension, benign 11/25/2008   POLYCYSTIC OVARIAN DISEASE 10/26/2008   History of gastric sleeve 10/26/2008    PCP: Dr Rodney Langton  REFERRING PROVIDER: Dr Ramond Marrow   REFERRING DIAG: Arthroscopy Rt shoulder    THERAPY DIAG:  Acute pain of right shoulder  Other symptoms and signs involving the musculoskeletal system  Abnormal posture  Scapular dyskinesis  Rationale for Evaluation and Treatment Rehabilitation  ONSET DATE: 10/01/21  SUBJECTIVE:              11/07/21 - MD is pleased with progress. OK for her to start working with weights. Pt states that she has been doing her exercises at home. She continues to feel "tight and some pain" sometimes spikes with  certain movements. She states she has been working on exercises at home                                                                                                                                                                                   SUBJECTIVE STATEMENT: Evaluation: Patient reports that she has had ongoing bilat shoulder pain worsening in the past couple years. She had arthroscopic surgery 10/01/21 for debridement of Rt shoulder.   PERTINENT HISTORY: History of recurrent bilat shoulder pain for several years with  injections Rt shoulder 3/22 and Lt 4/22. History of fibromyalgia; HTN  PAIN:  11/07/21: Are you having pain?  0000000 with certain movements Tight with "spike of pain with certain movements" Worse with certain movements 0000000 Better with rest  PRECAUTIONS: None  FALLS:  Has patient fallen in last 6 months? No  LIVING ENVIRONMENT: Lives with: parents  Lives in: House/apartment Has following equipment at home: shower chair, Shower bench, and Grab bars  OCCUPATION: Not working due to fibromyalgia pain   PLOF: Independent  PATIENT GOALS be able to use arm again and not hurt as much in the shoulder; wash her own hair   OBJECTIVE:   UPPER EXTREMITY ROM:  Rt/Lt UE WFLs' limited end ranges shoulder elevation   Active ROM Right eval Right  10/30/21 Right 11/05/21 Left eval  Shoulder extension  54 54 50  Shoulder flexion  152 150 155  Shoulder abduction  141 155 156  Shoulder adduction  90 deg at 90 deg shoulder abduction/90 deg elbow flexion 90 deg at 90 deg shoulder abduction/90 deg elbow flexion 90 at 90 deg shd abd/elbow 90 deg flex  Shoulder internal rotation      Shoulder external rotation      Elbow flexion      Elbow extension      Wrist flexion      Wrist extension      Wrist ulnar deviation      Wrist radial deviation      Wrist pronation      Wrist supination      (Blank rows = not tested)  UPPER EXTREMITY MMT: not tested; Lt UE WFL's   MMT Right 11/05/21 Left 11/05/21  Shoulder flexion 4+/5 5/5  Shoulder extension 5/5 5/5  Shoulder abduction 5-/5 5/5  Shoulder adduction    Shoulder internal rotation 4+/5 5/5  Shoulder external rotation 4+/5 5/5  Middle trapezius 4/5 5/5  Lower trapezius 4/5 5/5                                      (  Blank rows = not tested)   PALPATION:  Muscular tightness noted Rt > Lt pecs; biceps; upper trap; leveator; teres   TODAY'S TREATMENT:  11/07/21 Therex Pulley flexion, scaption and horiz abd/add all x 10 UBE - L3  x 4 min , alt fwd/back each min  Doorway stretch 3 positions 30 sec x 2 each position Scaption 2# wt bilat x 10 Shoulder flexion 2# wt bilat x 10  Overhead press 2# wt bilat x 10  Finger ladder x 3 - to 30  Shoulder adduction Rt blue TB 10 x 2 sets  Row blue TB 3 sec hold x 10         ER blue TB 3 sec hold x 10 IR blue TB 3 sec hold x 10 Shld extension blue TB x 10 Bilat shoulder ER with red TB x 10 reps Bilat shoulder flexion stepping under swiss ball 30 sec x 3  Bouncing swiss ball on wall 1 min x 3  Upper trap stretch 3 x 15 sec bilat IR stretch with strap 30 sec x 3 reps  Antirotation blue TB x 10 each side   Manual STM Rt anterior chest, UT, Rt anterior shoulder PROM Rt shoulder   PATIENT EDUCATION: Education details: POC; HEP; ice for pain management; postural correction Person educated: Patient Education method: Explanation, Demonstration, Tactile cues, Verbal cues, and Handouts Education comprehension: verbalized understanding, returned demonstration, verbal cues required, tactile cues required, and needs further education   HOME EXERCISE PROGRAM:  Access Code: [email or web address] URL: [email or web address] Date: 11/07/2021 Prepared by: Corlis Leak  Exercises - Seated Scapular Retraction  - 2 x daily - 7 x weekly - 1-2 sets - 10 reps - 10 sec  hold - Circular Shoulder Pendulum with Table Support  - 3-4 x daily - 7 x weekly - 1 sets - 20-30 reps - Standing Shoulder and Trunk Flexion at Table  - 2 x daily - 7 x weekly - 1 sets - 5 reps - 5 sec  hold - Seated Shoulder Flexion Towel Slide at Table Top Full Range of Motion  - 2 x daily - 7 x weekly - 1 sets - 5-10 reps - 10sec  hold - Seated Shoulder External Rotation AAROM with Cane and Hand in Neutral  - 2 x daily - 7 x weekly - 1 sets - 5-10 reps - 5-10 sec  hold - Standing Backward Shoulder Rolls  - 2 x daily - 7 x weekly - 1 sets - 10 reps - 1-2 sec  hold - Standing Shoulder Extension with Dowel  - 2 x daily - 7  x weekly - 1 sets - 5-10 reps - 5-10 sec  hold - Supine Diaphragmatic Breathing  - 2 x daily - 7 x weekly - 1 sets - 10 reps - 4-6 sec  hold - Standing Shoulder W at Wall  - 2 x daily - 7 x weekly - 1 sets - 10 reps - 10 sec  hold - Standing Shoulder Internal Rotation Stretch with Towel  - 2 x daily - 7 x weekly - 1 sets - 3-5 reps - 15-20 sec  hold - Shoulder Flexion Wall Slide with Towel  - 2 x daily - 7 x weekly - 1 sets - 5-10 reps - 10 sec  hold - Seated Shoulder Flexion AAROM with Pulley Behind  - 2 x daily - 7 x weekly - 1 sets - 10 reps - 10 sec  hold - Seated Shoulder Scaption AAROM with Pulley  at Side  - 2 x daily - 7 x weekly - 1 sets - 10 reps - 10sec  hold - Standing Shoulder Row Reactive Isometric  - 2 x daily - 7 x weekly - 1 sets - 10 reps - 20-30 sec  hold - Shoulder External Rotation Reactive Isometrics  - 1 x daily - 7 x weekly - 1 sets - 5-10 reps - 10-30 sec  hold - Shoulder Internal Rotation Reactive Isometrics  - 2 x daily - 7 x weekly - 1 sets - 10 reps - 3-5 sec  hold - Standing Bilateral Low Shoulder Row with Anchored Resistance  - 2 x daily - 7 x weekly - 1-3 sets - 10 reps - 2-3 sec  hold - Shoulder Extension with Resistance  - 2 x daily - 7 x weekly - 1-3 sets - 10 reps - 2-3 sec  hold - Shoulder External Rotation with Anchored Resistance  - 2 x daily - 7 x weekly - 1-2 sets - 10 reps - 3 sec  hold - Shoulder Internal Rotation with Resistance  - 2 x daily - 7 x weekly - 2 sets - 10 reps - 3 sec  hold - Drawing Bow  - 1 x daily - 7 x weekly - 1 sets - 10 reps - 3 sec  hold - Upper Trapezius Stretch  - 2 x daily - 7 x weekly - 1 sets - 3 reps - 10-15 sec  hold - Anti-Rotation Lateral Stepping with Press  - 2 x daily - 7 x weekly - 1-2 sets - 10 reps - 2-3 sec  hold - Standing Lat Pull Down with Resistance - Elbows Bent  - 2 x daily - 7 x weekly - 1 sets - 10 reps - 3 sec  hold - Isometric Shoulder External Rotation in Abduction with Ball at Wall  - 1 x daily - 7 x weekly  - 1-2 min hold - Supine Chest Stretch on Foam Roll  - 2 x daily - 7 x weekly - 1 sets - 1 reps - 2-5 min  sec  hold - Shoulder Adduction with Anchored Resistance  - 1 x daily - 7 x weekly - 1-3 sets - 10 reps - 2-3 sec  hold - Seated Shoulder Press Ups Off Table  - 1 x daily - 7 x weekly - 1-3 sets - 10 reps - 3 sec  hold - Doorway Pec Stretch at 60 Degrees Abduction  - 2-3 x daily - 7 x weekly - 1 sets - 3 reps - 30 sec hold - Doorway Pec Stretch at 90 Degrees Abduction  - 2-3 x daily - 7 x weekly - 1 sets - 3 reps - 30 seconds  hold - Doorway Pec Stretch at 120 Degrees Abduction  - 2-3 x daily - 7 x weekly - 1 sets - 3 reps - 30 second hold  hold - Scaption with Dumbbells  - 1 x daily - 7 x weekly - 1-3 sets - 10 reps - 2 sec  hold - Standing Shoulder Flexion to 90 Degrees with Dumbbells  - 1 x daily - 7 x weekly - 1-3 sets - 10 reps - 2 sec  hold - Shoulder Overhead Press in Flexion with Dumbbells  - 1 x daily - 7 x weekly - 1-3 sets - 10 reps - 2 sec  hold  Patient Education - Scar Massage  ASSESSMENT:  CLINICAL IMPRESSION:  11/07/21 - Continued progress with Rt shoulder  rehab. Added resistive exercises with 2# wts today and scapular depression/push up block and shoulder adduction with TB last visit without difficulty. Note some persistent tightness through pecs. Added pec stretch in doorway x 3 positions. Encouraged patient to continue with exercise program consistently now that she is feeling better. Working gradually towards goals  Evaluation Patient is a 36 y.o. female who was seen today for physical therapy evaluation and treatment for bilat shoulder pain of a recurrent nature now s/p Rt shoulder arthroscopy for debridement. Patient has a history of bilat shoulder pain over the past several years with symptoms treated with medications and injections. She has a history of fibromyalgia and chronic back and neck pain. Patient is sedentary, not working at this time. She presents with Rt UE  in sling with post op dressing. She has poor posture and alignment; limited ROM/mobility Rt UE; generalized weakness; decreased functional activity level; pain. Patient will benefit from PT to address problems identified.       GOALS: Goals reviewed with patient? Yes   LONG TERM GOALS:   Decrease pain in bilat UE's by 50-75% allowing patient to return to all normal functional activities  Baseline:  Goal status: INITIAL  2.  Improve posture and alignment with patient to demonstrate improved upright posture with posterior shoulder girdle musculature engaged  Baseline:  Goal status: INITIAL  3.  4/5 to 5/5 strength Rt UE  Baseline:  Goal status: INITIAL  4.  AROM Rt shoulder to =/> than AROM Lt shoulder  Baseline:  Goal status: INITIAL  5.  Independent in HEP (including aquatic program as indicated) Baseline:  Goal status: INITIAL  6.  Improve functional limitation score to  Baseline:  Goal status: INITIAL   PLAN: PT FREQUENCY: 2x/week  PT DURATION: 6 weeks  PLANNED INTERVENTIONS: Therapeutic exercises, Therapeutic activity, Neuromuscular re-education, Patient/Family education, Self Care, Joint mobilization, Aquatic Therapy, Dry Needling, Electrical stimulation, Cryotherapy, Moist heat, Taping, Vasopneumatic device, Ultrasound, Manual therapy, and Re-evaluation  PLAN FOR NEXT SESSION: review HEP; progress with AAROM to AROM and strengthening all planes as patient tolerates; postural strengthening; manual work and modalities as indicated     Samier Jaco Nilda Simmer, PT, MPH  11/07/2021, 1:57 PM

## 2021-11-12 ENCOUNTER — Encounter: Payer: Self-pay | Admitting: Rehabilitative and Restorative Service Providers"

## 2021-11-12 ENCOUNTER — Ambulatory Visit: Payer: BC Managed Care – PPO | Admitting: Rehabilitative and Restorative Service Providers"

## 2021-11-12 DIAGNOSIS — R293 Abnormal posture: Secondary | ICD-10-CM | POA: Diagnosis not present

## 2021-11-12 DIAGNOSIS — G2589 Other specified extrapyramidal and movement disorders: Secondary | ICD-10-CM | POA: Diagnosis not present

## 2021-11-12 DIAGNOSIS — M25511 Pain in right shoulder: Secondary | ICD-10-CM

## 2021-11-12 DIAGNOSIS — R29898 Other symptoms and signs involving the musculoskeletal system: Secondary | ICD-10-CM

## 2021-11-12 NOTE — Therapy (Signed)
OUTPATIENT PHYSICAL THERAPY TREATMENT   Patient Name: Chelsea [name] MRN: [id number] DOB:May 02, 1985, 36 y.o., female Today's Date: 11/12/2021   PT End of Session - 11/12/21 1318     Visit Number 10    Number of Visits 12    Date for PT Re-Evaluation 11/15/21    PT Start Time 1315    PT Stop Time 1400    PT Time Calculation (min) 45 min    Activity Tolerance Patient tolerated treatment well              Past Medical History:  Diagnosis Date   Depression    Endometrial polyp    Fibromyalgia    History of recurrent UTIs    Hypertension    Infertility, anovulation    Myofascial muscle pain    OA (osteoarthritis) of knee    BILATERAL   PCOS (polycystic ovarian syndrome)    PONV (postoperative nausea and vomiting)    Seasonal allergic rhinitis    Past Surgical History:  Procedure Laterality Date   CHOLECYSTECTOMY N/A 07/11/2021   Procedure: LAPAROSCOPIC CHOLECYSTECTOMY WITH INTRAOPERATIVE CHOLANGIOGRAM;  Surgeon: Donnie Mesa, MD;  Location: [facility];  Service: General;  Laterality: N/A;   HYSTEROSCOPY WITH D & C N/A 09/07/2014   Procedure:  DILATATION AND CURETTAGE /HYSTEROSCOPY/POLYPECTOMY;  Surgeon: Governor Specking, MD;  Location: [facility];  Service: Gynecology;  Laterality: N/A;   HYSTEROSCOPY WITH D & C N/A 07/25/2021   Procedure: DILATATION AND CURETTAGE /HYSTEROSCOPY;  Surgeon: Megan Salon, MD;  Location: [facility];  Service: Gynecology;  Laterality: N/A;   I & D RIGHT FOOT  1998   LAPAROSCOPIC GASTRIC SLEEVE RESECTION N/A 10/20/2017   Procedure: LAPAROSCOPIC GASTRIC SLEEVE RESECTION, UPPER ENDO, ERAS Pathway;  Surgeon: Clovis Riley, MD;  Location: [facility];  Service: General;  Laterality: N/A;   POLYPECTOMY     uterine   TONSILLECTOMY  2001   WISDOM TOOTH EXTRACTION  2004   Patient Active Problem List   Diagnosis Date Noted   Simple endometrial hyperplasia    Acute calculous cholecystitis 07/11/2021   Acute  cholecystitis due to biliary calculus 07/11/2021   Right ankle sprain 07/19/2020   Chronic pain syndrome 05/30/2020   Impingement syndrome of both shoulders 08/17/2019   Primary insomnia 05/11/2019   Paronychia of second toe, left 01/06/2019   Mixed hypercholesterolemia and hypertriglyceridemia 11/13/2018   Orgasmic headache 11/11/2018   Diet-controlled diabetes mellitus ([place]) 04/25/2017   Low back pain 08/24/2015   Intractable migraine without aura and with status migrainosus 06/24/2014   Myofascial pain syndrome with depression 02/08/2014   Venous stasis dermatitis 01/25/2014   Right hip pain 12/13/2013   Metatarsalgia of both feet 10/18/2013   Right knee pain 02/13/2012   Essential hypertension, benign 11/25/2008   POLYCYSTIC OVARIAN DISEASE 10/26/2008   History of gastric sleeve 10/26/2008    PCP: Dr Aundria Mems  REFERRING PROVIDER: Dr Ophelia Charter   REFERRING DIAG: Arthroscopy Rt shoulder    THERAPY DIAG:  Acute pain of right shoulder  Other symptoms and signs involving the musculoskeletal system  Abnormal posture  Scapular dyskinesis  Rationale for Evaluation and Treatment Rehabilitation  ONSET DATE: 10/01/21  SUBJECTIVE:              11/12/21 - Pt states that she has been doing her exercises at home. She continues to feel "tight" sometimes spikes with certain movements.  SUBJECTIVE STATEMENT: Evaluation: Patient reports that she has had ongoing bilat shoulder pain worsening in the past couple years. She had arthroscopic surgery 10/01/21 for debridement of Rt shoulder.   PERTINENT HISTORY: History of recurrent bilat shoulder pain for several years with injections Rt shoulder 3/22 and Lt 4/22. History of fibromyalgia; HTN  PAIN:  11/07/21: Are you having pain? 0/10  some discomfort or pain  with certain movements Tight with "spike of pain with certain movements" Worse w/certain movements 0000000 - not as often Better with rest  PRECAUTIONS: None  FALLS:  Has patient fallen in last 6 months? No  LIVING ENVIRONMENT: Lives with: parents  Lives in: House/apartment Has following equipment at home: shower chair, Shower bench, and Grab bars  OCCUPATION: Not working due to fibromyalgia pain   PLOF: Independent  PATIENT GOALS be able to use arm again and not hurt as much in the shoulder; wash her own hair   OBJECTIVE:   UPPER EXTREMITY ROM:  Rt/Lt UE WFLs' limited end ranges shoulder elevation   Active ROM Right eval Right  10/30/21 Right 11/05/21 Left eval  Shoulder extension  54 54 50  Shoulder flexion  152 150 155  Shoulder abduction  141 155 156  Shoulder adduction  90 deg at 90 deg shoulder abduction/90 deg elbow flexion 90 deg at 90 deg shoulder abduction/90 deg elbow flexion 90 at 90 deg shd abd/elbow 90 deg flex  Shoulder internal rotation      Shoulder external rotation      Elbow flexion      Elbow extension      Wrist flexion      Wrist extension      Wrist ulnar deviation      Wrist radial deviation      Wrist pronation      Wrist supination      (Blank rows = not tested)  UPPER EXTREMITY MMT: not tested; Lt UE WFL's   MMT Right 11/05/21 Left 11/05/21  Shoulder flexion 4+/5 5/5  Shoulder extension 5/5 5/5  Shoulder abduction 5-/5 5/5  Shoulder adduction    Shoulder internal rotation 4+/5 5/5  Shoulder external rotation 4+/5 5/5  Middle trapezius 4/5 5/5  Lower trapezius 4/5 5/5                                      (Blank rows = not tested)   PALPATION:  Muscular tightness noted Rt > Lt pecs; biceps; upper trap; leveator; teres   TODAY'S TREATMENT:  11/12/21 Therex Pulley flexion, scaption and horiz abd/add all x 10 UBE - L5 x 4 min , alt fwd/back each min  Scap squeeze 10 sec x 10 w/noodle along spine Overhead press 5# KB x 10  x 2 sets Rt/Lt Bent row 5# KB x 10 x 2 sets  Doorway stretch 3 positions 30 sec x 2 each position Scaption 2# wt bilat x 10 Shoulder flexion 2# wt bilat x 10  Finger ladder x 3 - to 30  Shoulder adduction Rt blue TB 10 x 2 sets  Row blue TB 3 sec hold x 10 x 2 sets        ER blue TB 3 sec hold x 10 x 2 sets IR blue TB 3 sec hold x 10 x 2 sets Shld extension blue TB x 10 Bilat shoulder flexion stepping under swiss ball 30 sec x 3  Bouncing swiss  ball on wall 1 min x 3  Upper trap stretch 3 x 15 sec bilat IR AAROM stretch with strap 30 sec x 3 reps  Antirotation blue TB x 10 each side - (last visit) Bodyblade - Rt slight fwd shd/elbow flex/ext x 1 min x 2 Bodyblade - bilat chest level and up x 1 min x 1    Manual STM Rt anterior chest, UT, Rt anterior shoulder/arm PROM Rt shoulder   PATIENT EDUCATION: Education details: POC; HEP; ice for pain management; postural correction Person educated: Patient Education method: Explanation, Demonstration, Tactile cues, Verbal cues, and Handouts Education comprehension: verbalized understanding, returned demonstration, verbal cues required, tactile cues required, and needs further education   HOME EXERCISE PROGRAM:  Access Code: [email or web address] URL: [email or web address] Date: 11/07/2021 Prepared by: Gillermo Murdoch  Exercises - Seated Scapular Retraction  - 2 x daily - 7 x weekly - 1-2 sets - 10 reps - 10 sec  hold - Circular Shoulder Pendulum with Table Support  - 3-4 x daily - 7 x weekly - 1 sets - 20-30 reps - Standing Shoulder and Trunk Flexion at Table  - 2 x daily - 7 x weekly - 1 sets - 5 reps - 5 sec  hold - Seated Shoulder Flexion Towel Slide at Table Top Full Range of Motion  - 2 x daily - 7 x weekly - 1 sets - 5-10 reps - 10sec  hold - Seated Shoulder External Rotation AAROM with Cane and Hand in Neutral  - 2 x daily - 7 x weekly - 1 sets - 5-10 reps - 5-10 sec  hold - Standing Backward Shoulder Rolls  - 2 x daily - 7 x  weekly - 1 sets - 10 reps - 1-2 sec  hold - Standing Shoulder Extension with Dowel  - 2 x daily - 7 x weekly - 1 sets - 5-10 reps - 5-10 sec  hold - Supine Diaphragmatic Breathing  - 2 x daily - 7 x weekly - 1 sets - 10 reps - 4-6 sec  hold - Standing Shoulder W at Wall  - 2 x daily - 7 x weekly - 1 sets - 10 reps - 10 sec  hold - Standing Shoulder Internal Rotation Stretch with Towel  - 2 x daily - 7 x weekly - 1 sets - 3-5 reps - 15-20 sec  hold - Shoulder Flexion Wall Slide with Towel  - 2 x daily - 7 x weekly - 1 sets - 5-10 reps - 10 sec  hold - Seated Shoulder Flexion AAROM with Pulley Behind  - 2 x daily - 7 x weekly - 1 sets - 10 reps - 10 sec  hold - Seated Shoulder Scaption AAROM with Pulley at Side  - 2 x daily - 7 x weekly - 1 sets - 10 reps - 10sec  hold - Standing Shoulder Row Reactive Isometric  - 2 x daily - 7 x weekly - 1 sets - 10 reps - 20-30 sec  hold - Shoulder External Rotation Reactive Isometrics  - 1 x daily - 7 x weekly - 1 sets - 5-10 reps - 10-30 sec  hold - Shoulder Internal Rotation Reactive Isometrics  - 2 x daily - 7 x weekly - 1 sets - 10 reps - 3-5 sec  hold - Standing Bilateral Low Shoulder Row with Anchored Resistance  - 2 x daily - 7 x weekly - 1-3 sets - 10 reps - 2-3 sec  hold - Shoulder Extension with  Resistance  - 2 x daily - 7 x weekly - 1-3 sets - 10 reps - 2-3 sec  hold - Shoulder External Rotation with Anchored Resistance  - 2 x daily - 7 x weekly - 1-2 sets - 10 reps - 3 sec  hold - Shoulder Internal Rotation with Resistance  - 2 x daily - 7 x weekly - 2 sets - 10 reps - 3 sec  hold - Drawing Bow  - 1 x daily - 7 x weekly - 1 sets - 10 reps - 3 sec  hold - Upper Trapezius Stretch  - 2 x daily - 7 x weekly - 1 sets - 3 reps - 10-15 sec  hold - Anti-Rotation Lateral Stepping with Press  - 2 x daily - 7 x weekly - 1-2 sets - 10 reps - 2-3 sec  hold - Standing Lat Pull Down with Resistance - Elbows Bent  - 2 x daily - 7 x weekly - 1 sets - 10 reps - 3 sec   hold - Isometric Shoulder External Rotation in Abduction with Ball at Wall  - 1 x daily - 7 x weekly - 1-2 min hold - Supine Chest Stretch on Foam Roll  - 2 x daily - 7 x weekly - 1 sets - 1 reps - 2-5 min  sec  hold - Shoulder Adduction with Anchored Resistance  - 1 x daily - 7 x weekly - 1-3 sets - 10 reps - 2-3 sec  hold - Seated Shoulder Press Ups Off Table  - 1 x daily - 7 x weekly - 1-3 sets - 10 reps - 3 sec  hold - Doorway Pec Stretch at 60 Degrees Abduction  - 2-3 x daily - 7 x weekly - 1 sets - 3 reps - 30 sec hold - Doorway Pec Stretch at 90 Degrees Abduction  - 2-3 x daily - 7 x weekly - 1 sets - 3 reps - 30 seconds  hold - Doorway Pec Stretch at 120 Degrees Abduction  - 2-3 x daily - 7 x weekly - 1 sets - 3 reps - 30 second hold  hold - Scaption with Dumbbells  - 1 x daily - 7 x weekly - 1-3 sets - 10 reps - 2 sec  hold - Standing Shoulder Flexion to 90 Degrees with Dumbbells  - 1 x daily - 7 x weekly - 1-3 sets - 10 reps - 2 sec  hold - Shoulder Overhead Press in Flexion with Dumbbells  - 1 x daily - 7 x weekly - 1-3 sets - 10 reps - 2 sec  hold  Patient Education - Scar Massage  ASSESSMENT:  CLINICAL IMPRESSION:  11/12/21 - Continued progress with Rt shoulder rehab. Progressing exercises, resistance, reps as tolerated. Note some persistent tightness through pecs. Encouraged patient to continue with exercise program consistently . Working gradually towards goals  Evaluation Patient is a 36 y.o. female who was seen today for physical therapy evaluation and treatment for bilat shoulder pain of a recurrent nature now s/p Rt shoulder arthroscopy for debridement. Patient has a history of bilat shoulder pain over the past several years with symptoms treated with medications and injections. She has a history of fibromyalgia and chronic back and neck pain. Patient is sedentary, not working at this time. She presents with Rt UE in sling with post op dressing. She has poor posture and  alignment; limited ROM/mobility Rt UE; generalized weakness; decreased functional activity level; pain. Patient will benefit from  PT to address problems identified.       GOALS: Goals reviewed with patient? Yes   LONG TERM GOALS:   Decrease pain in bilat UE's by 50-75% allowing patient to return to all normal functional activities  Baseline:  Goal status: INITIAL  2.  Improve posture and alignment with patient to demonstrate improved upright posture with posterior shoulder girdle musculature engaged  Baseline:  Goal status: INITIAL  3.  4/5 to 5/5 strength Rt UE  Baseline:  Goal status: INITIAL  4.  AROM Rt shoulder to =/> than AROM Lt shoulder  Baseline:  Goal status: INITIAL  5.  Independent in HEP (including aquatic program as indicated) Baseline:  Goal status: INITIAL  6.  Improve functional limitation score to  Baseline:  Goal status: INITIAL   PLAN: PT FREQUENCY: 2x/week  PT DURATION: 6 weeks  PLANNED INTERVENTIONS: Therapeutic exercises, Therapeutic activity, Neuromuscular re-education, Patient/Family education, Self Care, Joint mobilization, Aquatic Therapy, Dry Needling, Electrical stimulation, Cryotherapy, Moist heat, Taping, Vasopneumatic device, Ultrasound, Manual therapy, and Re-evaluation  PLAN FOR NEXT SESSION: review HEP; progress with AAROM to AROM and strengthening all planes as patient tolerates; postural strengthening; manual work and modalities as indicated. Trial of plank     Jetty Berland Nilda Simmer, PT, MPH  11/12/2021, 2:05 PM

## 2021-11-14 ENCOUNTER — Ambulatory Visit: Payer: BC Managed Care – PPO | Admitting: Rehabilitative and Restorative Service Providers"

## 2021-11-14 ENCOUNTER — Encounter: Payer: Self-pay | Admitting: Rehabilitative and Restorative Service Providers"

## 2021-11-14 DIAGNOSIS — R29898 Other symptoms and signs involving the musculoskeletal system: Secondary | ICD-10-CM | POA: Diagnosis not present

## 2021-11-14 DIAGNOSIS — R293 Abnormal posture: Secondary | ICD-10-CM

## 2021-11-14 DIAGNOSIS — M25511 Pain in right shoulder: Secondary | ICD-10-CM | POA: Diagnosis not present

## 2021-11-14 DIAGNOSIS — G2589 Other specified extrapyramidal and movement disorders: Secondary | ICD-10-CM | POA: Diagnosis not present

## 2021-11-14 NOTE — Therapy (Signed)
OUTPATIENT PHYSICAL THERAPY TREATMENT   Patient Name: Chelsea [name] MRN: [id number] DOB:01-30-1986, 36 y.o., female Today's Date: 11/14/2021   PT End of Session - 11/14/21 1317     Visit Number 11    Number of Visits 12    Date for PT Re-Evaluation 11/15/21    PT Start Time 1315    PT Stop Time 1400    PT Time Calculation (min) 45 min    Activity Tolerance Patient tolerated treatment well              Past Medical History:  Diagnosis Date   Depression    Endometrial polyp    Fibromyalgia    History of recurrent UTIs    Hypertension    Infertility, anovulation    Myofascial muscle pain    OA (osteoarthritis) of knee    BILATERAL   PCOS (polycystic ovarian syndrome)    PONV (postoperative nausea and vomiting)    Seasonal allergic rhinitis    Past Surgical History:  Procedure Laterality Date   CHOLECYSTECTOMY N/A 07/11/2021   Procedure: LAPAROSCOPIC CHOLECYSTECTOMY WITH INTRAOPERATIVE CHOLANGIOGRAM;  Surgeon: Donnie Mesa, MD;  Location: [facility];  Service: General;  Laterality: N/A;   HYSTEROSCOPY WITH D & C N/A 09/07/2014   Procedure:  DILATATION AND CURETTAGE /HYSTEROSCOPY/POLYPECTOMY;  Surgeon: Governor Specking, MD;  Location: [facility];  Service: Gynecology;  Laterality: N/A;   HYSTEROSCOPY WITH D & C N/A 07/25/2021   Procedure: DILATATION AND CURETTAGE /HYSTEROSCOPY;  Surgeon: Megan Salon, MD;  Location: [facility];  Service: Gynecology;  Laterality: N/A;   I & D RIGHT FOOT  1998   LAPAROSCOPIC GASTRIC SLEEVE RESECTION N/A 10/20/2017   Procedure: LAPAROSCOPIC GASTRIC SLEEVE RESECTION, UPPER ENDO, ERAS Pathway;  Surgeon: Clovis Riley, MD;  Location: [facility];  Service: General;  Laterality: N/A;   POLYPECTOMY     uterine   TONSILLECTOMY  2001   WISDOM TOOTH EXTRACTION  2004   Patient Active Problem List   Diagnosis Date Noted   Simple endometrial hyperplasia    Acute calculous cholecystitis 07/11/2021   Acute  cholecystitis due to biliary calculus 07/11/2021   Right ankle sprain 07/19/2020   Chronic pain syndrome 05/30/2020   Impingement syndrome of both shoulders 08/17/2019   Primary insomnia 05/11/2019   Paronychia of second toe, left 01/06/2019   Mixed hypercholesterolemia and hypertriglyceridemia 11/13/2018   Orgasmic headache 11/11/2018   Diet-controlled diabetes mellitus ([place]) 04/25/2017   Low back pain 08/24/2015   Intractable migraine without aura and with status migrainosus 06/24/2014   Myofascial pain syndrome with depression 02/08/2014   Venous stasis dermatitis 01/25/2014   Right hip pain 12/13/2013   Metatarsalgia of both feet 10/18/2013   Right knee pain 02/13/2012   Essential hypertension, benign 11/25/2008   POLYCYSTIC OVARIAN DISEASE 10/26/2008   History of gastric sleeve 10/26/2008    PCP: Dr Aundria Mems  REFERRING PROVIDER: Dr Ophelia Charter   REFERRING DIAG: Arthroscopy Rt shoulder    THERAPY DIAG:  Acute pain of right shoulder  Other symptoms and signs involving the musculoskeletal system  Abnormal posture  Scapular dyskinesis  Rationale for Evaluation and Treatment Rehabilitation  ONSET DATE: 10/01/21  SUBJECTIVE:              11/14/21 - Pt states that she has been doing her exercises at home. Shoulder continues to feel "tight" at times and she sometimes has pain spikes with certain movements.  SUBJECTIVE STATEMENT: Evaluation: Patient reports that she has had ongoing bilat shoulder pain worsening in the past couple years. She had arthroscopic surgery 10/01/21 for debridement of Rt shoulder.   PERTINENT HISTORY: History of recurrent bilat shoulder pain for several years with injections Rt shoulder 3/22 and Lt 4/22. History of fibromyalgia; HTN  PAIN:  11/14/21: Are you having pain?  0/10  some discomfort or pain with certain movements Tight with "spike of pain with certain movements" Worse w/certain movements 6-7/10 - not as often Better with rest  PRECAUTIONS: None  FALLS:  Has patient fallen in last 6 months? No  LIVING ENVIRONMENT: Lives with: parents  Lives in: House/apartment Has following equipment at home: shower chair, Shower bench, and Grab bars  OCCUPATION: Not working due to fibromyalgia pain   PLOF: Independent  PATIENT GOALS be able to use arm again and not hurt as much in the shoulder; wash her own hair   OBJECTIVE:   UPPER EXTREMITY ROM:  Rt/Lt UE WFLs' limited end ranges shoulder elevation   Active ROM Right eval Right  10/30/21 Right 11/05/21 Left eval  Shoulder extension  54 54 50  Shoulder flexion  152 150 155  Shoulder abduction  141 155 156  Shoulder adduction  90 deg at 90 deg shoulder abduction/90 deg elbow flexion 90 deg at 90 deg shoulder abduction/90 deg elbow flexion 90 at 90 deg shd abd/elbow 90 deg flex  Shoulder internal rotation      Shoulder external rotation      Elbow flexion      Elbow extension      Wrist flexion      Wrist extension      Wrist ulnar deviation      Wrist radial deviation      Wrist pronation      Wrist supination      (Blank rows = not tested)  UPPER EXTREMITY MMT: not tested; Lt UE WFL's   MMT Right 11/05/21 Left 11/05/21  Shoulder flexion 4+/5 5/5  Shoulder extension 5/5 5/5  Shoulder abduction 5-/5 5/5  Shoulder adduction    Shoulder internal rotation 4+/5 5/5  Shoulder external rotation 4+/5 5/5  Middle trapezius 4/5 5/5  Lower trapezius 4/5 5/5                                      (Blank rows = not tested)   PALPATION:  Muscular tightness noted Rt > Lt pecs; biceps; upper trap; leveator; teres   TODAY'S TREATMENT:  11/14/21 Therex UBE - L6 x 4 min , alt fwd/back each min  Scap squeeze 10 sec x 10 w/noodle along spine Press up w/blocks 10 sec x 5 reps x 2  sets Counter plank 1 min x 3 reps  Overhead press 5# KB x 10 x 2 sets Rt/Lt Bent row 5# KB x 10 x 2 sets  Doorway stretch 3 positions 30 sec x 2 each position Scaption 3# wt bilat x 10 Shoulder flexion 3# wt bilat x 10  Overhead press with dowel 2.5 wts suspended w/TB x 10 x 2  Bilat shoulder flexion stepping under swiss ball 30 sec x 3  Bouncing swiss ball on wall 1 min x 3  Upper trap stretch 3 x 15 sec bilat IR AAROM stretch with strap 30 sec x 3 reps  Bodyblade - Rt slight fwd shd/elbow flex/ext x 1 min x 2 Bodyblade -  bilat chest level and up x 1 min x 1    Optional exercises: Shoulder adduction Rt blue TB 10 x 2 sets  Row blue TB 3 sec hold x 10 x 2 sets        ER blue TB 3 sec hold x 10 x 2 sets IR blue TB 3 sec hold x 10 x 2 sets Shld extension blue TB x 10 Antirotation blue TB x 10 each side  Finger ladder x 3 - to 30   Manual STM Rt anterior chest, UT, Rt anterior shoulder/arm PROM Rt shoulder   PATIENT EDUCATION: Education details: POC; HEP; ice for pain management; postural correction Person educated: Patient Education method: Explanation, Demonstration, Tactile cues, Verbal cues, and Handouts Education comprehension: verbalized understanding, returned demonstration, verbal cues required, tactile cues required, and needs further education   HOME EXERCISE PROGRAM:  Access Code: [email or web address] URL: [email or web address] Date: 11/14/2021 Prepared by: Corlis Leak  Exercises - Seated Scapular Retraction  - 2 x daily - 7 x weekly - 1-2 sets - 10 reps - 10 sec  hold - Circular Shoulder Pendulum with Table Support  - 3-4 x daily - 7 x weekly - 1 sets - 20-30 reps - Standing Shoulder and Trunk Flexion at Table  - 2 x daily - 7 x weekly - 1 sets - 5 reps - 5 sec  hold - Seated Shoulder Flexion Towel Slide at Table Top Full Range of Motion  - 2 x daily - 7 x weekly - 1 sets - 5-10 reps - 10sec  hold - Seated Shoulder External Rotation AAROM with Cane and Hand  in Neutral  - 2 x daily - 7 x weekly - 1 sets - 5-10 reps - 5-10 sec  hold - Standing Backward Shoulder Rolls  - 2 x daily - 7 x weekly - 1 sets - 10 reps - 1-2 sec  hold - Standing Shoulder Extension with Dowel  - 2 x daily - 7 x weekly - 1 sets - 5-10 reps - 5-10 sec  hold - Supine Diaphragmatic Breathing  - 2 x daily - 7 x weekly - 1 sets - 10 reps - 4-6 sec  hold - Standing Shoulder W at Wall  - 2 x daily - 7 x weekly - 1 sets - 10 reps - 10 sec  hold - Standing Shoulder Internal Rotation Stretch with Towel  - 2 x daily - 7 x weekly - 1 sets - 3-5 reps - 15-20 sec  hold - Shoulder Flexion Wall Slide with Towel  - 2 x daily - 7 x weekly - 1 sets - 5-10 reps - 10 sec  hold - Seated Shoulder Flexion AAROM with Pulley Behind  - 2 x daily - 7 x weekly - 1 sets - 10 reps - 10 sec  hold - Seated Shoulder Scaption AAROM with Pulley at Side  - 2 x daily - 7 x weekly - 1 sets - 10 reps - 10sec  hold - Standing Shoulder Row Reactive Isometric  - 2 x daily - 7 x weekly - 1 sets - 10 reps - 20-30 sec  hold - Shoulder External Rotation Reactive Isometrics  - 1 x daily - 7 x weekly - 1 sets - 5-10 reps - 10-30 sec  hold - Shoulder Internal Rotation Reactive Isometrics  - 2 x daily - 7 x weekly - 1 sets - 10 reps - 3-5 sec  hold - Standing Bilateral Low Shoulder Row with Anchored Resistance  -  2 x daily - 7 x weekly - 1-3 sets - 10 reps - 2-3 sec  hold - Shoulder Extension with Resistance  - 2 x daily - 7 x weekly - 1-3 sets - 10 reps - 2-3 sec  hold - Shoulder External Rotation with Anchored Resistance  - 2 x daily - 7 x weekly - 1-2 sets - 10 reps - 3 sec  hold - Shoulder Internal Rotation with Resistance  - 2 x daily - 7 x weekly - 2 sets - 10 reps - 3 sec  hold - Drawing Bow  - 1 x daily - 7 x weekly - 1 sets - 10 reps - 3 sec  hold - Upper Trapezius Stretch  - 2 x daily - 7 x weekly - 1 sets - 3 reps - 10-15 sec  hold - Anti-Rotation Lateral Stepping with Press  - 2 x daily - 7 x weekly - 1-2 sets - 10  reps - 2-3 sec  hold - Standing Lat Pull Down with Resistance - Elbows Bent  - 2 x daily - 7 x weekly - 1 sets - 10 reps - 3 sec  hold - Isometric Shoulder External Rotation in Abduction with Ball at Wall  - 1 x daily - 7 x weekly - 1-2 min hold - Supine Chest Stretch on Foam Roll  - 2 x daily - 7 x weekly - 1 sets - 1 reps - 2-5 min  sec  hold - Shoulder Adduction with Anchored Resistance  - 1 x daily - 7 x weekly - 1-3 sets - 10 reps - 2-3 sec  hold - Seated Shoulder Press Ups Off Table  - 1 x daily - 7 x weekly - 1-3 sets - 10 reps - 3 sec  hold - Doorway Pec Stretch at 60 Degrees Abduction  - 2-3 x daily - 7 x weekly - 1 sets - 3 reps - 30 sec hold - Doorway Pec Stretch at 90 Degrees Abduction  - 2-3 x daily - 7 x weekly - 1 sets - 3 reps - 30 seconds  hold - Doorway Pec Stretch at 120 Degrees Abduction  - 2-3 x daily - 7 x weekly - 1 sets - 3 reps - 30 second hold  hold - Scaption with Dumbbells  - 1 x daily - 7 x weekly - 1-3 sets - 10 reps - 2 sec  hold - Standing Shoulder Flexion to 90 Degrees with Dumbbells  - 1 x daily - 7 x weekly - 1-3 sets - 10 reps - 2 sec  hold - Shoulder Overhead Press in Flexion with Dumbbells  - 1 x daily - 7 x weekly - 1-3 sets - 10 reps - 2 sec  hold - Plank on Counter  - 2 x daily - 7 x weekly - 1 sets - 3 reps - 60 sec  hold  Patient Education - Scar Massage  ASSESSMENT:  CLINICAL IMPRESSION:  11/14/21 - Continued progress with Rt shoulder rehab. Progressing exercises, resistance, reps as tolerated. Note some persistent tightness through pecs. Encouraged patient to continue with exercise program consistently . Continues gradually progress towards goals  Evaluation Patient is a 36 y.o. female who was seen today for physical therapy evaluation and treatment for bilat shoulder pain of a recurrent nature now s/p Rt shoulder arthroscopy for debridement. Patient has a history of bilat shoulder pain over the past several years with symptoms treated with  medications and injections. She has a history of  fibromyalgia and chronic back and neck pain. Patient is sedentary, not working at this time. She presents with Rt UE in sling with post op dressing. She has poor posture and alignment; limited ROM/mobility Rt UE; generalized weakness; decreased functional activity level; pain. Patient will benefit from PT to address problems identified.       GOALS: Goals reviewed with patient? Yes   LONG TERM GOALS:   Decrease pain in bilat UE's by 50-75% allowing patient to return to all normal functional activities  Baseline:  Goal status: INITIAL  2.  Improve posture and alignment with patient to demonstrate improved upright posture with posterior shoulder girdle musculature engaged  Baseline:  Goal status: INITIAL  3.  4/5 to 5/5 strength Rt UE  Baseline:  Goal status: INITIAL  4.  AROM Rt shoulder to =/> than AROM Lt shoulder  Baseline:  Goal status: INITIAL  5.  Independent in HEP (including aquatic program as indicated) Baseline:  Goal status: INITIAL  6.  Improve functional limitation score to  Baseline:  Goal status: INITIAL   PLAN: PT FREQUENCY: 2x/week  PT DURATION: 6 weeks  PLANNED INTERVENTIONS: Therapeutic exercises, Therapeutic activity, Neuromuscular re-education, Patient/Family education, Self Care, Joint mobilization, Aquatic Therapy, Dry Needling, Electrical stimulation, Cryotherapy, Moist heat, Taping, Vasopneumatic device, Ultrasound, Manual therapy, and Re-evaluation  PLAN FOR NEXT SESSION: review HEP; progress with AAROM to AROM and strengthening all planes as patient tolerates; postural strengthening; manual work and modalities as indicated. Trial of plank     Lemonte Al Rober Minion, PT, MPH  11/14/2021, 2:05 PM   Addendum: HEP for LE strengthening to improve postural control  Access Code: [email or web address] URL: [email or web address] Date: 11/14/2021 Prepared by: Corlis Leak  Exercises - Wall Quarter Squat  - 1  x daily - 7 x weekly - 1-2 sets - 10 reps - 5-10 sec  hold - Wall Quarter Squat with Swiss Ball  - 1 x daily - 7 x weekly - 1-2 sets - 10 reps - 5-10 sec  hold - Sit to Stand  - 1 x daily - 7 x weekly - 1 sets - 10 reps - 3-5 sec  hold - Side Stepping with Resistance at Thighs  - 1 x daily - 7 x weekly

## 2021-11-20 ENCOUNTER — Encounter: Payer: Self-pay | Admitting: Rehabilitative and Restorative Service Providers"

## 2021-11-20 ENCOUNTER — Ambulatory Visit: Payer: BC Managed Care – PPO | Admitting: Rehabilitative and Restorative Service Providers"

## 2021-11-20 DIAGNOSIS — R29898 Other symptoms and signs involving the musculoskeletal system: Secondary | ICD-10-CM | POA: Diagnosis not present

## 2021-11-20 DIAGNOSIS — M25511 Pain in right shoulder: Secondary | ICD-10-CM | POA: Diagnosis not present

## 2021-11-20 DIAGNOSIS — R293 Abnormal posture: Secondary | ICD-10-CM

## 2021-11-20 DIAGNOSIS — G2589 Other specified extrapyramidal and movement disorders: Secondary | ICD-10-CM

## 2021-11-20 NOTE — Therapy (Signed)
OUTPATIENT PHYSICAL THERAPY TREATMENT   Patient Name: Chelsea [name] MRN: [id number] DOB:1985-06-24, 36 y.o., female Today's Date: 11/20/2021   PT End of Session - 11/20/21 1316     Visit Number 12    Number of Visits 14    Date for PT Re-Evaluation 11/22/21              Past Medical History:  Diagnosis Date   Depression    Endometrial polyp    Fibromyalgia    History of recurrent UTIs    Hypertension    Infertility, anovulation    Myofascial muscle pain    OA (osteoarthritis) of knee    BILATERAL   PCOS (polycystic ovarian syndrome)    PONV (postoperative nausea and vomiting)    Seasonal allergic rhinitis    Past Surgical History:  Procedure Laterality Date   CHOLECYSTECTOMY N/A 07/11/2021   Procedure: LAPAROSCOPIC CHOLECYSTECTOMY WITH INTRAOPERATIVE CHOLANGIOGRAM;  Surgeon: Manus Ruddsuei, Matthew, MD;  Location: [facility];  Service: General;  Laterality: N/A;   HYSTEROSCOPY WITH D & C N/A 09/07/2014   Procedure:  DILATATION AND CURETTAGE /HYSTEROSCOPY/POLYPECTOMY;  Surgeon: Fermin Schwabamer Yalcinkaya, MD;  Location: [facility];  Service: Gynecology;  Laterality: N/A;   HYSTEROSCOPY WITH D & C N/A 07/25/2021   Procedure: DILATATION AND CURETTAGE /HYSTEROSCOPY;  Surgeon: Jerene BearsMiller, Mary S, MD;  [facility] [place];  Service: Gynecology;  Laterality: N/A;   I & D RIGHT FOOT  1998   LAPAROSCOPIC GASTRIC SLEEVE RESECTION N/A 10/20/2017   Procedure: LAPAROSCOPIC GASTRIC SLEEVE RESECTION, UPPER ENDO, ERAS Pathway;  Surgeon: Berna Bueonnor, Chelsea A, MD;  Location: [facility];  Service: General;  Laterality: N/A;   POLYPECTOMY     uterine   TONSILLECTOMY  2001   WISDOM TOOTH EXTRACTION  2004   Patient Active Problem List   Diagnosis Date Noted   Simple endometrial hyperplasia    Acute calculous cholecystitis 07/11/2021   Acute cholecystitis due to biliary calculus 07/11/2021   Right ankle sprain 07/19/2020   Chronic pain syndrome 05/30/2020   Impingement syndrome of  both shoulders 08/17/2019   Primary insomnia 05/11/2019   Paronychia of second toe, left 01/06/2019   Mixed hypercholesterolemia and hypertriglyceridemia 11/13/2018   Orgasmic headache 11/11/2018   Diet-controlled diabetes mellitus (HCC) 04/25/2017   Low back pain 08/24/2015   Intractable migraine without aura and with status migrainosus 06/24/2014   Myofascial pain syndrome with depression 02/08/2014   Venous stasis dermatitis 01/25/2014   Right hip pain 12/13/2013   Metatarsalgia of both feet 10/18/2013   Right knee pain 02/13/2012   Essential hypertension, benign 11/25/2008   POLYCYSTIC OVARIAN DISEASE 10/26/2008   History of gastric sleeve 10/26/2008    PCP: Dr Rodney Langtonhomas Thekkekandam  REFERRING PROVIDER: Dr Ramond Marrowax Varkey   REFERRING DIAG: Arthroscopy Rt shoulder    THERAPY DIAG:  Acute pain of right shoulder  Other symptoms and signs involving the musculoskeletal system  Abnormal posture  Scapular dyskinesis  Rationale for Evaluation and Treatment Rehabilitation  ONSET DATE: 10/01/21  SUBJECTIVE:              11/20/21 - Pt states that her shoulder is feeling OK. She is having some generalized increase in stiffness and tightness which is from her fibromyalgia. She has been doing her exercises at home. Shoulder continues to feel "tight" and aching at times but fewer episodes of pain "spikes".  SUBJECTIVE STATEMENT: Evaluation: Patient reports that she has had ongoing bilat shoulder pain worsening in the past couple years. She had arthroscopic surgery 10/01/21 for debridement of Rt shoulder.   PERTINENT HISTORY: History of recurrent bilat shoulder pain for several years with injections Rt shoulder 3/22 and Lt 4/22. History of fibromyalgia; HTN  PAIN:  11/14/21: Are you having pain? 0/10  some discomfort or  pain with certain movements Tight with "spike of pain with certain movements" Worse w/certain movements 6-7/10 - not as often Better with rest  PRECAUTIONS: None  FALLS:  Has patient fallen in last 6 months? No  LIVING ENVIRONMENT: Lives with: parents  Lives in: House/apartment Has following equipment at home: shower chair, Shower bench, and Grab bars  OCCUPATION: Not working due to fibromyalgia pain   PLOF: Independent  PATIENT GOALS be able to use arm again and not hurt as much in the shoulder; wash her own hair   OBJECTIVE:   UPPER EXTREMITY ROM:  Rt/Lt UE WFLs' limited end ranges shoulder elevation   Active ROM Right eval Right  10/30/21 Right 11/05/21 Left eval  Shoulder extension  54 54 50  Shoulder flexion  152 150 155  Shoulder abduction  141 155 156  Shoulder adduction  90 deg at 90 deg shoulder abduction/90 deg elbow flexion 90 deg at 90 deg shoulder abduction/90 deg elbow flexion 90 at 90 deg shd abd/elbow 90 deg flex  Shoulder internal rotation      Shoulder external rotation      Elbow flexion      Elbow extension      Wrist flexion      Wrist extension      Wrist ulnar deviation      Wrist radial deviation      Wrist pronation      Wrist supination      (Blank rows = not tested)  UPPER EXTREMITY MMT: not tested; Lt UE WFL's   MMT Right 11/05/21 Left 11/05/21  Shoulder flexion 4+/5 5/5  Shoulder extension 5/5 5/5  Shoulder abduction 5-/5 5/5  Shoulder adduction    Shoulder internal rotation 4+/5 5/5  Shoulder external rotation 4+/5 5/5  Middle trapezius 4/5 5/5  Lower trapezius 4/5 5/5                                      (Blank rows = not tested)   PALPATION:  Muscular tightness noted Rt > Lt pecs; biceps; upper trap; leveator; teres   TODAY'S TREATMENT:  11/20/21 Therex UBE - L7 x 4 min , alt fwd/back each min  Press up w/blocks 10 sec x 5 reps x 2 sets Counter plank 1 min x 3 reps  Overhead press 5# KB x 10 x 2 sets  Rt/Lt Bent row 5# KB x 10 x 2 sets  Doorway stretch 3 positions 30 sec x 2 each position Scaption 4# wt bilat x 10 Shoulder flexion 4# wt bilat x 10  Overhead press w/dowel 2.5wtsx2 suspended w/TBx10x2  Bilat shoulder flexion stepping under swiss ball 30 sec x 3  Bouncing swiss ball on wall 1 min x 3  Upper trap stretch 3 x 15 sec bilat IR AAROM stretch with strap 30 sec x 3 reps  Bodyblade - Rt slight fwd shd/elbow flex/ext x 1 min x 2 Bodyblade - bilat chest level and overhead x 1 min x 1   Antirotation blue TB  x 10 each side   Optional exercises: Scap squeeze 10 sec x 10 w/noodle along spine Shoulder adduction Rt blue TB 10 x 2 sets  Row blue TB 3 sec hold x 10 x 2 sets        ER blue TB 3 sec hold x 10 x 2 sets IR blue TB 3 sec hold x 10 x 2 sets Shld extension blue TB x 10 Finger ladder x 3 - to 30   Manual STM Rt anterior chest, UT, Rt anterior shoulder/arm PROM Rt shoulder   PATIENT EDUCATION: Education details: POC; HEP; ice for pain management; postural correction Person educated: Patient Education method: Explanation, Demonstration, Tactile cues, Verbal cues, and Handouts Education comprehension: verbalized understanding, returned demonstration, verbal cues required, tactile cues required, and needs further education   HOME EXERCISE PROGRAM:  Access Code: [email or web address] URL: [email or web address] Date: 11/14/2021 Prepared by: Corlis Leak  Exercises - Seated Scapular Retraction  - 2 x daily - 7 x weekly - 1-2 sets - 10 reps - 10 sec  hold - Circular Shoulder Pendulum with Table Support  - 3-4 x daily - 7 x weekly - 1 sets - 20-30 reps - Standing Shoulder and Trunk Flexion at Table  - 2 x daily - 7 x weekly - 1 sets - 5 reps - 5 sec  hold - Seated Shoulder Flexion Towel Slide at Table Top Full Range of Motion  - 2 x daily - 7 x weekly - 1 sets - 5-10 reps - 10sec  hold - Seated Shoulder External Rotation AAROM with Cane and Hand in Neutral  - 2 x daily -  7 x weekly - 1 sets - 5-10 reps - 5-10 sec  hold - Standing Backward Shoulder Rolls  - 2 x daily - 7 x weekly - 1 sets - 10 reps - 1-2 sec  hold - Standing Shoulder Extension with Dowel  - 2 x daily - 7 x weekly - 1 sets - 5-10 reps - 5-10 sec  hold - Supine Diaphragmatic Breathing  - 2 x daily - 7 x weekly - 1 sets - 10 reps - 4-6 sec  hold - Standing Shoulder W at Wall  - 2 x daily - 7 x weekly - 1 sets - 10 reps - 10 sec  hold - Standing Shoulder Internal Rotation Stretch with Towel  - 2 x daily - 7 x weekly - 1 sets - 3-5 reps - 15-20 sec  hold - Shoulder Flexion Wall Slide with Towel  - 2 x daily - 7 x weekly - 1 sets - 5-10 reps - 10 sec  hold - Seated Shoulder Flexion AAROM with Pulley Behind  - 2 x daily - 7 x weekly - 1 sets - 10 reps - 10 sec  hold - Seated Shoulder Scaption AAROM with Pulley at Side  - 2 x daily - 7 x weekly - 1 sets - 10 reps - 10sec  hold - Standing Shoulder Row Reactive Isometric  - 2 x daily - 7 x weekly - 1 sets - 10 reps - 20-30 sec  hold - Shoulder External Rotation Reactive Isometrics  - 1 x daily - 7 x weekly - 1 sets - 5-10 reps - 10-30 sec  hold - Shoulder Internal Rotation Reactive Isometrics  - 2 x daily - 7 x weekly - 1 sets - 10 reps - 3-5 sec  hold - Standing Bilateral Low Shoulder Row with Anchored Resistance  - 2 x daily -  7 x weekly - 1-3 sets - 10 reps - 2-3 sec  hold - Shoulder Extension with Resistance  - 2 x daily - 7 x weekly - 1-3 sets - 10 reps - 2-3 sec  hold - Shoulder External Rotation with Anchored Resistance  - 2 x daily - 7 x weekly - 1-2 sets - 10 reps - 3 sec  hold - Shoulder Internal Rotation with Resistance  - 2 x daily - 7 x weekly - 2 sets - 10 reps - 3 sec  hold - Drawing Bow  - 1 x daily - 7 x weekly - 1 sets - 10 reps - 3 sec  hold - Upper Trapezius Stretch  - 2 x daily - 7 x weekly - 1 sets - 3 reps - 10-15 sec  hold - Anti-Rotation Lateral Stepping with Press  - 2 x daily - 7 x weekly - 1-2 sets - 10 reps - 2-3 sec  hold -  Standing Lat Pull Down with Resistance - Elbows Bent  - 2 x daily - 7 x weekly - 1 sets - 10 reps - 3 sec  hold - Isometric Shoulder External Rotation in Abduction with Ball at Wall  - 1 x daily - 7 x weekly - 1-2 min hold - Supine Chest Stretch on Foam Roll  - 2 x daily - 7 x weekly - 1 sets - 1 reps - 2-5 min  sec  hold - Shoulder Adduction with Anchored Resistance  - 1 x daily - 7 x weekly - 1-3 sets - 10 reps - 2-3 sec  hold - Seated Shoulder Press Ups Off Table  - 1 x daily - 7 x weekly - 1-3 sets - 10 reps - 3 sec  hold - Doorway Pec Stretch at 60 Degrees Abduction  - 2-3 x daily - 7 x weekly - 1 sets - 3 reps - 30 sec hold - Doorway Pec Stretch at 90 Degrees Abduction  - 2-3 x daily - 7 x weekly - 1 sets - 3 reps - 30 seconds  hold - Doorway Pec Stretch at 120 Degrees Abduction  - 2-3 x daily - 7 x weekly - 1 sets - 3 reps - 30 second hold  hold - Scaption with Dumbbells  - 1 x daily - 7 x weekly - 1-3 sets - 10 reps - 2 sec  hold - Standing Shoulder Flexion to 90 Degrees with Dumbbells  - 1 x daily - 7 x weekly - 1-3 sets - 10 reps - 2 sec  hold - Shoulder Overhead Press in Flexion with Dumbbells  - 1 x daily - 7 x weekly - 1-3 sets - 10 reps - 2 sec  hold - Plank on Counter  - 2 x daily - 7 x weekly - 1 sets - 3 reps - 60 sec  hold  Patient Education - Scar Massage  ASSESSMENT:  CLINICAL IMPRESSION:  11/20/21 - Continued progress with Rt shoulder rehab. Progressing exercises, resistance, reps as tolerated. Note some persistent tightness through pecs. Encouraged patient to continue with exercise program consistently . Continues gradually progress towards goals. Scheduled for one additional visit prior to d/c. Patient will continue with independent HEP at d/c.   Evaluation Patient is a 36 y.o. female who was seen today for physical therapy evaluation and treatment for bilat shoulder pain of a recurrent nature now s/p Rt shoulder arthroscopy for debridement. Patient has a history of bilat  shoulder pain over the past several  years with symptoms treated with medications and injections. She has a history of fibromyalgia and chronic back and neck pain. Patient is sedentary, not working at this time. She presents with Rt UE in sling with post op dressing. She has poor posture and alignment; limited ROM/mobility Rt UE; generalized weakness; decreased functional activity level; pain. Patient will benefit from PT to address problems identified.       GOALS: Goals reviewed with patient? Yes   LONG TERM GOALS:  11/22/21  Decrease pain in bilat UE's by 50-75% allowing patient to return to all normal functional activities  Baseline:  Goal status: accomplished   2.  Improve posture and alignment with patient to demonstrate improved upright posture with posterior shoulder girdle musculature engaged  Baseline:  Goal status: on going   3.  4/5 to 5/5 strength Rt UE  Baseline:  Goal status: on going   4.  AROM Rt shoulder to =/> than AROM Lt shoulder  Baseline:  Goal status: INITIAL  5.  Independent in HEP (including aquatic program as indicated) Baseline:  Goal status: on going   6.  Improve functional limitation score to 55 Baseline: 4 at eval; 11/20/21 -  35 Goal status: on going    PLAN: PT FREQUENCY: 2x/week  PT DURATION: 6 weeks  PLANNED INTERVENTIONS: Therapeutic exercises, Therapeutic activity, Neuromuscular re-education, Patient/Family education, Self Care, Joint mobilization, Aquatic Therapy, Dry Needling, Electrical stimulation, Cryotherapy, Moist heat, Taping, Vasopneumatic device, Ultrasound, Manual therapy, and Re-evaluation  PLAN FOR NEXT SESSION: review HEP; progress with AAROM to AROM and strengthening all planes as patient tolerates; postural strengthening; manual work and modalities as indicated.   Derwood Becraft Nilda Simmer, PT, MPH  11/20/2021, 1:17 PM   Addendum: HEP for LE strengthening to improve postural control  Access Code: [email or web address] URL:  [email or web address] Date: 11/14/2021 Prepared by: Gillermo Murdoch  Exercises - Wall Quarter Squat  - 1 x daily - 7 x weekly - 1-2 sets - 10 reps - 5-10 sec  hold - Wall Quarter Squat with Swiss Ball  - 1 x daily - 7 x weekly - 1-2 sets - 10 reps - 5-10 sec  hold - Sit to Stand  - 1 x daily - 7 x weekly - 1 sets - 10 reps - 3-5 sec  hold - Side Stepping with Resistance at Thighs  - 1 x daily - 7 x weekly

## 2021-11-22 ENCOUNTER — Ambulatory Visit: Payer: BC Managed Care – PPO | Admitting: Rehabilitative and Restorative Service Providers"

## 2021-11-22 ENCOUNTER — Encounter: Payer: Self-pay | Admitting: Rehabilitative and Restorative Service Providers"

## 2021-11-22 DIAGNOSIS — R293 Abnormal posture: Secondary | ICD-10-CM

## 2021-11-22 DIAGNOSIS — G2589 Other specified extrapyramidal and movement disorders: Secondary | ICD-10-CM

## 2021-11-22 DIAGNOSIS — M25511 Pain in right shoulder: Secondary | ICD-10-CM

## 2021-11-22 DIAGNOSIS — R29898 Other symptoms and signs involving the musculoskeletal system: Secondary | ICD-10-CM

## 2021-11-22 NOTE — Therapy (Signed)
OUTPATIENT PHYSICAL THERAPY TREATMENT and DISCHARGE SUMMARY PHYSICAL THERAPY DISCHARGE SUMMARY  Visits from Start of Care: 13  Current functional level related to goals / functional outcomes: See progress note for discharge status    Remaining deficits: Needs to continue with independent HEP    Education / Equipment: HEP    Patient agrees to discharge. Patient goals were partially met. Patient is being discharged due to meeting the stated rehab goals.  - will continue to work on HEP to achieve remainder of goals.   Trendon Zaring P. Helene Kelp PT, MPH 11/22/21 1:34 PM   Patient Name: Chelsea [name] MRN: [id number] DOB:01/06/1986, 36 y.o., female Today's Date: 11/22/2021   PT End of Session - 11/22/21 1317     Visit Number 13    Number of Visits 14    Date for PT Re-Evaluation 11/22/21    PT Start Time 1316    PT Stop Time 1400    PT Time Calculation (min) 44 min              Past Medical History:  Diagnosis Date   Depression    Endometrial polyp    Fibromyalgia    History of recurrent UTIs    Hypertension    Infertility, anovulation    Myofascial muscle pain    OA (osteoarthritis) of knee    BILATERAL   PCOS (polycystic ovarian syndrome)    PONV (postoperative nausea and vomiting)    Seasonal allergic rhinitis    Past Surgical History:  Procedure Laterality Date   CHOLECYSTECTOMY N/A 07/11/2021   Procedure: LAPAROSCOPIC CHOLECYSTECTOMY WITH INTRAOPERATIVE CHOLANGIOGRAM;  Surgeon: Donnie Mesa, MD;  Location: [facility];  Service: General;  Laterality: N/A;   HYSTEROSCOPY WITH D & C N/A 09/07/2014   Procedure:  DILATATION AND CURETTAGE /HYSTEROSCOPY/POLYPECTOMY;  Surgeon: Governor Specking, MD;  Location: [place];  Service: Gynecology;  Laterality: N/A;   HYSTEROSCOPY WITH D & C N/A 07/25/2021   Procedure: DILATATION AND CURETTAGE /HYSTEROSCOPY;  Surgeon: Megan Salon, MD;  Location: [place];  Service: Gynecology;  Laterality: N/A;    I & D RIGHT FOOT  1998   LAPAROSCOPIC GASTRIC SLEEVE RESECTION N/A 10/20/2017   Procedure: LAPAROSCOPIC GASTRIC SLEEVE RESECTION, UPPER ENDO, ERAS Pathway;  Surgeon: Clovis Riley, MD;  Location: [facility];  Service: General;  Laterality: N/A;   POLYPECTOMY     uterine   TONSILLECTOMY  2001   WISDOM TOOTH EXTRACTION  2004   Patient Active Problem List   Diagnosis Date Noted   Simple endometrial hyperplasia    Acute calculous cholecystitis 07/11/2021   Acute cholecystitis due to biliary calculus 07/11/2021   Right ankle sprain 07/19/2020   Chronic pain syndrome 05/30/2020   Impingement syndrome of both shoulders 08/17/2019   Primary insomnia 05/11/2019   Paronychia of second toe, left 01/06/2019   Mixed hypercholesterolemia and hypertriglyceridemia 11/13/2018   Orgasmic headache 11/11/2018   Diet-controlled diabetes mellitus ([place]) 04/25/2017   Low back pain 08/24/2015   Intractable migraine without aura and with status migrainosus 06/24/2014   Myofascial pain syndrome with depression 02/08/2014   Venous stasis dermatitis 01/25/2014   Right hip pain 12/13/2013   Metatarsalgia of both feet 10/18/2013   Right knee pain 02/13/2012   Essential hypertension, benign 11/25/2008   POLYCYSTIC OVARIAN DISEASE 10/26/2008   History of gastric sleeve 10/26/2008    PCP: Dr Aundria Mems  REFERRING PROVIDER: Dr Ophelia Charter   REFERRING DIAG: Arthroscopy Rt shoulder    THERAPY  DIAG:  Acute pain of right shoulder  Other symptoms and signs involving the musculoskeletal system  Abnormal posture  Scapular dyskinesis  Rationale for Evaluation and Treatment Rehabilitation  ONSET DATE: 10/01/21  SUBJECTIVE:              11/22/21 - Pt states that her shoulder is feeling OK. She is having some Lt knee pain today. She has been doing her exercises at home. Shoulder continues to feel "tight" and aching at times but fewer episodes of pain. Feels confident in continuing with independent HEP.                                                                                                                                                                              SUBJECTIVE STATEMENT: Evaluation: Patient reports that she has had ongoing bilat shoulder pain worsening in the past couple years. She had arthroscopic surgery 10/01/21 for debridement of Rt shoulder.   PERTINENT HISTORY: History of recurrent bilat shoulder pain for several years with injections Rt shoulder 3/22 and Lt 4/22. History of fibromyalgia; HTN  PAIN:  11/22/21: Are you having pain? 0/10  some discomfort or pain with certain movements Tight with "spike of pain with certain movements" Worse w/certain movements 1-2/45 - not as often Better with rest  PRECAUTIONS: None  FALLS:  Has patient fallen in last 6 months? No  LIVING ENVIRONMENT: Lives with: parents  Lives in: House/apartment Has following equipment at home: shower chair, Shower bench, and Grab bars  OCCUPATION: Not working due to fibromyalgia pain   PLOF: Independent  PATIENT GOALS be able to use arm again and not hurt as much in the shoulder; wash her own hair   OBJECTIVE:   UPPER EXTREMITY ROM:  Rt/Lt UE WFLs' limited end ranges shoulder elevation   Active ROM Right eval Right  10/30/21 Right 11/05/21 Right  11/22/21 Left eval  Shoulder extension  54 54 60 50  Shoulder flexion  152 150 152 155  Shoulder abduction  141 155 155 156  Shoulder adduction  90 deg at 90 deg shoulder abduction/90 deg elbow flexion 90 deg at 90 deg shoulder abduction/90 deg elbow flexion 90 deg at 90 deg shoulder abduction/90 deg elbow flexion 90 at 90 deg shd abd/elbow 90 deg flex  Shoulder internal rotation       Shoulder external rotation       Elbow flexion       Elbow extension       Wrist flexion       Wrist extension       Wrist ulnar deviation       Wrist radial deviation       Wrist pronation  Wrist supination       (Blank rows = not  tested)  UPPER EXTREMITY MMT: not tested; Lt UE WFL's   MMT Right 11/05/21 Right  11/22/21 Left 11/05/21  Shoulder flexion 4+/5 5/5 5/5  Shoulder extension 5/5 5/5 5/5  Shoulder abduction 5-/5 5-/5 5/5  Shoulder adduction     Shoulder internal rotation 4+/5 5-/5 5/5  Shoulder external rotation 4+/5 5-/5 5/5  Middle trapezius 4/5 5-/5 5/5  Lower trapezius 4/5 4+/5 5/5                                               (Blank rows = not tested)   PALPATION:  Muscular tightness noted Rt > Lt pecs; biceps; upper trap; leveator; teres   TODAY'S TREATMENT:  11/22/21 Therex UBE - L7 x 4 min , alt fwd/back each min  Press up w/blocks 10 sec x 10 reps x 2 sets Counter plank 1 min x 3 reps  Overhead press 5# KB x 10 x 2 sets Rt/Lt Bent row 5# KB x 10 x 2 sets  Doorway stretch 3 positions 30 sec x 2 each position Scaption 4# wt bilat x 10 Shoulder flexion 4# wt bilat x 10  Overhead press w/dowel 2.5wtsx2 suspended w/TBx10x2  Bilat shoulder flexion stepping under swiss ball 30 sec x 3  Bouncing swiss ball on wall 1 min x 3  IR AAROM stretch with strap 30 sec x 3 reps  Bodyblade - Rt slight fwd shd/elbow flex/ext x 1 min x 2 Bodyblade - bilat chest level and overhead x 1 min x 1   Antirotation blue TB x 10 each side   Optional exercises: Upper trap stretch 3 x 15 sec bilat Scap squeeze 10 sec x 10 w/noodle along spine Shoulder adduction Rt blue TB 10 x 2 sets  Row blue TB 3 sec hold x 10 x 2 sets        ER blue TB 3 sec hold x 10 x 2 sets IR blue TB 3 sec hold x 10 x 2 sets Shld extension blue TB x 10 Finger ladder x 3 - to 30   Manual STM Rt anterior chest, UT, Rt anterior shoulder/arm PROM Rt shoulder   PATIENT EDUCATION: Education details: POC; HEP; ice for pain management; postural correction Person educated: Patient Education method: Explanation, Demonstration, Tactile cues, Verbal cues, and Handouts Education comprehension: verbalized understanding, returned  demonstration, verbal cues required, tactile cues required, and needs further education   HOME EXERCISE PROGRAM:  Access Code: [email or web address] URL: [email or web address] Date: 11/14/2021 Prepared by: Gillermo Murdoch  Exercises - Seated Scapular Retraction  - 2 x daily - 7 x weekly - 1-2 sets - 10 reps - 10 sec  hold - Circular Shoulder Pendulum with Table Support  - 3-4 x daily - 7 x weekly - 1 sets - 20-30 reps - Standing Shoulder and Trunk Flexion at Table  - 2 x daily - 7 x weekly - 1 sets - 5 reps - 5 sec  hold - Seated Shoulder Flexion Towel Slide at Table Top Full Range of Motion  - 2 x daily - 7 x weekly - 1 sets - 5-10 reps - 10sec  hold - Seated Shoulder External Rotation AAROM with Cane and Hand in Neutral  - 2 x daily - 7 x weekly - 1 sets - 5-10 reps -  5-10 sec  hold - Standing Backward Shoulder Rolls  - 2 x daily - 7 x weekly - 1 sets - 10 reps - 1-2 sec  hold - Standing Shoulder Extension with Dowel  - 2 x daily - 7 x weekly - 1 sets - 5-10 reps - 5-10 sec  hold - Supine Diaphragmatic Breathing  - 2 x daily - 7 x weekly - 1 sets - 10 reps - 4-6 sec  hold - Standing Shoulder W at Wall  - 2 x daily - 7 x weekly - 1 sets - 10 reps - 10 sec  hold - Standing Shoulder Internal Rotation Stretch with Towel  - 2 x daily - 7 x weekly - 1 sets - 3-5 reps - 15-20 sec  hold - Shoulder Flexion Wall Slide with Towel  - 2 x daily - 7 x weekly - 1 sets - 5-10 reps - 10 sec  hold - Seated Shoulder Flexion AAROM with Pulley Behind  - 2 x daily - 7 x weekly - 1 sets - 10 reps - 10 sec  hold - Seated Shoulder Scaption AAROM with Pulley at Side  - 2 x daily - 7 x weekly - 1 sets - 10 reps - 10sec  hold - Standing Shoulder Row Reactive Isometric  - 2 x daily - 7 x weekly - 1 sets - 10 reps - 20-30 sec  hold - Shoulder External Rotation Reactive Isometrics  - 1 x daily - 7 x weekly - 1 sets - 5-10 reps - 10-30 sec  hold - Shoulder Internal Rotation Reactive Isometrics  - 2 x daily - 7 x weekly - 1  sets - 10 reps - 3-5 sec  hold - Standing Bilateral Low Shoulder Row with Anchored Resistance  - 2 x daily - 7 x weekly - 1-3 sets - 10 reps - 2-3 sec  hold - Shoulder Extension with Resistance  - 2 x daily - 7 x weekly - 1-3 sets - 10 reps - 2-3 sec  hold - Shoulder External Rotation with Anchored Resistance  - 2 x daily - 7 x weekly - 1-2 sets - 10 reps - 3 sec  hold - Shoulder Internal Rotation with Resistance  - 2 x daily - 7 x weekly - 2 sets - 10 reps - 3 sec  hold - Drawing Bow  - 1 x daily - 7 x weekly - 1 sets - 10 reps - 3 sec  hold - Upper Trapezius Stretch  - 2 x daily - 7 x weekly - 1 sets - 3 reps - 10-15 sec  hold - Anti-Rotation Lateral Stepping with Press  - 2 x daily - 7 x weekly - 1-2 sets - 10 reps - 2-3 sec  hold - Standing Lat Pull Down with Resistance - Elbows Bent  - 2 x daily - 7 x weekly - 1 sets - 10 reps - 3 sec  hold - Isometric Shoulder External Rotation in Abduction with Ball at Wall  - 1 x daily - 7 x weekly - 1-2 min hold - Supine Chest Stretch on Foam Roll  - 2 x daily - 7 x weekly - 1 sets - 1 reps - 2-5 min  sec  hold - Shoulder Adduction with Anchored Resistance  - 1 x daily - 7 x weekly - 1-3 sets - 10 reps - 2-3 sec  hold - Seated Shoulder Press Ups Off Table  - 1 x daily - 7 x weekly -  1-3 sets - 10 reps - 3 sec  hold - Doorway Pec Stretch at 60 Degrees Abduction  - 2-3 x daily - 7 x weekly - 1 sets - 3 reps - 30 sec hold - Doorway Pec Stretch at 90 Degrees Abduction  - 2-3 x daily - 7 x weekly - 1 sets - 3 reps - 30 seconds  hold - Doorway Pec Stretch at 120 Degrees Abduction  - 2-3 x daily - 7 x weekly - 1 sets - 3 reps - 30 second hold  hold - Scaption with Dumbbells  - 1 x daily - 7 x weekly - 1-3 sets - 10 reps - 2 sec  hold - Standing Shoulder Flexion to 90 Degrees with Dumbbells  - 1 x daily - 7 x weekly - 1-3 sets - 10 reps - 2 sec  hold - Shoulder Overhead Press in Flexion with Dumbbells  - 1 x daily - 7 x weekly - 1-3 sets - 10 reps - 2 sec  hold -  Plank on Counter  - 2 x daily - 7 x weekly - 1 sets - 3 reps - 60 sec  hold  Patient Education - Scar Massage  ASSESSMENT:  CLINICAL IMPRESSION:  11/22/21 - Continued progress with Rt shoulder rehab. Progressing exercises, resistance, reps as tolerated. Note some persistent tightness through pecs. Encouraged patient to continue with exercise program consistently. Goals of therapy have been accomplished. Patient will continue with independent HEP.   Evaluation Patient is a 36 y.o. female who was seen today for physical therapy evaluation and treatment for bilat shoulder pain of a recurrent nature now s/p Rt shoulder arthroscopy for debridement. Patient has a history of bilat shoulder pain over the past several years with symptoms treated with medications and injections. She has a history of fibromyalgia and chronic back and neck pain. Patient is sedentary, not working at this time. She presents with Rt UE in sling with post op dressing. She has poor posture and alignment; limited ROM/mobility Rt UE; generalized weakness; decreased functional activity level; pain. Patient will benefit from PT to address problems identified.       GOALS: Goals reviewed with patient? Yes   LONG TERM GOALS:  11/22/21  Decrease pain in bilat UE's by 50-75% allowing patient to return to all normal functional activities  Baseline:  Goal status: accomplished   2.  Improve posture and alignment with patient to demonstrate improved upright posture with posterior shoulder girdle musculature engaged  Baseline:  Goal status: on going   3.  4/5 to 5/5 strength Rt UE  Baseline:  Goal status: accomplished  4.  AROM Rt shoulder to =/> than AROM Lt shoulder  Baseline:  Goal status: accomplished  5.  Independent in HEP (including aquatic program as indicated) Baseline:  Goal status: accomplished   6.  Improve functional limitation score to 55 Baseline: 4 at eval; 11/20/21 -  70 Goal status: accomplished     PLAN: PT FREQUENCY: 2x/week  PT DURATION: 6 weeks  PLANNED INTERVENTIONS: Therapeutic exercises, Therapeutic activity, Neuromuscular re-education, Patient/Family education, Self Care, Joint mobilization, Aquatic Therapy, Dry Needling, Electrical stimulation, Cryotherapy, Moist heat, Taping, Vasopneumatic device, Ultrasound, Manual therapy, and Re-evaluation  PLAN FOR NEXT SESSION: d/c to independent HEP - patient will call with any questions or concerns   Betzabeth Derringer Nilda Simmer, PT, MPH  11/22/2021, 1:20 PM

## 2021-11-23 ENCOUNTER — Ambulatory Visit (INDEPENDENT_AMBULATORY_CARE_PROVIDER_SITE_OTHER): Payer: BC Managed Care – PPO

## 2021-11-23 ENCOUNTER — Ambulatory Visit (INDEPENDENT_AMBULATORY_CARE_PROVIDER_SITE_OTHER): Payer: BC Managed Care – PPO | Admitting: Sports Medicine

## 2021-11-23 DIAGNOSIS — M170 Bilateral primary osteoarthritis of knee: Secondary | ICD-10-CM

## 2021-11-23 DIAGNOSIS — G8929 Other chronic pain: Secondary | ICD-10-CM

## 2021-11-23 DIAGNOSIS — M25561 Pain in right knee: Secondary | ICD-10-CM

## 2021-11-23 NOTE — Assessment & Plan Note (Signed)
Repeat bilateral knee injections, last done several years ago.

## 2021-11-23 NOTE — Progress Notes (Signed)
    Procedures performed today:    Procedure: Real-time Ultrasound Guided injection of the left knee Device: Samsung HS60  Verbal informed consent obtained.  Time-out conducted.  Noted no overlying erythema, induration, or other signs of local infection.  Skin prepped in a sterile fashion.  Local anesthesia: Topical Ethyl chloride.  With sterile technique and under real time ultrasound guidance: Mild effusion noted, 1 cc Kenalog 40, 2 cc lidocaine, 2 cc bupivacaine injected easily Completed without difficulty  Advised to call if fevers/chills, erythema, induration, drainage, or persistent bleeding.  Images permanently stored and available for review in PACS.  Impression: Technically successful ultrasound guided injection.  Procedure: Real-time Ultrasound Guided injection of the right knee Device: Samsung HS60  Verbal informed consent obtained.  Time-out conducted.  Noted no overlying erythema, induration, or other signs of local infection.  Skin prepped in a sterile fashion.  Local anesthesia: Topical Ethyl chloride.  With sterile technique and under real time ultrasound guidance: Mild effusion noted, 1 cc Kenalog 40, 2 cc lidocaine, 2 cc bupivacaine injected easily Completed without difficulty  Advised to call if fevers/chills, erythema, induration, drainage, or persistent bleeding.  Images permanently stored and available for review in PACS.  Impression: Technically successful ultrasound guided injection.  Independent interpretation of notes and tests performed by another provider:   None.  Brief History, Exam, Impression, and Recommendations:    Primary osteoarthritis of both knees Repeat bilateral knee injections, last done several years ago.    ____________________________________________ Gwen Her. Dianah Field, M.D., ABFM., CAQSM., AME. Primary Care and Sports Medicine [facility]  Adjunct Professor of [employer]  Risk manager

## 2021-12-18 DIAGNOSIS — M19011 Primary osteoarthritis, right shoulder: Secondary | ICD-10-CM | POA: Diagnosis not present

## 2021-12-31 ENCOUNTER — Ambulatory Visit
Admission: EM | Admit: 2021-12-31 | Discharge: 2021-12-31 | Disposition: A | Discharge status: Home / Self Care | Payer: BC Managed Care – PPO | Attending: Family Medicine | Admitting: Family Medicine

## 2021-12-31 DIAGNOSIS — Z87440 Personal history of urinary (tract) infections: Secondary | ICD-10-CM | POA: Diagnosis not present

## 2021-12-31 DIAGNOSIS — N390 Urinary tract infection, site not specified: Secondary | ICD-10-CM | POA: Diagnosis not present

## 2021-12-31 DIAGNOSIS — B999 Unspecified infectious disease: Secondary | ICD-10-CM | POA: Insufficient documentation

## 2021-12-31 LAB — POCT URINALYSIS DIP (MANUAL ENTRY)
Bilirubin, UA: NEGATIVE
Glucose, UA: NEGATIVE mg/dL
Ketones, POC UA: NEGATIVE mg/dL
Nitrite, UA: NEGATIVE
Protein Ur, POC: 30 mg/dL — AB
Spec Grav, UA: >=1.03 — AB (ref 1.010–1.025)
Urobilinogen, UA: 0.2 E.U./dL
pH, UA: 5.5 (ref 5.0–8.0)

## 2021-12-31 MED ORDER — FLUCONAZOLE 150 MG PO TABS: 150.0000 mg | ORAL_TABLET | Freq: Every day | ORAL | 0 refills | Status: DC

## 2021-12-31 MED ORDER — CIPROFLOXACIN HCL 500 MG PO TABS: 500.0000 mg | ORAL_TABLET | Freq: Two times a day (BID) | ORAL | 0 refills | Status: DC

## 2021-12-31 NOTE — ED Triage Notes (Signed)
Pt presents with c/o dysuria and urgency that began this morning.

## 2021-12-31 NOTE — ED Provider Notes (Signed)
[name]    CSN: [id number] Arrival date & time: 12/31/21  1140      History   Chief Complaint Chief Complaint  Patient presents with   Dysuria    HPI Chelsea [name] is a 36 y.o. female.   HPI Patient has a history of recurring UTI.  She is here because she has a 1 day history of dysuria and urgency.  No flank pain.  No abdominal pain.  No nausea or vomiting.  No fever or chills. Past Medical History:  Diagnosis Date   Depression    Endometrial polyp    Fibromyalgia    History of recurrent UTIs    Hypertension    Infertility, anovulation    Myofascial muscle pain    OA (osteoarthritis) of knee    BILATERAL   PCOS (polycystic ovarian syndrome)    PONV (postoperative nausea and vomiting)    Seasonal allergic rhinitis     Patient Active Problem List   Diagnosis Date Noted   Simple endometrial hyperplasia    Acute calculous cholecystitis 07/11/2021   Acute cholecystitis due to biliary calculus 07/11/2021   Right ankle sprain 07/19/2020   Chronic pain syndrome 05/30/2020   Impingement syndrome of both shoulders 08/17/2019   Primary insomnia 05/11/2019   Paronychia of second toe, left 01/06/2019   Mixed hypercholesterolemia and hypertriglyceridemia 11/13/2018   Orgasmic headache 11/11/2018   Diet-controlled diabetes mellitus ([place]) 04/25/2017   Low back pain 08/24/2015   Intractable migraine without aura and with status migrainosus 06/24/2014   Myofascial pain syndrome with depression 02/08/2014   Venous stasis dermatitis 01/25/2014   Right hip pain 12/13/2013   Metatarsalgia of both feet 10/18/2013   Primary osteoarthritis of both knees 02/13/2012   Essential hypertension, benign 11/25/2008   POLYCYSTIC OVARIAN DISEASE 10/26/2008   History of gastric sleeve 10/26/2008    Past Surgical History:  Procedure Laterality Date   CHOLECYSTECTOMY N/A 07/11/2021   Procedure: LAPAROSCOPIC CHOLECYSTECTOMY WITH INTRAOPERATIVE CHOLANGIOGRAM;  Surgeon:  Donnie Mesa, MD;  Location: [facility];  Service: General;  Laterality: N/A;   HYSTEROSCOPY WITH D & C N/A 09/07/2014   Procedure:  DILATATION AND CURETTAGE /HYSTEROSCOPY/POLYPECTOMY;  Surgeon: Governor Specking, MD;  Location: [place];  Service: Gynecology;  Laterality: N/A;   HYSTEROSCOPY WITH D & C N/A 07/25/2021   Procedure: DILATATION AND CURETTAGE /HYSTEROSCOPY;  Surgeon: Megan Salon, MD;  Location: [place];  Service: Gynecology;  Laterality: N/A;   I & D RIGHT FOOT  1998   LAPAROSCOPIC GASTRIC SLEEVE RESECTION N/A 10/20/2017   Procedure: LAPAROSCOPIC GASTRIC SLEEVE RESECTION, UPPER ENDO, ERAS Pathway;  Surgeon: Clovis Riley, MD;  Location: [facility];  Service: General;  Laterality: N/A;   POLYPECTOMY     uterine   TONSILLECTOMY  2001   WISDOM TOOTH EXTRACTION  2004    OB History     Gravida  0   Para  0   Term  0   Preterm  0   AB  0   Living  0      SAB  0   IAB  0   Ectopic  0   Multiple  0   Live Births               Home Medications    Prior to Admission medications   Medication Sig Start Date End Date Taking? Authorizing Provider  ciprofloxacin (CIPRO) 500 MG tablet Take 1 tablet (500 mg total) by mouth  2 (two) times daily. 12/31/21  Yes Eustace Moore, MD  fluconazole (DIFLUCAN) 150 MG tablet Take 1 tablet (150 mg total) by mouth daily. Repeat in 1 week if needed 12/31/21  Yes Eustace Moore, MD  ALPRAZolam Prudy Feeler) 0.5 MG tablet Take 1 tablet (0.5 mg total) by mouth 3 (three) times daily as needed for anxiety. 12/09/18   Monica Becton, MD  cyclobenzaprine (FLEXERIL) 5 MG tablet TAKE 1 OR 2 EVERY 8 HOURS AS NEEDED FOR MUSCLE RELAXANT. CAUTION: MAY CAUSE DROWSINESS. Patient taking differently: Take 5-10 mg by mouth 3 (three) times daily as needed for muscle spasms. Caution: May cause drowsiness. 09/09/19   Monica Becton, MD  FLUoxetine (PROZAC) 20 MG capsule TAKE 1 CAPSULE BY MOUTH EVERY  DAY Patient taking differently: Take 20 mg by mouth daily. 10/27/20   Monica Becton, MD  folic acid (FOLVITE) 800 MCG tablet Take 400 mcg by mouth daily.    [provider]  labetalol (NORMODYNE) 100 MG tablet Take 0.5 tablets (50 mg total) by mouth 2 (two) times daily. 09/28/21   Monica Becton, MD  loratadine (CLARITIN) 10 MG tablet Take 10 mg by mouth daily.    [provider]  Multiple Vitamins-Minerals (OPURITY PO) Take 2 capsules by mouth once. Bariatric vitamin. One capsule daily    [provider]  norethindrone (AYGESTIN) 5 MG tablet Take 1 tablet (5 mg total) by mouth daily. 08/20/21   Jerene Bears, MD  NOVOFINE PLUS PEN NEEDLE 32G X 4 MM MISC USE WEEKLY FOR OZEMPIC INJECTIONS 06/28/21   Monica Becton, MD  RYBELSUS 7 MG TABS TAKE 7 MG BY MOUTH DAILY. 10/24/21   Monica Becton, MD  Semaglutide, 2 MG/DOSE, 8 MG/3ML SOPN Inject 2 mg as directed once a week. Wednesday 08/15/21   Monica Becton, MD  topiramate (TOPAMAX) 100 MG tablet Take 100 mg by mouth 2 (two) times daily. 02/08/21   [provider]    Family History Family History  Problem Relation Age of Onset   Diabetes Mother    Polycystic ovary syndrome Mother    Hypertension Father    Diabetes Father    Fibromyalgia Maternal Aunt    Fibromyalgia Cousin    Cancer Paternal Grandfather        prostate   Glaucoma Paternal Grandmother     Social History Social History   Tobacco Use   Smoking status: Never   Smokeless tobacco: Never  Vaping Use   Vaping Use: Never used  Substance Use Topics   Alcohol use: No    Alcohol/week: 0.0 standard drinks of alcohol   Drug use: No     Allergies   Enalapril maleate, Erythromycin, Ceclor [cefaclor], Savella [milnacipran], Amoxicillin, Penicillins, and Sulfa antibiotics   Review of Systems Review of Systems  See HPI Physical Exam Triage Vital Signs ED Triage Vitals  Enc Vitals Group     BP 12/31/21  1155 137/88     Pulse Rate 12/31/21 1155 82     Resp 12/31/21 1155 14     Temp 12/31/21 1155 99.6 F (37.6 C)     Temp Source 12/31/21 1155 Oral     SpO2 12/31/21 1155 95 %     Weight --      Height --      Head Circumference --      Peak Flow --      Pain Score 12/31/21 1153 0     Pain Loc --  Pain Edu? --      Excl. in GC? --    No data found.  Updated Vital Signs BP 137/88 (BP Location: Right Arm)   Pulse 82   Temp 99.6 F (37.6 C) (Oral)   Resp 14   SpO2 95%     Physical Exam Constitutional:      General: She is not in acute distress.    Appearance: She is well-developed. She is obese.  HENT:     Head: Normocephalic and atraumatic.  Eyes:     Conjunctiva/sclera: Conjunctivae normal.     Pupils: Pupils are equal, round, and reactive to light.  Cardiovascular:     Rate and Rhythm: Normal rate.  Pulmonary:     Effort: Pulmonary effort is normal. No respiratory distress.  Abdominal:     General: There is no distension.     Palpations: Abdomen is soft.     Tenderness: There is no right CVA tenderness or left CVA tenderness.  Musculoskeletal:        General: Normal range of motion.     Cervical back: Normal range of motion.  Skin:    General: Skin is warm and dry.  Neurological:     Mental Status: She is alert.      UC Treatments / Results  Labs (all labs ordered are listed, but only abnormal results are displayed) Labs Reviewed  POCT URINALYSIS DIP (MANUAL ENTRY) - Abnormal; Notable for the following components:      Result Value   Clarity, UA cloudy (*)    Spec Grav, UA >=1.030 (*)    Blood, UA moderate (*)    Protein Ur, POC =30 (*)    Leukocytes, UA Small (1+) (*)    All other components within normal limits  URINE CULTURE    EKG   Radiology No results found.  Procedures Procedures (including critical care time)  Medications Ordered in UC Medications - No data to display  Initial Impression / Assessment and Plan / UC Course  I  have reviewed the triage vital signs and the nursing notes.  Pertinent labs & imaging results that were available during my care of the patient were reviewed by me and considered in my medical decision making (see chart for details).     Patient states the last couple times she had a UTI she was given Macrodantin and it did not work.  Had to be switched to another antibiotic.  I reviewed the medical record and saw that Dr. Benjamin Stain had given her Cipro.  We will do this pending her culture report. Final Clinical Impressions(s) / UC Diagnoses   Final diagnoses:  Lower urinary tract infectious disease     Discharge Instructions      Your urine culture report will be available in 2 to 3 days Take the Cipro 2 times a day Increase your water intake Take Diflucan if needed Follow-up with Dr. Benjamin Stain   ED Prescriptions     Medication Sig Dispense Auth. Provider   ciprofloxacin (CIPRO) 500 MG tablet Take 1 tablet (500 mg total) by mouth 2 (two) times daily. 14 tablet Eustace Moore, MD   fluconazole (DIFLUCAN) 150 MG tablet Take 1 tablet (150 mg total) by mouth daily. Repeat in 1 week if needed 2 tablet Eustace Moore, MD      PDMP not reviewed this encounter.   Eustace Moore, MD 12/31/21 [phone]

## 2021-12-31 NOTE — Discharge Instructions (Signed)
Your urine culture report will be available in 2 to 3 days Take the Cipro 2 times a day Increase your water intake Take Diflucan if needed Follow-up with Dr. Dianah Field

## 2022-01-02 LAB — URINE CULTURE: Culture: >=100000 — AB

## 2022-01-09 ENCOUNTER — Other Ambulatory Visit: Payer: Self-pay | Admitting: Sports Medicine

## 2022-01-09 DIAGNOSIS — I10 Essential (primary) hypertension: Secondary | ICD-10-CM

## 2022-01-10 ENCOUNTER — Other Ambulatory Visit: Payer: Self-pay

## 2022-01-10 NOTE — Telephone Encounter (Signed)
Med refill

## 2022-01-14 ENCOUNTER — Other Ambulatory Visit: Payer: Self-pay

## 2022-01-14 DIAGNOSIS — M7918 Myalgia, other site: Secondary | ICD-10-CM

## 2022-01-14 MED ORDER — FLUOXETINE HCL 20 MG PO CAPS: 20.0000 mg | ORAL_CAPSULE | Freq: Every day | ORAL | 1 refills | Status: DC

## 2022-01-14 NOTE — Telephone Encounter (Signed)
Med refill

## 2022-02-03 ENCOUNTER — Encounter: Payer: Self-pay | Admitting: Emergency Medicine

## 2022-02-03 ENCOUNTER — Ambulatory Visit
Admission: EM | Admit: 2022-02-03 | Discharge: 2022-02-03 | Disposition: A | Discharge status: Home / Self Care | Payer: BC Managed Care – PPO | Attending: Family Medicine | Admitting: Family Medicine

## 2022-02-03 ENCOUNTER — Telehealth: Payer: Self-pay | Admitting: Emergency Medicine

## 2022-02-03 DIAGNOSIS — H6691 Otitis media, unspecified, right ear: Secondary | ICD-10-CM | POA: Diagnosis not present

## 2022-02-03 DIAGNOSIS — J069 Acute upper respiratory infection, unspecified: Secondary | ICD-10-CM

## 2022-02-03 DIAGNOSIS — M778 Other enthesopathies, not elsewhere classified: Secondary | ICD-10-CM

## 2022-02-03 MED ORDER — FLUCONAZOLE 150 MG PO TABS: 150.0000 mg | ORAL_TABLET | Freq: Every day | ORAL | 0 refills | Status: DC

## 2022-02-03 MED ORDER — AMOXICILLIN-POT CLAVULANATE 875-125 MG PO TABS: 1.0000 | ORAL_TABLET | Freq: Two times a day (BID) | ORAL | 0 refills | Status: DC

## 2022-02-03 MED ORDER — PREDNISONE 20 MG PO TABS: 40.0000 mg | ORAL_TABLET | Freq: Every day | ORAL | 0 refills | Status: DC

## 2022-02-03 NOTE — ED Provider Notes (Signed)
[facility]    CSN: [id number] Arrival date & time: 02/03/22  1321      History   Chief Complaint Chief Complaint  Patient presents with   Sore Throat    HPI Chelsea [name] is a 36 y.o. female.   HPI  She has been sick for 2 weeks.  She has had cough cold runny nose and sore throat.  In the last few days she is developed increased ear pain right greater than left.  She states she also has tremendous sinus pressure and pain.  Green sputum.  Coughing and chest pain.  She no longer has a fever.  She did not do home COVID testing.  Her wife is here with her  Son mentions pain in left hand.  It is on the palmar aspect of her index finger.  She cannot fully flex or extend the finger.  The area looks slightly swollen..  Denies trauma  Past Medical History:  Diagnosis Date   Depression    Endometrial polyp    Fibromyalgia    History of recurrent UTIs    Hypertension    Infertility, anovulation    Myofascial muscle pain    OA (osteoarthritis) of knee    BILATERAL   PCOS (polycystic ovarian syndrome)    PONV (postoperative nausea and vomiting)    Seasonal allergic rhinitis     Patient Active Problem List   Diagnosis Date Noted   Simple endometrial hyperplasia    Acute calculous cholecystitis 07/11/2021   Acute cholecystitis due to biliary calculus 07/11/2021   Right ankle sprain 07/19/2020   Chronic pain syndrome 05/30/2020   Impingement syndrome of both shoulders 08/17/2019   Primary insomnia 05/11/2019   Paronychia of second toe, left 01/06/2019   Mixed hypercholesterolemia and hypertriglyceridemia 11/13/2018   Orgasmic headache 11/11/2018   Diet-controlled diabetes mellitus ([facility]) 04/25/2017   Low back pain 08/24/2015   Intractable migraine without aura and with status migrainosus 06/24/2014   Myofascial pain syndrome with depression 02/08/2014   Venous stasis dermatitis 01/25/2014   Right hip pain 12/13/2013   Metatarsalgia of both feet 10/18/2013    Primary osteoarthritis of both knees 02/13/2012   Essential hypertension, benign 11/25/2008   POLYCYSTIC OVARIAN DISEASE 10/26/2008   History of gastric sleeve 10/26/2008    Past Surgical History:  Procedure Laterality Date   CHOLECYSTECTOMY N/A 07/11/2021   Procedure: LAPAROSCOPIC CHOLECYSTECTOMY WITH INTRAOPERATIVE CHOLANGIOGRAM;  Surgeon: Manus Rudd, MD;  Location: [facility];  Service: General;  Laterality: N/A;   HYSTEROSCOPY WITH D & C N/A 09/07/2014   Procedure:  DILATATION AND CURETTAGE /HYSTEROSCOPY/POLYPECTOMY;  Surgeon: Fermin Schwab, MD;  Location: [facility];  Service: Gynecology;  Laterality: N/A;   HYSTEROSCOPY WITH D & C N/A 07/25/2021   Procedure: DILATATION AND CURETTAGE /HYSTEROSCOPY;  Surgeon: Jerene Bears, MD;  Location: [facility];  Service: Gynecology;  Laterality: N/A;   I & D RIGHT FOOT  1998   LAPAROSCOPIC GASTRIC SLEEVE RESECTION N/A 10/20/2017   Procedure: LAPAROSCOPIC GASTRIC SLEEVE RESECTION, UPPER ENDO, ERAS Pathway;  Surgeon: Berna Bue, MD;  Location: [facility];  Service: General;  Laterality: N/A;   POLYPECTOMY     uterine   TONSILLECTOMY  2001   WISDOM TOOTH EXTRACTION  2004    OB History     Gravida  0   Para  0   Term  0   Preterm  0   AB  0   Living  0  SAB  0   IAB  0   Ectopic  0   Multiple  0   Live Births               Home Medications    Prior to Admission medications   Medication Sig Start Date End Date Taking? Authorizing Provider  ALPRAZolam Prudy Feeler) 0.5 MG tablet Take 1 tablet (0.5 mg total) by mouth 3 (three) times daily as needed for anxiety. 12/09/18  Yes Monica Becton, MD  amoxicillin-clavulanate (AUGMENTIN) 875-125 MG tablet Take 1 tablet by mouth every 12 (twelve) hours. 02/03/22  Yes Eustace Moore, MD  cyclobenzaprine (FLEXERIL) 5 MG tablet TAKE 1 OR 2 EVERY 8 HOURS AS NEEDED FOR MUSCLE RELAXANT. CAUTION: MAY CAUSE DROWSINESS. Patient taking  differently: Take 5-10 mg by mouth 3 (three) times daily as needed for muscle spasms. Caution: May cause drowsiness. 09/09/19  Yes Monica Becton, MD  FLUoxetine (PROZAC) 20 MG capsule Take 1 capsule (20 mg total) by mouth daily. 01/14/22  Yes Monica Becton, MD  folic acid (FOLVITE) 800 MCG tablet Take 400 mcg by mouth daily.   Yes [provider]  labetalol (NORMODYNE) 100 MG tablet TAKE 1 TABLET BY MOUTH TWICE A DAY 01/09/22  Yes Monica Becton, MD  loratadine (CLARITIN) 10 MG tablet Take 10 mg by mouth daily.   Yes [provider]  Multiple Vitamins-Minerals (OPURITY PO) Take 2 capsules by mouth once. Bariatric vitamin. One capsule daily   Yes [provider]  norethindrone (AYGESTIN) 5 MG tablet Take 1 tablet (5 mg total) by mouth daily. 08/20/21  Yes Jerene Bears, MD  predniSONE (DELTASONE) 20 MG tablet Take 2 tablets (40 mg total) by mouth daily with breakfast. 02/03/22  Yes Eustace Moore, MD  topiramate (TOPAMAX) 100 MG tablet Take 100 mg by mouth 2 (two) times daily. 02/08/21  Yes [provider]  fluconazole (DIFLUCAN) 150 MG tablet Take 1 tablet (150 mg total) by mouth daily. Repeat in 1 week if needed 02/03/22   Eustace Moore, MD  NOVOFINE PLUS PEN NEEDLE 32G X 4 MM MISC USE WEEKLY FOR OZEMPIC INJECTIONS 06/28/21   Monica Becton, MD  RYBELSUS 7 MG TABS TAKE 7 MG BY MOUTH DAILY. 10/24/21   Monica Becton, MD  Semaglutide, 2 MG/DOSE, 8 MG/3ML SOPN Inject 2 mg as directed once a week. Wednesday 08/15/21   Monica Becton, MD    Family History Family History  Problem Relation Age of Onset   Diabetes Mother    Polycystic ovary syndrome Mother    Hypertension Father    Diabetes Father    Fibromyalgia Maternal Aunt    Fibromyalgia Cousin    Cancer Paternal Grandfather        prostate   Glaucoma Paternal Grandmother     Social History Social History   Tobacco Use   Smoking status: Never    Smokeless tobacco: Never  Vaping Use   Vaping Use: Never used  Substance Use Topics   Alcohol use: No    Alcohol/week: 0.0 standard drinks of alcohol   Drug use: No     Allergies   Enalapril maleate, Erythromycin, Ceclor [cefaclor], Savella [milnacipran], Amoxicillin, Penicillins, and Sulfa antibiotics   Review of Systems Review of Systems See HPI  Physical Exam Triage Vital Signs ED Triage Vitals  Enc Vitals Group     BP 02/03/22 1442 (!) 143/99     Pulse Rate 02/03/22 1442 (!) 105  Resp 02/03/22 1442 18     Temp 02/03/22 1442 98.9 F (37.2 C)     Temp Source 02/03/22 1442 Oral     SpO2 02/03/22 1442 95 %     Weight 02/03/22 1444 260 lb (117.9 kg)     Height 02/03/22 1444 5\' 9"  (1.753 m)     Head Circumference --      Peak Flow --      Pain Score 02/03/22 1443 0     Pain Loc --      Pain Edu? --      Excl. in GC? --    No data found.  Updated Vital Signs BP (!) 143/99 (BP Location: Left Arm)   Pulse (!) 105   Temp 98.9 F (37.2 C) (Oral)   Resp 18   Ht 5\' 9"  (1.753 m)   Wt 117.9 kg   SpO2 95%   BMI 38.40 kg/m       Physical Exam Constitutional:      General: She is not in acute distress.    Appearance: She is well-developed. She is obese. She is ill-appearing.  HENT:     Head: Normocephalic and atraumatic.     Right Ear: Ear canal normal. Tympanic membrane is erythematous.     Left Ear: Ear canal normal. Tympanic membrane is erythematous.     Nose: Congestion present. No rhinorrhea.     Mouth/Throat:     Mouth: Mucous membranes are moist.     Pharynx: Uvula midline. Posterior oropharyngeal erythema present. No pharyngeal swelling.     Tonsils: No tonsillar exudate. 0 on the left.  Eyes:     Conjunctiva/sclera: Conjunctivae normal.     Pupils: Pupils are equal, round, and reactive to light.  Cardiovascular:     Rate and Rhythm: Normal rate and regular rhythm.     Heart sounds: Normal heart sounds.  Pulmonary:     Effort: Pulmonary effort is  normal. No respiratory distress.     Breath sounds: Normal breath sounds.  Abdominal:     General: There is no distension.     Palpations: Abdomen is soft.  Musculoskeletal:        General: Normal range of motion.     Cervical back: Normal range of motion.     Comments: Index finger left hand has tenderness over the palmar aspect of the MCP joint.  Pain with flexion and extension of finger.  Tendon pulley is tender  Lymphadenopathy:     Cervical: Cervical adenopathy present.  Skin:    General: Skin is warm and dry.  Neurological:     General: No focal deficit present.     Mental Status: She is alert.      UC Treatments / Results  Labs (all labs ordered are listed, but only abnormal results are displayed) Labs Reviewed - No data to display  EKG   Radiology No results found.  Procedures Procedures (including critical care time)  Medications Ordered in UC Medications - No data to display  Initial Impression / Assessment and Plan / UC Course  I have reviewed the triage vital signs and the nursing notes.  Pertinent labs & imaging results that were available during my care of the patient were reviewed by me and considered in my medical decision making (see chart for details).     Final Clinical Impressions(s) / UC Diagnoses   Final diagnoses:  Right acute otitis media  Upper respiratory infection with cough and congestion  Flexor tendinitis  of hand     Discharge Instructions      For the respiratory and ear infection take Augmentin 2 times a day for a week.  May take over-the-counter cough and cold medicines as needed.  Take Diflucan at the first sign of yeast infection.  Take second pill at the end of the antibiotic course  Take prednisone once a day for 5 days.  Put ice on the tendinitis on your palm.  See Dr. Karie Schwalbe if it fails to improve     ED Prescriptions     Medication Sig Dispense Auth. Provider   amoxicillin-clavulanate (AUGMENTIN) 875-125 MG tablet  Take 1 tablet by mouth every 12 (twelve) hours. 14 tablet Eustace MooreNelson, Ilean Spradlin Sue, MD   fluconazole (DIFLUCAN) 150 MG tablet Take 1 tablet (150 mg total) by mouth daily. Repeat in 1 week if needed 2 tablet Eustace MooreNelson, Logon Uttech Sue, MD   predniSONE (DELTASONE) 20 MG tablet Take 2 tablets (40 mg total) by mouth daily with breakfast. 10 tablet Eustace MooreNelson, Neilson Oehlert Sue, MD      PDMP not reviewed this encounter.   Eustace MooreNelson, Tumeka Chimenti Sue, MD 02/03/22 1537

## 2022-02-03 NOTE — Telephone Encounter (Signed)
Call from [facility] regarding augmentin prescription- chart reviewed by Dr Delton See, pt can take Augmentin, may take benadryl if she develops a rash or call back to [facility] in the am. No other questions at this time.

## 2022-02-03 NOTE — Discharge Instructions (Addendum)
For the respiratory and ear infection take Augmentin 2 times a day for a week.  May take over-the-counter cough and cold medicines as needed.  Take Diflucan at the first sign of yeast infection.  Take second pill at the end of the antibiotic course  Take prednisone once a day for 5 days.  Put ice on the tendinitis on your palm.  See Dr. Karie Schwalbe if it fails to improve

## 2022-02-03 NOTE — ED Triage Notes (Signed)
Patient c/o head congestion, sore throat, bilateral ear pain, productive cough on and off x 2 weeks.  Patient has been taken Advil, Sudafed, Robitussin and Nyquil.  Denies taken a home COVID test.

## 2022-02-04 ENCOUNTER — Telehealth: Payer: Self-pay

## 2022-02-04 NOTE — Telephone Encounter (Signed)
TC to f/u with pt after yesterday's visit to [facility]. Pt states she is feeling better and has no questions at this time.

## 2022-02-12 ENCOUNTER — Ambulatory Visit: Payer: BC Managed Care – PPO | Admitting: Medical-Surgical

## 2022-02-19 ENCOUNTER — Other Ambulatory Visit: Payer: Self-pay | Admitting: Sports Medicine

## 2022-02-19 ENCOUNTER — Ambulatory Visit (INDEPENDENT_AMBULATORY_CARE_PROVIDER_SITE_OTHER): Payer: BC Managed Care – PPO

## 2022-02-19 ENCOUNTER — Ambulatory Visit (INDEPENDENT_AMBULATORY_CARE_PROVIDER_SITE_OTHER): Payer: BC Managed Care – PPO | Admitting: Sports Medicine

## 2022-02-19 ENCOUNTER — Encounter: Payer: Self-pay | Admitting: Sports Medicine

## 2022-02-19 VITALS — BP 140/98 | HR 92 | Wt 277.0 lb

## 2022-02-19 DIAGNOSIS — M778 Other enthesopathies, not elsewhere classified: Secondary | ICD-10-CM

## 2022-02-19 DIAGNOSIS — M7918 Myalgia, other site: Secondary | ICD-10-CM

## 2022-02-19 MED ORDER — CYCLOBENZAPRINE HCL 10 MG PO TABS
10.0000 mg | ORAL_TABLET | Freq: Every day | ORAL | 3 refills | Status: DC
Start: 2022-02-19 — End: 2022-04-01

## 2022-02-19 MED ORDER — TRIAMCINOLONE ACETONIDE 40 MG/ML IJ SUSP
40.0000 mg | Freq: Once | INTRAMUSCULAR | Status: AC
  Administered 2022-02-19: 40 mg via INTRAMUSCULAR

## 2022-02-19 NOTE — Addendum Note (Signed)
Addended by: Carren Rang A on: 02/19/2022 01:50 PM   Modules accepted: Orders

## 2022-02-19 NOTE — Progress Notes (Signed)
    Procedures performed today:    Procedure: Real-time Ultrasound Guided injection of the left second flexor tendon sheath Device: Samsung [id number]  Verbal informed consent obtained.  Time-out conducted.  Noted no overlying erythema, induration, or other signs of local infection.  Skin prepped in a sterile fashion.  Local anesthesia: Topical Ethyl chloride.  With sterile technique and under real time ultrasound guidance: Noted flexor tendinitis, 1/2 cc lidocaine, 1/2 cc kenalog 40 injected easily. Advised to call if fevers/chills, erythema, induration, drainage, or persistent bleeding.  Images permanently stored and available for review in PACS.  Impression: Technically successful ultrasound guided injection.  Independent interpretation of notes and tests performed by another provider:   None.  Brief History, Exam, Impression, and Recommendations:    Left hand tendonitis Pleasant 36 year old female, has had a couple months of pain and swelling left hand volar aspect in close association with the flexor tendon sheath to the index finger. Was seen in urgent care and got a course of prednisone which did not help. She does have visible and palpable swelling flexor digitorum tendon sheath proximal to the MCP, palpable flexor tendon nodule We injected this today, return to see me in 6 weeks.    ____________________________________________ [name]. Benjamin Stain, M.D., ABFM., CAQSM., AME. Primary Care and Sports Medicine [facility]  Adjunct Professor of Family Medicine  [employer] [facility] [employer]  Restaurant manager, fast food

## 2022-02-19 NOTE — Assessment & Plan Note (Signed)
Pleasant 36 year old female, has had a couple months of pain and swelling left hand volar aspect in close association with the flexor tendon sheath to the index finger. Was seen in urgent care and got a course of prednisone which did not help. She does have visible and palpable swelling flexor digitorum tendon sheath proximal to the MCP, palpable flexor tendon nodule We injected this today, return to see me in 6 weeks.

## 2022-02-22 ENCOUNTER — Ambulatory Visit (HOSPITAL_BASED_OUTPATIENT_CLINIC_OR_DEPARTMENT_OTHER): Payer: BC Managed Care – PPO | Admitting: Obstetrics & Gynecology

## 2022-02-26 ENCOUNTER — Encounter (HOSPITAL_BASED_OUTPATIENT_CLINIC_OR_DEPARTMENT_OTHER): Payer: Self-pay | Admitting: Obstetrics & Gynecology

## 2022-02-26 ENCOUNTER — Ambulatory Visit (INDEPENDENT_AMBULATORY_CARE_PROVIDER_SITE_OTHER): Payer: BC Managed Care – PPO | Admitting: Obstetrics & Gynecology

## 2022-02-26 VITALS — BP 140/85 | HR 85 | Ht 69.0 in | Wt 282.0 lb

## 2022-02-26 DIAGNOSIS — N911 Secondary amenorrhea: Secondary | ICD-10-CM

## 2022-02-26 DIAGNOSIS — N8501 Benign endometrial hyperplasia: Secondary | ICD-10-CM

## 2022-02-26 DIAGNOSIS — Z3202 Encounter for pregnancy test, result negative: Secondary | ICD-10-CM

## 2022-02-26 LAB — POCT URINE PREGNANCY: Preg Test, Ur: NEGATIVE

## 2022-02-26 MED ORDER — MEDROXYPROGESTERONE ACETATE 10 MG PO TABS: 10.0000 mg | ORAL_TABLET | Freq: Every day | ORAL | 0 refills | Status: DC

## 2022-02-26 NOTE — Progress Notes (Unsigned)
GYNECOLOGY  VISIT  CC:   follow up simple hyperplasia  HPI: 37 y.o. G0P0000 Married White or Caucasian female here for endometrial biopsy.  In May, hysteroscopy with D&C showed simple endometrial hyperplasia.  Has not been taking progesterone due to fertility treatment that has been done.    Last normal cycle was in July and was seven days.  Had egg retrieval in August.  Had two eggs.  One fertilized but chromosomes are abnormal.  Still frozen.  After egg retrieval had a two day cycle.  Has not had any bleeding since.  No female partner.     Past Medical History:  Diagnosis Date   Depression    Endometrial polyp    Fibromyalgia    History of recurrent UTIs    Hypertension    Infertility, anovulation    Myofascial muscle pain    OA (osteoarthritis) of knee    BILATERAL   PCOS (polycystic ovarian syndrome)    PONV (postoperative nausea and vomiting)    Seasonal allergic rhinitis     MEDS:   Current Outpatient Medications on File Prior to Visit  Medication Sig Dispense Refill   ALPRAZolam (XANAX) 0.5 MG tablet Take 1 tablet (0.5 mg total) by mouth 3 (three) times daily as needed for anxiety. 20 tablet 0   amoxicillin-clavulanate (AUGMENTIN) 875-125 MG tablet Take 1 tablet by mouth every 12 (twelve) hours. 14 tablet 0   cyclobenzaprine (FLEXERIL) 10 MG tablet Take 1 tablet (10 mg total) by mouth at bedtime. 90 tablet 3   fluconazole (DIFLUCAN) 150 MG tablet Take 1 tablet (150 mg total) by mouth daily. Repeat in 1 week if needed 2 tablet 0   FLUoxetine (PROZAC) 20 MG capsule Take 1 capsule (20 mg total) by mouth daily. 30 capsule 1   folic acid (FOLVITE) 751 MCG tablet Take 400 mcg by mouth daily.     labetalol (NORMODYNE) 100 MG tablet TAKE 1 TABLET BY MOUTH TWICE A DAY 180 tablet 1   loratadine (CLARITIN) 10 MG tablet Take 10 mg by mouth daily.     Multiple Vitamins-Minerals (OPURITY PO) Take 2 capsules by mouth once. Bariatric vitamin. One capsule daily     norethindrone (AYGESTIN)  5 MG tablet Take 1 tablet (5 mg total) by mouth daily. 30 tablet 11   NOVOFINE PLUS PEN NEEDLE 32G X 4 MM MISC USE WEEKLY FOR OZEMPIC INJECTIONS 100 each 11   predniSONE (DELTASONE) 20 MG tablet Take 2 tablets (40 mg total) by mouth daily with breakfast. 10 tablet 0   RYBELSUS 7 MG TABS TAKE 7 MG BY MOUTH DAILY. 30 tablet 3   Semaglutide, 2 MG/DOSE, 8 MG/3ML SOPN Inject 2 mg as directed once a week. Wednesday 3 mL 3   topiramate (TOPAMAX) 100 MG tablet Take 100 mg by mouth 2 (two) times daily.     No current facility-administered medications on file prior to visit.    ALLERGIES: Enalapril maleate, Erythromycin, Ceclor [cefaclor], Savella [milnacipran], and Sulfa antibiotics  SH:  partnered, non smoker  Review of Systems  Constitutional: Negative.   Genitourinary: Negative.     PHYSICAL EXAMINATION:    BP (!) 140/85 (BP Location: Right Arm, Patient Position: Sitting, Cuff Size: Large)   Pulse 85   Ht 5\' 9"  (1.753 m) Comment: Reported  Wt 282 lb (127.9 kg)   BMI 41.64 kg/m     General appearance: alert, cooperative and appears stated age Lymph:  no inguinal LAD noted  Pelvic: External genitalia:  no lesions  Urethra:  normal appearing urethra with no masses, tenderness or lesions              Bartholins and Skenes: normal                 Vagina: normal appearing vagina with normal color and discharge, no lesions              Cervix: no lesions              Bimanual Exam:  Uterus:  normal size, contour, position, consistency, mobility, non-tender              Adnexa: no mass, fullness, tenderness  Endometrial biopsy recommended.  Discussed with patient.  Verbal and written consent obtained.   Procedure:  Speculum placed.  Cervix visualized and cleansed with betadine prep.  A single toothed tenaculum was applied to the anterior lip of the cervix.  Attempt made to pass endometrial pipelle through cervix.  Milex dilator used as well.  Several attempts made and procedure  stopped due to discomfort.  Tenaculum removed.  Minimal bleeding noted.  Pt tolerated procedure well.  Chaperone, Octaviano Batty, CMA, was present for exam.  Assessment/Plan: 1. Simple endometrial hyperplasia - procedure not completed today.  Will start provera 10mg  x 10 days.  Advised pt to call and will attempt biopsy again when she is bleeding.  - feel pt should be on continued progesterone, micronor, or use cyclic progesterone if not doing any fertility treatments in the near future  2. Secondary amenorrhea - POCT urine pregnancy obtained prior to procedure.

## 2022-03-20 ENCOUNTER — Encounter (HOSPITAL_BASED_OUTPATIENT_CLINIC_OR_DEPARTMENT_OTHER): Payer: Self-pay | Admitting: Obstetrics & Gynecology

## 2022-03-24 ENCOUNTER — Telehealth: Payer: BC Managed Care – PPO | Admitting: Urgent Care

## 2022-03-24 DIAGNOSIS — N3000 Acute cystitis without hematuria: Secondary | ICD-10-CM | POA: Diagnosis not present

## 2022-03-24 MED ORDER — CIPROFLOXACIN HCL 500 MG PO TABS: 500.0000 mg | ORAL_TABLET | Freq: Two times a day (BID) | ORAL | 0 refills | Status: DC

## 2022-03-24 NOTE — Progress Notes (Signed)
E-Visit for Urinary Problems  We are sorry that you are not feeling well.  Here is how we plan to help!  Based on what you shared with me it looks like you most likely have a simple urinary tract infection.  A UTI (Urinary Tract Infection) is a bacterial infection of the bladder.  Most cases of urinary tract infections are simple to treat but a key part of your care is to encourage you to drink plenty of fluids and watch your symptoms carefully.  I have prescribed Ciprofloxacin 500 mg twice a day for 3 days.  Your symptoms should gradually improve. Call us if the burning in your urine worsens, you develop worsening fever, back pain or pelvic pain or if your symptoms do not resolve after completing the antibiotic.  Urinary tract infections can be prevented by drinking plenty of water to keep your body hydrated.  Also be sure when you wipe, wipe from front to back and don't hold it in!  If possible, empty your bladder every 4 hours.  HOME CARE Drink plenty of fluids Compete the full course of the antibiotics even if the symptoms resolve Remember, when you need to go.go. Holding in your urine can increase the likelihood of getting a UTI! GET HELP RIGHT AWAY IF: You cannot urinate You get a high fever Worsening back pain occurs You see blood in your urine You feel sick to your stomach or throw up You feel like you are going to pass out  MAKE SURE YOU  Understand these instructions. Will watch your condition. Will get help right away if you are not doing well or get worse.   Thank you for choosing an e-visit.  Your e-visit answers were reviewed by a board certified advanced clinical practitioner to complete your personal care plan. Depending upon the condition, your plan could have included both over the counter or prescription medications.  Please review your pharmacy choice. Make sure the pharmacy is open so you can pick up prescription now. If there is a problem, you may contact  your provider through [employer] and have the prescription routed to another pharmacy.  Your safety is important to [place]. If you have drug allergies check your prescription carefully.   For the next 24 hours you can use MyChart to ask questions about today's visit, request a non-urgent call back, or ask for a work or school excuse. You will get an email in the next two days asking about your experience. I hope that your e-visit has been valuable and will speed your recovery.   I have spent 5 minutes in review of e-visit questionnaire, review and updating patient chart, medical decision making and response to patient.   [place], [place]

## 2022-03-25 MED ORDER — NITROFURANTOIN MONOHYD MACRO 100 MG PO CAPS: 100.0000 mg | ORAL_CAPSULE | Freq: Two times a day (BID) | ORAL | 0 refills | Status: DC

## 2022-03-25 NOTE — Addendum Note (Signed)
Addended by: Brunetta Jeans on: 03/25/2022 06:52 AM   Modules accepted: Orders

## 2022-04-01 ENCOUNTER — Ambulatory Visit (INDEPENDENT_AMBULATORY_CARE_PROVIDER_SITE_OTHER): Payer: BC Managed Care – PPO | Admitting: Sports Medicine

## 2022-04-01 DIAGNOSIS — Z9884 Bariatric surgery status: Secondary | ICD-10-CM | POA: Diagnosis not present

## 2022-04-01 DIAGNOSIS — M79642 Pain in left hand: Secondary | ICD-10-CM

## 2022-04-01 DIAGNOSIS — E119 Type 2 diabetes mellitus without complications: Secondary | ICD-10-CM

## 2022-04-01 DIAGNOSIS — M79641 Pain in right hand: Secondary | ICD-10-CM | POA: Diagnosis not present

## 2022-04-01 DIAGNOSIS — G5601 Carpal tunnel syndrome, right upper limb: Secondary | ICD-10-CM | POA: Insufficient documentation

## 2022-04-01 DIAGNOSIS — M778 Other enthesopathies, not elsewhere classified: Secondary | ICD-10-CM | POA: Diagnosis not present

## 2022-04-01 MED ORDER — CYCLOBENZAPRINE HCL 10 MG PO TABS: 10.0000 mg | ORAL_TABLET | Freq: Every day | ORAL | 3 refills | Status: DC

## 2022-04-01 NOTE — Progress Notes (Addendum)
    Procedures performed today:    None.  Independent interpretation of notes and tests performed by another provider:   None.  Brief History, Exam, Impression, and Recommendations:    Bilateral hand pain Bilateral hand pain, unclear etiology, physical exam normal. She endorses pain particularly when picking up loss of grocery bags and they tend to cut into her hand. I told her to some degree this was normal, she does have history of gastric sleeve, diet controlled diabetes, carpal tunnel syndrome, peripheral neuropathy, B12 neuropathy also in the differential, we will check some labs and I would like her to get a nerve conduction and EMG with our neurologist next-door.  Update: EMG and nerve conduction study 06/27/2022 shows left C7 radiculopathy and mild right carpal tunnel syndrome   Left hand tendonitis Flexor tendinopathy completely resolved after left second flexor tendon sheath injection at the last visit. Swelling better. Return as needed for this.    ____________________________________________ [name]. Benjamin Stain, M.D., ABFM., CAQSM., AME. Primary Care and Sports Medicine [facility]  Adjunct Professor of Family Medicine  [employer]  Restaurant manager, fast food

## 2022-04-01 NOTE — Assessment & Plan Note (Addendum)
Bilateral hand pain, unclear etiology, physical exam normal. She endorses pain particularly when picking up loss of grocery bags and they tend to cut into her hand. I told her to some degree this was normal, she does have history of gastric sleeve, diet controlled diabetes, carpal tunnel syndrome, peripheral neuropathy, B12 neuropathy also in the differential, we will check some labs and I would like her to get a nerve conduction and EMG with our neurologist next-door.  Update: EMG and nerve conduction study 06/27/2022 shows left C7 radiculopathy and mild right carpal tunnel syndrome

## 2022-04-01 NOTE — Assessment & Plan Note (Signed)
Flexor tendinopathy completely resolved after left second flexor tendon sheath injection at the last visit. Swelling better. Return as needed for this.

## 2022-04-02 LAB — COMPLETE METABOLIC PANEL WITH GFR
AG Ratio: 1.5 (calc) (ref 1.0–2.5)
ALT: 17 U/L (ref 6–29)
AST: 11 U/L (ref 10–30)
Albumin: 3.5 g/dL — ABNORMAL LOW (ref 3.6–5.1)
Alkaline phosphatase (APISO): 65 U/L (ref 31–125)
BUN: 10 mg/dL (ref 7–25)
CO2: 25 mmol/L (ref 20–32)
Calcium: 8.7 mg/dL (ref 8.6–10.2)
Chloride: 104 mmol/L (ref 98–110)
Creat: 0.75 mg/dL (ref 0.50–0.97)
Globulin: 2.4 g/dL (calc) (ref 1.9–3.7)
Glucose, Bld: 195 mg/dL — ABNORMAL HIGH (ref 65–139)
Potassium: 3.7 mmol/L (ref 3.5–5.3)
Sodium: 141 mmol/L (ref 135–146)
Total Bilirubin: 0.5 mg/dL (ref 0.2–1.2)
Total Protein: 5.9 g/dL — ABNORMAL LOW (ref 6.1–8.1)
eGFR: 107 mL/min/{1.73_m2} (ref >=60)

## 2022-04-02 LAB — HEMOGLOBIN A1C
Hgb A1c MFr Bld: 5.4 % of total Hgb (ref <5.7)
Mean Plasma Glucose: 108 mg/dL
eAG (mmol/L): 6 mmol/L

## 2022-04-02 LAB — CBC
HCT: 40.4 % (ref 35.0–45.0)
Hemoglobin: 13.7 g/dL (ref 11.7–15.5)
MCH: 29.1 pg (ref 27.0–33.0)
MCHC: 33.9 g/dL (ref 32.0–36.0)
MCV: 85.8 fL (ref 80.0–100.0)
MPV: 11.6 fL (ref 7.5–12.5)
Platelets: 206 10*3/uL (ref 140–400)
RBC: 4.71 10*6/uL (ref 3.80–5.10)
RDW: 12.7 % (ref 11.0–15.0)
WBC: 10.3 10*3/uL (ref 3.8–10.8)

## 2022-04-02 LAB — VITAMIN B12: Vitamin B-12: 442 pg/mL (ref 200–1100)

## 2022-04-02 LAB — LIPID PANEL
Cholesterol: 167 mg/dL (ref <200)
HDL: 38 mg/dL — ABNORMAL LOW (ref >=50)
LDL Cholesterol (Calc): 98 mg/dL (calc)
Non-HDL Cholesterol (Calc): 129 mg/dL (calc) (ref <130)
Total CHOL/HDL Ratio: 4.4 (calc) (ref <5.0)
Triglycerides: 215 mg/dL — ABNORMAL HIGH (ref <150)

## 2022-04-02 LAB — TSH: TSH: 2.11 mIU/L

## 2022-04-19 ENCOUNTER — Ambulatory Visit
Admission: EM | Admit: 2022-04-19 | Discharge: 2022-04-19 | Disposition: A | Discharge status: Home / Self Care | Payer: BC Managed Care – PPO | Attending: Urgent Care | Admitting: Urgent Care

## 2022-04-19 ENCOUNTER — Encounter: Payer: Self-pay | Admitting: Emergency Medicine

## 2022-04-19 DIAGNOSIS — R300 Dysuria: Secondary | ICD-10-CM | POA: Diagnosis not present

## 2022-04-19 DIAGNOSIS — B3731 Acute candidiasis of vulva and vagina: Secondary | ICD-10-CM | POA: Diagnosis not present

## 2022-04-19 DIAGNOSIS — N760 Acute vaginitis: Secondary | ICD-10-CM

## 2022-04-19 DIAGNOSIS — Z79899 Other long term (current) drug therapy: Secondary | ICD-10-CM | POA: Insufficient documentation

## 2022-04-19 DIAGNOSIS — N898 Other specified noninflammatory disorders of vagina: Secondary | ICD-10-CM | POA: Diagnosis not present

## 2022-04-19 DIAGNOSIS — I10 Essential (primary) hypertension: Secondary | ICD-10-CM | POA: Diagnosis not present

## 2022-04-19 DIAGNOSIS — M545 Low back pain, unspecified: Secondary | ICD-10-CM | POA: Diagnosis not present

## 2022-04-19 DIAGNOSIS — Z87440 Personal history of urinary (tract) infections: Secondary | ICD-10-CM | POA: Diagnosis not present

## 2022-04-19 LAB — POCT URINALYSIS DIP (MANUAL ENTRY)
Bilirubin, UA: NEGATIVE
Blood, UA: NEGATIVE
Glucose, UA: NEGATIVE mg/dL
Ketones, POC UA: NEGATIVE mg/dL
Leukocytes, UA: NEGATIVE
Nitrite, UA: NEGATIVE
Protein Ur, POC: NEGATIVE mg/dL
Spec Grav, UA: >=1.03 — AB (ref 1.010–1.025)
Urobilinogen, UA: 0.2 E.U./dL
pH, UA: 5.5 (ref 5.0–8.0)

## 2022-04-19 LAB — POCT URINE PREGNANCY: Preg Test, Ur: NEGATIVE

## 2022-04-19 MED ORDER — FLUCONAZOLE 150 MG PO TABS: 150.0000 mg | ORAL_TABLET | ORAL | 0 refills | Status: DC

## 2022-04-19 NOTE — ED Provider Notes (Signed)
[facility]  Note:  This document was prepared using Dragon voice recognition software and may include unintentional dictation errors.  MRN: [id number] DOB: [birth year]  Subjective:   Chelsea [name] is a 37 y.o. female presenting for 3 week history of intermittent dysuria, low back pains, cloudy urine. Has also had labial and clitoral irritation. Denies fever, n/v, abdominal pain, pelvic pain, rashes, hematuria, vaginal discharge.  Has sex with her wife only. No concerns for STI. Does not drink water, primarily drinks sweet tea and carbonated flavored waters. Has a history of UTIs.   No current facility-administered medications for this encounter.  Current Outpatient Medications:    ALPRAZolam (XANAX) 0.5 MG tablet, Take 1 tablet (0.5 mg total) by mouth 3 (three) times daily as needed for anxiety., Disp: 20 tablet, Rfl: 0   cyclobenzaprine (FLEXERIL) 10 MG tablet, Take 1 tablet (10 mg total) by mouth at bedtime., Disp: 90 tablet, Rfl: 3   fluconazole (DIFLUCAN) 150 MG tablet, Take 1 tablet (150 mg total) by mouth daily. Repeat in 1 week if needed (Patient not taking: Reported on 04/19/2022), Disp: 2 tablet, Rfl: 0   FLUoxetine (PROZAC) 20 MG capsule, Take 1 capsule (20 mg total) by mouth daily., Disp: 30 capsule, Rfl: 1   folic acid (FOLVITE) Q000111Q MCG tablet, Take 400 mcg by mouth daily. (Patient not taking: Reported on 04/19/2022), Disp: , Rfl:    labetalol (NORMODYNE) 100 MG tablet, TAKE 1 TABLET BY MOUTH TWICE A DAY, Disp: 180 tablet, Rfl: 1   loratadine (CLARITIN) 10 MG tablet, Take 10 mg by mouth daily., Disp: , Rfl:    medroxyPROGESTERone (PROVERA) 10 MG tablet, Take 1 tablet (10 mg total) by mouth daily. (Patient not taking: Reported on 04/19/2022), Disp: 10 tablet, Rfl: 0   Multiple Vitamins-Minerals (OPURITY PO), Take 2 capsules by mouth once. Bariatric vitamin. One capsule daily, Disp: , Rfl:    nitrofurantoin, macrocrystal-monohydrate, (MACROBID) 100 MG capsule,  Take 1 capsule (100 mg total) by mouth 2 (two) times daily. (Patient not taking: Reported on 04/19/2022), Disp: 10 capsule, Rfl: 0   norethindrone (AYGESTIN) 5 MG tablet, Take 1 tablet (5 mg total) by mouth daily. (Patient not taking: Reported on 04/19/2022), Disp: 30 tablet, Rfl: 11   NOVOFINE PLUS PEN NEEDLE 32G X 4 MM MISC, USE WEEKLY FOR OZEMPIC INJECTIONS, Disp: 100 each, Rfl: 11   predniSONE (DELTASONE) 20 MG tablet, Take 2 tablets (40 mg total) by mouth daily with breakfast. (Patient not taking: Reported on 04/19/2022), Disp: 10 tablet, Rfl: 0   RYBELSUS 7 MG TABS, TAKE 7 MG BY MOUTH DAILY., Disp: 30 tablet, Rfl: 3   Semaglutide, 2 MG/DOSE, 8 MG/3ML SOPN, Inject 2 mg as directed once a week. Wednesday (Patient not taking: Reported on 04/19/2022), Disp: 3 mL, Rfl: 3   topiramate (TOPAMAX) 100 MG tablet, Take 100 mg by mouth 2 (two) times daily., Disp: , Rfl:    Allergies  Allergen Reactions   Enalapril Maleate Swelling    lip swelling   Erythromycin Diarrhea    Severe yeast infection   Ceclor [Cefaclor] Hives    Caused yeast infection   Savella [Milnacipran] Hypertension   Sulfa Antibiotics Hives and Rash    Per mom at bedside    Past Medical History:  Diagnosis Date   Depression    Endometrial polyp    Fibromyalgia    History of recurrent UTIs    Hypertension    Infertility, anovulation    Myofascial muscle pain  OA (osteoarthritis) of knee    BILATERAL   PCOS (polycystic ovarian syndrome)    PONV (postoperative nausea and vomiting)    Seasonal allergic rhinitis      Past Surgical History:  Procedure Laterality Date   CHOLECYSTECTOMY N/A 07/11/2021   Procedure: LAPAROSCOPIC CHOLECYSTECTOMY WITH INTRAOPERATIVE CHOLANGIOGRAM;  Surgeon: Donnie Mesa, MD;  Location: [facility];  Service: General;  Laterality: N/A;   HYSTEROSCOPY WITH D & C N/A 09/07/2014   Procedure:  DILATATION AND CURETTAGE /HYSTEROSCOPY/POLYPECTOMY;  Surgeon: Governor Specking, MD;  Location: [facility];  Service: Gynecology;  Laterality: N/A;   HYSTEROSCOPY WITH D & C N/A 07/25/2021   Procedure: DILATATION AND CURETTAGE /HYSTEROSCOPY;  Surgeon: Megan Salon, MD;  Location: [place];  Service: Gynecology;  Laterality: N/A;   I & D RIGHT FOOT  1998   LAPAROSCOPIC GASTRIC SLEEVE RESECTION N/A 10/20/2017   Procedure: LAPAROSCOPIC GASTRIC SLEEVE RESECTION, UPPER ENDO, ERAS Pathway;  Surgeon: Clovis Riley, MD;  Location: [facility];  Service: General;  Laterality: N/A;   POLYPECTOMY     uterine   TONSILLECTOMY  2001   WISDOM TOOTH EXTRACTION  2004    Family History  Problem Relation Age of Onset   Diabetes Mother    Polycystic ovary syndrome Mother    Hypertension Father    Diabetes Father    Fibromyalgia Maternal Aunt    Fibromyalgia Cousin    Cancer Paternal Grandfather        prostate   Glaucoma Paternal Grandmother     Social History   Tobacco Use   Smoking status: Never   Smokeless tobacco: Never  Vaping Use   Vaping Use: Never used  Substance Use Topics   Alcohol use: No    Alcohol/week: 0.0 standard drinks of alcohol   Drug use: No    ROS   Objective:   Vitals: BP (!) 148/103 (BP Location: Right Wrist) Comment: HTN hx, restarted BP meds 1 week ago  Pulse 96   Temp 99.6 F (37.6 C) (Oral)   Resp 16   Ht '5\' 9"'$  (1.753 m)   Wt 277 lb (125.6 kg)   SpO2 96%   BMI 40.91 kg/m   Physical Exam Constitutional:      General: She is not in acute distress.    Appearance: Normal appearance. She is well-developed. She is not ill-appearing, toxic-appearing or diaphoretic.  HENT:     Head: Normocephalic and atraumatic.     Nose: Nose normal.     Mouth/Throat:     Mouth: Mucous membranes are moist.  Eyes:     General: No scleral icterus.       Right eye: No discharge.        Left eye: No discharge.     Extraocular Movements: Extraocular movements intact.  Cardiovascular:     Rate and Rhythm: Normal rate.  Pulmonary:     Effort:  Pulmonary effort is normal.  Skin:    General: Skin is warm and dry.  Neurological:     General: No focal deficit present.     Mental Status: She is alert and oriented to person, place, and time.  Psychiatric:        Mood and Affect: Mood normal.        Behavior: Behavior normal.     Results for orders placed or performed during the hospital encounter of 04/19/22 (from the past 24 hour(s))  POCT urinalysis dipstick     Status: Abnormal  Collection Time: 04/19/22  4:19 PM  Result Value Ref Range   Color, UA yellow yellow   Clarity, UA clear clear   Glucose, UA negative negative mg/dL   Bilirubin, UA negative negative   Ketones, POC UA negative negative mg/dL   Spec Grav, UA >=1.030 (A) 1.010 - 1.025   Blood, UA negative negative   pH, UA 5.5 5.0 - 8.0   Protein Ur, POC negative negative mg/dL   Urobilinogen, UA 0.2 0.2 or 1.0 E.U./dL   Nitrite, UA Negative Negative   Leukocytes, UA Negative Negative  POCT urine pregnancy     Status: None   Collection Time: 04/19/22  4:19 PM  Result Value Ref Range   Preg Test, Ur Negative Negative    Assessment and Plan :   PDMP not reviewed this encounter.  1. Acute vaginitis   2. Dysuria     Will treat for yeast vaginitis empirically with fluconazole.  Recommended consistent hydration, limiting urinary irritants. Vaginal swab and urine culture results are pending. Counseled patient on potential for adverse effects with medications prescribed/recommended today, ER and return-to-clinic precautions discussed, patient verbalized understanding.    Jaynee Eagles, [place] 04/19/22 [phone]

## 2022-04-19 NOTE — ED Triage Notes (Addendum)
Burning w/ urination intermittent 3 weeks w/ back pain Cloudy urine  Irritation w/ clitoral stimulation  Same sex  partner - wife

## 2022-04-19 NOTE — Discharge Instructions (Signed)
Please start fluconazole to address acute vaginitis, yeast vaginitis. Make sure you hydrate very well with plain water and a quantity of 64 ounces of water a day.  Please limit drinks that are considered urinary irritants such as soda, sweet tea, coffee, energy drinks, alcohol.  These can worsen your urinary and genital symptoms but also be the source of them.  I will let you know about your vaginal swab and urine culture results through MyChart to see if we need to prescribe or change your antibiotics based off of those results.

## 2022-04-20 LAB — URINE CULTURE

## 2022-04-22 LAB — CERVICOVAGINAL ANCILLARY ONLY
Bacterial Vaginitis (gardnerella): NEGATIVE
Candida Glabrata: NEGATIVE
Candida Vaginitis: NEGATIVE
Comment: NEGATIVE
Comment: NEGATIVE
Comment: NEGATIVE

## 2022-04-26 ENCOUNTER — Other Ambulatory Visit: Payer: Self-pay | Admitting: Sports Medicine

## 2022-04-26 DIAGNOSIS — M7918 Myalgia, other site: Secondary | ICD-10-CM

## 2022-05-01 ENCOUNTER — Encounter (INDEPENDENT_AMBULATORY_CARE_PROVIDER_SITE_OTHER): Payer: BC Managed Care – PPO | Admitting: Sports Medicine

## 2022-05-01 DIAGNOSIS — M7918 Myalgia, other site: Secondary | ICD-10-CM | POA: Diagnosis not present

## 2022-05-01 NOTE — Telephone Encounter (Signed)
I spent 5 total minutes of online digital evaluation and management services in this patient-initiated request for online care. 

## 2022-05-07 ENCOUNTER — Ambulatory Visit (HOSPITAL_BASED_OUTPATIENT_CLINIC_OR_DEPARTMENT_OTHER): Payer: BC Managed Care – PPO | Admitting: Obstetrics & Gynecology

## 2022-05-07 VITALS — BP 130/89 | HR 102 | Ht 67.0 in | Wt 275.0 lb

## 2022-05-07 DIAGNOSIS — M2550 Pain in unspecified joint: Secondary | ICD-10-CM | POA: Diagnosis not present

## 2022-05-07 DIAGNOSIS — Z9289 Personal history of other medical treatment: Secondary | ICD-10-CM

## 2022-05-07 DIAGNOSIS — N912 Amenorrhea, unspecified: Secondary | ICD-10-CM

## 2022-05-07 DIAGNOSIS — M47819 Spondylosis without myelopathy or radiculopathy, site unspecified: Secondary | ICD-10-CM | POA: Diagnosis not present

## 2022-05-07 DIAGNOSIS — M25561 Pain in right knee: Secondary | ICD-10-CM | POA: Diagnosis not present

## 2022-05-07 DIAGNOSIS — M79671 Pain in right foot: Secondary | ICD-10-CM | POA: Diagnosis not present

## 2022-05-07 DIAGNOSIS — M79642 Pain in left hand: Secondary | ICD-10-CM | POA: Diagnosis not present

## 2022-05-07 DIAGNOSIS — M791 Myalgia, unspecified site: Secondary | ICD-10-CM | POA: Diagnosis not present

## 2022-05-07 DIAGNOSIS — R102 Pelvic and perineal pain: Secondary | ICD-10-CM | POA: Diagnosis not present

## 2022-05-07 DIAGNOSIS — M79641 Pain in right hand: Secondary | ICD-10-CM | POA: Diagnosis not present

## 2022-05-07 DIAGNOSIS — M549 Dorsalgia, unspecified: Secondary | ICD-10-CM | POA: Diagnosis not present

## 2022-05-07 DIAGNOSIS — M25562 Pain in left knee: Secondary | ICD-10-CM | POA: Diagnosis not present

## 2022-05-07 DIAGNOSIS — L409 Psoriasis, unspecified: Secondary | ICD-10-CM | POA: Diagnosis not present

## 2022-05-07 DIAGNOSIS — M79672 Pain in left foot: Secondary | ICD-10-CM | POA: Diagnosis not present

## 2022-05-07 MED ORDER — MISOPROSTOL 200 MCG PO TABS: ORAL_TABLET | ORAL | 0 refills | Status: DC

## 2022-05-07 NOTE — Progress Notes (Signed)
GYNECOLOGY  VISIT  CC:   needs endometrial biopsy  HPI: 37 y.o. G0P0000 Married White or Caucasian female here for discussion proceeding with endometrial biopsy.  Took provera and did not have any bleeding.  Will plan to repeat [place] and prolactin today.  If these are negative, will proceed with scheduling.  Prior attempt was unsuccessful due what seemed like cervical stenosis.  Will treat with cytotec prior to biopsy and proceed with ultrasound guidance as well.  Reviewed this with pt as well as administration of cytotec.  She has been experiencing some recent pelvic pressure.  Denies dysuria.  No back pain.  Will test urine culture today.   Past Medical History:  Diagnosis Date   Depression    Endometrial polyp    Fibromyalgia    History of recurrent UTIs    Hypertension    Infertility, anovulation    Myofascial muscle pain    OA (osteoarthritis) of knee    BILATERAL   PCOS (polycystic ovarian syndrome)    PONV (postoperative nausea and vomiting)    Seasonal allergic rhinitis     MEDS:   Current Outpatient Medications on File Prior to Visit  Medication Sig Dispense Refill   ALPRAZolam (XANAX) 0.5 MG tablet Take 1 tablet (0.5 mg total) by mouth 3 (three) times daily as needed for anxiety. 20 tablet 0   cyclobenzaprine (FLEXERIL) 10 MG tablet Take 1 tablet (10 mg total) by mouth at bedtime. 90 tablet 3   FLUoxetine (PROZAC) 20 MG capsule Take 1 capsule (20 mg total) by mouth daily. 30 capsule 1   labetalol (NORMODYNE) 100 MG tablet TAKE 1 TABLET BY MOUTH TWICE A DAY 180 tablet 1   loratadine (CLARITIN) 10 MG tablet Take 10 mg by mouth daily.     Multiple Vitamins-Minerals (OPURITY PO) Take 2 capsules by mouth once. Bariatric vitamin. One capsule daily     NOVOFINE PLUS PEN NEEDLE 32G X 4 MM MISC USE WEEKLY FOR OZEMPIC INJECTIONS 100 each 11   RYBELSUS 7 MG TABS TAKE 7 MG BY MOUTH DAILY. 30 tablet 3   topiramate (TOPAMAX) 100 MG tablet Take 100 mg by mouth 2 (two) times daily.      fluconazole (DIFLUCAN) 150 MG tablet Take 1 tablet (150 mg total) by mouth every 3 (three) days. Repeat in 1 week if needed (Patient not taking: Reported on 05/07/2022) 5 tablet 0   folic acid (FOLVITE) Q000111Q MCG tablet Take 400 mcg by mouth daily. (Patient not taking: Reported on 04/19/2022)     medroxyPROGESTERone (PROVERA) 10 MG tablet Take 1 tablet (10 mg total) by mouth daily. (Patient not taking: Reported on 04/19/2022) 10 tablet 0   norethindrone (AYGESTIN) 5 MG tablet Take 1 tablet (5 mg total) by mouth daily. (Patient not taking: Reported on 04/19/2022) 30 tablet 11   Semaglutide, 2 MG/DOSE, 8 MG/3ML SOPN Inject 2 mg as directed once a week. Wednesday (Patient not taking: Reported on 04/19/2022) 3 mL 3   No current facility-administered medications on file prior to visit.    ALLERGIES: Enalapril maleate, Erythromycin, Ceclor [cefaclor], Savella [milnacipran], and Sulfa antibiotics  SH:  partnered, non smoker  Review of Systems  Constitutional: Negative.   Genitourinary: Negative.     PHYSICAL EXAMINATION:    BP 130/89   Pulse (!) 102   Ht 5\' 7"  (1.702 m)   Wt 275 lb (124.7 kg)   BMI 43.07 kg/m     General appearance: alert, cooperative and appears stated age  Assessment/Plan: 1.  Pelvic pressure in female - Urine Culture  2. History of endometrial biopsy - misoprostol (CYTOTEC) 200 MCG tablet; Place 2 tablets buccally about two hours before procedure  Dispense: 2 tablet; Refill: 0  3. Amenorrhea - Prolactin - Follicle stimulating hormone

## 2022-05-08 LAB — FOLLICLE STIMULATING HORMONE: FSH: 4.4 m[IU]/mL

## 2022-05-08 LAB — PROLACTIN: Prolactin: 5.6 ng/mL (ref 4.8–33.4)

## 2022-05-11 LAB — URINE CULTURE

## 2022-05-13 ENCOUNTER — Other Ambulatory Visit (HOSPITAL_BASED_OUTPATIENT_CLINIC_OR_DEPARTMENT_OTHER): Payer: Self-pay | Admitting: *Deleted

## 2022-05-13 MED ORDER — AMOXICILLIN 500 MG PO CAPS: 500.0000 mg | ORAL_CAPSULE | Freq: Four times a day (QID) | ORAL | 0 refills | Status: AC

## 2022-05-14 ENCOUNTER — Encounter (HOSPITAL_BASED_OUTPATIENT_CLINIC_OR_DEPARTMENT_OTHER): Payer: Self-pay | Admitting: Obstetrics & Gynecology

## 2022-05-15 ENCOUNTER — Other Ambulatory Visit (HOSPITAL_COMMUNITY)
Admission: RE | Admit: 2022-05-15 | Discharge: 2022-05-15 | Disposition: A | Discharge status: Home / Self Care | Payer: BC Managed Care – PPO | Source: Ambulatory Visit | Attending: Obstetrics & Gynecology | Admitting: Obstetrics & Gynecology

## 2022-05-15 ENCOUNTER — Other Ambulatory Visit (HOSPITAL_BASED_OUTPATIENT_CLINIC_OR_DEPARTMENT_OTHER): Payer: Self-pay | Admitting: Obstetrics & Gynecology

## 2022-05-15 ENCOUNTER — Encounter (HOSPITAL_BASED_OUTPATIENT_CLINIC_OR_DEPARTMENT_OTHER): Payer: Self-pay | Admitting: Obstetrics & Gynecology

## 2022-05-15 ENCOUNTER — Ambulatory Visit (HOSPITAL_BASED_OUTPATIENT_CLINIC_OR_DEPARTMENT_OTHER): Payer: BC Managed Care – PPO | Admitting: Obstetrics & Gynecology

## 2022-05-15 ENCOUNTER — Ambulatory Visit (INDEPENDENT_AMBULATORY_CARE_PROVIDER_SITE_OTHER): Payer: BC Managed Care – PPO

## 2022-05-15 VITALS — BP 138/97 | HR 94 | Ht 67.0 in | Wt 273.2 lb

## 2022-05-15 DIAGNOSIS — N912 Amenorrhea, unspecified: Secondary | ICD-10-CM

## 2022-05-15 DIAGNOSIS — Z8742 Personal history of other diseases of the female genital tract: Secondary | ICD-10-CM

## 2022-05-15 DIAGNOSIS — N8500 Endometrial hyperplasia, unspecified: Secondary | ICD-10-CM | POA: Diagnosis not present

## 2022-05-17 DIAGNOSIS — B9689 Other specified bacterial agents as the cause of diseases classified elsewhere: Secondary | ICD-10-CM | POA: Diagnosis not present

## 2022-05-17 DIAGNOSIS — D485 Neoplasm of uncertain behavior of skin: Secondary | ICD-10-CM | POA: Diagnosis not present

## 2022-05-17 DIAGNOSIS — L02821 Furuncle of head [any part, except face]: Secondary | ICD-10-CM | POA: Diagnosis not present

## 2022-05-17 DIAGNOSIS — L304 Erythema intertrigo: Secondary | ICD-10-CM | POA: Diagnosis not present

## 2022-05-17 LAB — SURGICAL PATHOLOGY

## 2022-05-19 ENCOUNTER — Other Ambulatory Visit (HOSPITAL_BASED_OUTPATIENT_CLINIC_OR_DEPARTMENT_OTHER): Payer: Self-pay | Admitting: Obstetrics & Gynecology

## 2022-05-19 NOTE — Progress Notes (Signed)
GYNECOLOGY  VISIT  CC:   endometrial biopsy with ultrasound guidance  HPI: 37 y.o. G0P0000 Married White or Caucasian female here for endometrial biopsy with ultrasound guidance due to cervical stenosis.  Attempt made with biopsy previously that was unsuccessful.  She has used cytotec prior to procedure.  Consent obtained.   Past Medical History:  Diagnosis Date   Depression    Endometrial polyp    Fibromyalgia    History of recurrent UTIs    Hypertension    Infertility, anovulation    Myofascial muscle pain    OA (osteoarthritis) of knee    BILATERAL   PCOS (polycystic ovarian syndrome)    PONV (postoperative nausea and vomiting)    Seasonal allergic rhinitis     MEDS:   Current Outpatient Medications on File Prior to Visit  Medication Sig Dispense Refill   ALPRAZolam (XANAX) 0.5 MG tablet Take 1 tablet (0.5 mg total) by mouth 3 (three) times daily as needed for anxiety. 20 tablet 0   cyclobenzaprine (FLEXERIL) 10 MG tablet Take 1 tablet (10 mg total) by mouth at bedtime. 90 tablet 3   FLUoxetine (PROZAC) 20 MG capsule Take 1 capsule (20 mg total) by mouth daily. 30 capsule 1   labetalol (NORMODYNE) 100 MG tablet TAKE 1 TABLET BY MOUTH TWICE A DAY 180 tablet 1   loratadine (CLARITIN) 10 MG tablet Take 10 mg by mouth daily.     misoprostol (CYTOTEC) 200 MCG tablet Place 2 tablets buccally about two hours before procedure 2 tablet 0   Multiple Vitamins-Minerals (OPURITY PO) Take 2 capsules by mouth once. Bariatric vitamin. One capsule daily     NOVOFINE PLUS PEN NEEDLE 32G X 4 MM MISC USE WEEKLY FOR OZEMPIC INJECTIONS 100 each 11   RYBELSUS 7 MG TABS TAKE 7 MG BY MOUTH DAILY. 30 tablet 3   topiramate (TOPAMAX) 100 MG tablet Take 100 mg by mouth 2 (two) times daily.     fluconazole (DIFLUCAN) 150 MG tablet Take 1 tablet (150 mg total) by mouth every 3 (three) days. Repeat in 1 week if needed (Patient not taking: Reported on 05/07/2022) 5 tablet 0   folic acid (FOLVITE) [id number] MCG  tablet Take 400 mcg by mouth daily. (Patient not taking: Reported on 04/19/2022)     medroxyPROGESTERone (PROVERA) 10 MG tablet Take 1 tablet (10 mg total) by mouth daily. (Patient not taking: Reported on 04/19/2022) 10 tablet 0   norethindrone (AYGESTIN) 5 MG tablet Take 1 tablet (5 mg total) by mouth daily. (Patient not taking: Reported on 04/19/2022) 30 tablet 11   Semaglutide, 2 MG/DOSE, 8 MG/3ML SOPN Inject 2 mg as directed once a week. Wednesday (Patient not taking: Reported on 04/19/2022) 3 mL 3   No current facility-administered medications on file prior to visit.    ALLERGIES: Enalapril maleate, Erythromycin, Ceclor [cefaclor], Savella [milnacipran], and Sulfa antibiotics  SH:  partnered, non smoker  Review of Systems  Constitutional: Negative.   Genitourinary: Negative.     PHYSICAL EXAMINATION:    BP (!) 138/97 (BP Location: Left Arm, Patient Position: Sitting, Cuff Size: Large)   Pulse 94   Ht 5\' 7"  (1.702 m) Comment: Reported  Wt 273 lb 3.2 oz (123.9 kg)   BMI 42.79 kg/m     General appearance: alert, cooperative and appears stated age  Pelvic: External genitalia:  no lesions              Urethra:  normal appearing urethra with no masses, tenderness or lesions  Bartholins and Skenes: normal                 Vagina: normal appearing vagina with normal color and discharge, no lesions              Cervix: no lesions             Endometrial biopsy recommended.  Discussed with patient.  Verbal and written consent obtained.   Procedure:  Speculum placed.  Cervix visualized and cleansed with betadine prep.  A single toothed tenaculum was not applied to the anterior lip of the cervix.  Endometrial pipelle was advanced through the cervix into the endometrial cavity without difficulty.  Pipelle passed to 3m.  Suction applied and pipelle removed with good tissue sample obtained.  Tenculum removed.  No bleeding noted.  Patient tolerated procedure well.             Assessment/Plan: 1. History of endometrial hyperplasia - Surgical pathology( [place]/ [place])

## 2022-05-22 ENCOUNTER — Ambulatory Visit (INDEPENDENT_AMBULATORY_CARE_PROVIDER_SITE_OTHER): Payer: BC Managed Care – PPO | Admitting: *Deleted

## 2022-05-22 ENCOUNTER — Encounter (HOSPITAL_BASED_OUTPATIENT_CLINIC_OR_DEPARTMENT_OTHER): Payer: Self-pay | Admitting: Obstetrics & Gynecology

## 2022-05-22 ENCOUNTER — Encounter: Payer: Self-pay | Admitting: Sports Medicine

## 2022-05-22 VITALS — BP 126/92 | HR 90 | Ht 67.0 in | Wt 273.0 lb

## 2022-05-22 DIAGNOSIS — R300 Dysuria: Secondary | ICD-10-CM | POA: Diagnosis not present

## 2022-05-22 LAB — POCT URINALYSIS DIPSTICK
Appearance: NORMAL
Bilirubin, UA: NEGATIVE
Glucose, UA: NEGATIVE
Ketones, UA: NEGATIVE
Nitrite, UA: POSITIVE
Odor: ABNORMAL
Protein, UA: NEGATIVE
Spec Grav, UA: 1.025 (ref 1.010–1.025)
Urobilinogen, UA: 0.2 E.U./dL
pH, UA: 7.5 (ref 5.0–8.0)

## 2022-05-22 MED ORDER — SULFAMETHOXAZOLE-TRIMETHOPRIM 800-160 MG PO TABS: 1.0000 | ORAL_TABLET | Freq: Two times a day (BID) | ORAL | 0 refills | Status: AC

## 2022-05-22 NOTE — Progress Notes (Addendum)
Pt presents to office with complaints of burning with urination. She was recently treated for a UTI and reports that symptoms improved after taking antibiotics. Pt reports new onset of burning after being intimate with her partner. Clean catch urine obtained. Urine dip positive for nitrates. Rx sent to pharmacy for treatment. Pt to let us know if symptoms do not improve.     Patient was assessed and managed by nursing staff during this encounter. I have reviewed the chart and agree with the documentation and plan. I have also made any necessary editorial changes.  Megan Salon, MD 05/22/2022 5:24 PM

## 2022-05-24 LAB — URINE CULTURE

## 2022-05-29 NOTE — Telephone Encounter (Signed)
Patient came into office to drop off Handicap Placard form to be completed by PCP, forms placed in Dr. Mcneil Sober box, thanks.

## 2022-05-30 NOTE — Telephone Encounter (Signed)
Patient came into office to pick up form on 05/30/22, thanks.

## 2022-06-06 ENCOUNTER — Telehealth (HOSPITAL_BASED_OUTPATIENT_CLINIC_OR_DEPARTMENT_OTHER): Payer: Self-pay | Admitting: *Deleted

## 2022-06-06 NOTE — Telephone Encounter (Signed)
-----   Message from Jerene Bears, MD sent at 05/24/2022  7:45 AM EDT ----- Chelsea [name] Pt spoke to me on Friday about her MRI.  She did have one in 2022 at [facility] so I hadn't seen it.  Still think we may need to do one but it would be appropriate to try 3 months of estradiol patch with cyclic provera to see if this will induce menstrual bleeding.  If she is ok trying this, please send in rx for estradiol 1.0mg  patches (can use [place] so weekly), #4/3RF.  She should use this in April and then take provera 10mg  x 10D at the beginning of May and June.  Will need f/u in July.  I think they are on hold at the moment with fertility treatment.  Thank you.  MSM   ----- Message ----- From: Harrie Jeans, RN Sent: 05/21/2022   4:38 PM EDT To: Jerene Bears, MD  Pt can not remember when her last cycle was. It has been more than 3 months, and she did not bleed with provera. She is willing to proceed with MRI of head. Let me know once it has been ordered and I will precert. Thanks!  Chelsea [name]

## 2022-06-06 NOTE — Telephone Encounter (Signed)
LMOVM for pt to call for recommendations. 

## 2022-06-07 ENCOUNTER — Encounter (HOSPITAL_BASED_OUTPATIENT_CLINIC_OR_DEPARTMENT_OTHER): Payer: Self-pay | Admitting: Obstetrics & Gynecology

## 2022-06-07 ENCOUNTER — Other Ambulatory Visit (HOSPITAL_BASED_OUTPATIENT_CLINIC_OR_DEPARTMENT_OTHER): Payer: Self-pay | Admitting: *Deleted

## 2022-06-07 MED ORDER — ESTRADIOL 0.1 MG/24HR TD PTWK: 0.1000 mg | MEDICATED_PATCH | TRANSDERMAL | 3 refills | Status: DC

## 2022-06-07 MED ORDER — MEDROXYPROGESTERONE ACETATE 10 MG PO TABS: 10.0000 mg | ORAL_TABLET | Freq: Every day | ORAL | 1 refills | Status: DC

## 2022-06-10 DIAGNOSIS — L409 Psoriasis, unspecified: Secondary | ICD-10-CM | POA: Diagnosis not present

## 2022-06-10 DIAGNOSIS — M2550 Pain in unspecified joint: Secondary | ICD-10-CM | POA: Diagnosis not present

## 2022-06-10 DIAGNOSIS — M47819 Spondylosis without myelopathy or radiculopathy, site unspecified: Secondary | ICD-10-CM | POA: Diagnosis not present

## 2022-06-10 DIAGNOSIS — M791 Myalgia, unspecified site: Secondary | ICD-10-CM | POA: Diagnosis not present

## 2022-06-17 DIAGNOSIS — M3500 Sicca syndrome, unspecified: Secondary | ICD-10-CM | POA: Diagnosis not present

## 2022-06-17 DIAGNOSIS — M791 Myalgia, unspecified site: Secondary | ICD-10-CM | POA: Diagnosis not present

## 2022-06-17 DIAGNOSIS — G5603 Carpal tunnel syndrome, bilateral upper limbs: Secondary | ICD-10-CM | POA: Diagnosis not present

## 2022-06-17 DIAGNOSIS — G609 Hereditary and idiopathic neuropathy, unspecified: Secondary | ICD-10-CM | POA: Diagnosis not present

## 2022-06-24 DIAGNOSIS — G5601 Carpal tunnel syndrome, right upper limb: Secondary | ICD-10-CM | POA: Diagnosis not present

## 2022-07-02 ENCOUNTER — Ambulatory Visit (INDEPENDENT_AMBULATORY_CARE_PROVIDER_SITE_OTHER): Payer: BC Managed Care – PPO | Admitting: Sports Medicine

## 2022-07-02 ENCOUNTER — Other Ambulatory Visit (INDEPENDENT_AMBULATORY_CARE_PROVIDER_SITE_OTHER): Payer: BC Managed Care – PPO

## 2022-07-02 DIAGNOSIS — G5601 Carpal tunnel syndrome, right upper limb: Secondary | ICD-10-CM

## 2022-07-02 DIAGNOSIS — M5412 Radiculopathy, cervical region: Secondary | ICD-10-CM | POA: Insufficient documentation

## 2022-07-02 DIAGNOSIS — M170 Bilateral primary osteoarthritis of knee: Secondary | ICD-10-CM

## 2022-07-02 NOTE — Assessment & Plan Note (Signed)
EMG and nerve conduction study confirmed right carpal tunnel syndrome, adding nighttime splinting and home physical therapy. Return in 6 weeks, will consider median nerve hydrodissections if not better.

## 2022-07-02 NOTE — Assessment & Plan Note (Signed)
EMG and nerve conduction study confirmed left C7 radiculopathy, adding home physical therapy, return to see me in 6 weeks, MR for interventional planning if no better.

## 2022-07-02 NOTE — Assessment & Plan Note (Signed)
Increasing pain, repeat bilateral knee injections, last in September 2023.

## 2022-07-02 NOTE — Progress Notes (Signed)
    Procedures performed today:    Procedure: Real-time Ultrasound Guided injection of the left knee Device: Samsung HS60  Verbal informed consent obtained.  Time-out conducted.  Noted no overlying erythema, induration, or other signs of local infection.  Skin prepped in a sterile fashion.  Local anesthesia: Topical Ethyl chloride.  With sterile technique and under real time ultrasound guidance: No noted effusion, 1 cc Kenalog 40, 2 cc lidocaine, 2 cc bupivacaine injected easily Completed without difficulty  Advised to call if fevers/chills, erythema, induration, drainage, or persistent bleeding.  Images permanently stored and available for review in PACS.  Impression: Technically successful ultrasound guided injection.  Procedure: Real-time Ultrasound Guided injection of the right knee Device: Samsung HS60  Verbal informed consent obtained.  Time-out conducted.  Noted no overlying erythema, induration, or other signs of local infection.  Skin prepped in a sterile fashion.  Local anesthesia: Topical Ethyl chloride.  With sterile technique and under real time ultrasound guidance: No effusion noted, 1 cc Kenalog 40, 2 cc lidocaine, 2 cc bupivacaine injected easily Completed without difficulty  Advised to call if fevers/chills, erythema, induration, drainage, or persistent bleeding.  Images permanently stored and available for review in PACS.  Impression: Technically successful ultrasound guided injection.  Independent interpretation of notes and tests performed by another provider:   None.  Brief History, Exam, Impression, and Recommendations:    Primary osteoarthritis of both knees Increasing pain, repeat bilateral knee injections, last in September 2023.  Right carpal tunnel syndrome EMG and nerve conduction study confirmed right carpal tunnel syndrome, adding nighttime splinting and home physical therapy. Return in 6 weeks, will consider median nerve hydrodissections if not  better.  Left cervical radiculopathy EMG and nerve conduction study confirmed left C7 radiculopathy, adding home physical therapy, return to see me in 6 weeks, MR for interventional planning if no better.    ____________________________________________ [name]. Benjamin Stain, M.D., ABFM., CAQSM., AME. Primary Care and Sports Medicine [facility]  Adjunct Professor of Family Medicine  [place] [employer] [facility] [employer]  Restaurant manager, fast food

## 2022-07-13 ENCOUNTER — Telehealth: Payer: BC Managed Care – PPO | Admitting: Nurse Practitioner

## 2022-07-13 ENCOUNTER — Other Ambulatory Visit: Payer: Self-pay | Admitting: Sports Medicine

## 2022-07-13 DIAGNOSIS — N3000 Acute cystitis without hematuria: Secondary | ICD-10-CM

## 2022-07-13 DIAGNOSIS — I10 Essential (primary) hypertension: Secondary | ICD-10-CM

## 2022-07-13 MED ORDER — NITROFURANTOIN MONOHYD MACRO 100 MG PO CAPS: 100.0000 mg | ORAL_CAPSULE | Freq: Two times a day (BID) | ORAL | 0 refills | Status: DC

## 2022-07-13 NOTE — Progress Notes (Signed)

## 2022-07-18 ENCOUNTER — Encounter (INDEPENDENT_AMBULATORY_CARE_PROVIDER_SITE_OTHER): Payer: BC Managed Care – PPO | Admitting: Sports Medicine

## 2022-07-18 DIAGNOSIS — I10 Essential (primary) hypertension: Secondary | ICD-10-CM | POA: Diagnosis not present

## 2022-07-18 DIAGNOSIS — M7918 Myalgia, other site: Secondary | ICD-10-CM

## 2022-07-18 MED ORDER — TOPIRAMATE 100 MG PO TABS: 100.0000 mg | ORAL_TABLET | Freq: Two times a day (BID) | ORAL | 3 refills | Status: DC

## 2022-07-18 MED ORDER — VALSARTAN 160 MG PO TABS: 160.0000 mg | ORAL_TABLET | Freq: Every day | ORAL | 3 refills | Status: DC

## 2022-07-18 MED ORDER — FLUOXETINE HCL 20 MG PO CAPS: 20.0000 mg | ORAL_CAPSULE | Freq: Every day | ORAL | 3 refills | Status: DC

## 2022-07-18 NOTE — Telephone Encounter (Signed)
I spent 5 total minutes of online digital evaluation and management services in this patient-initiated request for online care. 

## 2022-08-02 ENCOUNTER — Encounter: Payer: Self-pay | Admitting: Sports Medicine

## 2022-08-13 ENCOUNTER — Ambulatory Visit (INDEPENDENT_AMBULATORY_CARE_PROVIDER_SITE_OTHER): Payer: BC Managed Care – PPO

## 2022-08-13 ENCOUNTER — Encounter (HOSPITAL_BASED_OUTPATIENT_CLINIC_OR_DEPARTMENT_OTHER): Payer: Self-pay | Admitting: Obstetrics & Gynecology

## 2022-08-13 ENCOUNTER — Ambulatory Visit: Payer: BC Managed Care – PPO | Admitting: Sports Medicine

## 2022-08-13 DIAGNOSIS — M5136 Other intervertebral disc degeneration, lumbar region: Secondary | ICD-10-CM

## 2022-08-13 DIAGNOSIS — M545 Low back pain, unspecified: Secondary | ICD-10-CM | POA: Diagnosis not present

## 2022-08-13 DIAGNOSIS — M51369 Other intervertebral disc degeneration, lumbar region without mention of lumbar back pain or lower extremity pain: Secondary | ICD-10-CM

## 2022-08-13 MED ORDER — PREDNISONE 50 MG PO TABS: ORAL_TABLET | ORAL | 0 refills | Status: DC

## 2022-08-13 NOTE — Progress Notes (Signed)
    Procedures performed today:    None.  Independent interpretation of notes and tests performed by another provider:   Abdominal and pelvic CT from a year ago personally reviewed, there is multilevel degenerative disc disease worst at L3-L4 with a very large disc protrusion causing moderate to severe central canal stenosis, there is a lesser disc protrusion with loss of height at the L5-S1 level.  Brief History, Exam, Impression, and Recommendations:    Lumbar degenerative disc disease Increasing low back pain for the past couple weeks, worse with sitting, flexion, Valsalva, I did review of CT of the abdomen and pelvis from a year ago, she does have the shadow of a very large disc herniation L3-L4. No red flag symptoms, nothing radicular, mostly axial pain. Discussed the natural history of this, will do prednisone, home PT, I emphasized the importance of core conditioning for disc disease. Continue Flexeril 3 times daily, if not better in 4 to 6 weeks we will consider MRI for epidural planning.    ____________________________________________ [name]. Benjamin Stain, M.D., ABFM., CAQSM., AME. Primary Care and Sports Medicine [facility]  Adjunct Professor of Family Medicine  [place] [employer] [facility]  Restaurant manager, fast food

## 2022-08-13 NOTE — Assessment & Plan Note (Signed)
Increasing low back pain for the past couple weeks, worse with sitting, flexion, Valsalva, I did review of CT of the abdomen and pelvis from a year ago, she does have the shadow of a very large disc herniation L3-L4. No red flag symptoms, nothing radicular, mostly axial pain. Discussed the natural history of this, will do prednisone, home PT, I emphasized the importance of core conditioning for disc disease. Continue Flexeril 3 times daily, if not better in 4 to 6 weeks we will consider MRI for epidural planning.

## 2022-08-31 ENCOUNTER — Telehealth: Payer: BC Managed Care – PPO | Admitting: Nurse Practitioner

## 2022-08-31 DIAGNOSIS — R399 Unspecified symptoms and signs involving the genitourinary system: Secondary | ICD-10-CM | POA: Diagnosis not present

## 2022-08-31 MED ORDER — NITROFURANTOIN MONOHYD MACRO 100 MG PO CAPS: 100.0000 mg | ORAL_CAPSULE | Freq: Two times a day (BID) | ORAL | 0 refills | Status: DC

## 2022-08-31 NOTE — Progress Notes (Signed)

## 2022-09-03 DIAGNOSIS — L409 Psoriasis, unspecified: Secondary | ICD-10-CM | POA: Diagnosis not present

## 2022-09-03 DIAGNOSIS — M47819 Spondylosis without myelopathy or radiculopathy, site unspecified: Secondary | ICD-10-CM | POA: Diagnosis not present

## 2022-09-03 DIAGNOSIS — M359 Systemic involvement of connective tissue, unspecified: Secondary | ICD-10-CM | POA: Diagnosis not present

## 2022-09-03 DIAGNOSIS — M797 Fibromyalgia: Secondary | ICD-10-CM | POA: Diagnosis not present

## 2022-09-05 ENCOUNTER — Encounter: Payer: Self-pay | Admitting: Sports Medicine

## 2022-09-16 ENCOUNTER — Ambulatory Visit (INDEPENDENT_AMBULATORY_CARE_PROVIDER_SITE_OTHER): Payer: BC Managed Care – PPO | Admitting: Obstetrics & Gynecology

## 2022-09-16 DIAGNOSIS — N911 Secondary amenorrhea: Secondary | ICD-10-CM | POA: Diagnosis not present

## 2022-09-16 DIAGNOSIS — N8501 Benign endometrial hyperplasia: Secondary | ICD-10-CM

## 2022-09-16 NOTE — Progress Notes (Signed)
GYNECOLOGY  VISIT  CC:   amenorrhea  HPI: 37 y.o. G0P0000 Married White or Caucasian female here for follow up of amenorrhea.  Blood work was negative.  Provera challenge was negative.  Pt does not want to be on OCP.  She is still considering trying for pregnancy with last embryo.  Discussed next step is MRI to r/u pituitary lesions.  She is concerned about cost given current medical costs.  However, thinks she will meet max out of pocket this year.  She and I discussed proceeding with this at that point.  For now, given hx of simple hyperplasia noted last year on D&C would recommend she start progesterone therapy.  She is willing to do this as long as will not interfere with possible embryo transfer.     Past Medical History:  Diagnosis Date   Depression    Endometrial polyp    Fibromyalgia    History of recurrent UTIs    Hypertension    Infertility, anovulation    Myofascial muscle pain    OA (osteoarthritis) of knee    BILATERAL   PCOS (polycystic ovarian syndrome)    PONV (postoperative nausea and vomiting)    Seasonal allergic rhinitis     MEDS:   Current Outpatient Medications on File Prior to Visit  Medication Sig Dispense Refill   hydroxychloroquine (PLAQUENIL) 200 MG tablet Take 200 mg by mouth daily.     ALPRAZolam (XANAX) 0.5 MG tablet Take 1 tablet (0.5 mg total) by mouth 3 (three) times daily as needed for anxiety. 20 tablet 0   cyclobenzaprine (FLEXERIL) 10 MG tablet Take 1 tablet (10 mg total) by mouth at bedtime. 90 tablet 3   FLUoxetine (PROZAC) 20 MG capsule Take 1 capsule (20 mg total) by mouth daily. 90 capsule 3   folic acid (FOLVITE) 800 MCG tablet Take 400 mcg by mouth daily.     labetalol (NORMODYNE) 100 MG tablet TAKE 1 TABLET BY MOUTH TWICE A DAY 180 tablet 1   loratadine (CLARITIN) 10 MG tablet Take 10 mg by mouth daily.     topiramate (TOPAMAX) 100 MG tablet Take 1 tablet (100 mg total) by mouth 2 (two) times daily. 180 tablet 3   No current  facility-administered medications on file prior to visit.    ALLERGIES: Enalapril maleate, Erythromycin, Ceclor [cefaclor], Savella [milnacipran], and Sulfa antibiotics  SH:  partnered, non smoker  Review of Systems  Constitutional: Negative.     PHYSICAL EXAMINATION:    Physical Exam Constitutional:      Appearance: Normal appearance.  Neurological:     General: No focal deficit present.     Mental Status: She is alert.  Psychiatric:        Behavior: Behavior normal.     Assessment/Plan: 1. Secondary amenorrhea - will proceed with MRI hopefully later this year.  Pt will call when ready to proceed  2. Simple endometrial hyperplasia - will start norethindrone 10mg  daily.  Will sent to pharmacy.

## 2022-09-17 ENCOUNTER — Ambulatory Visit: Payer: BC Managed Care – PPO | Admitting: Sports Medicine

## 2022-09-17 DIAGNOSIS — M5136 Other intervertebral disc degeneration, lumbar region: Secondary | ICD-10-CM | POA: Diagnosis not present

## 2022-09-17 DIAGNOSIS — M51369 Other intervertebral disc degeneration, lumbar region without mention of lumbar back pain or lower extremity pain: Secondary | ICD-10-CM

## 2022-09-17 MED ORDER — TRAMADOL HCL 50 MG PO TABS: 50.0000 mg | ORAL_TABLET | Freq: Three times a day (TID) | ORAL | 0 refills | Status: DC | PRN

## 2022-09-17 NOTE — Progress Notes (Addendum)
    Procedures performed today:    None.  Independent interpretation of notes and tests performed by another provider:   I personally reviewed Chelsea [name]'s MRI, it had not been read 2 weeks after it was performed, dominant finding is multilevel disc protrusions worst at L3-L4 with a very large disc extrusion resulting in moderate central canal stenosis, there is a lesser disc extrusion at the L4-L5 level.  Brief History, Exam, Impression, and Recommendations:    Lumbar degenerative disc disease Persistent low back pain now for several months this time described as is worse when standing and better with sitting, CT of the abdomen and pelvis from a year ago showed a large disc herniation L3-L4. She at this point has failed home physical therapy, greater than 6 weeks of physician directed conditioning, Flexeril, x-rays unrevealing, we will proceed with MRI for epidural planning, she would like [place] to go and order the epidural or facet injections once I see the MRI results. Adding some tramadol to use in the meantime.  See above: Large disc herniation/extrusion L3-L4, proceeding with right L3-L4 interlaminar epidural #1.  Patient will follow-up with me 1 month after the epidural, we went over this during her wife's annual physical exam.    ____________________________________________ [name]. Benjamin Stain, M.D., ABFM., CAQSM., AME. Primary Care and Sports Medicine [facility]  Adjunct Professor of Family Medicine  [place] [employer] [facility] [employer]  Restaurant manager, fast food

## 2022-09-17 NOTE — Assessment & Plan Note (Addendum)
Persistent low back pain now for several months this time described as is worse when standing and better with sitting, CT of the abdomen and pelvis from a year ago showed a large disc herniation L3-L4. She at this point has failed home physical therapy, greater than 6 weeks of physician directed conditioning, Flexeril, x-rays unrevealing, we will proceed with MRI for epidural planning, she would like [place] to go and order the epidural or facet injections once I see the MRI results. Adding some tramadol to use in the meantime.  See above: Large disc herniation/extrusion L3-L4, proceeding with right L3-L4 interlaminar epidural #1.  Patient will follow-up with me 1 month after the epidural, we went over this during her wife's annual physical exam.

## 2022-09-17 NOTE — Telephone Encounter (Signed)
Questions addressed personally at follow up visit that was completed on 7/22.

## 2022-09-21 ENCOUNTER — Ambulatory Visit (INDEPENDENT_AMBULATORY_CARE_PROVIDER_SITE_OTHER): Payer: BC Managed Care – PPO

## 2022-09-21 ENCOUNTER — Encounter (HOSPITAL_BASED_OUTPATIENT_CLINIC_OR_DEPARTMENT_OTHER): Payer: Self-pay | Admitting: Obstetrics & Gynecology

## 2022-09-21 DIAGNOSIS — M5126 Other intervertebral disc displacement, lumbar region: Secondary | ICD-10-CM | POA: Diagnosis not present

## 2022-09-21 DIAGNOSIS — M48061 Spinal stenosis, lumbar region without neurogenic claudication: Secondary | ICD-10-CM | POA: Diagnosis not present

## 2022-09-21 DIAGNOSIS — M5136 Other intervertebral disc degeneration, lumbar region: Secondary | ICD-10-CM

## 2022-09-21 DIAGNOSIS — M549 Dorsalgia, unspecified: Secondary | ICD-10-CM | POA: Diagnosis not present

## 2022-09-21 MED ORDER — NORETHINDRONE ACETATE 5 MG PO TABS: 10.0000 mg | ORAL_TABLET | Freq: Every day | ORAL | 11 refills | Status: AC

## 2022-09-24 ENCOUNTER — Telehealth (HOSPITAL_BASED_OUTPATIENT_CLINIC_OR_DEPARTMENT_OTHER): Payer: Self-pay | Admitting: *Deleted

## 2022-09-24 NOTE — Telephone Encounter (Signed)
-----   Message from Jerene Bears sent at 09/21/2022 10:30 AM EDT ----- Regarding: progesterone Kim Can you let pt know that I sent in a prescription for aygestin 10mg  daily.  This will help protect the endometrium while waiting to proceed with embryo transfer.  It can be easily stopped prior to transfer per recommendations by Dr. April Manson when that time comes.  She can just keep taking it daily.  Also, please ask her to let me know when can proceed with MRI of head for pituitary evaluation.  Thank you.  Leda Quail

## 2022-09-24 NOTE — Telephone Encounter (Signed)
DOB verified. Informed pt that Dr. Hyacinth Meeker sent in a prescription for aygestin 10mg  daily, which will help protect the endometrium while waiting to proceed with embryo transfer. Advised that it can be easily stopped prior to transfer per recommendations by Dr. April Manson when that time comes. Advised to please let us know when she can proceed with MRI of head for pituitary evaluation. Pt verbalized understanding.

## 2022-09-30 ENCOUNTER — Telehealth: Payer: Self-pay | Admitting: Sports Medicine

## 2022-09-30 DIAGNOSIS — M51369 Other intervertebral disc degeneration, lumbar region without mention of lumbar back pain or lower extremity pain: Secondary | ICD-10-CM

## 2022-09-30 DIAGNOSIS — M5136 Other intervertebral disc degeneration, lumbar region: Secondary | ICD-10-CM

## 2022-09-30 MED ORDER — TRAMADOL HCL 50 MG PO TABS: 50.0000 mg | ORAL_TABLET | Freq: Three times a day (TID) | ORAL | 0 refills | Status: DC | PRN

## 2022-09-30 NOTE — Telephone Encounter (Signed)
Patient called in for MRI results and also medication refill for Tramadol. Please advise.  [facility] [address] [place]

## 2022-09-30 NOTE — Telephone Encounter (Signed)
Tramadol refilled, she will know before me when the results are in but as soon as I see the official results I will also put my own interpretation.

## 2022-10-01 NOTE — Addendum Note (Signed)
Addended by: Monica Becton on: 10/01/2022 03:04 PM   Modules accepted: Orders

## 2022-10-02 ENCOUNTER — Other Ambulatory Visit: Payer: Self-pay | Admitting: Sports Medicine

## 2022-10-02 DIAGNOSIS — I10 Essential (primary) hypertension: Secondary | ICD-10-CM

## 2022-10-14 ENCOUNTER — Ambulatory Visit
Admission: RE | Admit: 2022-10-14 | Discharge: 2022-10-14 | Disposition: A | Discharge status: Home / Self Care | Payer: BC Managed Care – PPO | Source: Ambulatory Visit | Attending: Sports Medicine | Admitting: Sports Medicine

## 2022-10-14 ENCOUNTER — Telehealth: Payer: BC Managed Care – PPO | Admitting: Physician Assistant

## 2022-10-14 DIAGNOSIS — M47897 Other spondylosis, lumbosacral region: Secondary | ICD-10-CM | POA: Diagnosis not present

## 2022-10-14 DIAGNOSIS — M5126 Other intervertebral disc displacement, lumbar region: Secondary | ICD-10-CM | POA: Diagnosis not present

## 2022-10-14 DIAGNOSIS — R3989 Other symptoms and signs involving the genitourinary system: Secondary | ICD-10-CM

## 2022-10-14 DIAGNOSIS — M5136 Other intervertebral disc degeneration, lumbar region: Secondary | ICD-10-CM

## 2022-10-14 MED ORDER — NITROFURANTOIN MONOHYD MACRO 100 MG PO CAPS: 100.0000 mg | ORAL_CAPSULE | Freq: Two times a day (BID) | ORAL | 0 refills | Status: DC

## 2022-10-14 MED ORDER — METHYLPREDNISOLONE ACETATE 40 MG/ML INJ SUSP (RADIOLOG
80.0000 mg | Freq: Once | INTRAMUSCULAR | Status: AC
  Administered 2022-10-14: 80 mg via EPIDURAL

## 2022-10-14 MED ORDER — IOPAMIDOL (ISOVUE-M 200) INJECTION 41%
1.0000 mL | Freq: Once | INTRAMUSCULAR | Status: AC
  Administered 2022-10-14: 1 mL via EPIDURAL

## 2022-10-14 NOTE — Progress Notes (Signed)
E-Visit for Urinary Problems  We are sorry that you are not feeling well.  Here is how we plan to help!  Based on what you shared with me it looks like you most likely have a simple urinary tract infection.  A UTI (Urinary Tract Infection) is a bacterial infection of the bladder.  Most cases of urinary tract infections are simple to treat but a key part of your care is to encourage you to drink plenty of fluids and watch your symptoms carefully.  I have prescribed MacroBid 100 mg twice a day for 5 days.  Your symptoms should gradually improve. Call us if the burning in your urine worsens, you develop worsening fever, back pain or pelvic pain or if your symptoms do not resolve after completing the antibiotic.  Urinary tract infections can be prevented by drinking plenty of water to keep your body hydrated.  Also be sure when you wipe, wipe from front to back and don't hold it in!  If possible, empty your bladder every 4 hours.  HOME CARE Drink plenty of fluids Compete the full course of the antibiotics even if the symptoms resolve Remember, when you need to go.go. Holding in your urine can increase the likelihood of getting a UTI! GET HELP RIGHT AWAY IF: You cannot urinate You get a high fever Worsening back pain occurs You see blood in your urine You feel sick to your stomach or throw up You feel like you are going to pass out  MAKE SURE YOU  Understand these instructions. Will watch your condition. Will get help right away if you are not doing well or get worse.   Thank you for choosing an e-visit.  Your e-visit answers were reviewed by a board certified advanced clinical practitioner to complete your personal care plan. Depending upon the condition, your plan could have included both over the counter or prescription medications.  Please review your pharmacy choice. Make sure the pharmacy is open so you can pick up prescription now. If there is a problem, you may contact your  provider through MyChart messaging and have the prescription routed to another pharmacy.  Your safety is important to us. If you have drug allergies check your prescription carefully.   For the next 24 hours you can use MyChart to ask questions about today's visit, request a non-urgent call back, or ask for a work or school excuse. You will get an email in the next two days asking about your experience. I hope that your e-visit has been valuable and will speed your recovery.  I have spent 5 minutes in review of e-visit questionnaire, review and updating patient chart, medical decision making and response to patient.   Jennifer M Burnette, PA-C  

## 2022-10-14 NOTE — Discharge Instructions (Signed)

## 2022-11-04 ENCOUNTER — Telehealth: Payer: BC Managed Care – PPO | Admitting: Nurse Practitioner

## 2022-11-04 ENCOUNTER — Encounter (HOSPITAL_BASED_OUTPATIENT_CLINIC_OR_DEPARTMENT_OTHER): Payer: Self-pay | Admitting: Obstetrics & Gynecology

## 2022-11-04 ENCOUNTER — Ambulatory Visit (HOSPITAL_BASED_OUTPATIENT_CLINIC_OR_DEPARTMENT_OTHER): Payer: BC Managed Care – PPO

## 2022-11-04 VITALS — BP 165/111 | HR 74 | Ht 67.0 in | Wt 277.8 lb

## 2022-11-04 DIAGNOSIS — R3989 Other symptoms and signs involving the genitourinary system: Secondary | ICD-10-CM

## 2022-11-04 DIAGNOSIS — R300 Dysuria: Secondary | ICD-10-CM

## 2022-11-04 LAB — POCT URINALYSIS DIPSTICK
Blood, UA: NEGATIVE
Glucose, UA: NEGATIVE
Ketones, UA: NEGATIVE
Nitrite, UA: NEGATIVE
Protein, UA: POSITIVE — AB
Spec Grav, UA: >=1.03 — AB (ref 1.010–1.025)
Urobilinogen, UA: 0.2 U/dL
pH, UA: 6 (ref 5.0–8.0)

## 2022-11-04 MED ORDER — NITROFURANTOIN MONOHYD MACRO 100 MG PO CAPS: 100.0000 mg | ORAL_CAPSULE | Freq: Two times a day (BID) | ORAL | 0 refills | Status: AC

## 2022-11-04 NOTE — Progress Notes (Signed)
Chelsea [name],  Since you have had a UTI in the past month we recommend you are seen in person for up to date urine testing and culture.   Based on your recent antibitoic use and your most recently culture and allergies, there are limited options for treatment, a urine culture will assure you get the best medicine  I feel your condition warrants further evaluation and I recommend that you be seen in a face to face visit.   NOTE: There will be NO CHARGE for this eVisit   If you are having a true medical emergency please call 911.      For an urgent face to face visit, [place] has eight urgent care centers for your convenience:   NEW!! [facility] Health Urgent Care Center at [facility] Get Driving Directions [phone] [address] [place], [address]    [facility] Health Urgent Care Center at [facility] Get Driving Directions [phone] [address] [place], [place] [address]   [facility] Health Urgent Care Center [facility]) Get Driving Directions [phone] [address] [place], [place] [address]  [facility] Health Urgent Care Center [facility]) Get Driving Directions [phone] [address] [place],  [place]  [address]  [facility] Health Urgent Care Center [facility] - at [employer]  [phone] [address] [place],  [place] [address]   [facility] Health Urgent Care at [facility] Get Driving Directions [phone] [address] [place], [place] [address]   [facility] Health Urgent Care at [facility] Get Driving Directions  [phone] [address] [place], [place] [address]   [facility] Health Urgent Care at [facility] Directions [phone] [address], [address] [place], [place] [address]  Your MyChart E-visit questionnaire answers were reviewed by a board certified advanced clinical practitioner to complete your personal care  plan based on your specific symptoms.  Thank you for using e-Visits.

## 2022-11-04 NOTE — Progress Notes (Signed)
Pt presents to office with complaints of burning sensation, frequent urination and back pain. Pt instructed on and collected clean catch urine. Urine sent for culture. Rx sent to pharmacy for treatment of symptoms. Provider notified of elevated blood pressure. Advised pt to check blood pressure at home and notify PCP of elevated reading. Pt denies blood pressure symptoms and advised when she should seek care.

## 2022-11-06 LAB — URINE CULTURE

## 2022-11-23 ENCOUNTER — Telehealth: Payer: BC Managed Care – PPO | Admitting: Family Medicine

## 2022-11-23 DIAGNOSIS — N390 Urinary tract infection, site not specified: Secondary | ICD-10-CM | POA: Diagnosis not present

## 2022-11-23 MED ORDER — NITROFURANTOIN MONOHYD MACRO 100 MG PO CAPS: 100.0000 mg | ORAL_CAPSULE | Freq: Two times a day (BID) | ORAL | 0 refills | Status: AC

## 2022-11-23 NOTE — Progress Notes (Signed)
E-Visit for Urinary Problems  We are sorry that you are not feeling well.  Here is how we plan to help!  Based on what you shared with me it looks like you most likely have a simple urinary tract infection.  A UTI (Urinary Tract Infection) is a bacterial infection of the bladder.  Most cases of urinary tract infections are simple to treat but a key part of your care is to encourage you to drink plenty of fluids and watch your symptoms carefully.  I have prescribed MacroBid 100 mg twice a day for 5 days.  Your symptoms should gradually improve. Call us if the burning in your urine worsens, you develop worsening fever, back pain or pelvic pain or if your symptoms do not resolve after completing the antibiotic.  Urinary tract infections can be prevented by drinking plenty of water to keep your body hydrated.  Also be sure when you wipe, wipe from front to back and don't hold it in!  If possible, empty your bladder every 4 hours.  HOME CARE Drink plenty of fluids Compete the full course of the antibiotics even if the symptoms resolve Remember, when you need to go.go. Holding in your urine can increase the likelihood of getting a UTI! GET HELP RIGHT AWAY IF: You cannot urinate You get a high fever Worsening back pain occurs You see blood in your urine You feel sick to your stomach or throw up You feel like you are going to pass out  MAKE SURE YOU  Understand these instructions. Will watch your condition. Will get help right away if you are not doing well or get worse.   Thank you for choosing an e-visit.  Your e-visit answers were reviewed by a board certified advanced clinical practitioner to complete your personal care plan. Depending upon the condition, your plan could have included both over the counter or prescription medications.  Please review your pharmacy choice. Make sure the pharmacy is open so you can pick up prescription now. If there is a problem, you may contact your  provider through MyChart messaging and have the prescription routed to another pharmacy.  Your safety is important to us. If you have drug allergies check your prescription carefully.   For the next 24 hours you can use MyChart to ask questions about today's visit, request a non-urgent call back, or ask for a work or school excuse. You will get an email in the next two days asking about your experience. I hope that your e-visit has been valuable and will speed your recovery.    have provided 5 minutes of non face to face time during this encounter for chart review and documentation.   

## 2022-11-29 ENCOUNTER — Encounter: Payer: Self-pay | Admitting: Sports Medicine

## 2022-11-29 ENCOUNTER — Ambulatory Visit: Payer: BC Managed Care – PPO | Admitting: Sports Medicine

## 2022-11-29 DIAGNOSIS — M5136 Other intervertebral disc degeneration, lumbar region with discogenic back pain only: Secondary | ICD-10-CM | POA: Diagnosis not present

## 2022-11-29 DIAGNOSIS — M51369 Other intervertebral disc degeneration, lumbar region without mention of lumbar back pain or lower extremity pain: Secondary | ICD-10-CM | POA: Diagnosis not present

## 2022-11-29 MED ORDER — TRAMADOL HCL 50 MG PO TABS: 50.0000 mg | ORAL_TABLET | Freq: Three times a day (TID) | ORAL | 0 refills | Status: DC | PRN

## 2022-11-29 NOTE — Assessment & Plan Note (Signed)
37 year old obese female returns, chronic axial low back pain, she did have a large disc extrusion L3-L4, after failure of conservative treatment including physician directed physical therapy we proceeded with a right L3-L4 interlaminar epidural, she really did not get much relief, I would like her to touch base with Dr. Franky Macho downstairs. Refilling tramadol.

## 2022-11-29 NOTE — Progress Notes (Signed)
    Procedures performed today:    None.  Independent interpretation of notes and tests performed by another provider:   None.  Brief History, Exam, Impression, and Recommendations:    Lumbar degenerative disc disease 37 year old obese female returns, chronic axial low back pain, she did have a large disc extrusion L3-L4, after failure of conservative treatment including physician directed physical therapy we proceeded with a right L3-L4 interlaminar epidural, she really did not get much relief, I would like her to touch base with Dr. Franky Macho downstairs. Refilling tramadol.  Chronic process with exacerbation and pharmacologic intervention  ____________________________________________ [name]. Benjamin Stain, M.D., ABFM., CAQSM., AME. Primary Care and Sports Medicine [facility]  Adjunct Professor of Family Medicine  [place] [employer]  Restaurant manager, fast food

## 2022-12-03 ENCOUNTER — Encounter (HOSPITAL_BASED_OUTPATIENT_CLINIC_OR_DEPARTMENT_OTHER): Payer: Self-pay | Admitting: Obstetrics & Gynecology

## 2022-12-04 ENCOUNTER — Encounter (HOSPITAL_BASED_OUTPATIENT_CLINIC_OR_DEPARTMENT_OTHER): Payer: Self-pay

## 2022-12-04 ENCOUNTER — Ambulatory Visit (HOSPITAL_BASED_OUTPATIENT_CLINIC_OR_DEPARTMENT_OTHER): Payer: BC Managed Care – PPO

## 2022-12-05 ENCOUNTER — Encounter (HOSPITAL_BASED_OUTPATIENT_CLINIC_OR_DEPARTMENT_OTHER): Payer: Self-pay | Admitting: Obstetrics & Gynecology

## 2022-12-09 DIAGNOSIS — M5126 Other intervertebral disc displacement, lumbar region: Secondary | ICD-10-CM | POA: Diagnosis not present

## 2022-12-10 ENCOUNTER — Other Ambulatory Visit (HOSPITAL_BASED_OUTPATIENT_CLINIC_OR_DEPARTMENT_OTHER): Payer: Self-pay | Admitting: Obstetrics & Gynecology

## 2023-01-01 DIAGNOSIS — M5126 Other intervertebral disc displacement, lumbar region: Secondary | ICD-10-CM | POA: Diagnosis not present

## 2023-01-15 ENCOUNTER — Telehealth: Payer: BC Managed Care – PPO | Admitting: Physician Assistant

## 2023-01-15 DIAGNOSIS — R3989 Other symptoms and signs involving the genitourinary system: Secondary | ICD-10-CM

## 2023-01-15 MED ORDER — NITROFURANTOIN MONOHYD MACRO 100 MG PO CAPS
100.0000 mg | ORAL_CAPSULE | Freq: Two times a day (BID) | ORAL | 0 refills | Status: DC
Start: 2023-01-15 — End: 2023-02-24

## 2023-01-15 NOTE — Progress Notes (Signed)
E-Visit for Urinary Problems ? ?We are sorry that you are not feeling well.  Here is how we plan to help! ? ?Based on what you shared with me it looks like you most likely have a simple urinary tract infection. ? ?A UTI (Urinary Tract Infection) is a bacterial infection of the bladder. ? ?Most cases of urinary tract infections are simple to treat but a key part of your care is to encourage you to drink plenty of fluids and watch your symptoms carefully. ? ?I have prescribed MacroBid 100 mg twice a day for 5 days.  Your symptoms should gradually improve. Call us if the burning in your urine worsens, you develop worsening fever, back pain or pelvic pain or if your symptoms do not resolve after completing the antibiotic. ? ?Urinary tract infections can be prevented by drinking plenty of water to keep your body hydrated.  Also be sure when you wipe, wipe from front to back and don't hold it in!  If possible, empty your bladder every 4 hours. ? ?HOME CARE ?Drink plenty of fluids ?Compete the full course of the antibiotics even if the symptoms resolve ?Remember, when you need to go?go. Holding in your urine can increase the likelihood of getting a UTI! ?GET HELP RIGHT AWAY IF: ?You cannot urinate ?You get a high fever ?Worsening back pain occurs ?You see blood in your urine ?You feel sick to your stomach or throw up ?You feel like you are going to pass out ? ?MAKE SURE YOU  ?Understand these instructions. ?Will watch your condition. ?Will get help right away if you are not doing well or get worse. ? ? ?Thank you for choosing an e-visit. ? ?Your e-visit answers were reviewed by a board certified advanced clinical practitioner to complete your personal care plan. Depending upon the condition, your plan could have included both over the counter or prescription medications. ? ?Please review your pharmacy choice. Make sure the pharmacy is open so you can pick up prescription now. If there is a problem, you may contact your  provider through MyChart messaging and have the prescription routed to another pharmacy.  Your safety is important to us. If you have drug allergies check your prescription carefully.  ? ?For the next 24 hours you can use MyChart to ask questions about today's visit, request a non-urgent call back, or ask for a work or school excuse. ?You will get an email in the next two days asking about your experience. I hope that your e-visit has been valuable and will speed your recovery.  ?

## 2023-01-15 NOTE — Progress Notes (Signed)
I have spent 5 minutes in review of e-visit questionnaire, review and updating patient chart, medical decision making and response to patient.   Mia Milan Cody Jacklynn Dehaas, PA-C    

## 2023-02-11 DIAGNOSIS — E282 Polycystic ovarian syndrome: Secondary | ICD-10-CM | POA: Diagnosis not present

## 2023-02-11 DIAGNOSIS — Z3181 Encounter for male factor infertility in female patient: Secondary | ICD-10-CM | POA: Diagnosis not present

## 2023-02-11 DIAGNOSIS — Z319 Encounter for procreative management, unspecified: Secondary | ICD-10-CM | POA: Diagnosis not present

## 2023-02-12 DIAGNOSIS — Z3141 Encounter for fertility testing: Secondary | ICD-10-CM | POA: Diagnosis not present

## 2023-02-12 DIAGNOSIS — Z3200 Encounter for pregnancy test, result unknown: Secondary | ICD-10-CM | POA: Diagnosis not present

## 2023-02-12 DIAGNOSIS — N856 Intrauterine synechiae: Secondary | ICD-10-CM | POA: Diagnosis not present

## 2023-02-12 DIAGNOSIS — N711 Chronic inflammatory disease of uterus: Secondary | ICD-10-CM | POA: Diagnosis not present

## 2023-02-24 ENCOUNTER — Ambulatory Visit
Admission: EM | Admit: 2023-02-24 | Discharge: 2023-02-24 | Disposition: A | Discharge status: Home / Self Care | Payer: BC Managed Care – PPO | Attending: Family Medicine | Admitting: Family Medicine

## 2023-02-24 ENCOUNTER — Other Ambulatory Visit: Payer: Self-pay

## 2023-02-24 DIAGNOSIS — J069 Acute upper respiratory infection, unspecified: Secondary | ICD-10-CM

## 2023-02-24 LAB — POC SARS CORONAVIRUS 2 AG -  ED: SARS Coronavirus 2 Ag: NEGATIVE

## 2023-02-24 LAB — POCT INFLUENZA A/B
Influenza A, POC: NEGATIVE
Influenza B, POC: NEGATIVE

## 2023-02-24 NOTE — Discharge Instructions (Signed)
Continue over-the-counter medicines for your cough and cold symptoms See your doctor if not improving by the end of the week

## 2023-02-24 NOTE — ED Triage Notes (Signed)
Cough, headache, fever, nasal congestion since 25th. Has taken nyquil, advil, tylenol.

## 2023-02-25 NOTE — ED Provider Notes (Signed)
[facility]    CSN: [id number] Arrival date & time: 02/24/23  1755      History   Chief Complaint No chief complaint on file.   HPI Chelsea [name] is a 37 y.o. female.   HPI  Patient and wife are here for respiratory illness.  Cough headache fever nasal congestion since the 25th.  They were around family over the holiday that have viral pneumonia.  COVID and flu negative by history.  No fever or chills.  Body aches and fatigue  Past Medical History:  Diagnosis Date   Depression    Endometrial polyp    Fibromyalgia    History of recurrent UTIs    Hypertension    Infertility, anovulation    Myofascial muscle pain    OA (osteoarthritis) of knee    BILATERAL   PCOS (polycystic ovarian syndrome)    PONV (postoperative nausea and vomiting)    Seasonal allergic rhinitis     Patient Active Problem List   Diagnosis Date Noted   Left cervical radiculopathy 07/02/2022   Right carpal tunnel syndrome 04/01/2022   Left hand tendonitis 02/19/2022   Simple endometrial hyperplasia    Right ankle sprain 07/19/2020   Chronic pain syndrome 05/30/2020   Impingement syndrome of both shoulders 08/17/2019   Primary insomnia 05/11/2019   Mixed hypercholesterolemia and hypertriglyceridemia 11/13/2018   Orgasmic headache 11/11/2018   Diet-controlled diabetes mellitus (HCC) 04/25/2017   Lumbar degenerative disc disease 08/24/2015   Intractable migraine without aura and with status migrainosus 06/24/2014   Myofascial pain syndrome with depression 02/08/2014   Venous stasis dermatitis 01/25/2014   Right hip pain 12/13/2013   Metatarsalgia of both feet 10/18/2013   Primary osteoarthritis of both knees 02/13/2012   Essential hypertension, benign 11/25/2008   POLYCYSTIC OVARIAN DISEASE 10/26/2008   History of gastric sleeve 10/26/2008    Past Surgical History:  Procedure Laterality Date   CHOLECYSTECTOMY N/A 07/11/2021   Procedure: LAPAROSCOPIC CHOLECYSTECTOMY WITH  INTRAOPERATIVE CHOLANGIOGRAM;  Surgeon: Manus Rudd, MD;  Location: [facility];  Service: General;  Laterality: N/A;   HYSTEROSCOPY WITH D & C N/A 09/07/2014   Procedure:  DILATATION AND CURETTAGE /HYSTEROSCOPY/POLYPECTOMY;  Surgeon: Fermin Schwab, MD;  Location: [facility];  Service: Gynecology;  Laterality: N/A;   HYSTEROSCOPY WITH D & C N/A 07/25/2021   Procedure: DILATATION AND CURETTAGE /HYSTEROSCOPY;  Surgeon: Jerene Bears, MD;  Location: [facility] [place];  Service: Gynecology;  Laterality: N/A;   I & D RIGHT FOOT  1998   LAPAROSCOPIC GASTRIC SLEEVE RESECTION N/A 10/20/2017   Procedure: LAPAROSCOPIC GASTRIC SLEEVE RESECTION, UPPER ENDO, ERAS Pathway;  Surgeon: Berna Bue, MD;  Location: [facility];  Service: General;  Laterality: N/A;   POLYPECTOMY     uterine   TONSILLECTOMY  2001   WISDOM TOOTH EXTRACTION  2004    OB History     Gravida  0   Para  0   Term  0   Preterm  0   AB  0   Living  0      SAB  0   IAB  0   Ectopic  0   Multiple  0   Live Births               Home Medications    Prior to Admission medications   Medication Sig Start Date End Date Taking? Authorizing Provider  ALPRAZolam Prudy Feeler) 0.5 MG tablet Take 1 tablet (0.5 mg total) by mouth  3 (three) times daily as needed for anxiety. 12/09/18   Monica Becton, MD  cyclobenzaprine (FLEXERIL) 10 MG tablet Take 1 tablet (10 mg total) by mouth at bedtime. 04/01/22   Monica Becton, MD  FLUoxetine (PROZAC) 20 MG capsule Take 1 capsule (20 mg total) by mouth daily. 07/18/22   Monica Becton, MD  labetalol (NORMODYNE) 100 MG tablet TAKE 1 TABLET BY MOUTH TWICE A DAY 10/03/22   Monica Becton, MD  loratadine (CLARITIN) 10 MG tablet Take 10 mg by mouth daily.    [provider]  norethindrone (AYGESTIN) 5 MG tablet Take 2 tablets (10 mg total) by mouth daily. 09/21/22   Jerene Bears, MD  topiramate (TOPAMAX) 100 MG tablet Take 1  tablet (100 mg total) by mouth 2 (two) times daily. 07/18/22   Monica Becton, MD    Family History Family History  Problem Relation Age of Onset   Diabetes Mother    Polycystic ovary syndrome Mother    Hypertension Father    Diabetes Father    Fibromyalgia Maternal Aunt    Fibromyalgia Cousin    Cancer Paternal Grandfather        prostate   Glaucoma Paternal Grandmother     Social History Social History   Tobacco Use   Smoking status: Never   Smokeless tobacco: Never  Vaping Use   Vaping status: Never Used  Substance Use Topics   Alcohol use: No    Alcohol/week: 0.0 standard drinks of alcohol   Drug use: No     Allergies   Enalapril maleate, Erythromycin, Ceclor [cefaclor], Savella [milnacipran], Plaquenil [hydroxychloroquine], and Sulfa antibiotics   Review of Systems Review of Systems See HPI  Physical Exam Triage Vital Signs ED Triage Vitals  Encounter Vitals Group     BP 02/24/23 1849 (!) 139/104     Systolic BP Percentile --      Diastolic BP Percentile --      Pulse Rate 02/24/23 1849 84     Resp 02/24/23 1849 16     Temp 02/24/23 1849 98.5 F (36.9 C)     Temp Source 02/24/23 1849 Oral     SpO2 02/24/23 1849 97 %     Weight --      Height --      Head Circumference --      Peak Flow --      Pain Score 02/24/23 1854 7     Pain Loc --      Pain Education --      Exclude from Growth Chart --    No data found.  Updated Vital Signs BP (!) 139/104   Pulse 84   Temp 98.5 F (36.9 C) (Oral)   Resp 16   SpO2 97%      Physical Exam Constitutional:      General: She is not in acute distress.    Appearance: She is well-developed.  HENT:     Head: Normocephalic and atraumatic.     Right Ear: Tympanic membrane normal.     Left Ear: Tympanic membrane normal.     Nose: Congestion and rhinorrhea present.     Mouth/Throat:     Pharynx: No posterior oropharyngeal erythema.  Eyes:     Conjunctiva/sclera: Conjunctivae normal.     Pupils:  Pupils are equal, round, and reactive to light.  Cardiovascular:     Rate and Rhythm: Normal rate and regular rhythm.     Heart sounds: Normal heart  sounds.  Pulmonary:     Effort: Pulmonary effort is normal. No respiratory distress.     Breath sounds: Normal breath sounds.  Abdominal:     General: There is no distension.     Palpations: Abdomen is soft.  Musculoskeletal:        General: Normal range of motion.     Cervical back: Normal range of motion.  Skin:    General: Skin is warm and dry.  Neurological:     Mental Status: She is alert.      UC Treatments / Results  Labs (all labs ordered are listed, but only abnormal results are displayed) Labs Reviewed  POC SARS CORONAVIRUS 2 AG -  ED  POCT INFLUENZA A/B    EKG   Radiology No results found.  Procedures Procedures (including critical care time)  Medications Ordered in UC Medications - No data to display  Initial Impression / Assessment and Plan / UC Course  I have reviewed the triage vital signs and the nursing notes.  Pertinent labs & imaging results that were available during my care of the patient were reviewed by me and considered in my medical decision making (see chart for details).     Flu and COVID are negative.  Symptomatic care advised Final Clinical Impressions(s) / UC Diagnoses   Final diagnoses:  Viral upper respiratory tract infection with cough     Discharge Instructions      Continue over-the-counter medicines for your cough and cold symptoms See your doctor if not improving by the end of the week   ED Prescriptions   None    PDMP not reviewed this encounter.   Eustace Moore, MD 02/25/23 [phone]

## 2023-03-04 ENCOUNTER — Encounter: Payer: Self-pay | Admitting: Sports Medicine

## 2023-03-06 ENCOUNTER — Ambulatory Visit (INDEPENDENT_AMBULATORY_CARE_PROVIDER_SITE_OTHER): Payer: BC Managed Care – PPO | Admitting: Sports Medicine

## 2023-03-06 VITALS — BP 161/79 | HR 94 | Ht 67.0 in | Wt 267.0 lb

## 2023-03-06 DIAGNOSIS — E119 Type 2 diabetes mellitus without complications: Secondary | ICD-10-CM | POA: Diagnosis not present

## 2023-03-06 DIAGNOSIS — E782 Mixed hyperlipidemia: Secondary | ICD-10-CM

## 2023-03-06 DIAGNOSIS — I10 Essential (primary) hypertension: Secondary | ICD-10-CM

## 2023-03-06 NOTE — Progress Notes (Addendum)
    Procedures performed today:    None.  Independent interpretation of notes and tests performed by another provider:   None.  Brief History, Exam, Impression, and Recommendations:    Essential hypertension, benign Elevated blood pressure, currently on 50 mg of labetalol  twice daily, she is try to get pregnant so we have had to avoid other medications that have controlled her blood pressure well. We will increase to 100 mg twice daily and she will send me a blood pressure log over 2 weeks.  Mixed hypercholesterolemia and hypertriglyceridemia Lipids continue to be markedly elevated, we do need a statin, awaiting patient approval for rosuvastatin 20 mg daily with a 47-month fasting lipid recheck.  Update: Lipids are elevated, she does need Crestor 20 however she is going through IVF, we can hold off until after we know the results of the IVF and whether she got pregnant or not. If she is unable to get pregnant we will start Crestor 20.    ____________________________________________ Debby PARAS. Curtis, M.D., ABFM., CAQSM., AME. Primary Care and Sports Medicine [facility]  Adjunct Professor of [facility] Medicine  [employer]  Restaurant Manager, [employer]

## 2023-03-06 NOTE — Assessment & Plan Note (Signed)
 Elevated blood pressure, currently on 50 mg of labetalol  twice daily, she is try to get pregnant so we have had to avoid other medications that have controlled her blood pressure well. We will increase to 100 mg twice daily and she will send me a blood pressure log over 2 weeks.

## 2023-03-07 NOTE — Assessment & Plan Note (Addendum)
 Lipids continue to be markedly elevated, we do need a statin, awaiting patient approval for rosuvastatin 20 mg daily with a 57-month fasting lipid recheck.  Update: Lipids are elevated, she does need Crestor 20 however she is going through IVF, we can hold off until after we know the results of the IVF and whether she got pregnant or not. If she is unable to get pregnant we will start Crestor 20.

## 2023-03-09 ENCOUNTER — Encounter: Payer: Self-pay | Admitting: Sports Medicine

## 2023-03-09 LAB — COMPREHENSIVE METABOLIC PANEL WITH GFR
ALT: 46 IU/L — ABNORMAL HIGH (ref 0–32)
Albumin: 4.2 g/dL (ref 3.9–4.9)
Alkaline Phosphatase: 89 IU/L (ref 44–121)
BUN/Creatinine Ratio: 11 (ref 9–23)
CO2: 16 mmol/L — ABNORMAL LOW (ref 20–29)
Calcium: 8.9 mg/dL (ref 8.7–10.2)
Creatinine, Ser: 0.89 mg/dL (ref 0.57–1.00)
Globulin, Total: 2.5 g/dL (ref 1.5–4.5)
Glucose: 86 mg/dL (ref 70–99)
Total Protein: 6.7 g/dL (ref 6.0–8.5)
eGFR: 87 mL/min/1.73 (ref >59)

## 2023-03-09 LAB — CBC
Hematocrit: 46.1 % (ref 34.0–46.6)
Hemoglobin: 15.3 g/dL (ref 11.1–15.9)
MCH: 28.7 pg (ref 26.6–33.0)
MCHC: 33.2 g/dL (ref 31.5–35.7)
MCV: 86 fL (ref 79–97)
Platelets: 220 10*3/uL (ref 150–450)
RBC: 5.34 x10E6/uL — ABNORMAL HIGH (ref 3.77–5.28)
RDW: 12.7 % (ref 11.7–15.4)
WBC: 8.5 x10E3/uL (ref 3.4–10.8)

## 2023-03-09 LAB — HEMOGLOBIN A1C
Est. average glucose Bld gHb Est-mCnc: 114 mg/dL
Hgb A1c MFr Bld: 5.6 % (ref 4.8–5.6)

## 2023-03-09 LAB — LIPID PANEL
Chol/HDL Ratio: 8.7 ratio — ABNORMAL HIGH (ref 0.0–4.4)
Cholesterol, Total: 227 mg/dL — ABNORMAL HIGH (ref 100–199)
HDL: 26 mg/dL — ABNORMAL LOW (ref >39)
LDL Chol Calc (NIH): 172 mg/dL — ABNORMAL HIGH (ref 0–99)
Triglycerides: 155 mg/dL — ABNORMAL HIGH (ref 0–149)
VLDL Cholesterol Cal: 29 mg/dL (ref 5–40)

## 2023-03-09 LAB — MICROALBUMIN / CREATININE URINE RATIO
Creatinine, Urine: 269.2 mg/dL
Microalb/Creat Ratio: 8 mg/g{creat} (ref 0–29)
Microalbumin, Urine: 21.2 ug/mL

## 2023-03-09 LAB — COMPREHENSIVE METABOLIC PANEL
AST: 23 [IU]/L (ref 0–40)
BUN: 10 mg/dL (ref 6–20)
Bilirubin Total: 0.5 mg/dL (ref 0.0–1.2)
Chloride: 110 mmol/L — ABNORMAL HIGH (ref 96–106)
Potassium: 4.1 mmol/L (ref 3.5–5.2)
Sodium: 143 mmol/L (ref 134–144)

## 2023-03-09 LAB — TSH: TSH: 2.25 u[IU]/mL (ref 0.450–4.500)

## 2023-03-10 ENCOUNTER — Encounter (HOSPITAL_BASED_OUTPATIENT_CLINIC_OR_DEPARTMENT_OTHER): Payer: Self-pay | Admitting: Obstetrics & Gynecology

## 2023-03-11 ENCOUNTER — Encounter: Payer: Self-pay | Admitting: Sports Medicine

## 2023-03-13 ENCOUNTER — Encounter: Payer: Self-pay | Admitting: Sports Medicine

## 2023-03-13 DIAGNOSIS — E119 Type 2 diabetes mellitus without complications: Secondary | ICD-10-CM

## 2023-03-13 MED ORDER — OZEMPIC (0.25 OR 0.5 MG/DOSE) 2 MG/1.5ML ~~LOC~~ SOPN: PEN_INJECTOR | SUBCUTANEOUS | 1 refills | Status: DC

## 2023-03-26 ENCOUNTER — Encounter: Payer: Self-pay | Admitting: Sports Medicine

## 2023-03-26 DIAGNOSIS — E119 Type 2 diabetes mellitus without complications: Secondary | ICD-10-CM

## 2023-04-17 MED ORDER — TRULICITY 0.75 MG/0.5ML ~~LOC~~ SOAJ: 0.7500 mg | SUBCUTANEOUS | 11 refills | Status: DC

## 2023-04-23 ENCOUNTER — Other Ambulatory Visit: Payer: Self-pay

## 2023-04-23 ENCOUNTER — Ambulatory Visit: Payer: BC Managed Care – PPO | Attending: Neurosurgery | Admitting: Physical Therapy

## 2023-04-23 ENCOUNTER — Encounter: Payer: Self-pay | Admitting: Physical Therapy

## 2023-04-23 DIAGNOSIS — R29898 Other symptoms and signs involving the musculoskeletal system: Secondary | ICD-10-CM | POA: Diagnosis not present

## 2023-04-23 DIAGNOSIS — M6281 Muscle weakness (generalized): Secondary | ICD-10-CM | POA: Diagnosis not present

## 2023-04-23 NOTE — Therapy (Signed)
 OUTPATIENT PHYSICAL THERAPY THORACOLUMBAR EVALUATION   Patient Name: Chelsea [name] MRN: [id number] DOB:25-Oct-1985, 38 y.o., female Today's Date: 04/23/2023  END OF SESSION:  PT End of Session - 04/23/23 1616     Visit Number 1    Number of Visits 16    Date for PT Re-Evaluation 06/18/23    Authorization Type BCBS    Authorization - Visit Number 1    Progress Note Due on Visit 10    PT Start Time [id number]    PT Stop Time 0925    PT Time Calculation (min) 34 min    Activity Tolerance Patient tolerated treatment well    Behavior During Therapy WFL for tasks assessed/performed             Past Medical History:  Diagnosis Date   Depression    Endometrial polyp    Fibromyalgia    History of recurrent UTIs    Hypertension    Infertility, anovulation    Myofascial muscle pain    OA (osteoarthritis) of knee    BILATERAL   PCOS (polycystic ovarian syndrome)    PONV (postoperative nausea and vomiting)    Seasonal allergic rhinitis    Past Surgical History:  Procedure Laterality Date   CHOLECYSTECTOMY N/A 07/11/2021   Procedure: LAPAROSCOPIC CHOLECYSTECTOMY WITH INTRAOPERATIVE CHOLANGIOGRAM;  Surgeon: Manus Rudd, MD;  Location: [facility];  Service: General;  Laterality: N/A;   HYSTEROSCOPY WITH D & C N/A 09/07/2014   Procedure:  DILATATION AND CURETTAGE /HYSTEROSCOPY/POLYPECTOMY;  Surgeon: Fermin Schwab, MD;  Location: [facility];  Service: Gynecology;  Laterality: N/A;   HYSTEROSCOPY WITH D & C N/A 07/25/2021   Procedure: DILATATION AND CURETTAGE /HYSTEROSCOPY;  Surgeon: Jerene Bears, MD;  Location: [facility];  Service: Gynecology;  Laterality: N/A;   I & D RIGHT FOOT  1998   LAPAROSCOPIC GASTRIC SLEEVE RESECTION N/A 10/20/2017   Procedure: LAPAROSCOPIC GASTRIC SLEEVE RESECTION, UPPER ENDO, ERAS Pathway;  Surgeon: Berna Bue, MD;  Location: [facility];  Service: General;  Laterality: N/A;   POLYPECTOMY     uterine   TONSILLECTOMY   2001   WISDOM TOOTH EXTRACTION  2004   Patient Active Problem List   Diagnosis Date Noted   Left cervical radiculopathy 07/02/2022   Right carpal tunnel syndrome 04/01/2022   Left hand tendonitis 02/19/2022   Simple endometrial hyperplasia    Right ankle sprain 07/19/2020   Chronic pain syndrome 05/30/2020   Impingement syndrome of both shoulders 08/17/2019   Primary insomnia 05/11/2019   Mixed hypercholesterolemia and hypertriglyceridemia 11/13/2018   Orgasmic headache 11/11/2018   Diet-controlled diabetes mellitus (HCC) 04/25/2017   Lumbar degenerative disc disease 08/24/2015   Intractable migraine without aura and with status migrainosus 06/24/2014   Myofascial pain syndrome with depression 02/08/2014   Venous stasis dermatitis 01/25/2014   Right hip pain 12/13/2013   Metatarsalgia of both feet 10/18/2013   Primary osteoarthritis of both knees 02/13/2012   Essential hypertension, benign 11/25/2008   POLYCYSTIC OVARIAN DISEASE 10/26/2008   History of gastric sleeve 10/26/2008    PCP: Benjamin Stain  REFERRING PROVIDER: Lisbeth Renshaw  REFERRING DIAG: herniated nucleus propulsus, lumbar  Rationale for Evaluation and Treatment: Rehabilitation  THERAPY DIAG:  Muscle weakness (generalized)  Other symptoms and signs involving the musculoskeletal system  ONSET DATE: 12/2022  SUBJECTIVE:  SUBJECTIVE STATEMENT: Pt is s/p L3-4 microdiscectomy 12/2022. Prior to surgery she was having severe pain down her Rt LE but that has resolved. She states that now she is having difficulty with bending forward, standing or walking for prolonged periods. She states she is limited by hip,knee and foot pain. Pain starts in low back and travels down. She state she has less energy due to the chronic and prolonged  pain.  PERTINENT HISTORY:  Fibromyalgia, chronic pain syndrome  PAIN:  Are you having pain? Yes: NPRS scale: 4/10 currently, up to 9/10 Pain location: low back down LEs Pain description: sore, achey, sharp Aggravating factors: bending, prolonged standing or walking Relieving factors: rest, meds  PRECAUTIONS: None  RED FLAGS: None   WEIGHT BEARING RESTRICTIONS: No  FALLS:  Has patient fallen in last 6 months? No   OCCUPATION: not working  PLOF: Independent  PATIENT GOALS: decrease pain, be able to stand and walk longer  NEXT MD VISIT: 04/2023  OBJECTIVE:  Note: Objective measures were completed at Evaluation unless otherwise noted.   PATIENT SURVEYS:  Modified Oswestry 24/50 : 48%   COGNITION: Overall cognitive status: Within functional limits for tasks assessed     SENSATION:   MUSCLE LENGTH: Hamstrings: Right 60 deg; Left 40 deg   PALPATION: Increased mm spasticity lumbar paraspinals Multiple trigger points throughout postural musculature - pt states this is common due to fibromyalgia  LUMBAR ROM:   AROM eval  Flexion 50% pain  Extension 50%  Right lateral flexion 50%  Left lateral flexion 50%  Right rotation WFL  Left rotation WFL   (Blank rows = not tested)   LOWER EXTREMITY MMT:    MMT Right eval Left eval  Hip flexion 3+ 3+  Hip extension 3+ 3+  Hip abduction 4- 4-  Hip adduction    Hip internal rotation    Hip external rotation    Knee flexion    Knee extension    Ankle dorsiflexion    Ankle plantarflexion    Ankle inversion    Ankle eversion     (Blank rows = not tested)   FUNCTIONAL TESTS:  5 times sit to stand: 20.94 seconds 3 minute walk test: 100 meters    TREATMENT DATE: 04/23/23 See HEP Pt educated on PT POC and goals, rationale for treatment                                                                                                                                 PATIENT EDUCATION:  Education details: PT  POC and goals, HEP Person educated: Patient Education method: Explanation, Demonstration, and Handouts Education comprehension: verbalized understanding and returned demonstration  HOME EXERCISE PROGRAM: Access Code: [email or web address] URL: [email or web address] Date: 04/23/2023 Prepared by: Reggy Eye  Program Notes walking 3 minutes at a time, 4-5 times a day  Exercises - Sit to Stand Without Arm Support  - 1 x daily - 7 x weekly -  3 sets - 10 reps - Heel Raises with Counter Support  - 1 x daily - 7 x weekly - 3 sets - 10 reps - Hip Abduction with Resistance Loop  - 1 x daily - 7 x weekly - 3 sets - 10 reps - Hip Extension with Resistance Loop  - 1 x daily - 7 x weekly - 3 sets - 10 reps  ASSESSMENT:  CLINICAL IMPRESSION: Patient is a 38 y.o. female who was seen today for physical therapy evaluation and treatment for herniated disc s/p microdiscectomy. She presents with decreased strength and activity tolerance, increased pain and decreased functional mobility. Pt will benefit from skilled PT to address deficits and improve functional mobility.   OBJECTIVE IMPAIRMENTS: decreased activity tolerance, decreased mobility, difficulty walking, decreased ROM, decreased strength, and pain.   ACTIVITY LIMITATIONS: lifting, standing, and locomotion level  PARTICIPATION LIMITATIONS: meal prep, cleaning, shopping, community activity, and occupation  PERSONAL FACTORS: Time since onset of injury/illness/exacerbation and 1-2 comorbidities: fibromyalgia, recent surgery  are also affecting patient's functional outcome.   REHAB POTENTIAL: Good  CLINICAL DECISION MAKING: Stable/uncomplicated  EVALUATION COMPLEXITY: Low   GOALS: Goals reviewed with patient? Yes  SHORT TERM GOALS: Target date: 05/21/2023    Pt will be independent with initial HEP Baseline: Goal status: INITIAL  2.  Pt will demo improved LE strength with 5 x STS <= 10 seconds Baseline:  Goal status:  INITIAL    LONG TERM GOALS: Target date: 06/18/2023    Pt will be independent in advanced HEP Baseline:  Goal status: INITIAL  2.  Pt will report reaching to bottom shelf or cabinet with pain <= 5/10 Baseline:  Goal status: INITIAL  3.  Pt will improve 3 minute walk to >= 200 meters Baseline:  Goal status: INITIAL  4.  Pt will tolerate standing x 10 minutes with pain <= 6/10 Baseline:  Goal status: INITIAL    PLAN:  PT FREQUENCY: 1-2x/week  PT DURATION: 8 weeks  PLANNED INTERVENTIONS: 97164- PT Re-evaluation, 97110-Therapeutic exercises, 97530- Therapeutic activity, 97112- Neuromuscular re-education, 97535- Self Care, 40347- Manual therapy, [id number]- Aquatic Therapy, Patient/Family education, Balance training, Cryotherapy, and Moist heat.  PLAN FOR NEXT SESSION: assess response to HEP, functional strength   For all possible CPT codes, reference the Planned Interventions line above.     Check all conditions that are expected to impact treatment: {Conditions expected to impact treatment:Musculoskeletal disorders   If treatment provided at initial evaluation, no treatment charged due to lack of authorization.      Jiyan Walkowski, PT 04/23/2023, 4:17 PM

## 2023-04-28 ENCOUNTER — Ambulatory Visit: Payer: BC Managed Care – PPO | Attending: Neurosurgery

## 2023-04-28 DIAGNOSIS — M6281 Muscle weakness (generalized): Secondary | ICD-10-CM

## 2023-04-28 DIAGNOSIS — R29898 Other symptoms and signs involving the musculoskeletal system: Secondary | ICD-10-CM

## 2023-04-28 NOTE — Therapy (Signed)
 OUTPATIENT PHYSICAL THERAPY THORACOLUMBAR TREATMENT    Patient Name: Chelsea [name] MRN: [id number] DOB:[birth year], 38 y.o., female Today's Date: 04/28/2023  END OF SESSION:  PT End of Session - 04/28/23 1446     Visit Number 2    Number of Visits 16    Date for PT Re-Evaluation 06/18/23    Authorization Type BCBS    Progress Note Due on Visit 10    PT Start Time 1446    PT Stop Time 1524    PT Time Calculation (min) 38 min    Activity Tolerance Patient tolerated treatment well    Behavior During Therapy WFL for tasks assessed/performed              Past Medical History:  Diagnosis Date   Depression    Endometrial polyp    Fibromyalgia    History of recurrent UTIs    Hypertension    Infertility, anovulation    Myofascial muscle pain    OA (osteoarthritis) of knee    BILATERAL   PCOS (polycystic ovarian syndrome)    PONV (postoperative nausea and vomiting)    Seasonal allergic rhinitis    Past Surgical History:  Procedure Laterality Date   CHOLECYSTECTOMY N/A 07/11/2021   Procedure: LAPAROSCOPIC CHOLECYSTECTOMY WITH INTRAOPERATIVE CHOLANGIOGRAM;  Surgeon: Manus Rudd, MD;  Location: [facility];  Service: General;  Laterality: N/A;   HYSTEROSCOPY WITH D & C N/A 09/07/2014   Procedure:  DILATATION AND CURETTAGE /HYSTEROSCOPY/POLYPECTOMY;  Surgeon: Fermin Schwab, MD;  Location: [facility];  Service: Gynecology;  Laterality: N/A;   HYSTEROSCOPY WITH D & C N/A 07/25/2021   Procedure: DILATATION AND CURETTAGE /HYSTEROSCOPY;  Surgeon: Jerene Bears, MD;  Location: [facility];  Service: Gynecology;  Laterality: N/A;   I & D RIGHT FOOT  1998   LAPAROSCOPIC GASTRIC SLEEVE RESECTION N/A 10/20/2017   Procedure: LAPAROSCOPIC GASTRIC SLEEVE RESECTION, UPPER ENDO, ERAS Pathway;  Surgeon: Berna Bue, MD;  Location: [facility];  Service: General;  Laterality: N/A;   POLYPECTOMY     uterine   TONSILLECTOMY  2001   WISDOM TOOTH EXTRACTION  2004    Patient Active Problem List   Diagnosis Date Noted   Left cervical radiculopathy 07/02/2022   Right carpal tunnel syndrome 04/01/2022   Left hand tendonitis 02/19/2022   Simple endometrial hyperplasia    Right ankle sprain 07/19/2020   Chronic pain syndrome 05/30/2020   Impingement syndrome of both shoulders 08/17/2019   Primary insomnia 05/11/2019   Mixed hypercholesterolemia and hypertriglyceridemia 11/13/2018   Orgasmic headache 11/11/2018   Diet-controlled diabetes mellitus (HCC) 04/25/2017   Lumbar degenerative disc disease 08/24/2015   Intractable migraine without aura and with status migrainosus 06/24/2014   Myofascial pain syndrome with depression 02/08/2014   Venous stasis dermatitis 01/25/2014   Right hip pain 12/13/2013   Metatarsalgia of both feet 10/18/2013   Primary osteoarthritis of both knees 02/13/2012   Essential hypertension, benign 11/25/2008   POLYCYSTIC OVARIAN DISEASE 10/26/2008   History of gastric sleeve 10/26/2008    PCP: Benjamin Stain  REFERRING PROVIDER: Lisbeth Renshaw  REFERRING DIAG: herniated nucleus propulsus, lumbar  Rationale for Evaluation and Treatment: Rehabilitation  THERAPY DIAG:  Muscle weakness (generalized)  Other symptoms and signs involving the musculoskeletal system  ONSET DATE: 12/2022  SUBJECTIVE:  SUBJECTIVE STATEMENT: Patient reports she is doing ok. Has been doing some of her exercises. Still has pain and thinks it's from her fibromyalgia.   EVAL: Pt is s/p L3-4 microdiscectomy 12/2022. Prior to surgery she was having severe pain down her Rt LE but that has resolved. She states that now she is having difficulty with bending forward, standing or walking for prolonged periods. She states she is limited by hip,knee and foot pain. Pain  starts in low back and travels down. She state she has less energy due to the chronic and prolonged pain.  PERTINENT HISTORY:  Fibromyalgia, chronic pain syndrome  PAIN:  Are you having pain? Yes: NPRS scale: 4/10 Pain location: generalized Pain description: sore, tight  Aggravating factors: bending, prolonged standing or walking Relieving factors: rest, meds  PRECAUTIONS: None  RED FLAGS: None   WEIGHT BEARING RESTRICTIONS: No  FALLS:  Has patient fallen in last 6 months? No   OCCUPATION: not working  PLOF: Independent  PATIENT GOALS: decrease pain, be able to stand and walk longer  NEXT MD VISIT: 04/2023  OBJECTIVE:  Note: Objective measures were completed at Evaluation unless otherwise noted.   PATIENT SURVEYS:  Modified Oswestry 24/50 : 48%   COGNITION: Overall cognitive status: Within functional limits for tasks assessed     SENSATION:   MUSCLE LENGTH: Hamstrings: Right 60 deg; Left 40 deg   PALPATION: Increased mm spasticity lumbar paraspinals Multiple trigger points throughout postural musculature - pt states this is common due to fibromyalgia  LUMBAR ROM:   AROM eval  Flexion 50% pain  Extension 50%  Right lateral flexion 50%  Left lateral flexion 50%  Right rotation WFL  Left rotation WFL   (Blank rows = not tested)   LOWER EXTREMITY MMT:    MMT Right eval Left eval  Hip flexion 3+ 3+  Hip extension 3+ 3+  Hip abduction 4- 4-  Hip adduction    Hip internal rotation    Hip external rotation    Knee flexion    Knee extension    Ankle dorsiflexion    Ankle plantarflexion    Ankle inversion    Ankle eversion     (Blank rows = not tested)   FUNCTIONAL TESTS:  5 times sit to stand: 20.94 seconds 3 minute walk test: 100 meters  [facility] Adult PT Treatment:                                                DATE: 04/28/23 Therapeutic Exercise: LTR x 1 minute HEP review discussing frequency, sets, reps Figure 4 stretch x 30 sec each   SKTC x 10; 5 sec hold  Open book x 10 each  Updated HEP   Neuromuscular re-ed: Supine posterior pelvic tilts 2 x 10; 5 sec hold  Supine bent knee fallout 2 x 10    Self Care: Discussed energy conservation with handout provided  Walking program to implement    TREATMENT DATE: 04/23/23 See HEP Pt educated on PT POC and goals, rationale for treatment  PATIENT EDUCATION:  Education details: HEP; see treatment  Person educated: Patient Education method: Explanation, Demonstration, and Handouts Education comprehension: verbalized understanding and returned demonstration  HOME EXERCISE PROGRAM: Access Code: [email or web address] URL: [email or web address] Date: 04/28/2023 Prepared by: Letitia Libra  Program Notes walking 3 minutes at a time, 4-5 times a day  Exercises - Sit to Stand Without Arm Support  - 1 x daily - 7 x weekly - 3 sets - 10 reps - Heel Raises with Counter Support  - 1 x daily - 7 x weekly - 3 sets - 10 reps - Hip Abduction with Resistance Loop  - 1 x daily - 7 x weekly - 3 sets - 10 reps - Hip Extension with Resistance Loop  - 1 x daily - 7 x weekly - 3 sets - 10 reps - Hooklying Single Knee to Chest Stretch with Towel  - 1 x daily - 7 x weekly - 1 sets - 10 reps - 5 sec  hold - Sidelying Thoracic Rotation with Open Book  - 1 x daily - 7 x weekly - 1 sets - 10 reps  Patient Education - Network engineer - House[employer]  ASSESSMENT:  CLINICAL IMPRESSION: Patient arrives with generalized soreness/tightness. We reviewed HEP discussing frequency, sets, and reps with patient verbalizing understanding. Focused on trunk/hip mobility and core stabilization today without an increase in pain reported.   EVAL: Patient is a 38 y.o. female who was seen today for physical therapy evaluation and treatment for herniated disc s/p  microdiscectomy. She presents with decreased strength and activity tolerance, increased pain and decreased functional mobility. Pt will benefit from skilled PT to address deficits and improve functional mobility.   OBJECTIVE IMPAIRMENTS: decreased activity tolerance, decreased mobility, difficulty walking, decreased ROM, decreased strength, and pain.   ACTIVITY LIMITATIONS: lifting, standing, and locomotion level  PARTICIPATION LIMITATIONS: meal prep, cleaning, shopping, community activity, and occupation  PERSONAL FACTORS: Time since onset of injury/illness/exacerbation and 1-2 comorbidities: fibromyalgia, recent surgery  are also affecting patient's functional outcome.   REHAB POTENTIAL: Good  CLINICAL DECISION MAKING: Stable/uncomplicated  EVALUATION COMPLEXITY: Low   GOALS: Goals reviewed with patient? Yes  SHORT TERM GOALS: Target date: 05/21/2023    Pt will be independent with initial HEP Baseline: Goal status: INITIAL  2.  Pt will demo improved LE strength with 5 x STS <= 10 seconds Baseline:  Goal status: INITIAL    LONG TERM GOALS: Target date: 06/18/2023    Pt will be independent in advanced HEP Baseline:  Goal status: INITIAL  2.  Pt will report reaching to bottom shelf or cabinet with pain <= 5/10 Baseline:  Goal status: INITIAL  3.  Pt will improve 3 minute walk to >= 200 meters Baseline:  Goal status: INITIAL  4.  Pt will tolerate standing x 10 minutes with pain <= 6/10 Baseline:  Goal status: INITIAL    PLAN:  PT FREQUENCY: 1-2x/week  PT DURATION: 8 weeks  PLANNED INTERVENTIONS: 97164- PT Re-evaluation, 97110-Therapeutic exercises, 97530- Therapeutic activity, 97112- Neuromuscular re-education, 97535- Self Care, 54098- Manual therapy, [id number]- Aquatic Therapy, Patient/Family education, Balance training, Cryotherapy, and Moist heat.  PLAN FOR NEXT SESSION: assess response to HEP, functional strength   Letitia Libra, PT, DPT, ATC 04/28/23  3:24 PM

## 2023-04-30 ENCOUNTER — Ambulatory Visit: Payer: BC Managed Care – PPO | Admitting: Physical Therapy

## 2023-04-30 ENCOUNTER — Encounter: Payer: Self-pay | Admitting: Physical Therapy

## 2023-04-30 DIAGNOSIS — M6281 Muscle weakness (generalized): Secondary | ICD-10-CM | POA: Diagnosis not present

## 2023-04-30 DIAGNOSIS — R29898 Other symptoms and signs involving the musculoskeletal system: Secondary | ICD-10-CM

## 2023-04-30 NOTE — Therapy (Signed)
 OUTPATIENT PHYSICAL THERAPY THORACOLUMBAR TREATMENT    Patient Name: Chelsea [name] MRN: [id number] DOB:[birth year], 38 y.o., female Today's Date: 04/30/2023  END OF SESSION:  PT End of Session - 04/30/23 1457     Visit Number 3    Number of Visits 16    Date for PT Re-Evaluation 06/18/23    Authorization Type BC[employer]    Authorization Time Period 13 visits approved 04/22/23-07/22/23    Authorization - Visit Number 2    Authorization - Number of Visits 13    Progress Note Due on Visit 10    PT Start Time 1445    PT Stop Time 1528    PT Time Calculation (min) 43 min    Activity Tolerance Patient tolerated treatment well    Behavior During Therapy WFL for tasks assessed/performed               Past Medical History:  Diagnosis Date   Depression    Endometrial polyp    Fibromyalgia    History of recurrent UTIs    Hypertension    Infertility, anovulation    Myofascial muscle pain    OA (osteoarthritis) of knee    BILATERAL   PCOS (polycystic ovarian syndrome)    PONV (postoperative nausea and vomiting)    Seasonal allergic rhinitis    Past Surgical History:  Procedure Laterality Date   CHOLECYSTECTOMY N/A 07/11/2021   Procedure: LAPAROSCOPIC CHOLECYSTECTOMY WITH INTRAOPERATIVE CHOLANGIOGRAM;  Surgeon: Manus Rudd, MD;  Location: [facility];  Service: General;  Laterality: N/A;   HYSTEROSCOPY WITH D & C N/A 09/07/2014   Procedure:  DILATATION AND CURETTAGE /HYSTEROSCOPY/POLYPECTOMY;  Surgeon: Fermin Schwab, MD;  Location: [facility];  Service: Gynecology;  Laterality: N/A;   HYSTEROSCOPY WITH D & C N/A 07/25/2021   Procedure: DILATATION AND CURETTAGE /HYSTEROSCOPY;  Surgeon: Jerene Bears, MD;  Location: [facility];  Service: Gynecology;  Laterality: N/A;   I & D RIGHT FOOT  1998   LAPAROSCOPIC GASTRIC SLEEVE RESECTION N/A 10/20/2017   Procedure: LAPAROSCOPIC GASTRIC SLEEVE RESECTION, UPPER ENDO, ERAS Pathway;  Surgeon: Berna Bue,  MD;  Location: [facility];  Service: General;  Laterality: N/A;   POLYPECTOMY     uterine   TONSILLECTOMY  2001   WISDOM TOOTH EXTRACTION  2004   Patient Active Problem List   Diagnosis Date Noted   Left cervical radiculopathy 07/02/2022   Right carpal tunnel syndrome 04/01/2022   Left hand tendonitis 02/19/2022   Simple endometrial hyperplasia    Right ankle sprain 07/19/2020   Chronic pain syndrome 05/30/2020   Impingement syndrome of both shoulders 08/17/2019   Primary insomnia 05/11/2019   Mixed hypercholesterolemia and hypertriglyceridemia 11/13/2018   Orgasmic headache 11/11/2018   Diet-controlled diabetes mellitus (HCC) 04/25/2017   Lumbar degenerative disc disease 08/24/2015   Intractable migraine without aura and with status migrainosus 06/24/2014   Myofascial pain syndrome with depression 02/08/2014   Venous stasis dermatitis 01/25/2014   Right hip pain 12/13/2013   Metatarsalgia of both feet 10/18/2013   Primary osteoarthritis of both knees 02/13/2012   Essential hypertension, benign 11/25/2008   POLYCYSTIC OVARIAN DISEASE 10/26/2008   History of gastric sleeve 10/26/2008    PCP: Benjamin Stain  REFERRING PROVIDER: Lisbeth Renshaw  REFERRING DIAG: herniated nucleus propulsus, lumbar  Rationale for Evaluation and Treatment: Rehabilitation  THERAPY DIAG:  Muscle weakness (generalized)  Other symptoms and signs involving the musculoskeletal system  ONSET DATE: 12/2022  SUBJECTIVE:  SUBJECTIVE STATEMENT: Pt states she is sore in her muscles from the exercises but that she has been performing her HEP  EVAL: Pt is s/p L3-4 microdiscectomy 12/2022. Prior to surgery she was having severe pain down her Rt LE but that has resolved. She states that now she is having difficulty with  bending forward, standing or walking for prolonged periods. She states she is limited by hip,knee and foot pain. Pain starts in low back and travels down. She state she has less energy due to the chronic and prolonged pain.  PERTINENT HISTORY:  Fibromyalgia, chronic pain syndrome  PAIN:  Are you having pain? Yes: NPRS scale: 4/10 Pain location: generalized Pain description: sore, tight  Aggravating factors: bending, prolonged standing or walking Relieving factors: rest, meds  PRECAUTIONS: None  RED FLAGS: None   WEIGHT BEARING RESTRICTIONS: No  FALLS:  Has patient fallen in last 6 months? No   OCCUPATION: not working  PLOF: Independent  PATIENT GOALS: decrease pain, be able to stand and walk longer  NEXT MD VISIT: 04/2023  OBJECTIVE:  Note: Objective measures were completed at Evaluation unless otherwise noted.   PATIENT SURVEYS:  Modified Oswestry 24/50 : 48%   COGNITION: Overall cognitive status: Within functional limits for tasks assessed     SENSATION:   MUSCLE LENGTH: Hamstrings: Right 60 deg; Left 40 deg   PALPATION: Increased mm spasticity lumbar paraspinals Multiple trigger points throughout postural musculature - pt states this is common due to fibromyalgia  LUMBAR ROM:   AROM eval  Flexion 50% pain  Extension 50%  Right lateral flexion 50%  Left lateral flexion 50%  Right rotation WFL  Left rotation WFL   (Blank rows = not tested)   LOWER EXTREMITY MMT:    MMT Right eval Left eval  Hip flexion 3+ 3+  Hip extension 3+ 3+  Hip abduction 4- 4-  Hip adduction    Hip internal rotation    Hip external rotation    Knee flexion    Knee extension    Ankle dorsiflexion    Ankle plantarflexion    Ankle inversion    Ankle eversion     (Blank rows = not tested)   FUNCTIONAL TESTS:  5 times sit to stand: 20.94 seconds 3 minute walk test: 100 meters  [facility] Adult PT Treatment:                                                DATE:  04/30/23 Therapeutic Exercise/Activity/NMR: LTR x 1 min Figure 4 stretch x 30 sec bilat SKTC 5 x 10 sec bilat Supine posterior pelvic tilt 2 x 10 Resisted walking 10# fwd/bkwd/laterally all x 10 Hip abd red TB 2 x 10 Hip ext red TB 2 x 10 Open book x 10 each  Walking x 5 minutes    [facility] Adult PT Treatment:                                                DATE: 04/28/23 Therapeutic Exercise: LTR x 1 minute HEP review discussing frequency, sets, reps Figure 4 stretch x 30 sec each  SKTC x 10; 5 sec hold  Open book x 10 each  Updated HEP   Neuromuscular  re-ed: Supine posterior pelvic tilts 2 x 10; 5 sec hold  Supine bent knee fallout 2 x 10    Self Care: Discussed energy conservation with handout provided  Walking program to implement    TREATMENT DATE: 04/23/23 See HEP Pt educated on PT POC and goals, rationale for treatment                                                                                                                                 PATIENT EDUCATION:  Education details: HEP; see treatment  Person educated: Patient Education method: Explanation, Demonstration, and Handouts Education comprehension: verbalized understanding and returned demonstration  HOME EXERCISE PROGRAM: Access Code: [email or web address] URL: [email or web address] Date: 04/28/2023 Prepared by: Letitia Libra  Program Notes walking 3 minutes at a time, 4-5 times a day  Exercises - Sit to Stand Without Arm Support  - 1 x daily - 7 x weekly - 3 sets - 10 reps - Heel Raises with Counter Support  - 1 x daily - 7 x weekly - 3 sets - 10 reps - Hip Abduction with Resistance Loop  - 1 x daily - 7 x weekly - 3 sets - 10 reps - Hip Extension with Resistance Loop  - 1 x daily - 7 x weekly - 3 sets - 10 reps - Hooklying Single Knee to Chest Stretch with Towel  - 1 x daily - 7 x weekly - 1 sets - 10 reps - 5 sec  hold - Sidelying Thoracic Rotation with Open Book  - 1 x daily - 7 x weekly - 1  sets - 10 reps  Patient Education - Network engineer - Housekeeping  ASSESSMENT:  CLINICAL IMPRESSION: Pt able to walk x 5 minutes today without rest. She is improving endurance. She was able to tolerate progression of exercises without increase in symptoms. Progressing well  EVAL: Patient is a 38 y.o. female who was seen today for physical therapy evaluation and treatment for herniated disc s/p microdiscectomy. She presents with decreased strength and activity tolerance, increased pain and decreased functional mobility. Pt will benefit from skilled PT to address deficits and improve functional mobility.     GOALS: Goals reviewed with patient? Yes  SHORT TERM GOALS: Target date: 05/21/2023    Pt will be independent with initial HEP Baseline: Goal status: INITIAL  2.  Pt will demo improved LE strength with 5 x STS <= 10 seconds Baseline:  Goal status: INITIAL    LONG TERM GOALS: Target date: 06/18/2023    Pt will be independent in advanced HEP Baseline:  Goal status: INITIAL  2.  Pt will report reaching to bottom shelf or cabinet with pain <= 5/10 Baseline:  Goal status: INITIAL  3.  Pt will improve 3 minute walk to >= 200 meters Baseline:  Goal status: INITIAL  4.  Pt will tolerate standing x 10 minutes with pain <= 6/10 Baseline:  Goal status: INITIAL  PLAN:  PT FREQUENCY: 1-2x/week  PT DURATION: 8 weeks  PLANNED INTERVENTIONS: 97164- PT Re-evaluation, 97110-Therapeutic exercises, 97530- Therapeutic activity, [id number]- Neuromuscular re-education, 97535- Self Care, [id number]- Manual therapy, [phone]- Aquatic Therapy, Patient/Family education, Balance training, Cryotherapy, and Moist heat.  PLAN FOR NEXT SESSION: update HEP as needed, functional strength and endurance   Reggy Eye, PT,DPT03/05/253:29 PM

## 2023-05-05 ENCOUNTER — Ambulatory Visit: Payer: BC Managed Care – PPO

## 2023-05-05 DIAGNOSIS — R29898 Other symptoms and signs involving the musculoskeletal system: Secondary | ICD-10-CM | POA: Diagnosis not present

## 2023-05-05 DIAGNOSIS — M6281 Muscle weakness (generalized): Secondary | ICD-10-CM

## 2023-05-05 NOTE — Therapy (Signed)
 OUTPATIENT PHYSICAL THERAPY THORACOLUMBAR TREATMENT    Patient Name: Chelsea [name] MRN: [id number] DOB:1985-11-26, 38 y.o., female Today's Date: 05/05/2023  END OF SESSION:  PT End of Session - 05/05/23 1446     Visit Number 4    Number of Visits 16    Date for PT Re-Evaluation 06/18/23    Authorization Type BCBS    Authorization Time Period 13 visits approved 04/22/23-07/22/23    Authorization - Visit Number 4    Authorization - Number of Visits 13    Progress Note Due on Visit 10    PT Start Time 1448    PT Stop Time 1526    PT Time Calculation (min) 38 min    Activity Tolerance Patient tolerated treatment well    Behavior During Therapy WFL for tasks assessed/performed            Past Medical History:  Diagnosis Date   Depression    Endometrial polyp    Fibromyalgia    History of recurrent UTIs    Hypertension    Infertility, anovulation    Myofascial muscle pain    OA (osteoarthritis) of knee    BILATERAL   PCOS (polycystic ovarian syndrome)    PONV (postoperative nausea and vomiting)    Seasonal allergic rhinitis    Past Surgical History:  Procedure Laterality Date   CHOLECYSTECTOMY N/A 07/11/2021   Procedure: LAPAROSCOPIC CHOLECYSTECTOMY WITH INTRAOPERATIVE CHOLANGIOGRAM;  Surgeon: Manus Rudd, MD;  Location: [facility];  Service: General;  Laterality: N/A;   HYSTEROSCOPY WITH D & C N/A 09/07/2014   Procedure:  DILATATION AND CURETTAGE /HYSTEROSCOPY/POLYPECTOMY;  Surgeon: Fermin Schwab, MD;  Location: [facility];  Service: Gynecology;  Laterality: N/A;   HYSTEROSCOPY WITH D & C N/A 07/25/2021   Procedure: DILATATION AND CURETTAGE /HYSTEROSCOPY;  Surgeon: Jerene Bears, MD;  Location: [facility];  Service: Gynecology;  Laterality: N/A;   I & D RIGHT FOOT  1998   LAPAROSCOPIC GASTRIC SLEEVE RESECTION N/A 10/20/2017   Procedure: LAPAROSCOPIC GASTRIC SLEEVE RESECTION, UPPER ENDO, ERAS Pathway;  Surgeon: Berna Bue, MD;   Location: [facility];  Service: General;  Laterality: N/A;   POLYPECTOMY     uterine   TONSILLECTOMY  2001   WISDOM TOOTH EXTRACTION  2004   Patient Active Problem List   Diagnosis Date Noted   Left cervical radiculopathy 07/02/2022   Right carpal tunnel syndrome 04/01/2022   Left hand tendonitis 02/19/2022   Simple endometrial hyperplasia    Right ankle sprain 07/19/2020   Chronic pain syndrome 05/30/2020   Impingement syndrome of both shoulders 08/17/2019   Primary insomnia 05/11/2019   Mixed hypercholesterolemia and hypertriglyceridemia 11/13/2018   Orgasmic headache 11/11/2018   Diet-controlled diabetes mellitus (HCC) 04/25/2017   Lumbar degenerative disc disease 08/24/2015   Intractable migraine without aura and with status migrainosus 06/24/2014   Myofascial pain syndrome with depression 02/08/2014   Venous stasis dermatitis 01/25/2014   Right hip pain 12/13/2013   Metatarsalgia of both feet 10/18/2013   Primary osteoarthritis of both knees 02/13/2012   Essential hypertension, benign 11/25/2008   POLYCYSTIC OVARIAN DISEASE 10/26/2008   History of gastric sleeve 10/26/2008    PCP: Benjamin Stain  REFERRING PROVIDER: Lisbeth Renshaw  REFERRING DIAG: herniated nucleus propulsus, lumbar  Rationale for Evaluation and Treatment: Rehabilitation  THERAPY DIAG:  Muscle weakness (generalized)  Other symptoms and signs involving the musculoskeletal system  ONSET DATE: 12/2022  SUBJECTIVE:  SUBJECTIVE STATEMENT: Patient reports she continue to be sore in back of her thighs and low back with HEP.   EVAL: Pt is s/p L3-4 microdiscectomy 12/2022. Prior to surgery she was having severe pain down her Rt LE but that has resolved. She states that now she is having difficulty with bending forward,  standing or walking for prolonged periods. She states she is limited by hip,knee and foot pain. Pain starts in low back and travels down. She state she has less energy due to the chronic and prolonged pain.  PERTINENT HISTORY:  Fibromyalgia, chronic pain syndrome  PAIN:  Are you having pain? Yes: NPRS scale: 4/10 Pain location: generalized Pain description: sore, tight  Aggravating factors: bending, prolonged standing or walking Relieving factors: rest, meds  PRECAUTIONS: None  RED FLAGS: None   WEIGHT BEARING RESTRICTIONS: No  FALLS:  Has patient fallen in last 6 months? No   OCCUPATION: not working  PLOF: Independent  PATIENT GOALS: decrease pain, be able to stand and walk longer  NEXT MD VISIT: 04/2023  OBJECTIVE:  Note: Objective measures were completed at Evaluation unless otherwise noted.   PATIENT SURVEYS:  Modified Oswestry 24/50 : 48%   COGNITION: Overall cognitive status: Within functional limits for tasks assessed     SENSATION:   MUSCLE LENGTH: Hamstrings: Right 60 deg; Left 40 deg   PALPATION: Increased mm spasticity lumbar paraspinals Multiple trigger points throughout postural musculature - pt states this is common due to fibromyalgia  LUMBAR ROM:   AROM eval  Flexion 50% pain  Extension 50%  Right lateral flexion 50%  Left lateral flexion 50%  Right rotation WFL  Left rotation WFL   (Blank rows = not tested)   LOWER EXTREMITY MMT:    MMT Right eval Left eval  Hip flexion 3+ 3+  Hip extension 3+ 3+  Hip abduction 4- 4-  Hip adduction    Hip internal rotation    Hip external rotation    Knee flexion    Knee extension    Ankle dorsiflexion    Ankle plantarflexion    Ankle inversion    Ankle eversion     (Blank rows = not tested)   FUNCTIONAL TESTS:  5 times sit to stand: 20.94 seconds 3 minute walk test: 100 meters   [facility] Adult PT Treatment:                                                DATE: 05/05/2023 Therapeutic  Exercise: Self-massage glute with 4" ball Supine:  Sciatic nerve glide Core marching Hooklying hip add iso (ball squeeze) 10x5" Hooklying clamshell + blue TB 2x10 Seated hip hinge trunk flexion (flat back) Standing glute med wall press with ball (B) Manual Therapy: IASTM lateral quad/ITB, glutes Therapeutic Activity: Walking for endurance 80'x3 Side stepping Side marching   [facility] Adult PT Treatment:                                                DATE: 04/30/23 Therapeutic Exercise/Activity/NMR: LTR x 1 min Figure 4 stretch x 30 sec bilat SKTC 5 x 10 sec bilat Supine posterior pelvic tilt 2 x 10 Resisted walking 10# fwd/bkwd/laterally all x 10 Hip abd red TB 2  x 10 Hip ext red TB 2 x 10 Open book x 10 each  Walking x 5 minutes    [facility] Adult PT Treatment:                                                DATE: 04/28/23 Therapeutic Exercise: LTR x 1 minute HEP review discussing frequency, sets, reps Figure 4 stretch x 30 sec each  SKTC x 10; 5 sec hold  Open book x 10 each  Updated HEP  Neuromuscular re-ed: Supine posterior pelvic tilts 2 x 10; 5 sec hold  Supine bent knee fallout 2 x 10  Self Care: Discussed energy conservation with handout provided  Walking program to implement                                                                                                                  PATIENT EDUCATION:  Education details: HEP; see treatment  Person educated: Patient Education method: Explanation, Demonstration, and Handouts Education comprehension: verbalized understanding and returned demonstration  HOME EXERCISE PROGRAM: Access Code: [email or web address] URL: [email or web address] Date: 04/28/2023 Prepared by: Letitia Libra  Program Notes walking 3 minutes at a time, 4-5 times a day  Exercises - Sit to Stand Without Arm Support  - 1 x daily - 7 x weekly - 3 sets - 10 reps - Heel Raises with Counter Support  - 1 x daily - 7 x weekly - 3 sets - 10 reps -  Hip Abduction with Resistance Loop  - 1 x daily - 7 x weekly - 3 sets - 10 reps - Hip Extension with Resistance Loop  - 1 x daily - 7 x weekly - 3 sets - 10 reps - Hooklying Single Knee to Chest Stretch with Towel  - 1 x daily - 7 x weekly - 1 sets - 10 reps - 5 sec  hold - Sidelying Thoracic Rotation with Open Book  - 1 x daily - 7 x weekly - 1 sets - 10 reps  Patient Education - Network engineer - Housekeeping  ASSESSMENT:  CLINICAL IMPRESSION: Seated hip hinge incorporated to promote proper lifting mechanics and alignment. Functional hip strengthening and dynamic balance challenged with side stepping and marching. Patient instructed in self-massage technique with myofascial ball to address tightness/tension along R glutes/posterior thigh.  EVAL: Patient is a 38 y.o. female who was seen today for physical therapy evaluation and treatment for herniated disc s/p microdiscectomy. She presents with decreased strength and activity tolerance, increased pain and decreased functional mobility. Pt will benefit from skilled PT to address deficits and improve functional mobility.     GOALS: Goals reviewed with patient? Yes  SHORT TERM GOALS: Target date: 05/21/2023  Pt will be independent with initial HEP Baseline: Goal status: INITIAL  2.  Pt will demo improved LE strength with 5 x STS <= 10 seconds Baseline:  Goal  status: INITIAL    LONG TERM GOALS: Target date: 06/18/2023  Pt will be independent in advanced HEP Baseline:  Goal status: INITIAL  2.  Pt will report reaching to bottom shelf or cabinet with pain <= 5/10 Baseline:  Goal status: INITIAL  3.  Pt will improve 3 minute walk to >= 200 meters Baseline:  Goal status: INITIAL  4.  Pt will tolerate standing x 10 minutes with pain <= 6/10 Baseline:  Goal status: INITIAL    PLAN:  PT FREQUENCY: 1-2x/week  PT DURATION: 8 weeks  PLANNED INTERVENTIONS: 97164- PT Re-evaluation, 97110-Therapeutic  exercises, 97530- Therapeutic activity, 97112- Neuromuscular re-education, 97535- Self Care, [id number]- Manual therapy, [id number]- Aquatic Therapy, Patient/Family education, Balance training, Cryotherapy, and Moist heat.  PLAN FOR NEXT SESSION: update HEP as needed, functional strength and endurance   Carlynn Herald, PTA 05/05/23, 3:27 PM

## 2023-05-07 ENCOUNTER — Encounter: Payer: Self-pay | Admitting: Physical Therapy

## 2023-05-07 ENCOUNTER — Ambulatory Visit: Payer: BC Managed Care – PPO | Admitting: Physical Therapy

## 2023-05-07 DIAGNOSIS — M6281 Muscle weakness (generalized): Secondary | ICD-10-CM

## 2023-05-07 DIAGNOSIS — R29898 Other symptoms and signs involving the musculoskeletal system: Secondary | ICD-10-CM | POA: Diagnosis not present

## 2023-05-07 NOTE — Therapy (Signed)
 OUTPATIENT PHYSICAL THERAPY THORACOLUMBAR TREATMENT    Patient Name: Chelsea [name] MRN: [id number] DOB:08-22-1985, 38 y.o., female Today's Date: 05/07/2023  END OF SESSION:  PT End of Session - 05/07/23 1524     Visit Number 5    Number of Visits 16    Date for PT Re-Evaluation 06/18/23    Authorization Type BCBS    Authorization Time Period 13 visits approved 04/22/23-07/22/23    Authorization - Visit Number 5    Authorization - Number of Visits 13    Progress Note Due on Visit 10    PT Start Time 1445    PT Stop Time 1525    PT Time Calculation (min) 40 min    Activity Tolerance Patient tolerated treatment well    Behavior During Therapy WFL for tasks assessed/performed             Past Medical History:  Diagnosis Date   Depression    Endometrial polyp    Fibromyalgia    History of recurrent UTIs    Hypertension    Infertility, anovulation    Myofascial muscle pain    OA (osteoarthritis) of knee    BILATERAL   PCOS (polycystic ovarian syndrome)    PONV (postoperative nausea and vomiting)    Seasonal allergic rhinitis    Past Surgical History:  Procedure Laterality Date   CHOLECYSTECTOMY N/A 07/11/2021   Procedure: LAPAROSCOPIC CHOLECYSTECTOMY WITH INTRAOPERATIVE CHOLANGIOGRAM;  Surgeon: Manus Rudd, MD;  Location: [facility];  Service: General;  Laterality: N/A;   HYSTEROSCOPY WITH D & C N/A 09/07/2014   Procedure:  DILATATION AND CURETTAGE /HYSTEROSCOPY/POLYPECTOMY;  Surgeon: Fermin Schwab, MD;  Location:  [place];  Service: Gynecology;  Laterality: N/A;   HYSTEROSCOPY WITH D & C N/A 07/25/2021   Procedure: DILATATION AND CURETTAGE /HYSTEROSCOPY;  Surgeon: Jerene Bears, MD;  Location: [facility] ;  Service: Gynecology;  Laterality: N/A;   I & D RIGHT FOOT  1998   LAPAROSCOPIC GASTRIC SLEEVE RESECTION N/A 10/20/2017   Procedure: LAPAROSCOPIC GASTRIC SLEEVE RESECTION, UPPER ENDO, ERAS Pathway;  Surgeon: Berna Bue, MD;   Location: [facility];  Service: General;  Laterality: N/A;   POLYPECTOMY     uterine   TONSILLECTOMY  2001   WISDOM TOOTH EXTRACTION  2004   Patient Active Problem List   Diagnosis Date Noted   Left cervical radiculopathy 07/02/2022   Right carpal tunnel syndrome 04/01/2022   Left hand tendonitis 02/19/2022   Simple endometrial hyperplasia    Right ankle sprain 07/19/2020   Chronic pain syndrome 05/30/2020   Impingement syndrome of both shoulders 08/17/2019   Primary insomnia 05/11/2019   Mixed hypercholesterolemia and hypertriglyceridemia 11/13/2018   Orgasmic headache 11/11/2018   Diet-controlled diabetes mellitus (HCC) 04/25/2017   Lumbar degenerative disc disease 08/24/2015   Intractable migraine without aura and with status migrainosus 06/24/2014   Myofascial pain syndrome with depression 02/08/2014   Venous stasis dermatitis 01/25/2014   Right hip pain 12/13/2013   Metatarsalgia of both feet 10/18/2013   Primary osteoarthritis of both knees 02/13/2012   Essential hypertension, benign 11/25/2008   POLYCYSTIC OVARIAN DISEASE 10/26/2008   History of gastric sleeve 10/26/2008    PCP: Benjamin Stain  REFERRING PROVIDER: Lisbeth Renshaw  REFERRING DIAG: herniated nucleus propulsus, lumbar  Rationale for Evaluation and Treatment: Rehabilitation  THERAPY DIAG:  Muscle weakness (generalized)  Other symptoms and signs involving the musculoskeletal system  ONSET DATE: 12/2022  SUBJECTIVE:  SUBJECTIVE STATEMENT: Patient reports her back is "sore". And that the pain "comes and goes"  EVAL: Pt is s/p L3-4 microdiscectomy 12/2022. Prior to surgery she was having severe pain down her Rt LE but that has resolved. She states that now she is having difficulty with bending forward, standing or walking  for prolonged periods. She states she is limited by hip,knee and foot pain. Pain starts in low back and travels down. She state she has less energy due to the chronic and prolonged pain.  PERTINENT HISTORY:  Fibromyalgia, chronic pain syndrome  PAIN:  Are you having pain? Yes: NPRS scale: 4/10 Pain location: generalized Pain description: sore, tight  Aggravating factors: bending, prolonged standing or walking Relieving factors: rest, meds  PRECAUTIONS: None  RED FLAGS: None   WEIGHT BEARING RESTRICTIONS: No  FALLS:  Has patient fallen in last 6 months? No   OCCUPATION: not working  PLOF: Independent  PATIENT GOALS: decrease pain, be able to stand and walk longer  NEXT MD VISIT: 04/2023  OBJECTIVE:  Note: Objective measures were completed at Evaluation unless otherwise noted.   PATIENT SURVEYS:  Modified Oswestry 24/50 : 48%   COGNITION: Overall cognitive status: Within functional limits for tasks assessed     SENSATION:   MUSCLE LENGTH: Hamstrings: Right 60 deg; Left 40 deg   PALPATION: Increased mm spasticity lumbar paraspinals Multiple trigger points throughout postural musculature - pt states this is common due to fibromyalgia  LUMBAR ROM:   AROM eval  Flexion 50% pain  Extension 50%  Right lateral flexion 50%  Left lateral flexion 50%  Right rotation WFL  Left rotation WFL   (Blank rows = not tested)   LOWER EXTREMITY MMT:    MMT Right eval Left eval  Hip flexion 3+ 3+  Hip extension 3+ 3+  Hip abduction 4- 4-  Hip adduction    Hip internal rotation    Hip external rotation    Knee flexion    Knee extension    Ankle dorsiflexion    Ankle plantarflexion    Ankle inversion    Ankle eversion     (Blank rows = not tested)   FUNCTIONAL TESTS:  5 times sit to stand: 20.94 seconds 3 minute walk test: 100 meters   [facility] Adult PT Treatment:                                                DATE: 05/07/23 Therapeutic  Exercise/Activity: Supine core marching Supine hip add ball squeeze 10 x 5 sec hold Hooklying clam 2 x 10 blue TB LTR x 1 min Green TB pull down with march Tandem stance with green TB row 2 x 10 Resisted walking 10# fwd/bkwd/laterally all x 10 Sit <> stand 10#KB x 10 Suitcase carry 10#KB x 2 laps   [facility] Adult PT Treatment:                                                DATE: 05/05/2023 Therapeutic Exercise: Self-massage glute with 4" ball Supine:  Sciatic nerve glide Core marching Hooklying hip add iso (ball squeeze) 10x5" Hooklying clamshell + blue TB 2x10 Seated hip hinge trunk flexion (flat back) Standing glute med wall press with ball (  B) Manual Therapy: IASTM lateral quad/ITB, glutes Therapeutic Activity: Walking for endurance 80'x3 Side stepping Side marching   [facility] Adult PT Treatment:                                                DATE: 04/30/23 Therapeutic Exercise/Activity/NMR: LTR x 1 min Figure 4 stretch x 30 sec bilat SKTC 5 x 10 sec bilat Supine posterior pelvic tilt 2 x 10 Resisted walking 10# fwd/bkwd/laterally all x 10 Hip abd red TB 2 x 10 Hip ext red TB 2 x 10 Open book x 10 each  Walking x 5 minutes                                                                                                                   PATIENT EDUCATION:  Education details: HEP; see treatment  Person educated: Patient Education method: Explanation, Demonstration, and Handouts Education comprehension: verbalized understanding and returned demonstration  HOME EXERCISE PROGRAM: Access Code: [email or web address] URL: [email or web address] Date: 04/28/2023 Prepared by: Letitia Libra  Program Notes walking 3 minutes at a time, 4-5 times a day  Exercises - Sit to Stand Without Arm Support  - 1 x daily - 7 x weekly - 3 sets - 10 reps - Heel Raises with Counter Support  - 1 x daily - 7 x weekly - 3 sets - 10 reps - Hip Abduction with Resistance Loop  - 1 x daily - 7 x  weekly - 3 sets - 10 reps - Hip Extension with Resistance Loop  - 1 x daily - 7 x weekly - 3 sets - 10 reps - Hooklying Single Knee to Chest Stretch with Towel  - 1 x daily - 7 x weekly - 1 sets - 10 reps - 5 sec  hold - Sidelying Thoracic Rotation with Open Book  - 1 x daily - 7 x weekly - 1 sets - 10 reps  Patient Education - Network engineer - Housekeeping  ASSESSMENT:  CLINICAL IMPRESSION: Pt has improved ROM and has no pain with forward trunk flexion. She continues to progress well with activity tolerance and core and LE strengthening. She continues with muscle soreness after each session but has decreased back pain overall.  EVAL: Patient is a 38 y.o. female who was seen today for physical therapy evaluation and treatment for herniated disc s/p microdiscectomy. She presents with decreased strength and activity tolerance, increased pain and decreased functional mobility. Pt will benefit from skilled PT to address deficits and improve functional mobility.     GOALS: Goals reviewed with patient? Yes  SHORT TERM GOALS: Target date: 05/21/2023  Pt will be independent with initial HEP Baseline: Goal status: INITIAL  2.  Pt will demo improved LE strength with 5 x STS <= 10 seconds Baseline:  Goal status: INITIAL    LONG TERM GOALS: Target date: 06/18/2023  Pt will  be independent in advanced HEP Baseline:  Goal status: INITIAL  2.  Pt will report reaching to bottom shelf or cabinet with pain <= 5/10 Baseline:  Goal status: INITIAL  3.  Pt will improve 3 minute walk to >= 200 meters Baseline:  Goal status: INITIAL  4.  Pt will tolerate standing x 10 minutes with pain <= 6/10 Baseline:  Goal status: INITIAL    PLAN:  PT FREQUENCY: 1-2x/week  PT DURATION: 8 weeks  PLANNED INTERVENTIONS: 97164- PT Re-evaluation, 97110-Therapeutic exercises, 97530- Therapeutic activity, 97112- Neuromuscular re-education, 97535- Self Care, 16109- Manual therapy,  [id number]- Aquatic Therapy, Patient/Family education, Balance training, Cryotherapy, and Moist heat.  PLAN FOR NEXT SESSION: update HEP as needed, functional strength and endurance   Reggy Eye, PT,DPT03/12/253:25 PM

## 2023-05-08 DIAGNOSIS — N856 Intrauterine synechiae: Secondary | ICD-10-CM | POA: Diagnosis not present

## 2023-05-08 DIAGNOSIS — N840 Polyp of corpus uteri: Secondary | ICD-10-CM | POA: Diagnosis not present

## 2023-05-12 ENCOUNTER — Ambulatory Visit: Payer: BC Managed Care – PPO

## 2023-05-12 DIAGNOSIS — R29898 Other symptoms and signs involving the musculoskeletal system: Secondary | ICD-10-CM

## 2023-05-12 DIAGNOSIS — L409 Psoriasis, unspecified: Secondary | ICD-10-CM | POA: Diagnosis not present

## 2023-05-12 DIAGNOSIS — M1990 Unspecified osteoarthritis, unspecified site: Secondary | ICD-10-CM | POA: Diagnosis not present

## 2023-05-12 DIAGNOSIS — M797 Fibromyalgia: Secondary | ICD-10-CM | POA: Diagnosis not present

## 2023-05-12 DIAGNOSIS — M6281 Muscle weakness (generalized): Secondary | ICD-10-CM | POA: Diagnosis not present

## 2023-05-12 DIAGNOSIS — M359 Systemic involvement of connective tissue, unspecified: Secondary | ICD-10-CM | POA: Diagnosis not present

## 2023-05-12 NOTE — Therapy (Signed)
 OUTPATIENT PHYSICAL THERAPY THORACOLUMBAR TREATMENT    Patient Name: Chelsea [name] MRN: [id number] DOB:03-Jan-1986, 38 y.o., female Today's Date: 05/12/2023  END OF SESSION:  PT End of Session - 05/12/23 1317     Visit Number 6    Number of Visits 16    Date for PT Re-Evaluation 06/18/23    Authorization Type BCBS    Authorization Time Period 13 visits approved 04/22/23-07/22/23    Authorization - Visit Number 6    Authorization - Number of Visits 13    Progress Note Due on Visit 10    PT Start Time 1316    PT Stop Time 1359    PT Time Calculation (min) 43 min    Activity Tolerance Patient tolerated treatment well    Behavior During Therapy WFL for tasks assessed/performed             Past Medical History:  Diagnosis Date   Depression    Endometrial polyp    Fibromyalgia    History of recurrent UTIs    Hypertension    Infertility, anovulation    Myofascial muscle pain    OA (osteoarthritis) of knee    BILATERAL   PCOS (polycystic ovarian syndrome)    PONV (postoperative nausea and vomiting)    Seasonal allergic rhinitis    Past Surgical History:  Procedure Laterality Date   CHOLECYSTECTOMY N/A 07/11/2021   Procedure: LAPAROSCOPIC CHOLECYSTECTOMY WITH INTRAOPERATIVE CHOLANGIOGRAM;  Surgeon: Manus Rudd, MD;  Location: [facility];  Service: General;  Laterality: N/A;   HYSTEROSCOPY WITH D & C N/A 09/07/2014   Procedure:  DILATATION AND CURETTAGE /HYSTEROSCOPY/POLYPECTOMY;  Surgeon: Fermin Schwab, MD;  Location: [facility];  Service: Gynecology;  Laterality: N/A;   HYSTEROSCOPY WITH D & C N/A 07/25/2021   Procedure: DILATATION AND CURETTAGE /HYSTEROSCOPY;  Surgeon: Jerene Bears, MD;  Location: [facility];  Service: Gynecology;  Laterality: N/A;   I & D RIGHT FOOT  1998   LAPAROSCOPIC GASTRIC SLEEVE RESECTION N/A 10/20/2017   Procedure: LAPAROSCOPIC GASTRIC SLEEVE RESECTION, UPPER ENDO, ERAS Pathway;  Surgeon: Berna Bue, MD;   Location: [facility];  Service: General;  Laterality: N/A;   POLYPECTOMY     uterine   TONSILLECTOMY  2001   WISDOM TOOTH EXTRACTION  2004   Patient Active Problem List   Diagnosis Date Noted   Left cervical radiculopathy 07/02/2022   Right carpal tunnel syndrome 04/01/2022   Left hand tendonitis 02/19/2022   Simple endometrial hyperplasia    Right ankle sprain 07/19/2020   Chronic pain syndrome 05/30/2020   Impingement syndrome of both shoulders 08/17/2019   Primary insomnia 05/11/2019   Mixed hypercholesterolemia and hypertriglyceridemia 11/13/2018   Orgasmic headache 11/11/2018   Diet-controlled diabetes mellitus (HCC) 04/25/2017   Lumbar degenerative disc disease 08/24/2015   Intractable migraine without aura and with status migrainosus 06/24/2014   Myofascial pain syndrome with depression 02/08/2014   Venous stasis dermatitis 01/25/2014   Right hip pain 12/13/2013   Metatarsalgia of both feet 10/18/2013   Primary osteoarthritis of both knees 02/13/2012   Essential hypertension, benign 11/25/2008   POLYCYSTIC OVARIAN DISEASE 10/26/2008   History of gastric sleeve 10/26/2008    PCP: Benjamin Stain  REFERRING PROVIDER: Lisbeth Renshaw  REFERRING DIAG: herniated nucleus propulsus, lumbar  Rationale for Evaluation and Treatment: Rehabilitation  THERAPY DIAG:  Muscle weakness (generalized)  Other symptoms and signs involving the musculoskeletal system  ONSET DATE: 12/2022  SUBJECTIVE:  SUBJECTIVE STATEMENT: Patient reports she is less sore with exercises; states she gets a "tweak" off to the right side of low back with "certain" movements, states pain stays localized with no radiating down leg.   EVAL: Pt is s/p L3-4 microdiscectomy 12/2022. Prior to surgery she was having severe pain  down her Rt LE but that has resolved. She states that now she is having difficulty with bending forward, standing or walking for prolonged periods. She states she is limited by hip,knee and foot pain. Pain starts in low back and travels down. She state she has less energy due to the chronic and prolonged pain.  PERTINENT HISTORY:  Fibromyalgia, chronic pain syndrome  PAIN:  Are you having pain? Yes: NPRS scale: 4/10 Pain location: generalized Pain description: sore, tight  Aggravating factors: bending, prolonged standing or walking Relieving factors: rest, meds  PRECAUTIONS: None  RED FLAGS: None   WEIGHT BEARING RESTRICTIONS: No  FALLS:  Has patient fallen in last 6 months? No   OCCUPATION: not working  PLOF: Independent  PATIENT GOALS: decrease pain, be able to stand and walk longer  NEXT MD VISIT: 04/2023  OBJECTIVE:  Note: Objective measures were completed at Evaluation unless otherwise noted.   PATIENT SURVEYS:  Modified Oswestry 24/50 : 48%   COGNITION: Overall cognitive status: Within functional limits for tasks assessed     SENSATION:   MUSCLE LENGTH: Hamstrings: Right 60 deg; Left 40 deg   PALPATION: Increased mm spasticity lumbar paraspinals Multiple trigger points throughout postural musculature - pt states this is common due to fibromyalgia  LUMBAR ROM:   AROM eval  Flexion 50% pain  Extension 50%  Right lateral flexion 50%  Left lateral flexion 50%  Right rotation WFL  Left rotation WFL   (Blank rows = not tested)   LOWER EXTREMITY MMT:    MMT Right eval Left eval  Hip flexion 3+ 3+  Hip extension 3+ 3+  Hip abduction 4- 4-  Hip adduction    Hip internal rotation    Hip external rotation    Knee flexion    Knee extension    Ankle dorsiflexion    Ankle plantarflexion    Ankle inversion    Ankle eversion     (Blank rows = not tested)   FUNCTIONAL TESTS:  5 times sit to stand: 20.94 seconds 3 minute walk test: 100  meters   [facility] Adult PT Treatment:                                                DATE: 05/12/2023 Therapeutic Exercise: Supine:  Core marching (R) sciatic nerve glide (discontinued d/t cramping R thigh) Hooklying hip add iso (ball squeeze) 10x5" Hooklying clamshell + black TB x15 (B) Bridges + black TB abd iso x10 Prone (pillow underneath stomach) (R) quad stretch Standing glute med wall press with ball (B) Side Lying: Open books x10 (B) Clamshells + blue TB 2x10 (B) Therapeutic Activity: Seated hip hinge trunk flexion (flat back)  Standing hip hinge --> added reach across midline Fwd & lateral marching     [facility] Adult PT Treatment:  DATE: 05/07/23 Therapeutic Exercise/Activity: Supine core marching Supine hip add ball squeeze 10 x 5 sec hold Hooklying clam 2 x 10 blue TB LTR x 1 min Green TB pull down with march Tandem stance with green TB row 2 x 10 Resisted walking 10# fwd/bkwd/laterally all x 10 Sit <> stand 10#KB x 10 Suitcase carry 10#KB x 2 laps   [facility] Adult PT Treatment:                                                DATE: 05/05/2023 Therapeutic Exercise: Self-massage glute with 4" ball Supine:  Sciatic nerve glide Core marching Hooklying hip add iso (ball squeeze) 10x5" Hooklying clamshell + blue TB 2x10 Seated hip hinge trunk flexion (flat back) Standing glute med wall press with ball (B) Manual Therapy: IASTM lateral quad/ITB, glutes Therapeutic Activity: Walking for endurance 80'x3 Side stepping Side marching                                                                                                                PATIENT EDUCATION:  Education details: HEP; see treatment  Person educated: Patient Education method: Explanation, Demonstration, and Handouts Education comprehension: verbalized understanding and returned demonstration  HOME EXERCISE PROGRAM: Access Code: [email or web address] URL:  [email or web address] Date: 05/12/2023 Prepared by: Carlynn Herald  Program Notes walking 3 minutes at a time, 4-5 times a day  Exercises - Sit to Stand Without Arm Support  - 1 x daily - 7 x weekly - 3 sets - 10 reps - Heel Raises with Counter Support  - 1 x daily - 7 x weekly - 3 sets - 10 reps - Hip Abduction with Resistance Loop  - 1 x daily - 7 x weekly - 3 sets - 10 reps - Hip Extension with Resistance Loop  - 1 x daily - 7 x weekly - 3 sets - 10 reps - Hooklying Single Knee to Chest Stretch with Towel  - 1 x daily - 7 x weekly - 1 sets - 10 reps - 5 sec  hold - Sidelying Thoracic Rotation with Open Book  - 1 x daily - 7 x weekly - 1 sets - 10 reps - Clam with Resistance  - 1 x daily - 7 x weekly - 3 sets - 10 reps - Bridge with Resistance  - 1 x daily - 7 x weekly - 3 sets - 10 reps - Standing Hip Hinge with Dowel  - 1 x daily - 7 x weekly - 3 sets - 10 reps - Walking March  - 1 x daily - 7 x weekly - 3 sets - 10 reps  Patient Education - Network engineer - Housekeeping  ASSESSMENT:  CLINICAL IMPRESSION: Hip hinge mechanics continued with progression to standing and reaching across midline; tactile cueing provided with dowel to improve postural awareness. Functional LE strengthening continued with multi-directional marching; noted LE fatigue  with activity. HEP updated with glute and standing exercises.   EVAL: Patient is a 38 y.o. female who was seen today for physical therapy evaluation and treatment for herniated disc s/p microdiscectomy. She presents with decreased strength and activity tolerance, increased pain and decreased functional mobility. Pt will benefit from skilled PT to address deficits and improve functional mobility.     GOALS: Goals reviewed with patient? Yes  SHORT TERM GOALS: Target date: 05/21/2023  Pt will be independent with initial HEP Baseline: Goal status: INITIAL  2.  Pt will demo improved LE strength with 5 x STS  <= 10 seconds Baseline:  Goal status: INITIAL    LONG TERM GOALS: Target date: 06/18/2023  Pt will be independent in advanced HEP Baseline:  Goal status: INITIAL  2.  Pt will report reaching to bottom shelf or cabinet with pain <= 5/10 Baseline:  Goal status: INITIAL  3.  Pt will improve 3 minute walk to >= 200 meters Baseline:  Goal status: INITIAL  4.  Pt will tolerate standing x 10 minutes with pain <= 6/10 Baseline:  Goal status: INITIAL    PLAN:  PT FREQUENCY: 1-2x/week  PT DURATION: 8 weeks  PLANNED INTERVENTIONS: 97164- PT Re-evaluation, 97110-Therapeutic exercises, 97530- Therapeutic activity, 97112- Neuromuscular re-education, 97535- Self Care, 95621- Manual therapy, [id number]- Aquatic Therapy, Patient/Family education, Balance training, Cryotherapy, and Moist heat.  PLAN FOR NEXT SESSION: Progress functional strength and endurance   Carlynn Herald, PTA 05/12/23 2:00 PM

## 2023-05-14 ENCOUNTER — Ambulatory Visit: Payer: BC Managed Care – PPO | Admitting: Physical Therapy

## 2023-05-14 ENCOUNTER — Encounter: Payer: Self-pay | Admitting: Physical Therapy

## 2023-05-14 DIAGNOSIS — R29898 Other symptoms and signs involving the musculoskeletal system: Secondary | ICD-10-CM

## 2023-05-14 DIAGNOSIS — M6281 Muscle weakness (generalized): Secondary | ICD-10-CM

## 2023-05-14 NOTE — Therapy (Signed)
 OUTPATIENT PHYSICAL THERAPY THORACOLUMBAR TREATMENT    Patient Name: Chelsea [name] MRN: [id number] DOB:Jul 22, 1985, 38 y.o., female Today's Date: 05/14/2023  END OF SESSION:  PT End of Session - 05/14/23 1509     Visit Number 7    Number of Visits 16    Date for PT Re-Evaluation 06/18/23    Authorization Type BCBS    Authorization Time Period 13 visits approved 04/22/23-07/22/23    Authorization - Visit Number 7    Authorization - Number of Visits 13    Progress Note Due on Visit 10    PT Start Time 1430    PT Stop Time 1510    PT Time Calculation (min) 40 min    Activity Tolerance Patient tolerated treatment well    Behavior During Therapy WFL for tasks assessed/performed              Past Medical History:  Diagnosis Date   Depression    Endometrial polyp    Fibromyalgia    History of recurrent UTIs    Hypertension    Infertility, anovulation    Myofascial muscle pain    OA (osteoarthritis) of knee    BILATERAL   PCOS (polycystic ovarian syndrome)    PONV (postoperative nausea and vomiting)    Seasonal allergic rhinitis    Past Surgical History:  Procedure Laterality Date   CHOLECYSTECTOMY N/A 07/11/2021   Procedure: LAPAROSCOPIC CHOLECYSTECTOMY WITH INTRAOPERATIVE CHOLANGIOGRAM;  Surgeon: Manus Rudd, MD;  Location: [facility];  Service: General;  Laterality: N/A;   HYSTEROSCOPY WITH D & C N/A 09/07/2014   Procedure:  DILATATION AND CURETTAGE /HYSTEROSCOPY/POLYPECTOMY;  Surgeon: Fermin Schwab, MD;  Location: [facility];  Service: Gynecology;  Laterality: N/A;   HYSTEROSCOPY WITH D & C N/A 07/25/2021   Procedure: DILATATION AND CURETTAGE /HYSTEROSCOPY;  Surgeon: Jerene Bears, MD;  Location: [facility];  Service: Gynecology;  Laterality: N/A;   I & D RIGHT FOOT  1998   LAPAROSCOPIC GASTRIC SLEEVE RESECTION N/A 10/20/2017   Procedure: LAPAROSCOPIC GASTRIC SLEEVE RESECTION, UPPER ENDO, ERAS Pathway;  Surgeon: Berna Bue,  MD;  Location: [facility];  Service: General;  Laterality: N/A;   POLYPECTOMY     uterine   TONSILLECTOMY  2001   WISDOM TOOTH EXTRACTION  2004   Patient Active Problem List   Diagnosis Date Noted   Left cervical radiculopathy 07/02/2022   Right carpal tunnel syndrome 04/01/2022   Left hand tendonitis 02/19/2022   Simple endometrial hyperplasia    Right ankle sprain 07/19/2020   Chronic pain syndrome 05/30/2020   Impingement syndrome of both shoulders 08/17/2019   Primary insomnia 05/11/2019   Mixed hypercholesterolemia and hypertriglyceridemia 11/13/2018   Orgasmic headache 11/11/2018   Diet-controlled diabetes mellitus (HCC) 04/25/2017   Lumbar degenerative disc disease 08/24/2015   Intractable migraine without aura and with status migrainosus 06/24/2014   Myofascial pain syndrome with depression 02/08/2014   Venous stasis dermatitis 01/25/2014   Right hip pain 12/13/2013   Metatarsalgia of both feet 10/18/2013   Primary osteoarthritis of both knees 02/13/2012   Essential hypertension, benign 11/25/2008   POLYCYSTIC OVARIAN DISEASE 10/26/2008   History of gastric sleeve 10/26/2008    PCP: Benjamin Stain  REFERRING PROVIDER: Lisbeth Renshaw  REFERRING DIAG: herniated nucleus propulsus, lumbar  Rationale for Evaluation and Treatment: Rehabilitation  THERAPY DIAG:  Muscle weakness (generalized)  Other symptoms and signs involving the musculoskeletal system  ONSET DATE: 12/2022  SUBJECTIVE:  SUBJECTIVE STATEMENT: Patient reports she has some abdominal cramping for procedure last week. She states she is "sore" in her back but overall is ok  EVAL: Pt is s/p L3-4 microdiscectomy 12/2022. Prior to surgery she was having severe pain down her Rt LE but that has resolved. She states that now she  is having difficulty with bending forward, standing or walking for prolonged periods. She states she is limited by hip,knee and foot pain. Pain starts in low back and travels down. She state she has less energy due to the chronic and prolonged pain.  PERTINENT HISTORY:  Fibromyalgia, chronic pain syndrome  PAIN:  Are you having pain? Yes: NPRS scale: 4/10 Pain location: generalized Pain description: sore, tight  Aggravating factors: bending, prolonged standing or walking Relieving factors: rest, meds  PRECAUTIONS: None  RED FLAGS: None   WEIGHT BEARING RESTRICTIONS: No  FALLS:  Has patient fallen in last 6 months? No   OCCUPATION: not working  PLOF: Independent  PATIENT GOALS: decrease pain, be able to stand and walk longer  NEXT MD VISIT: 04/2023  OBJECTIVE:  Note: Objective measures were completed at Evaluation unless otherwise noted.   PATIENT SURVEYS:  Modified Oswestry 24/50 : 48%   COGNITION: Overall cognitive status: Within functional limits for tasks assessed     SENSATION:   MUSCLE LENGTH: Hamstrings: Right 60 deg; Left 40 deg   PALPATION: Increased mm spasticity lumbar paraspinals Multiple trigger points throughout postural musculature - pt states this is common due to fibromyalgia  LUMBAR ROM:   AROM eval  Flexion 50% pain  Extension 50%  Right lateral flexion 50%  Left lateral flexion 50%  Right rotation WFL  Left rotation WFL   (Blank rows = not tested)   LOWER EXTREMITY MMT:    MMT Right eval Left eval  Hip flexion 3+ 3+  Hip extension 3+ 3+  Hip abduction 4- 4-  Hip adduction    Hip internal rotation    Hip external rotation    Knee flexion    Knee extension    Ankle dorsiflexion    Ankle plantarflexion    Ankle inversion    Ankle eversion     (Blank rows = not tested)   FUNCTIONAL TESTS:  Eval: 5 times sit to stand: 20.94 seconds 3 minute walk test: 100 meters 5x STS 05/14/23: 14.37 seconds 3 minute walk test  05/14/23: 166 meters  [facility] Adult PT Treatment:                                                DATE: 05/14/23 Therapeutic Exercise/Activity: Forward and lateral marching Standing hip hinge - focus on flat back 5 x STS: 14.37 seconds Supine:  Core marching Hooklying hip add iso (ball squeeze) 10x5" Hooklying clamshell + black TB x15 (B) Bridges + black TB abd iso x15 Sidelying clam blue TB x 10 Sidelying open book x 10 3 minute walk test: 166 meters   [facility] Adult PT Treatment:                                                DATE: 05/12/2023 Therapeutic Exercise: Supine:  Core marching (R) sciatic nerve glide (discontinued d/t cramping R thigh) Hooklying hip add  iso (ball squeeze) 10x5" Hooklying clamshell + black TB x15 (B) Bridges + black TB abd iso x10 Prone (pillow underneath stomach) (R) quad stretch Standing glute med wall press with ball (B) Side Lying: Open books x10 (B) Clamshells + blue TB 2x10 (B) Therapeutic Activity: Seated hip hinge trunk flexion (flat back)  Standing hip hinge --> added reach across midline Fwd & lateral marching     [facility] Adult PT Treatment:                                                DATE: 05/07/23 Therapeutic Exercise/Activity: Supine core marching Supine hip add ball squeeze 10 x 5 sec hold Hooklying clam 2 x 10 blue TB LTR x 1 min Green TB pull down with march Tandem stance with green TB row 2 x 10 Resisted walking 10# fwd/bkwd/laterally all x 10 Sit <> stand 10#KB x 10 Suitcase carry 10#KB x 2 laps                                                                       PATIENT EDUCATION:  Education details: HEP; see treatment  Person educated: Patient Education method: Explanation, Demonstration, and Handouts Education comprehension: verbalized understanding and returned demonstration  HOME EXERCISE PROGRAM: Access Code: [email or web address] URL: [email or web address] Date: 05/12/2023 Prepared by: Carlynn Herald  Program  Notes walking 3 minutes at a time, 4-5 times a day  Exercises - Sit to Stand Without Arm Support  - 1 x daily - 7 x weekly - 3 sets - 10 reps - Heel Raises with Counter Support  - 1 x daily - 7 x weekly - 3 sets - 10 reps - Hip Abduction with Resistance Loop  - 1 x daily - 7 x weekly - 3 sets - 10 reps - Hip Extension with Resistance Loop  - 1 x daily - 7 x weekly - 3 sets - 10 reps - Hooklying Single Knee to Chest Stretch with Towel  - 1 x daily - 7 x weekly - 1 sets - 10 reps - 5 sec  hold - Sidelying Thoracic Rotation with Open Book  - 1 x daily - 7 x weekly - 1 sets - 10 reps - Clam with Resistance  - 1 x daily - 7 x weekly - 3 sets - 10 reps - Bridge with Resistance  - 1 x daily - 7 x weekly - 3 sets - 10 reps - Standing Hip Hinge with Dowel  - 1 x daily - 7 x weekly - 3 sets - 10 reps - Walking March  - 1 x daily - 7 x weekly - 3 sets - 10 reps  Patient Education - Network engineer - Housekeeping  ASSESSMENT:  CLINICAL IMPRESSION: Pt has made great improvements in 3 minute walk and 5 x STS. She is progressing well towards goals and is improving strength and endurance. Plan to update HEP and d/c after next visit  EVAL: Patient is a 38 y.o. female who was seen today for physical therapy evaluation and treatment for herniated disc s/p microdiscectomy. She presents with decreased strength and  activity tolerance, increased pain and decreased functional mobility. Pt will benefit from skilled PT to address deficits and improve functional mobility.     GOALS: Goals reviewed with patient? Yes  SHORT TERM GOALS: Target date: 05/21/2023  Pt will be independent with initial HEP Baseline: Goal status: INITIAL  2.  Pt will demo improved LE strength with 5 x STS <= 10 seconds Baseline:  Goal status: IN PROGRESS - 14.37 seconds on 05/14/23    LONG TERM GOALS: Target date: 06/18/2023  Pt will be independent in advanced HEP Baseline:  Goal status: INITIAL  2.  Pt  will report reaching to bottom shelf or cabinet with pain <= 5/10 Baseline:  Goal status: INITIAL  3.  Pt will improve 3 minute walk to >= 200 meters Baseline:  Goal status: IN PROGRESS  4.  Pt will tolerate standing x 10 minutes with pain <= 6/10 Baseline:  Goal status: INITIAL    PLAN:  PT FREQUENCY: 1-2x/week  PT DURATION: 8 weeks  PLANNED INTERVENTIONS: 97164- PT Re-evaluation, 97110-Therapeutic exercises, 97530- Therapeutic activity, 97112- Neuromuscular re-education, 97535- Self Care, [id number]- Manual therapy, [id number]- Aquatic Therapy, Patient/Family education, Balance training, Cryotherapy, and Moist heat.  PLAN FOR NEXT SESSION: UPDATE HEP - D/C Progress functional strength and endurance   Reggy Eye, PT,DPT03/19/253:10 PM

## 2023-05-16 ENCOUNTER — Encounter (HOSPITAL_COMMUNITY): Payer: Self-pay | Admitting: *Deleted

## 2023-05-19 DIAGNOSIS — N856 Intrauterine synechiae: Secondary | ICD-10-CM | POA: Diagnosis not present

## 2023-05-21 ENCOUNTER — Ambulatory Visit: Payer: BC Managed Care – PPO | Admitting: Physical Therapy

## 2023-05-21 ENCOUNTER — Encounter: Payer: Self-pay | Admitting: Physical Therapy

## 2023-05-21 DIAGNOSIS — R29898 Other symptoms and signs involving the musculoskeletal system: Secondary | ICD-10-CM

## 2023-05-21 DIAGNOSIS — M6281 Muscle weakness (generalized): Secondary | ICD-10-CM | POA: Diagnosis not present

## 2023-05-21 NOTE — Therapy (Addendum)
 OUTPATIENT PHYSICAL THERAPY THORACOLUMBAR TREATMENT AND DISCHARGE   Patient Name: Chelsea [name] MRN: [id number] DOB:1985/10/26, 38 y.o., female Today's Date: 05/21/2023  END OF SESSION:  PT End of Session - 05/21/23 1514     Visit Number 8    Number of Visits 16    Date for PT Re-Evaluation 06/18/23    Authorization Type BCBS    Authorization Time Period 13 visits approved 04/22/23-07/22/23    Authorization - Visit Number 8    Authorization - Number of Visits 13    Progress Note Due on Visit 10    PT Start Time 1435    PT Stop Time 1515    PT Time Calculation (min) 40 min    Activity Tolerance Patient tolerated treatment well    Behavior During Therapy WFL for tasks assessed/performed               Past Medical History:  Diagnosis Date   Depression    Endometrial polyp    Fibromyalgia    History of recurrent UTIs    Hypertension    Infertility, anovulation    Myofascial muscle pain    OA (osteoarthritis) of knee    BILATERAL   PCOS (polycystic ovarian syndrome)    PONV (postoperative nausea and vomiting)    Seasonal allergic rhinitis    Past Surgical History:  Procedure Laterality Date   CHOLECYSTECTOMY N/A 07/11/2021   Procedure: LAPAROSCOPIC CHOLECYSTECTOMY WITH INTRAOPERATIVE CHOLANGIOGRAM;  Surgeon: Dareen Ebbing, MD;  Location: [facility];  Service: General;  Laterality: N/A;   HYSTEROSCOPY WITH D & C N/A 09/07/2014   Procedure:  DILATATION AND CURETTAGE /HYSTEROSCOPY/POLYPECTOMY;  Surgeon: Asa Bjork, MD;  Location: [facility];  Service: Gynecology;  Laterality: N/A;   HYSTEROSCOPY WITH D & C N/A 07/25/2021   Procedure: DILATATION AND CURETTAGE /HYSTEROSCOPY;  Surgeon: Lillian Rein, MD;  Location: [facility];  Service: Gynecology;  Laterality: N/A;   I & D RIGHT FOOT  1998   LAPAROSCOPIC GASTRIC SLEEVE RESECTION N/A 10/20/2017   Procedure: LAPAROSCOPIC GASTRIC SLEEVE RESECTION, UPPER ENDO, ERAS Pathway;  Surgeon:  Adalberto Acton, MD;  Location: [facility];  Service: General;  Laterality: N/A;   POLYPECTOMY     uterine   TONSILLECTOMY  2001   WISDOM TOOTH EXTRACTION  2004   Patient Active Problem List   Diagnosis Date Noted   Left cervical radiculopathy 07/02/2022   Right carpal tunnel syndrome 04/01/2022   Left hand tendonitis 02/19/2022   Simple endometrial hyperplasia    Right ankle sprain 07/19/2020   Chronic pain syndrome 05/30/2020   Impingement syndrome of both shoulders 08/17/2019   Primary insomnia 05/11/2019   Mixed hypercholesterolemia and hypertriglyceridemia 11/13/2018   Orgasmic headache 11/11/2018   Diet-controlled diabetes mellitus (HCC) 04/25/2017   Lumbar degenerative disc disease 08/24/2015   Intractable migraine without aura and with status migrainosus 06/24/2014   Myofascial pain syndrome with depression 02/08/2014   Venous stasis dermatitis 01/25/2014   Right hip pain 12/13/2013   Metatarsalgia of both feet 10/18/2013   Primary osteoarthritis of both knees 02/13/2012   Essential hypertension, benign 11/25/2008   POLYCYSTIC OVARIAN DISEASE 10/26/2008   History of gastric sleeve 10/26/2008    PCP: Sandy Crumb  REFERRING PROVIDER: Augusto Blonder  REFERRING DIAG: herniated nucleus propulsus, lumbar  Rationale for Evaluation and Treatment: Rehabilitation  THERAPY DIAG:  Muscle weakness (generalized)  Other symptoms and signs involving the musculoskeletal system  ONSET DATE: 12/2022  SUBJECTIVE:  SUBJECTIVE STATEMENT: Patient reports she did a lot of standing and walking yesterday and her back is sore  EVAL: Pt is s/p L3-4 microdiscectomy 12/2022. Prior to surgery she was having severe pain down her Rt LE but that has resolved. She states that now she is having difficulty  with bending forward, standing or walking for prolonged periods. She states she is limited by hip,knee and foot pain. Pain starts in low back and travels down. She state she has less energy due to the chronic and prolonged pain.  PERTINENT HISTORY:  Fibromyalgia, chronic pain syndrome  PAIN:  Are you having pain? Yes: NPRS scale: 5-6/10 Pain location: generalized Pain description: sore, tight  Aggravating factors: bending, prolonged standing or walking Relieving factors: rest, meds  PRECAUTIONS: None  RED FLAGS: None   WEIGHT BEARING RESTRICTIONS: No  FALLS:  Has patient fallen in last 6 months? No   OCCUPATION: not working  PLOF: Independent  PATIENT GOALS: decrease pain, be able to stand and walk longer  NEXT MD VISIT: 04/2023  OBJECTIVE:  Note: Objective measures were completed at Evaluation unless otherwise noted.   PATIENT SURVEYS:  Modified Oswestry 24/50 : 48%   COGNITION: Overall cognitive status: Within functional limits for tasks assessed     SENSATION:   MUSCLE LENGTH: Hamstrings: Right 60 deg; Left 40 deg   PALPATION: Increased mm spasticity lumbar paraspinals Multiple trigger points throughout postural musculature - pt states this is common due to fibromyalgia  LUMBAR ROM:   AROM eval 05/21/23  Flexion 50% pain 75%  Extension 50% 50%  Right lateral flexion 50% 100%  Left lateral flexion 50% 100%  Right rotation WFL   Left rotation WFL    (Blank rows = not tested)   LOWER EXTREMITY MMT:    MMT Right eval Left eval Right/Left 05/21/23  Hip flexion 3+ 3+ 4/4  Hip extension 3+ 3+ 4-/4-  Hip abduction 4- 4- 4-/4  Hip adduction     Hip internal rotation     Hip external rotation     Knee flexion     Knee extension     Ankle dorsiflexion     Ankle plantarflexion     Ankle inversion     Ankle eversion      (Blank rows = not tested)   FUNCTIONAL TESTS:  Eval: 5 times sit to stand: 20.94 seconds 3 minute walk test: 100 meters 5x  STS 05/14/23: 14.37 seconds 3 minute walk test 05/14/23: 166 meters  [facility] Adult PT Treatment:                                                DATE: 05/21/23 Therapeutic Exercise/Activity: Supine:  Core marching Hooklying hip add iso (ball squeeze) 10x5" Hooklying clamshell + black TB x15 (B) Bridges + black TB abd iso x15 Sidelying clam blue TB x 10 Sidelying open book x 10 Forward and lateral marching Hip hinge focus on flat back ROM and strength measurements Education on progress towards goals and recommendations for d/c   [facility] Adult PT Treatment:                                                DATE: 05/14/23 Therapeutic Exercise/Activity: Forward and  lateral marching Standing hip hinge - focus on flat back 5 x STS: 14.37 seconds Supine:  Core marching Hooklying hip add iso (ball squeeze) 10x5" Hooklying clamshell + black TB x15 (B) Bridges + black TB abd iso x15 Sidelying clam blue TB x 10 Sidelying open book x 10 3 minute walk test: 166 meters   [facility] Adult PT Treatment:                                                DATE: 05/12/2023 Therapeutic Exercise: Supine:  Core marching (R) sciatic nerve glide (discontinued d/t cramping R thigh) Hooklying hip add iso (ball squeeze) 10x5" Hooklying clamshell + black TB x15 (B) Bridges + black TB abd iso x10 Prone (pillow underneath stomach) (R) quad stretch Standing glute med wall press with ball (B) Side Lying: Open books x10 (B) Clamshells + blue TB 2x10 (B) Therapeutic Activity: Seated hip hinge trunk flexion (flat back)  Standing hip hinge --> added reach across midline Fwd & lateral marching                                                                       PATIENT EDUCATION:  Education details: HEP; see treatment  Person educated: Patient Education method: Explanation, Demonstration, and Handouts Education comprehension: verbalized understanding and returned demonstration  HOME EXERCISE PROGRAM: Access Code:  [email or web address] URL: [email or web address] Date: 05/12/2023 Prepared by: Sims Duck  Program Notes walking 3 minutes at a time, 4-5 times a day  Exercises - Sit to Stand Without Arm Support  - 1 x daily - 7 x weekly - 3 sets - 10 reps - Heel Raises with Counter Support  - 1 x daily - 7 x weekly - 3 sets - 10 reps - Hip Abduction with Resistance Loop  - 1 x daily - 7 x weekly - 3 sets - 10 reps - Hip Extension with Resistance Loop  - 1 x daily - 7 x weekly - 3 sets - 10 reps - Hooklying Single Knee to Chest Stretch with Towel  - 1 x daily - 7 x weekly - 1 sets - 10 reps - 5 sec  hold - Sidelying Thoracic Rotation with Open Book  - 1 x daily - 7 x weekly - 1 sets - 10 reps - Clam with Resistance  - 1 x daily - 7 x weekly - 3 sets - 10 reps - Bridge with Resistance  - 1 x daily - 7 x weekly - 3 sets - 10 reps - Standing Hip Hinge with Dowel  - 1 x daily - 7 x weekly - 3 sets - 10 reps - Walking March  - 1 x daily - 7 x weekly - 3 sets - 10 reps  Patient Education - Network engineer - Housekeeping  ASSESSMENT:  CLINICAL IMPRESSION: Pt has improved strength and mobility. She is improving activity tolerance and functional mobility. Pt is independent with HEP. Plan to hold PT at this time as pt returns to neurosurgeon next week  EVAL: Patient is a 38 y.o. female who was seen today for physical therapy evaluation and  treatment for herniated disc s/p microdiscectomy. She presents with decreased strength and activity tolerance, increased pain and decreased functional mobility. Pt will benefit from skilled PT to address deficits and improve functional mobility.     GOALS: Goals reviewed with patient? Yes  SHORT TERM GOALS: Target date: 05/21/2023  Pt will be independent with initial HEP Baseline: Goal status: MET  2.  Pt will demo improved LE strength with 5 x STS <= 10 seconds Baseline:  Goal status: IN PROGRESS - 14.37 seconds on 05/14/23    LONG TERM  GOALS: Target date: 06/18/2023  Pt will be independent in advanced HEP Baseline:  Goal status: IN PROGRESS  2.  Pt will report reaching to bottom shelf or cabinet with pain <= 5/10 Baseline:  Goal status: MET 4/10 on 05/21/23  3.  Pt will improve 3 minute walk to >= 200 meters Baseline:  Goal status: IN PROGRESS  4.  Pt will tolerate standing x 10 minutes with pain <= 6/10 Baseline:  Goal status: MET    PLAN:  PT FREQUENCY: 1-2x/week  PT DURATION: 8 weeks  PLANNED INTERVENTIONS: 97164- PT Re-evaluation, 97110-Therapeutic exercises, 97530- Therapeutic activity, 97112- Neuromuscular re-education, 97535- Self Care, 82956- Manual therapy, [id number]- Aquatic Therapy, Patient/Family education, Balance training, Cryotherapy, and Moist heat.  PLAN FOR NEXT SESSION: HOLD PT   Lowery Rue, PT,DPT03/26/253:15 PM  PHYSICAL THERAPY DISCHARGE SUMMARY  Visits from Start of Care: 8  Current functional level related to goals / functional outcomes: Improved strength and activity tolerance, decreased pain   Remaining deficits: See above   Education / Equipment: HEP   Patient agrees to discharge. Patient goals were partially met. Patient is being discharged due to not returning since the last visit. Lowery Rue, PT,DPT04/25/259:07 AM

## 2023-05-23 DIAGNOSIS — K912 Postsurgical malabsorption, not elsewhere classified: Secondary | ICD-10-CM | POA: Diagnosis not present

## 2023-05-23 DIAGNOSIS — Z903 Acquired absence of stomach [part of]: Secondary | ICD-10-CM | POA: Diagnosis not present

## 2023-05-23 DIAGNOSIS — E569 Vitamin deficiency, unspecified: Secondary | ICD-10-CM | POA: Diagnosis not present

## 2023-05-23 DIAGNOSIS — Z1321 Encounter for screening for nutritional disorder: Secondary | ICD-10-CM | POA: Diagnosis not present

## 2023-05-26 DIAGNOSIS — M5126 Other intervertebral disc displacement, lumbar region: Secondary | ICD-10-CM | POA: Diagnosis not present

## 2023-05-29 DIAGNOSIS — N856 Intrauterine synechiae: Secondary | ICD-10-CM | POA: Diagnosis not present

## 2023-06-05 ENCOUNTER — Ambulatory Visit: Payer: Self-pay | Admitting: *Deleted

## 2023-06-18 DIAGNOSIS — Z3141 Encounter for fertility testing: Secondary | ICD-10-CM | POA: Diagnosis not present

## 2023-06-20 NOTE — Telephone Encounter (Signed)
 PA required for Trulicity rx. Thanks in advance.

## 2023-06-23 NOTE — Telephone Encounter (Signed)
 This request has been handled. No further action is required. Ozempic  and Mounjaro  was denied by the insurance.

## 2023-06-24 ENCOUNTER — Other Ambulatory Visit (HOSPITAL_COMMUNITY): Payer: Self-pay

## 2023-06-24 ENCOUNTER — Telehealth: Payer: Self-pay

## 2023-06-24 NOTE — Telephone Encounter (Signed)
 Pharmacy Patient Advocate Encounter   Received notification from Pt Calls Messages that prior authorization for Trulicity 0.75 is required/requested.   Insurance verification completed.   The patient is insured through [facility] .   Per test claim: PA required; PA submitted to above mentioned insurance via CoverMyMeds Key/confirmation #/EOC [id number] Status is pending

## 2023-06-25 ENCOUNTER — Other Ambulatory Visit (HOSPITAL_COMMUNITY): Payer: Self-pay

## 2023-06-25 NOTE — Telephone Encounter (Signed)
 This request has been handled. No further action is required. Please review other telephone encounters for additional information.

## 2023-06-25 NOTE — Telephone Encounter (Signed)
 Pharmacy Patient Advocate Encounter  Received notification from [facility] that Prior Authorization for Trulicity 0.75 has been APPROVED from 06/24/23 to 06/23/24. Unable to obtain price due to refill too soon rejection, last fill date 06/23/24 next available fill date 07/15/23   PA #/Case ID/Reference #: Chelsea [name]

## 2023-07-01 IMAGING — DX DG KNEE COMPLETE 4+V*R*
4 series · 4 of 4 positions shown · non-contrast
Comparison: None.

CLINICAL DATA: Chronic bilateral knee pain.

EXAM:
RIGHT KNEE - COMPLETE 4+ VIEW

[knee tunnel]
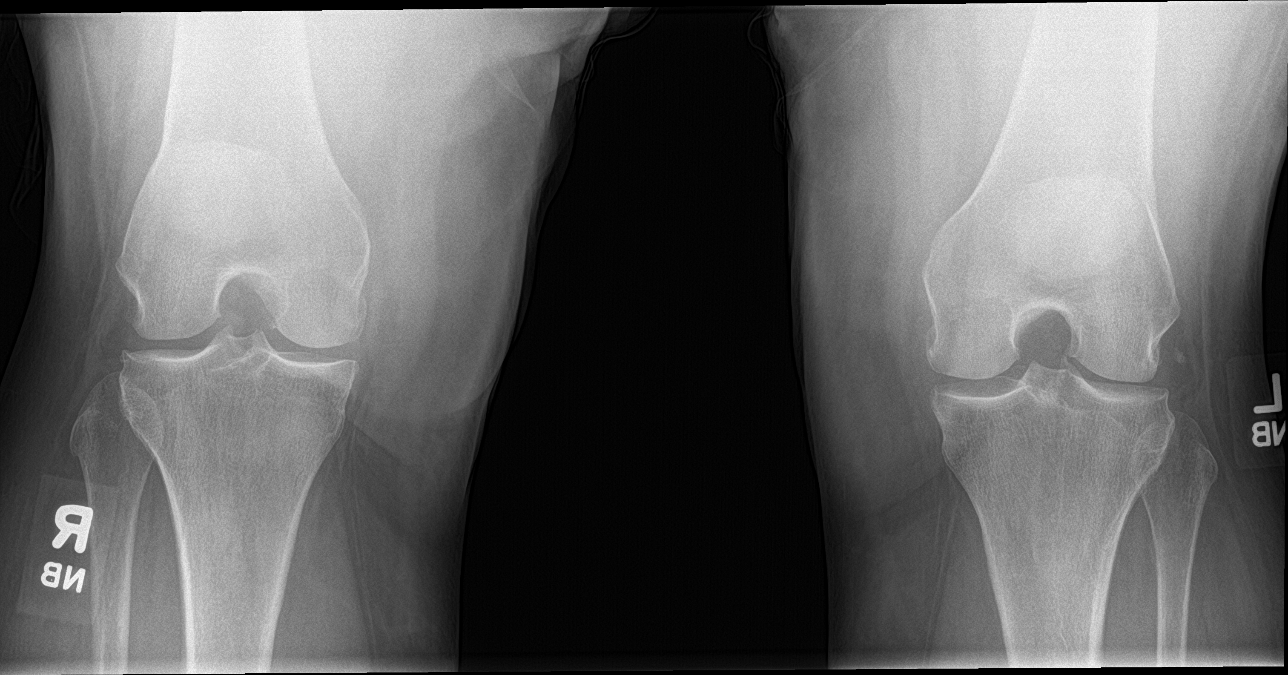

[knee lat]
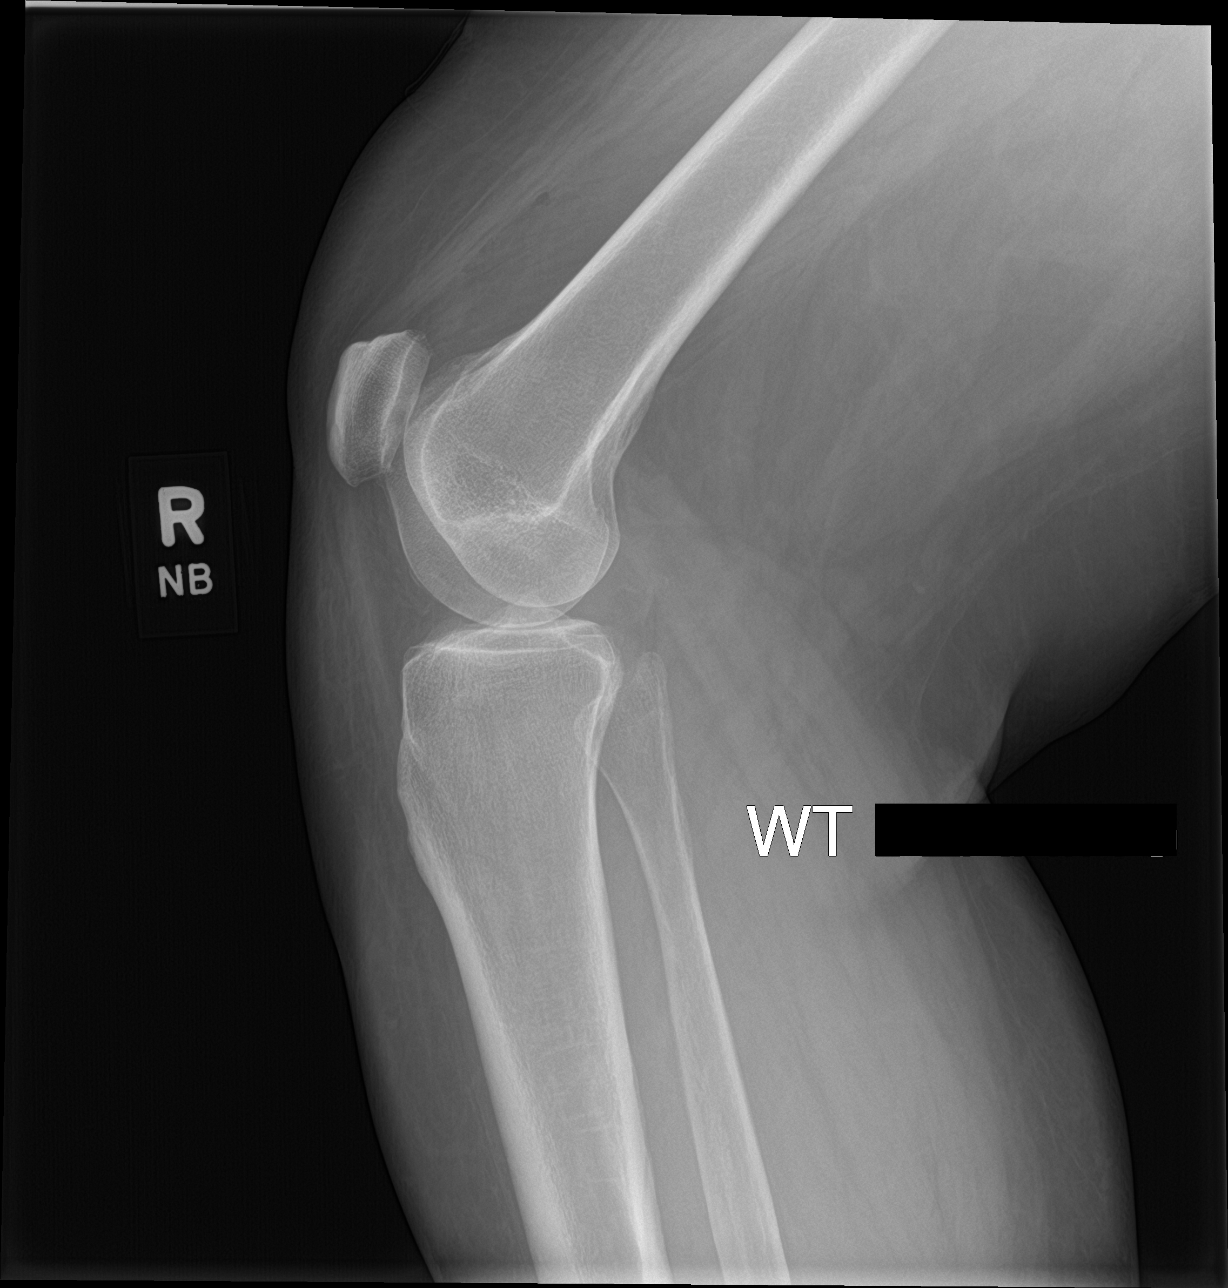

[knee sunrise]
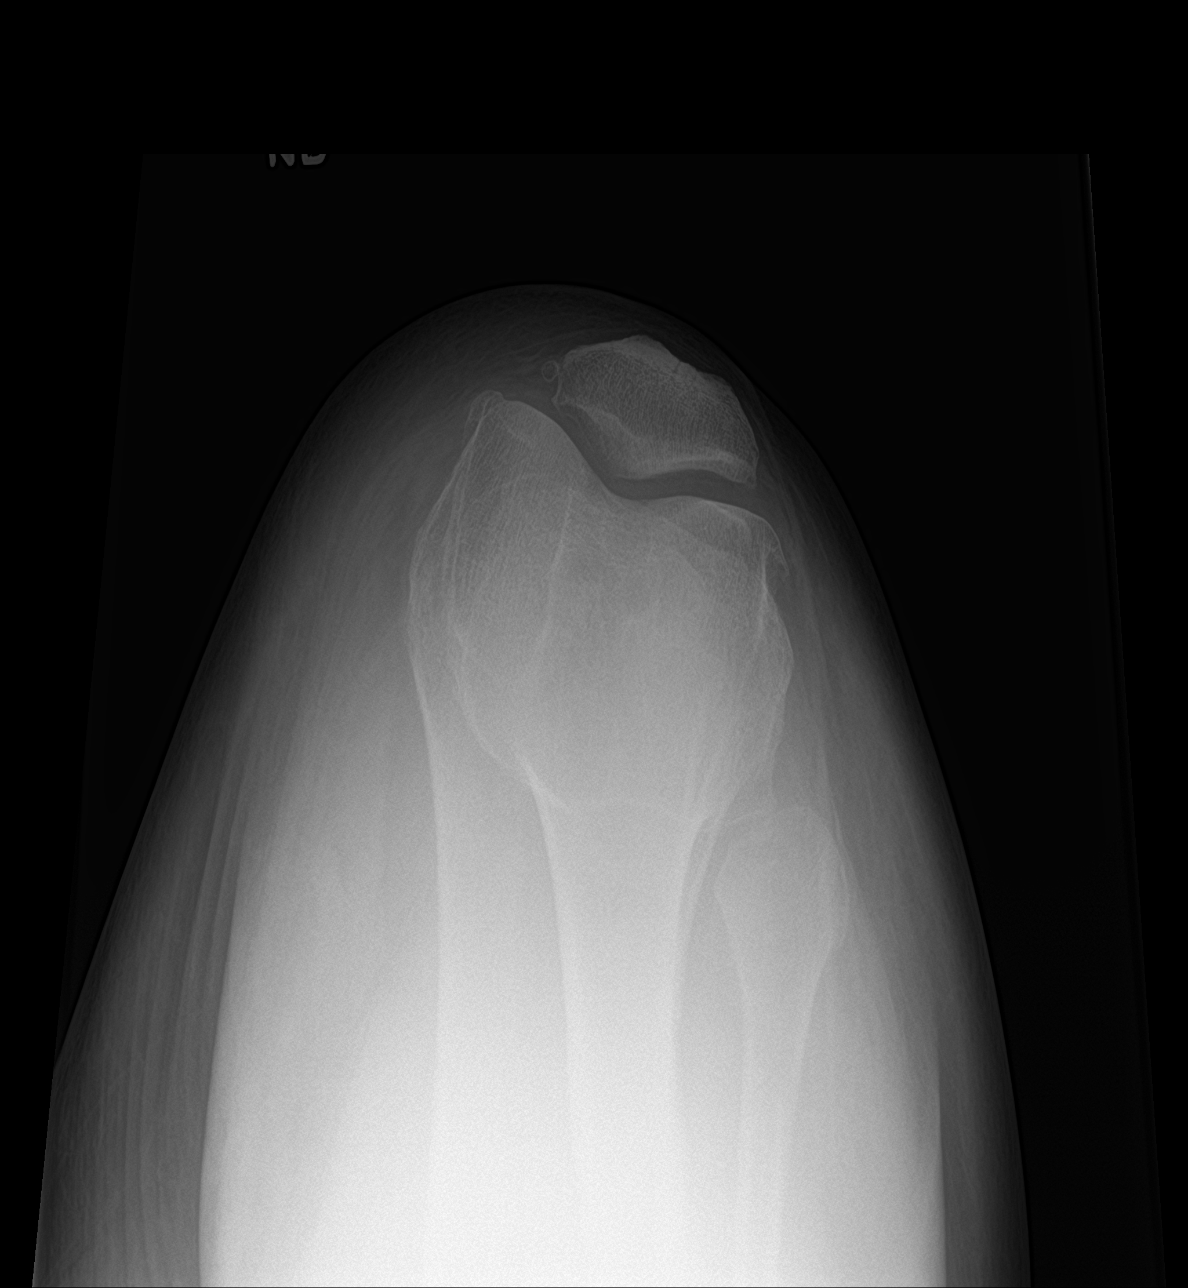

[knee ap bilat standing]
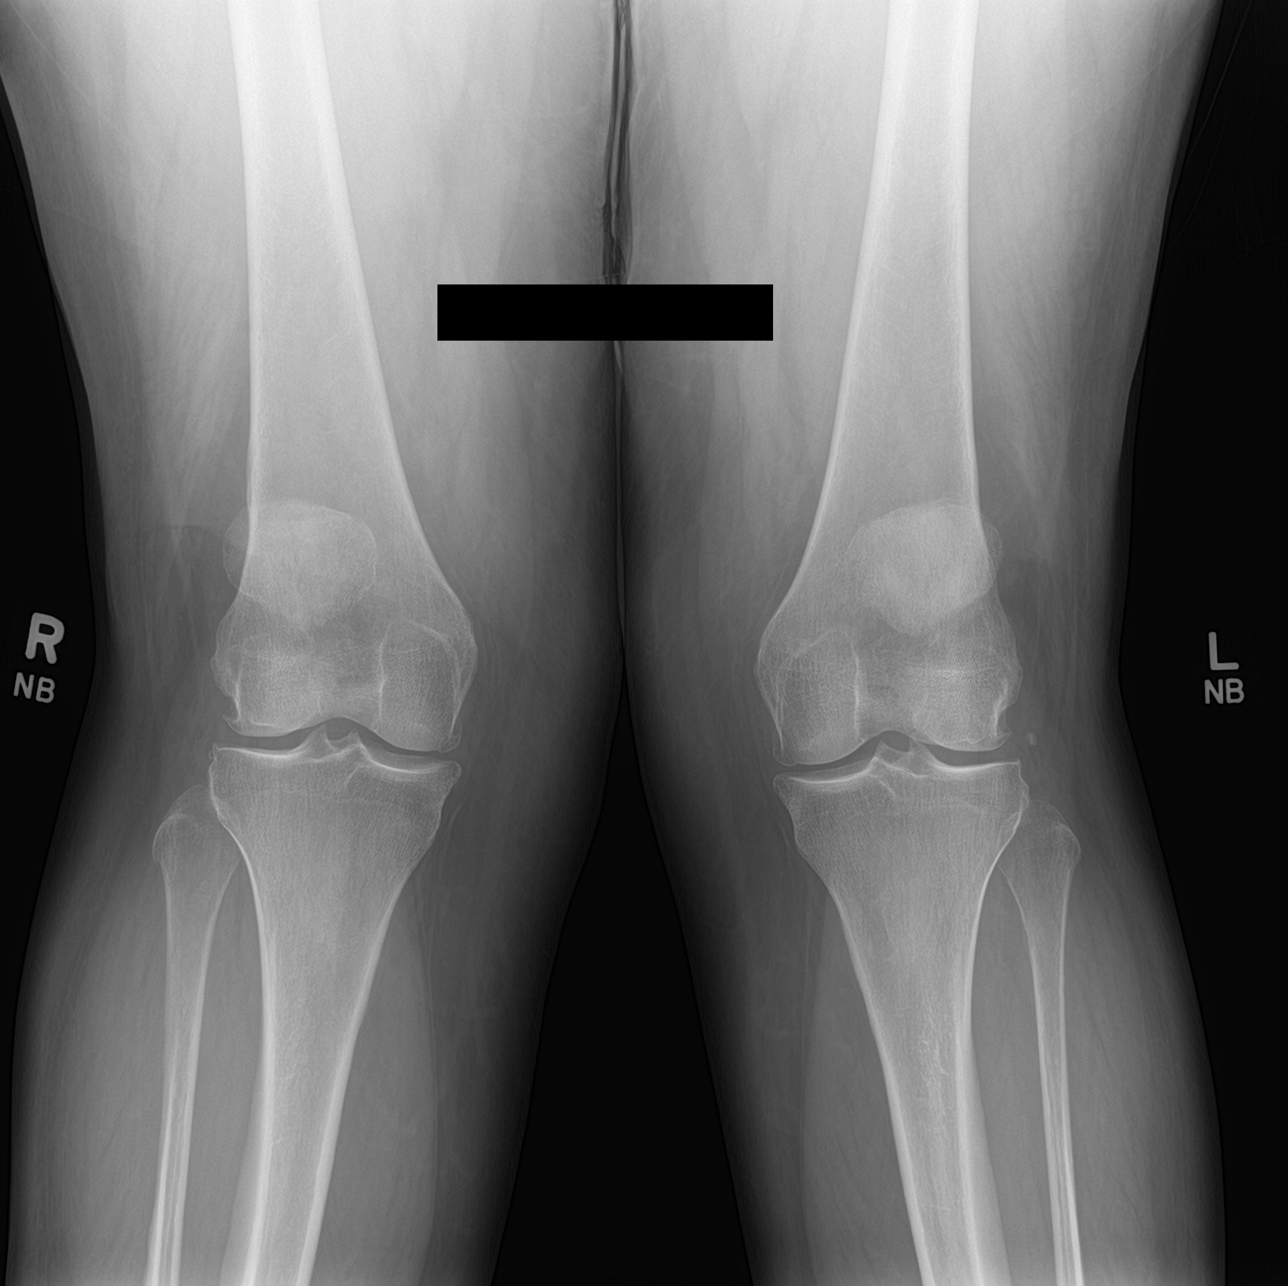

[4 of 4 positions shown; findings below may reference images not displayed]

FINDINGS: No evidence of fracture, dislocation, or joint effusion. Mild
osteophyte formation is noted medially and laterally. No significant
narrowing is noted. Soft tissues are unremarkable.
IMPRESSION: Mild degenerative changes are noted.  No acute abnormality seen.

## 2023-07-17 DIAGNOSIS — Z3183 Encounter for assisted reproductive fertility procedure cycle: Secondary | ICD-10-CM | POA: Diagnosis not present

## 2023-07-30 DIAGNOSIS — M5126 Other intervertebral disc displacement, lumbar region: Secondary | ICD-10-CM | POA: Diagnosis not present

## 2023-07-30 DIAGNOSIS — Z6838 Body mass index (BMI) 38.0-38.9, adult: Secondary | ICD-10-CM | POA: Diagnosis not present

## 2023-08-02 DIAGNOSIS — M797 Fibromyalgia: Secondary | ICD-10-CM | POA: Diagnosis not present

## 2023-08-02 DIAGNOSIS — Z6839 Body mass index (BMI) 39.0-39.9, adult: Secondary | ICD-10-CM | POA: Diagnosis not present

## 2023-08-02 DIAGNOSIS — N979 Female infertility, unspecified: Secondary | ICD-10-CM | POA: Diagnosis not present

## 2023-08-07 DIAGNOSIS — Z319 Encounter for procreative management, unspecified: Secondary | ICD-10-CM | POA: Diagnosis not present

## 2023-08-07 DIAGNOSIS — N912 Amenorrhea, unspecified: Secondary | ICD-10-CM | POA: Diagnosis not present

## 2023-08-07 DIAGNOSIS — Z3200 Encounter for pregnancy test, result unknown: Secondary | ICD-10-CM | POA: Diagnosis not present

## 2023-08-12 DIAGNOSIS — M3500 Sicca syndrome, unspecified: Secondary | ICD-10-CM | POA: Diagnosis not present

## 2023-08-12 DIAGNOSIS — M797 Fibromyalgia: Secondary | ICD-10-CM | POA: Diagnosis not present

## 2023-08-12 DIAGNOSIS — Z6839 Body mass index (BMI) 39.0-39.9, adult: Secondary | ICD-10-CM | POA: Diagnosis not present

## 2023-08-12 DIAGNOSIS — N979 Female infertility, unspecified: Secondary | ICD-10-CM | POA: Diagnosis not present

## 2023-08-20 DIAGNOSIS — N912 Amenorrhea, unspecified: Secondary | ICD-10-CM | POA: Diagnosis not present

## 2023-08-20 DIAGNOSIS — N856 Intrauterine synechiae: Secondary | ICD-10-CM | POA: Diagnosis not present

## 2023-08-20 DIAGNOSIS — N858 Other specified noninflammatory disorders of uterus: Secondary | ICD-10-CM | POA: Diagnosis not present

## 2023-10-01 ENCOUNTER — Other Ambulatory Visit: Payer: Self-pay | Admitting: Sports Medicine

## 2023-10-01 DIAGNOSIS — M7918 Myalgia, other site: Secondary | ICD-10-CM

## 2023-10-21 DIAGNOSIS — N856 Intrauterine synechiae: Secondary | ICD-10-CM | POA: Diagnosis not present

## 2023-10-21 DIAGNOSIS — Z3149 Encounter for other procreative investigation and testing: Secondary | ICD-10-CM | POA: Diagnosis not present

## 2023-10-21 DIAGNOSIS — E559 Vitamin D deficiency, unspecified: Secondary | ICD-10-CM | POA: Diagnosis not present

## 2023-10-21 DIAGNOSIS — Z319 Encounter for procreative management, unspecified: Secondary | ICD-10-CM | POA: Diagnosis not present

## 2023-10-28 ENCOUNTER — Ambulatory Visit

## 2023-10-28 ENCOUNTER — Other Ambulatory Visit: Payer: Self-pay

## 2023-10-28 ENCOUNTER — Encounter: Payer: Self-pay | Admitting: Sports Medicine

## 2023-10-28 ENCOUNTER — Ambulatory Visit: Admitting: Sports Medicine

## 2023-10-28 VITALS — BP 128/86 | Ht 67.0 in | Wt 267.0 lb

## 2023-10-28 DIAGNOSIS — M25871 Other specified joint disorders, right ankle and foot: Secondary | ICD-10-CM | POA: Diagnosis not present

## 2023-10-28 DIAGNOSIS — G8929 Other chronic pain: Secondary | ICD-10-CM

## 2023-10-28 DIAGNOSIS — S46011A Strain of muscle(s) and tendon(s) of the rotator cuff of right shoulder, initial encounter: Secondary | ICD-10-CM

## 2023-10-28 DIAGNOSIS — M25571 Pain in right ankle and joints of right foot: Secondary | ICD-10-CM | POA: Diagnosis not present

## 2023-10-28 MED ORDER — METHYLPREDNISOLONE ACETATE 40 MG/ML IJ SUSP
40.0000 mg | Freq: Once | INTRAMUSCULAR | Status: AC
  Administered 2023-10-28: 40 mg via INTRA_ARTICULAR

## 2023-10-28 NOTE — Progress Notes (Signed)
   Subjective:    Patient ID: Tobias JINNY Helena, female    DOB: 38 y.o., 1986-01-31   MRN: [id number]  HPI  Chief Complaint: Right ankle pain, right shoulder pain.  Former patient of Dr. Curtis. History of sleeve gastrectomy. Taking topiramate  as migraine prophylaxis and treatment of myofascial pain syndrome with depression. Planning injection in R ankle.  R shoulder pain as well.  Rotator cuff repair surgery with Dr. Cristy last year.  Has not gone to PT since finishing rehab protocol following surgery last year.  Pain she is experiencing now feels like the same pain she had prior to the shoulder surgery but worse.  Had flare of pain in R shoulder when reaching behind her to pick up binder in June.     Objective:   Physical Exam Vitals:   10/28/23 0921  BP: 128/86   Right ankle ( compared to normal ) Inspection: no swelling, collapsed longitudinal arches. Normal transverse arches without significant collapse. Palpation: TTP - ATFL, - CFL, - PTFL, - anterior joint line, - navicular, - base of 5th metatarsal, - deltoid, -  tarsal, - metatarsal, - achilles, - plantar fascia AROM/PROM: Dorsiflexion to 90 degrees.  Prominent amount of plantarflexion bringing foot parallel with contour of the shin.  Slightly limited eversion and inversion.  Terminal plantarflexion reproduces her pain.  FROM  of the great toe Strength: 5/5 plantarflexion, 5/5 dorsiflexion, 5/5 eversion, 5/5 inversion Special tests: Equivocal talar tilt test, - anterior drawer, - proximal squeeze, - calcaneal squeeze, - [place] ankle rules.  Gait: Antalgic with pushoff phase on right.  Calcaneal valgus noted which partially corrects when standing on toes.  Right shoulder ( compared to normal ) Inspection: - swelling, - scapular dyskinesis Palpation: TTP - greater tuberosity, - AC joint, + biceps tendon, + posterior shoulder AROM/PROM: 160 forward flexion (180 passive), 160 abduction (180 passive), 60 external rotation,  internal rotation to lumbar spine Strength: 5/5 lift off, 5/5 empty can, 5/5 external rotation, 5/5 flexion, - drop arm test Special tests:    -Rotator Cuff: - Neer's, - Hawkin's, - empty can (though reproduces pain), + painful arc   -Labrum: - O'brien's, + Jerk   -Biceps: + speed's, - yergason's    -AC Joint: - cross arm testing     -Instability: - external rotation/apprehension/relocation test, - sulcus sign      Assessment & Plan:   Dareth is a 38 y.o. presenting with right ankle pain most consistent with posterior ankle impingement and right shoulder pain most consistent with rotator cuff disease.  I do feel that an intra-articular corticosteroid injection in the tibiotalar joint would be indicated at this time.  Per my review of the medical literature, it appears that intra-articular corticosteroid injections at a low dose such as 20 mg intra-articularly in the ankle, are associated with a low systemic absorption and minimal adverse effects with no evidence in the literature suggesting that a single injection at this dose is a contraindication to IVF or adversely affects reproductive outcomes.  Based on this, we moved forward with a tibiotalar injection today.  I recommend engaging in physical therapy initially for her right shoulder with scheduled follow-up in 6 weeks to reassess and consider referral back to Dr. Cristy if her pain persists.

## 2023-10-28 NOTE — Addendum Note (Signed)
 Addended by: MARCINE HARLENE SAILOR on: 10/28/2023 11:05 AM   Modules accepted: Orders

## 2023-11-03 DIAGNOSIS — N390 Urinary tract infection, site not specified: Secondary | ICD-10-CM | POA: Diagnosis not present

## 2023-11-03 DIAGNOSIS — J302 Other seasonal allergic rhinitis: Secondary | ICD-10-CM | POA: Diagnosis not present

## 2023-11-03 DIAGNOSIS — N840 Polyp of corpus uteri: Secondary | ICD-10-CM | POA: Diagnosis not present

## 2023-11-03 DIAGNOSIS — I10 Essential (primary) hypertension: Secondary | ICD-10-CM | POA: Diagnosis not present

## 2023-11-04 NOTE — Therapy (Signed)
 OUTPATIENT PHYSICAL THERAPY LOWER EXTREMITY EVALUATION   Patient Name: Chelsea [name] MRN: [id number] DOB:[birth year], 38 y.o., female Today's Date: 11/04/2023  END OF SESSION:   Past Medical History:  Diagnosis Date   Depression    Endometrial polyp    Fibromyalgia    History of recurrent UTIs    Hypertension    Infertility, anovulation    Myofascial muscle pain    OA (osteoarthritis) of knee    BILATERAL   PCOS (polycystic ovarian syndrome)    PONV (postoperative nausea and vomiting)    Seasonal allergic rhinitis    Past Surgical History:  Procedure Laterality Date   CHOLECYSTECTOMY N/A 07/11/2021   Procedure: LAPAROSCOPIC CHOLECYSTECTOMY WITH INTRAOPERATIVE CHOLANGIOGRAM;  Surgeon: Belinda Cough, MD;  Location: [facility];  Service: General;  Laterality: N/A;   HYSTEROSCOPY WITH D & C N/A 09/07/2014   Procedure:  DILATATION AND CURETTAGE /HYSTEROSCOPY/POLYPECTOMY;  Surgeon: Cynthia Loss, MD;  Location: [facility];  Service: Gynecology;  Laterality: N/A;   HYSTEROSCOPY WITH D & C N/A 07/25/2021   Procedure: DILATATION AND CURETTAGE /HYSTEROSCOPY;  Surgeon: Cleotilde Ronal RAMAN, MD;  Location: [facility];  Service: Gynecology;  Laterality: N/A;   I & D RIGHT FOOT  1998   LAPAROSCOPIC GASTRIC SLEEVE RESECTION N/A 10/20/2017   Procedure: LAPAROSCOPIC GASTRIC SLEEVE RESECTION, UPPER ENDO, ERAS Pathway;  Surgeon: Signe Mitzie LABOR, MD;  Location: [facility];  Service: General;  Laterality: N/A;   POLYPECTOMY     uterine   TONSILLECTOMY  2001   WISDOM TOOTH EXTRACTION  2004   Patient Active Problem List   Diagnosis Date Noted   Left cervical radiculopathy 07/02/2022   Right carpal tunnel syndrome 04/01/2022   Left hand tendonitis 02/19/2022   Simple endometrial hyperplasia    Right ankle sprain 07/19/2020   Chronic pain syndrome 05/30/2020   Impingement syndrome of both shoulders 08/17/2019   Primary insomnia 05/11/2019   Mixed hypercholesterolemia  and hypertriglyceridemia 11/13/2018   Orgasmic headache 11/11/2018   Diet-controlled diabetes mellitus (HCC) 04/25/2017   Lumbar degenerative disc disease 08/24/2015   Intractable migraine without aura and with status migrainosus 06/24/2014   Myofascial pain syndrome with depression 02/08/2014   Venous stasis dermatitis 01/25/2014   Right hip pain 12/13/2013   Metatarsalgia of both feet 10/18/2013   Primary osteoarthritis of both knees 02/13/2012   Essential hypertension, benign 11/25/2008   POLYCYSTIC OVARIAN DISEASE 10/26/2008   History of gastric sleeve 10/26/2008    PCP: ***  REFERRING PROVIDER: ***  REFERRING DIAG: ***  THERAPY DIAG:  No diagnosis found.  Rationale for Evaluation and Treatment: {HABREHAB:27488}  ONSET DATE: ***  SUBJECTIVE:   SUBJECTIVE STATEMENT: ***  PERTINENT HISTORY: *** PAIN:  Are you having pain? {OPRCPAIN:27236}  PRECAUTIONS: {Therapy precautions:24002}  RED FLAGS: {PT Red Flags:29287}   WEIGHT BEARING RESTRICTIONS: {Yes ***/No:24003}  FALLS:  Has patient fallen in last 6 months? {fallsyesno:27318}  LIVING ENVIRONMENT: Lives with: {[facility] lives with:25569::lives with their family} Lives in: {Lives in:25570} Stairs: {opstairs:27293} Has following equipment at home: {Assistive devices:23999}  OCCUPATION: ***  PLOF: {PLOF:24004}  PATIENT GOALS: ***  NEXT MD VISIT: ***  OBJECTIVE:  Note: Objective measures were completed at Evaluation unless otherwise noted.  DIAGNOSTIC FINDINGS: ***  PATIENT SURVEYS:  {rehab surveys:24030}  COGNITION: Overall cognitive status: {cognition:24006}     SENSATION: {sensation:27233}  EDEMA:  {edema:24020}  MUSCLE LENGTH: Hamstrings: Right *** deg; Left *** deg Thomas test: Right *** deg; Left *** deg  POSTURE: {posture:25561}  PALPATION: ***  LOWER EXTREMITY ROM:  {AROM/PROM:27142} ROM Right eval Left eval  Hip flexion    Hip extension    Hip abduction    Hip adduction     Hip internal rotation    Hip external rotation    Knee flexion    Knee extension    Ankle dorsiflexion    Ankle plantarflexion    Ankle inversion    Ankle eversion     (Blank rows = not tested)  LOWER EXTREMITY MMT:  MMT Right eval Left eval  Hip flexion    Hip extension    Hip abduction    Hip adduction    Hip internal rotation    Hip external rotation    Knee flexion    Knee extension    Ankle dorsiflexion    Ankle plantarflexion    Ankle inversion    Ankle eversion     (Blank rows = not tested)  LOWER EXTREMITY SPECIAL TESTS:  {LEspecialtests:26242}  FUNCTIONAL TESTS:  {Functional tests:24029}  GAIT: Distance walked: *** Assistive device utilized: {Assistive devices:23999} Level of assistance: {Levels of assistance:24026} Comments: ***                                                                                                                                TREATMENT DATE: ***    PATIENT EDUCATION:  Education details: *** Person educated: {Person educated:25204} Education method: {Education Method:25205} Education comprehension: {Education Comprehension:25206}  HOME EXERCISE PROGRAM: ***  ASSESSMENT:  CLINICAL IMPRESSION: Patient is a *** y.o. *** who was seen today for physical therapy evaluation and treatment for ***.   OBJECTIVE IMPAIRMENTS: {opptimpairments:25111}.   ACTIVITY LIMITATIONS: {activitylimitations:27494}  PARTICIPATION LIMITATIONS: {participationrestrictions:25113}  PERSONAL FACTORS: {Personal factors:25162} are also affecting patient's functional outcome.   REHAB POTENTIAL: {rehabpotential:25112}  CLINICAL DECISION MAKING: {clinical decision making:25114}  EVALUATION COMPLEXITY: {Evaluation complexity:25115}   GOALS: Goals reviewed with patient? {yes/no:20286}  SHORT TERM GOALS: Target date: *** *** Baseline: Goal status: INITIAL  2.  *** Baseline:  Goal status: INITIAL  3.  *** Baseline:  Goal status:  INITIAL  4.  *** Baseline:  Goal status: INITIAL  5.  *** Baseline:  Goal status: INITIAL  6.  *** Baseline:  Goal status: INITIAL  LONG TERM GOALS: Target date: ***  *** Baseline:  Goal status: INITIAL  2.  *** Baseline:  Goal status: INITIAL  3.  *** Baseline:  Goal status: INITIAL  4.  *** Baseline:  Goal status: INITIAL  5.  *** Baseline:  Goal status: INITIAL  6.  *** Baseline:  Goal status: INITIAL   PLAN:  PT FREQUENCY: {rehab frequency:25116}  PT DURATION: {rehab duration:25117}  PLANNED INTERVENTIONS: {rehab planned interventions:25118::97110-Therapeutic exercises,97530- Therapeutic [phone]- Neuromuscular re-education,97535- Self Rjmz,02859- Manual therapy}  PLAN FOR NEXT SESSION: PIERRETTE Lucie Meeter, PT, DPT, ATC 11/04/23 5:00 PM

## 2023-11-05 ENCOUNTER — Other Ambulatory Visit: Payer: Self-pay

## 2023-11-05 ENCOUNTER — Ambulatory Visit

## 2023-11-05 DIAGNOSIS — M25511 Pain in right shoulder: Secondary | ICD-10-CM | POA: Diagnosis not present

## 2023-11-05 DIAGNOSIS — S46011A Strain of muscle(s) and tendon(s) of the rotator cuff of right shoulder, initial encounter: Secondary | ICD-10-CM | POA: Diagnosis not present

## 2023-11-05 DIAGNOSIS — R2689 Other abnormalities of gait and mobility: Secondary | ICD-10-CM | POA: Diagnosis not present

## 2023-11-05 DIAGNOSIS — M6281 Muscle weakness (generalized): Secondary | ICD-10-CM | POA: Insufficient documentation

## 2023-11-05 DIAGNOSIS — M25571 Pain in right ankle and joints of right foot: Secondary | ICD-10-CM | POA: Insufficient documentation

## 2023-11-05 DIAGNOSIS — R29898 Other symptoms and signs involving the musculoskeletal system: Secondary | ICD-10-CM | POA: Insufficient documentation

## 2023-11-05 DIAGNOSIS — M25871 Other specified joint disorders, right ankle and foot: Secondary | ICD-10-CM | POA: Insufficient documentation

## 2023-11-05 DIAGNOSIS — G8929 Other chronic pain: Secondary | ICD-10-CM | POA: Insufficient documentation

## 2023-11-11 ENCOUNTER — Encounter: Payer: Self-pay | Admitting: Physical Therapy

## 2023-11-11 ENCOUNTER — Ambulatory Visit: Admitting: Physical Therapy

## 2023-11-11 DIAGNOSIS — S46011A Strain of muscle(s) and tendon(s) of the rotator cuff of right shoulder, initial encounter: Secondary | ICD-10-CM | POA: Diagnosis not present

## 2023-11-11 DIAGNOSIS — G8929 Other chronic pain: Secondary | ICD-10-CM | POA: Diagnosis not present

## 2023-11-11 DIAGNOSIS — M25871 Other specified joint disorders, right ankle and foot: Secondary | ICD-10-CM | POA: Diagnosis not present

## 2023-11-11 DIAGNOSIS — M6281 Muscle weakness (generalized): Secondary | ICD-10-CM

## 2023-11-11 DIAGNOSIS — R29898 Other symptoms and signs involving the musculoskeletal system: Secondary | ICD-10-CM | POA: Diagnosis not present

## 2023-11-11 DIAGNOSIS — M25571 Pain in right ankle and joints of right foot: Secondary | ICD-10-CM

## 2023-11-11 DIAGNOSIS — M25511 Pain in right shoulder: Secondary | ICD-10-CM | POA: Diagnosis not present

## 2023-11-11 DIAGNOSIS — R2689 Other abnormalities of gait and mobility: Secondary | ICD-10-CM | POA: Diagnosis not present

## 2023-11-11 NOTE — Therapy (Signed)
 OUTPATIENT PHYSICAL THERAPY LOWER EXTREMITY EVALUATION   Patient Name: Chelsea [name] MRN: [id number] DOB:[birth year], 38 y.o., female Today's Date: 11/11/2023  END OF SESSION:  PT End of Session - 11/11/23 1608     Visit Number 2    Number of Visits 17    Date for PT Re-Evaluation 01/03/24    Authorization Type [employer]- 30 visits per year - 22 remaining    PT Start Time 1530    PT Stop Time 1611    PT Time Calculation (min) 41 min    Activity Tolerance Patient tolerated treatment well    Behavior During Therapy WFL for tasks assessed/performed           Past Medical History:  Diagnosis Date   Depression    Endometrial polyp    Fibromyalgia    History of recurrent UTIs    Hypertension    Infertility, anovulation    Myofascial muscle pain    OA (osteoarthritis) of knee    BILATERAL   PCOS (polycystic ovarian syndrome)    PONV (postoperative nausea and vomiting)    Seasonal allergic rhinitis    Past Surgical History:  Procedure Laterality Date   CHOLECYSTECTOMY N/A 07/11/2021   Procedure: LAPAROSCOPIC CHOLECYSTECTOMY WITH INTRAOPERATIVE CHOLANGIOGRAM;  Surgeon: Belinda Cough, MD;  Location: [facility];  Service: General;  Laterality: N/A;   HYSTEROSCOPY WITH D & C N/A 09/07/2014   Procedure:  DILATATION AND CURETTAGE /HYSTEROSCOPY/POLYPECTOMY;  Surgeon: Cynthia Loss, MD;  Location: [facility];  Service: Gynecology;  Laterality: N/A;   HYSTEROSCOPY WITH D & C N/A 07/25/2021   Procedure: DILATATION AND CURETTAGE /HYSTEROSCOPY;  Surgeon: Cleotilde Ronal RAMAN, MD;  [facility];  Service: Gynecology;  Laterality: N/A;   I & D RIGHT FOOT  1998   LAPAROSCOPIC GASTRIC SLEEVE RESECTION N/A 10/20/2017   Procedure: LAPAROSCOPIC GASTRIC SLEEVE RESECTION, UPPER ENDO, ERAS Pathway;  Surgeon: Signe Mitzie LABOR, MD;  Location: [facility];  Service: General;  Laterality: N/A;   POLYPECTOMY     uterine   TONSILLECTOMY  2001   WISDOM TOOTH EXTRACTION  2004    Patient Active Problem List   Diagnosis Date Noted   Left cervical radiculopathy 07/02/2022   Right carpal tunnel syndrome 04/01/2022   Left hand tendonitis 02/19/2022   Simple endometrial hyperplasia    Right ankle sprain 07/19/2020   Chronic pain syndrome 05/30/2020   Impingement syndrome of both shoulders 08/17/2019   Primary insomnia 05/11/2019   Mixed hypercholesterolemia and hypertriglyceridemia 11/13/2018   Orgasmic headache 11/11/2018   Diet-controlled diabetes mellitus (HCC) 04/25/2017   Lumbar degenerative disc disease 08/24/2015   Intractable migraine without aura and with status migrainosus 06/24/2014   Myofascial pain syndrome with depression 02/08/2014   Venous stasis dermatitis 01/25/2014   Right hip pain 12/13/2013   Metatarsalgia of both feet 10/18/2013   Primary osteoarthritis of both knees 02/13/2012   Essential hypertension, benign 11/25/2008   POLYCYSTIC OVARIAN DISEASE 10/26/2008   History of gastric sleeve 10/26/2008    PCP: none   REFERRING PROVIDER: Charles Redell LABOR, DO  REFERRING DIAG:  S46.011A (ICD-10-CM) - Strain of rotator cuff of right shoulder  M25.571,G89.29 (ICD-10-CM) - Chronic pain of right ankle  M25.871 (ICD-10-CM) - Ankle impingement syndrome, right    THERAPY DIAG:  Chronic right shoulder pain  Pain in right ankle and joints of right foot  Muscle weakness (generalized)  Rationale for Evaluation and Treatment: Rehabilitation  ONSET DATE: Rt shoulder May/June 2025, ankle chronic  SUBJECTIVE:   Patient reports she went to reach for a binder with the RUE in late May/early June and felt a pop in the shoulder and had pain following this movement. The pain has eased off a little bit since initial onset, but she can't reach and lift due to increased pain. She saw Dr. Charles who believes there could be a small rotator cuff tear. She reports off/on ankle pain for years, but the Rt ankle is much worse and was getting to the point  where she was limping when walking and notices the most pain when she tries to push-off on the foot. The pain is about the anterior ankle and dorsal aspect of the foot. She had recent injection that has provided minimal pain relief. Still can't stand and walk for long periods due to ankle pain. She underwent arthroscopic shoulder surgery describing distal clavicle excision and debridement back in 2023. Of note patient underwent IVF implantation yesterday and was instructed to take it easy over the next 5 days.  SUBJECTIVE STATEMENT: Pt states no change since initial eval. She states she did have trouble with the arch raise exercise  PERTINENT HISTORY: Rt shoulder surgery 2023 Fibromyalgia Rt ankle sprain Per patient history of back surgery  Recent IVF implantation  PAIN:  Are you having pain? Yes: NPRS scale: 3 currently; at worst 8 Pain location: Rt anterior shoulder/upper arm Pain description: sharp, pulling Aggravating factors: lifting, reaching, maintaining arms overhead Relieving factors: stretching, rest   Are you having pain? Yes: NPRS scale: 5 currently; at worst 10 Pain location: Rt anterior ankle/dorsum of foot Pain description: sharp, burning Aggravating factors: walking,standing Relieving factors: rest, ice, compression   PRECAUTIONS: Recent IVF implantation   RED FLAGS: None   WEIGHT BEARING RESTRICTIONS: No  FALLS:  Has patient fallen in last 6 months? No  LIVING ENVIRONMENT: Lives with: lives with their family Lives in: House/apartment Stairs: No Has following equipment at home: None  OCCUPATION: filing for disability   PLOF: occasionally needs assistance with bathing  PATIENT GOALS: get my ankle and shoulder to quit hurting.   NEXT MD VISIT: 12/01/23  OBJECTIVE:  Note: Objective measures were completed at Evaluation unless otherwise noted.  DIAGNOSTIC FINDINGS: no recent imaging   PATIENT SURVEYS:  Patient-specific activity scoring scheme (Point  to one number):  0 represents "unable to perform." 10 represents "able to perform at prior level. 0 1 2 3 4 5 6 7 8 9  10 (Date and Score) Activity Initial  Activity Eval     Standing > 10 minutes 0     Hairstyling   0    Light household activities  3    Additional Additional Total score = sum of the activity scores/number of activities Minimum detectable change (90%CI) for average score = 2 points Minimum detectable change (90%CI) for single activity score = 3 points PSFS developed by: Rosalee MYRTIS Marvis KYM Charlet CHRISTELLA., & Binkley, J. (1995). Assessing disability and change on individual  patients: a report of a patient specific measure. Physiotherapy [place], 47, [phone]. Reproduced with the permission of the authors  Score: 1  COGNITION: Overall cognitive status: Within functional limits for tasks assessed     SENSATION: WFL  EDEMA:  No obvious swelling   MUSCLE LENGTH: Hamstrings: lacking 50 degrees bilaterally    POSTURE: rounded shoulders, forward head, and genu valgum, pes planus   PALPATION: Diffuse tenderness about Rt shoulder  UPPER EXTREMITY MMT:  MMT Right eval Left eval  Shoulder flexion 4+  Shoulder scaption  4   Shoulder abduction 4+   Shoulder adduction    Shoulder extension    Shoulder internal rotation 5   Shoulder external rotation 4+   Middle trapezius    Lower trapezius    Elbow flexion    Elbow extension    Wrist flexion 5   Wrist extension    Wrist ulnar deviation    Wrist radial deviation    Wrist pronation    Wrist supination    Grip strength     (Blank rows = not tested); pain with all shoulder MMT UPPER EXTREMITY ROM:  Active ROM Right eval Left eval  Shoulder flexion 125 standing; pain    Shoulder extension    Shoulder abduction 70 standing; pain    Shoulder adduction    Shoulder extension    Shoulder internal rotation WNL   Shoulder external rotation WNL   Elbow flexion    Elbow extension    Wrist flexion     Wrist extension    Wrist ulnar deviation    Wrist radial deviation    Wrist pronation    Wrist supination     (Blank rows = not tested)  LOWER EXTREMITY ROM:  Active ROM Right eval Left eval  Hip flexion    Hip extension    Hip abduction    Hip adduction    Hip internal rotation    Hip external rotation    Knee flexion    Knee extension    Ankle dorsiflexion Lacking 5  Lacking 6   Ankle plantarflexion WNL WNL  Ankle inversion WNL WNL  Ankle eversion WNL WNL   (Blank rows = not tested)  LOWER EXTREMITY MMT:  MMT Right eval Left eval  Hip flexion    Hip extension    Hip abduction    Hip adduction    Hip internal rotation    Hip external rotation    Knee flexion    Knee extension    Ankle dorsiflexion 4 4  Ankle plantarflexion Partial range DL calf raise  Partial range DL calf raise  Ankle inversion 4+ 4+  Ankle eversion 4+ 4+   (Blank rows = not tested)  SPECIAL TESTS:  (+) Empty can   FUNCTIONAL TESTS:  SLS: 2 seconds bilaterally   GAIT: Distance walked: 20 ft  Assistive device utilized: None Level of assistance: Complete Independence Comments: foot ER, decreased heel strike, decreased push-off                                                                                                                                [facility] Adult PT Treatment:                                                DATE: 11/11/23 Therapeutic Exercise: Seated  HS stretch 2 x 30 sec Gastroc stretch 2 x 30 sec  Neuromuscular re-ed: SLS 2 x 20 sec bilat with intermittent UE support Tandem stance row green TB x 20 Rocker board A/P 2 x 1 min Bilat ER green TB Horizontal abd green TB Scaption x 10 bilat Therapeutic Activity: Seated PF black TB x 20 bilat Great toe ext x 20 bilat Wall slide flexion Wall slide abduction   [facility] Adult PT Treatment:                                                DATE: 11/05/23 Therapeutic Exercise: Demonstrated,performed, and issued initial  HEP.     PATIENT EDUCATION:  Education details: see treatment; POC  Person educated: Patient Education method: Explanation, Demonstration, Tactile cues, Verbal cues, and Handouts Education comprehension: verbalized understanding, returned demonstration, verbal cues required, tactile cues required, and needs further education  HOME EXERCISE PROGRAM: Access Code: [email or web address] URL: [email or web address] Date: 11/05/2023 Prepared by: Lucie Meeter  Exercises - Seated Hamstring Stretch  - 1 x daily - 7 x weekly - 3 sets - 30 sec  hold - Gastroc Stretch on Wall  - 1 x daily - 7 x weekly - 3 sets - 30 sec  hold - Seated Arch Lifts  - 1 x daily - 7 x weekly - 2 sets - 10 reps - Seated Great Toe Extension  - 1 x daily - 7 x weekly - 2 sets - 10 reps - Shoulder Flexion Wall Slide with Towel  - 1 x daily - 7 x weekly - 2 sets - 10 reps  ASSESSMENT:  CLINICAL IMPRESSION: Pt with good tolerance to progression of shoulder and ankle exercises today. She does have pain with active abduction and scaption over 90 degrees but does well with exercises below 90 degrees.  OBJECTIVE IMPAIRMENTS: Abnormal gait, decreased activity tolerance, decreased balance, decreased endurance, decreased knowledge of condition, difficulty walking, decreased ROM, decreased strength, increased fascial restrictions, impaired flexibility, impaired UE functional use, improper body mechanics, postural dysfunction, and pain.      GOALS: Goals reviewed with patient? Yes  SHORT TERM GOALS: Target date: 12/03/2023   Patient will be independent and compliant with initial HEP.   Baseline: initial HEP issued  Goal status: INITIAL  2.  Patient will improve hamstring flexibility by at least 10 degrees bilaterally to improve gait stride.  Baseline: see above Goal status: INITIAL  3.  Patient will demonstrate at least neutral ankle DF AROM to improve gait mechanics.  Baseline: see above  Goal status:  INITIAL  4.  Patient will demonstrate at least 90 degrees of Rt shoulder abduction AROM to improve ability to complete reaching activity.  Baseline: see above  Goal status: INITIAL   LONG TERM GOALS: Target date: 01/03/24  Patient will maintain SLS for at least 5 seconds to improve stability when navigating on uneven terrain.  Baseline: see above  Goal status: INITIAL  2.  Patient will demonstrate 5/5 bilateral ankle strength to improve gait stability.  Baseline: see above Goal status: INITIAL  3.  Patient will demonstrate at least 160 degrees of Rt shoulder flexion AROM to improve ability to complete reaching activity.  Baseline: see above Goal status: INITIAL  4.  Patient will score at least a 4 on the PSFS to signify clinically meaningful improvement in functional abilities.  Baseline: 1  Goal status: INITIAL  5.  Patient will report at least a 50% improvement in pain to improve her ability to perform ADLs.  Baseline: 0 Goal status: INITIAL  6.  Patient will be independent with advanced home program to progress/maintain current level of function.  Baseline: initial HEP issued  Goal status: INITIAL   PLAN:  PT FREQUENCY: 1-2x/week  PT DURATION: 8 weeks  PLANNED INTERVENTIONS: 97164- PT Re-evaluation, 97750- Physical Performance Testing, 97110-Therapeutic exercises, 97530- Therapeutic activity, [id number]- Neuromuscular re-education, 97535- Self Care, 02859- Manual therapy, [id number]- Gait training, [phone]- Aquatic Therapy, [phone] (1-2 muscles), 20561 (3+ muscles)- Dry Needling, Taping, Cryotherapy, and Moist heat  PLAN FOR NEXT SESSION: UPDATE HEP hamstring and calf stretching; ankle mobilizations; consider arch taping, foot intrinsic strengthening; shoulder AAROM, shoulder isometrics.   Darice Conine, PT,DPT09/16/254:12 PM

## 2023-11-12 DIAGNOSIS — Z3200 Encounter for pregnancy test, result unknown: Secondary | ICD-10-CM | POA: Diagnosis not present

## 2023-11-17 NOTE — Therapy (Signed)
 OUTPATIENT PHYSICAL THERAPY LOWER EXTREMITY TREATMENT   Patient Name: Chelsea [name] MRN: [id number] DOB:Jul 07, 1985, 38 y.o., female Today's Date: 11/17/2023  END OF SESSION:     Past Medical History:  Diagnosis Date   Depression    Endometrial polyp    Fibromyalgia    History of recurrent UTIs    Hypertension    Infertility, anovulation    Myofascial muscle pain    OA (osteoarthritis) of knee    BILATERAL   PCOS (polycystic ovarian syndrome)    PONV (postoperative nausea and vomiting)    Seasonal allergic rhinitis    Past Surgical History:  Procedure Laterality Date   CHOLECYSTECTOMY N/A 07/11/2021   Procedure: LAPAROSCOPIC CHOLECYSTECTOMY WITH INTRAOPERATIVE CHOLANGIOGRAM;  Surgeon: Belinda Cough, MD;  Location: [facility];  Service: General;  Laterality: N/A;   HYSTEROSCOPY WITH D & C N/A 09/07/2014   Procedure:  DILATATION AND CURETTAGE /HYSTEROSCOPY/POLYPECTOMY;  Surgeon: Cynthia Loss, MD;  Location: [facility];  Service: Gynecology;  Laterality: N/A;   HYSTEROSCOPY WITH D & C N/A 07/25/2021   Procedure: DILATATION AND CURETTAGE /HYSTEROSCOPY;  Surgeon: Cleotilde Ronal RAMAN, MD;  Location: [facility];  Service: Gynecology;  Laterality: N/A;   I & D RIGHT FOOT  1998   LAPAROSCOPIC GASTRIC SLEEVE RESECTION N/A 10/20/2017   Procedure: LAPAROSCOPIC GASTRIC SLEEVE RESECTION, UPPER ENDO, ERAS Pathway;  Surgeon: Signe Mitzie LABOR, MD;  Location: [facility];  Service: General;  Laterality: N/A;   POLYPECTOMY     uterine   TONSILLECTOMY  2001   WISDOM TOOTH EXTRACTION  2004   Patient Active Problem List   Diagnosis Date Noted   Left cervical radiculopathy 07/02/2022   Right carpal tunnel syndrome 04/01/2022   Left hand tendonitis 02/19/2022   Simple endometrial hyperplasia    Right ankle sprain 07/19/2020   Chronic pain syndrome 05/30/2020   Impingement syndrome of both shoulders 08/17/2019   Primary insomnia 05/11/2019   Mixed  hypercholesterolemia and hypertriglyceridemia 11/13/2018   Orgasmic headache 11/11/2018   Diet-controlled diabetes mellitus (HCC) 04/25/2017   Lumbar degenerative disc disease 08/24/2015   Intractable migraine without aura and with status migrainosus 06/24/2014   Myofascial pain syndrome with depression 02/08/2014   Venous stasis dermatitis 01/25/2014   Right hip pain 12/13/2013   Metatarsalgia of both feet 10/18/2013   Primary osteoarthritis of both knees 02/13/2012   Essential hypertension, benign 11/25/2008   POLYCYSTIC OVARIAN DISEASE 10/26/2008   History of gastric sleeve 10/26/2008    PCP: none   REFERRING PROVIDER: Charles Redell LABOR, DO  REFERRING DIAG:  S46.011A (ICD-10-CM) - Strain of rotator cuff of right shoulder  M25.571,G89.29 (ICD-10-CM) - Chronic pain of right ankle  M25.871 (ICD-10-CM) - Ankle impingement syndrome, right    THERAPY DIAG:  No diagnosis found.  Rationale for Evaluation and Treatment: Rehabilitation  ONSET DATE: Rt shoulder May/June 2025, ankle chronic   SUBJECTIVE:   Patient reports she went to reach for a binder with the RUE in late May/early June and felt a pop in the shoulder and had pain following this movement. The pain has eased off a little bit since initial onset, but she can't reach and lift due to increased pain. She saw Dr. Charles who believes there could be a small rotator cuff tear. She reports off/on ankle pain for years, but the Rt ankle is much worse and was getting to the point where she was limping when walking and notices the most pain when she tries to push-off on the  foot. The pain is about the anterior ankle and dorsal aspect of the foot. She had recent injection that has provided minimal pain relief. Still can't stand and walk for long periods due to ankle pain. She underwent arthroscopic shoulder surgery describing distal clavicle excision and debridement back in 2023. Of note patient underwent IVF implantation yesterday and  was instructed to take it easy over the next 5 days.  SUBJECTIVE STATEMENT: Pt reports  PERTINENT HISTORY: Rt shoulder surgery 2023 Fibromyalgia Rt ankle sprain Per patient history of back surgery  Recent IVF implantation  PAIN:  Are you having pain? Yes: NPRS scale: 3 currently; at worst 8 Pain location: Rt anterior shoulder/upper arm Pain description: sharp, pulling Aggravating factors: lifting, reaching, maintaining arms overhead Relieving factors: stretching, rest   Are you having pain? Yes: NPRS scale: 5 currently; at worst 10 Pain location: Rt anterior ankle/dorsum of foot Pain description: sharp, burning Aggravating factors: walking,standing Relieving factors: rest, ice, compression   PRECAUTIONS: Recent IVF implantation   RED FLAGS: None   WEIGHT BEARING RESTRICTIONS: No  FALLS:  Has patient fallen in last 6 months? No  LIVING ENVIRONMENT: Lives with: lives with their family Lives in: House/apartment Stairs: No Has following equipment at home: None  OCCUPATION: filing for disability   PLOF: occasionally needs assistance with bathing  PATIENT GOALS: get my ankle and shoulder to quit hurting.   NEXT MD VISIT: 12/01/23  OBJECTIVE:  Note: Objective measures were completed at Evaluation unless otherwise noted.  DIAGNOSTIC FINDINGS: no recent imaging   PATIENT SURVEYS:  Patient-specific activity scoring scheme (Point to one number):  0 represents "unable to perform." 10 represents "able to perform at prior level. 0 1 2 3 4 5 6 7 8 9  10 (Date and Score) Activity Initial  Activity Eval     Standing > 10 minutes 0     Hairstyling   0    Light household activities  3    Additional Additional Total score = sum of the activity scores/number of activities Minimum detectable change (90%CI) for average score = 2 points Minimum detectable change (90%CI) for single activity score = 3 points PSFS developed by: Rosalee MYRTIS Marvis KYM Charlet CHRISTELLA., &  Binkley, J. (1995). Assessing disability and change on individual  patients: a report of a patient specific measure. Physiotherapy [place], 47, [phone]. Reproduced with the permission of the authors  Score: 1  COGNITION: Overall cognitive status: Within functional limits for tasks assessed     SENSATION: WFL  EDEMA:  No obvious swelling   MUSCLE LENGTH: Hamstrings: lacking 50 degrees bilaterally    POSTURE: rounded shoulders, forward head, and genu valgum, pes planus   PALPATION: Diffuse tenderness about Rt shoulder  UPPER EXTREMITY MMT:  MMT Right eval Left eval  Shoulder flexion 4+   Shoulder scaption  4   Shoulder abduction 4+   Shoulder adduction    Shoulder extension    Shoulder internal rotation 5   Shoulder external rotation 4+   Middle trapezius    Lower trapezius    Elbow flexion    Elbow extension    Wrist flexion 5   Wrist extension    Wrist ulnar deviation    Wrist radial deviation    Wrist pronation    Wrist supination    Grip strength     (Blank rows = not tested); pain with all shoulder MMT UPPER EXTREMITY ROM:  Active ROM Right eval Left eval  Shoulder flexion 125 standing; pain  Shoulder extension    Shoulder abduction 70 standing; pain    Shoulder adduction    Shoulder extension    Shoulder internal rotation WNL   Shoulder external rotation WNL   Elbow flexion    Elbow extension    Wrist flexion    Wrist extension    Wrist ulnar deviation    Wrist radial deviation    Wrist pronation    Wrist supination     (Blank rows = not tested)  LOWER EXTREMITY ROM:  Active ROM Right eval Left eval  Hip flexion    Hip extension    Hip abduction    Hip adduction    Hip internal rotation    Hip external rotation    Knee flexion    Knee extension    Ankle dorsiflexion Lacking 5  Lacking 6   Ankle plantarflexion WNL WNL  Ankle inversion WNL WNL  Ankle eversion WNL WNL   (Blank rows = not tested)  LOWER EXTREMITY MMT:  MMT  Right eval Left eval  Hip flexion    Hip extension    Hip abduction    Hip adduction    Hip internal rotation    Hip external rotation    Knee flexion    Knee extension    Ankle dorsiflexion 4 4  Ankle plantarflexion Partial range DL calf raise  Partial range DL calf raise  Ankle inversion 4+ 4+  Ankle eversion 4+ 4+   (Blank rows = not tested)  SPECIAL TESTS:  (+) Empty can   FUNCTIONAL TESTS:  SLS: 2 seconds bilaterally   GAIT: Distance walked: 20 ft  Assistive device utilized: None Level of assistance: Complete Independence Comments: foot ER, decreased heel strike, decreased push-off  [facility] Adult PT Treatment:                                                DATE: 11/18/23 Therapeutic Exercise: Seated HS stretch 2 x 30 sec Gastroc stretch 2 x 30 sec Manual Therapy: *** Neuromuscular re-ed: SLS 2 x 20 sec bilat with intermittent UE support Tandem stance row green TB x 20 Rocker board A/P 2 x 1 min Bilat ER green TB Horizontal abd green TB Scaption x 10 bilat Therapeutic Activity: Seated PF black TB x 20 bilat Great toe ext x 20 bilat Wall slide flexion Wall slide abduction Modalities: *** Self Care: ***                                                                                                                                 [facility] Adult PT Treatment:  DATE: 11/11/23 Therapeutic Exercise: Seated HS stretch 2 x 30 sec Gastroc stretch 2 x 30 sec  Neuromuscular re-ed: SLS 2 x 20 sec bilat with intermittent UE support Tandem stance row green TB x 20 Rocker board A/P 2 x 1 min Bilat ER green TB Horizontal abd green TB Scaption x 10 bilat Therapeutic Activity: Seated PF black TB x 20 bilat Great toe ext x 20 bilat Wall slide flexion Wall slide abduction   [facility] Adult PT Treatment:                                                DATE: 11/05/23 Therapeutic Exercise: Demonstrated,performed, and issued initial  HEP.     PATIENT EDUCATION:  Education details: see treatment; POC  Person educated: Patient Education method: Explanation, Demonstration, Tactile cues, Verbal cues, and Handouts Education comprehension: verbalized understanding, returned demonstration, verbal cues required, tactile cues required, and needs further education  HOME EXERCISE PROGRAM: Access Code: [email or web address] URL: [email or web address] Date: 11/05/2023 Prepared by: Lucie Meeter  Exercises - Seated Hamstring Stretch  - 1 x daily - 7 x weekly - 3 sets - 30 sec  hold - Gastroc Stretch on Wall  - 1 x daily - 7 x weekly - 3 sets - 30 sec  hold - Seated Arch Lifts  - 1 x daily - 7 x weekly - 2 sets - 10 reps - Seated Great Toe Extension  - 1 x daily - 7 x weekly - 2 sets - 10 reps - Shoulder Flexion Wall Slide with Towel  - 1 x daily - 7 x weekly - 2 sets - 10 reps  ASSESSMENT:  CLINICAL IMPRESSION: Pt with good tolerance to progression of shoulder and ankle exercises today. She does have pain with active abduction and scaption over 90 degrees but does well with exercises below 90 degrees.  OBJECTIVE IMPAIRMENTS: Abnormal gait, decreased activity tolerance, decreased balance, decreased endurance, decreased knowledge of condition, difficulty walking, decreased ROM, decreased strength, increased fascial restrictions, impaired flexibility, impaired UE functional use, improper body mechanics, postural dysfunction, and pain.      GOALS: Goals reviewed with patient? Yes  SHORT TERM GOALS: Target date: 12/03/2023   Patient will be independent and compliant with initial HEP.   Baseline: initial HEP issued  Goal status: INITIAL  2.  Patient will improve hamstring flexibility by at least 10 degrees bilaterally to improve gait stride.  Baseline: see above Goal status: INITIAL  3.  Patient will demonstrate at least neutral ankle DF AROM to improve gait mechanics.  Baseline: see above  Goal status:  INITIAL  4.  Patient will demonstrate at least 90 degrees of Rt shoulder abduction AROM to improve ability to complete reaching activity.  Baseline: see above  Goal status: INITIAL   LONG TERM GOALS: Target date: 01/03/24  Patient will maintain SLS for at least 5 seconds to improve stability when navigating on uneven terrain.  Baseline: see above  Goal status: INITIAL  2.  Patient will demonstrate 5/5 bilateral ankle strength to improve gait stability.  Baseline: see above Goal status: INITIAL  3.  Patient will demonstrate at least 160 degrees of Rt shoulder flexion AROM to improve ability to complete reaching activity.  Baseline: see above Goal status: INITIAL  4.  Patient will score at least a 4 on the PSFS to signify clinically meaningful improvement in  functional abilities.  Baseline: 1 Goal status: INITIAL  5.  Patient will report at least a 50% improvement in pain to improve her ability to perform ADLs.  Baseline: 0 Goal status: INITIAL  6.  Patient will be independent with advanced home program to progress/maintain current level of function.  Baseline: initial HEP issued  Goal status: INITIAL   PLAN:  PT FREQUENCY: 1-2x/week  PT DURATION: 8 weeks  PLANNED INTERVENTIONS: [id number]- PT Re-evaluation, 97750- Physical Performance Testing, 97110-Therapeutic exercises, 97530- Therapeutic activity, [id number]- Neuromuscular re-education, 97535- Self Care, [id number]- Manual therapy, [id number]- Gait training, [id number]- Aquatic Therapy, [phone] (1-2 muscles), [id number] (3+ muscles)- Dry Needling, Taping, Cryotherapy, and Moist heat  PLAN FOR NEXT SESSION: UPDATE HEP hamstring and calf stretching; ankle mobilizations; consider arch taping, foot intrinsic strengthening; shoulder AAROM, shoulder isometrics.   Lavanda Cleverly, SPT 11/17/23 3:19 PM

## 2023-11-18 ENCOUNTER — Encounter: Payer: Self-pay | Admitting: Physical Therapy

## 2023-11-18 ENCOUNTER — Ambulatory Visit: Payer: Self-pay | Admitting: Physical Therapy

## 2023-11-18 DIAGNOSIS — R2689 Other abnormalities of gait and mobility: Secondary | ICD-10-CM | POA: Diagnosis not present

## 2023-11-18 DIAGNOSIS — M25511 Pain in right shoulder: Secondary | ICD-10-CM | POA: Diagnosis not present

## 2023-11-18 DIAGNOSIS — M25571 Pain in right ankle and joints of right foot: Secondary | ICD-10-CM

## 2023-11-18 DIAGNOSIS — M6281 Muscle weakness (generalized): Secondary | ICD-10-CM | POA: Diagnosis not present

## 2023-11-18 DIAGNOSIS — R29898 Other symptoms and signs involving the musculoskeletal system: Secondary | ICD-10-CM | POA: Diagnosis not present

## 2023-11-18 DIAGNOSIS — G8929 Other chronic pain: Secondary | ICD-10-CM

## 2023-11-18 DIAGNOSIS — S46011A Strain of muscle(s) and tendon(s) of the rotator cuff of right shoulder, initial encounter: Secondary | ICD-10-CM | POA: Diagnosis not present

## 2023-11-18 DIAGNOSIS — M25871 Other specified joint disorders, right ankle and foot: Secondary | ICD-10-CM | POA: Diagnosis not present

## 2023-11-20 ENCOUNTER — Ambulatory Visit: Payer: Self-pay

## 2023-11-20 DIAGNOSIS — R29898 Other symptoms and signs involving the musculoskeletal system: Secondary | ICD-10-CM | POA: Diagnosis not present

## 2023-11-20 DIAGNOSIS — M25571 Pain in right ankle and joints of right foot: Secondary | ICD-10-CM | POA: Diagnosis not present

## 2023-11-20 DIAGNOSIS — G8929 Other chronic pain: Secondary | ICD-10-CM | POA: Diagnosis not present

## 2023-11-20 DIAGNOSIS — S46011A Strain of muscle(s) and tendon(s) of the rotator cuff of right shoulder, initial encounter: Secondary | ICD-10-CM | POA: Diagnosis not present

## 2023-11-20 DIAGNOSIS — M25511 Pain in right shoulder: Secondary | ICD-10-CM

## 2023-11-20 DIAGNOSIS — M6281 Muscle weakness (generalized): Secondary | ICD-10-CM

## 2023-11-20 DIAGNOSIS — R2689 Other abnormalities of gait and mobility: Secondary | ICD-10-CM

## 2023-11-20 DIAGNOSIS — M25871 Other specified joint disorders, right ankle and foot: Secondary | ICD-10-CM | POA: Diagnosis not present

## 2023-11-20 NOTE — Therapy (Addendum)
 OUTPATIENT PHYSICAL THERAPY LOWER EXTREMITY TREATMENT   Patient Name: Chelsea [name] MRN: [id number] DOB:1985/05/25, 38 y.o., female Today's Date: 11/20/2023  END OF SESSION:  PT End of Session - 11/20/23 1656     Visit Number 4    Number of Visits 17    Date for Recertification  01/03/24    Authorization Type [employer]- 30 visits per year - 22 remaining    PT Start Time 1447    PT Stop Time 1530    PT Time Calculation (min) 43 min    Activity Tolerance Patient tolerated treatment well    Behavior During Therapy WFL for tasks assessed/performed            Past Medical History:  Diagnosis Date   Depression    Endometrial polyp    Fibromyalgia    History of recurrent UTIs    Hypertension    Infertility, anovulation    Myofascial muscle pain    OA (osteoarthritis) of knee    BILATERAL   PCOS (polycystic ovarian syndrome)    PONV (postoperative nausea and vomiting)    Seasonal allergic rhinitis    Past Surgical History:  Procedure Laterality Date   CHOLECYSTECTOMY N/A 07/11/2021   Procedure: LAPAROSCOPIC CHOLECYSTECTOMY WITH INTRAOPERATIVE CHOLANGIOGRAM;  Surgeon: Belinda Cough, MD;  Location: [facility];  Service: General;  Laterality: N/A;   HYSTEROSCOPY WITH D & C N/A 09/07/2014   Procedure:  DILATATION AND CURETTAGE /HYSTEROSCOPY/POLYPECTOMY;  Surgeon: Cynthia Loss, MD;  Location: [facility];  Service: Gynecology;  Laterality: N/A;   HYSTEROSCOPY WITH D & C N/A 07/25/2021   Procedure: DILATATION AND CURETTAGE /HYSTEROSCOPY;  Surgeon: Cleotilde Ronal RAMAN, MD;  Location: [facility];  Service: Gynecology;  Laterality: N/A;   I & D RIGHT FOOT  1998   LAPAROSCOPIC GASTRIC SLEEVE RESECTION N/A 10/20/2017   Procedure: LAPAROSCOPIC GASTRIC SLEEVE RESECTION, UPPER ENDO, ERAS Pathway;  Surgeon: Signe Mitzie LABOR, MD;  Location: [facility];  Service: General;  Laterality: N/A;   POLYPECTOMY     uterine   TONSILLECTOMY  2001   WISDOM TOOTH EXTRACTION  2004    Patient Active Problem List   Diagnosis Date Noted   Left cervical radiculopathy 07/02/2022   Right carpal tunnel syndrome 04/01/2022   Left hand tendonitis 02/19/2022   Simple endometrial hyperplasia    Right ankle sprain 07/19/2020   Chronic pain syndrome 05/30/2020   Impingement syndrome of both shoulders 08/17/2019   Primary insomnia 05/11/2019   Mixed hypercholesterolemia and hypertriglyceridemia 11/13/2018   Orgasmic headache 11/11/2018   Diet-controlled diabetes mellitus (HCC) 04/25/2017   Lumbar degenerative disc disease 08/24/2015   Intractable migraine without aura and with status migrainosus 06/24/2014   Myofascial pain syndrome with depression 02/08/2014   Venous stasis dermatitis 01/25/2014   Right hip pain 12/13/2013   Metatarsalgia of both feet 10/18/2013   Primary osteoarthritis of both knees 02/13/2012   Essential hypertension, benign 11/25/2008   POLYCYSTIC OVARIAN DISEASE 10/26/2008   History of gastric sleeve 10/26/2008    PCP: none   REFERRING PROVIDER: Charles Redell LABOR, DO  REFERRING DIAG:  S46.011A (ICD-10-CM) - Strain of rotator cuff of right shoulder  M25.571,G89.29 (ICD-10-CM) - Chronic pain of right ankle  M25.871 (ICD-10-CM) - Ankle impingement syndrome, right    THERAPY DIAG:  Chronic right shoulder pain  Pain in right ankle and joints of right foot  Muscle weakness (generalized)  Acute pain of right shoulder  Other symptoms and signs involving the musculoskeletal system  Other abnormalities of gait and mobility  Rationale for Evaluation and Treatment: Rehabilitation  ONSET DATE: Rt shoulder May/June 2025, ankle chronic   SUBJECTIVE:   Initial Eval: Patient reports she went to reach for a binder with the RUE in late May/early June and felt a pop in the shoulder and had pain following this movement. The pain has eased off a little bit since initial onset, but she can't reach and lift due to increased pain. She saw Dr. Charles  who believes there could be a small rotator cuff tear. She reports off/on ankle pain for years, but the Rt ankle is much worse and was getting to the point where she was limping when walking and notices the most pain when she tries to push-off on the foot. The pain is about the anterior ankle and dorsal aspect of the foot. She had recent injection that has provided minimal pain relief. Still can't stand and walk for long periods due to ankle pain. She underwent arthroscopic shoulder surgery describing distal clavicle excision and debridement back in 2023. Of note patient underwent IVF implantation yesterday and was instructed to take it easy over the next 5 days.  SUBJECTIVE STATEMENT: Pt reports being sore after last session in her back, and right shoulder with just some mild pain. She states she felt tightness in the back.   PERTINENT HISTORY: Rt shoulder surgery 2023 Fibromyalgia Rt ankle sprain Per patient history of back surgery  Recent IVF implantation  PAIN:  Are you having pain? Yes: NPRS scale: 1-2; at worst 8 Pain location: Rt anterior shoulder/upper arm Pain description: sharp, pulling Aggravating factors: lifting, reaching, maintaining arms overhead Relieving factors: stretching, rest   Are you having pain? Yes: NPRS scale: 5 currently; at worst 10 Pain location: Rt anterior ankle/dorsum of foot Pain description: sharp, burning Aggravating factors: walking,standing Relieving factors: rest, ice, compression   PRECAUTIONS: Recent IVF implantation   RED FLAGS: None   WEIGHT BEARING RESTRICTIONS: No  FALLS:  Has patient fallen in last 6 months? No  LIVING ENVIRONMENT: Lives with: lives with their family Lives in: House/apartment Stairs: No Has following equipment at home: None  OCCUPATION: filing for disability   PLOF: occasionally needs assistance with bathing  PATIENT GOALS: get my ankle and shoulder to quit hurting.   NEXT MD VISIT: 12/01/23  OBJECTIVE:   Note: Objective measures were completed at Evaluation unless otherwise noted.  DIAGNOSTIC FINDINGS: no recent imaging   PATIENT SURVEYS:  Patient-specific activity scoring scheme (Point to one number):  0 represents "unable to perform." 10 represents "able to perform at prior level. 0 1 2 3 4 5 6 7 8 9  10 (Date and Score) Activity Initial  Activity Eval     Standing > 10 minutes 0     Hairstyling   0    Light household activities  3    Additional Additional Total score = sum of the activity scores/number of activities Minimum detectable change (90%CI) for average score = 2 points Minimum detectable change (90%CI) for single activity score = 3 points PSFS developed by: Rosalee MYRTIS Marvis KYM Charlet CHRISTELLA., & Binkley, J. (1995). Assessing disability and change on individual  patients: a report of a patient specific measure. Physiotherapy [place], 47, [phone]. Reproduced with the permission of the authors  Score: 1  COGNITION: Overall cognitive status: Within functional limits for tasks assessed     SENSATION: WFL  EDEMA:  No obvious swelling   MUSCLE LENGTH: Hamstrings: lacking 50 degrees bilaterally  POSTURE: rounded shoulders, forward head, and genu valgum, pes planus   PALPATION: Diffuse tenderness about Rt shoulder  UPPER EXTREMITY MMT:  MMT Right eval Left eval  Shoulder flexion 4+   Shoulder scaption  4   Shoulder abduction 4+   Shoulder adduction    Shoulder extension    Shoulder internal rotation 5   Shoulder external rotation 4+   Middle trapezius    Lower trapezius    Elbow flexion    Elbow extension    Wrist flexion 5   Wrist extension    Wrist ulnar deviation    Wrist radial deviation    Wrist pronation    Wrist supination    Grip strength     (Blank rows = not tested); pain with all shoulder MMT UPPER EXTREMITY ROM:  Active ROM Right eval Left eval 11/20/23 Right  Shoulder flexion 125 standing; pain     Shoulder extension      Shoulder abduction 70 standing; pain   95 deg No pain  Shoulder adduction     Shoulder extension     Shoulder internal rotation WNL    Shoulder external rotation WNL    Elbow flexion     Elbow extension     Wrist flexion     Wrist extension     Wrist ulnar deviation     Wrist radial deviation     Wrist pronation     Wrist supination      (Blank rows = not tested)  LOWER EXTREMITY ROM:  Active ROM Right eval Left eval 11/20/23  Hip flexion     Hip extension     Hip abduction     Hip adduction     Hip internal rotation     Hip external rotation     Knee flexion     Knee extension     Ankle dorsiflexion Lacking 5  Lacking 6  Rt: lacking 3  Ankle plantarflexion WNL WNL   Ankle inversion WNL WNL   Ankle eversion WNL WNL    (Blank rows = not tested)  LOWER EXTREMITY MMT:  MMT Right eval Left eval  Hip flexion    Hip extension    Hip abduction    Hip adduction    Hip internal rotation    Hip external rotation    Knee flexion    Knee extension    Ankle dorsiflexion 4 4  Ankle plantarflexion Partial range DL calf raise  Partial range DL calf raise  Ankle inversion 4+ 4+  Ankle eversion 4+ 4+   (Blank rows = not tested)  SPECIAL TESTS:  (+) Empty can   FUNCTIONAL TESTS:  SLS: 2 seconds bilaterally   GAIT: Distance walked: 20 ft  Assistive device utilized: None Level of assistance: Complete Independence Comments: foot ER, decreased heel strike, decreased push-off   [facility] Adult PT Treatment:                                                DATE: 11/20/23 Therapeutic Exercise: Nustep level 5 for 5 min- SPT present to discuss pt status AROM shoulder retraction in seated x10  Sidelying open books x10 each side for thoracic spine and pec mobility Reassessment of right shoulder abduction and right ankle dorsiflexion Manual Therapy: STM to bil upper traps, lev scap, rotator cuff muscles finding increased muscle tightness Posterior  glides to right ankle using  Maitland scale grade 4 to increase ankle dorsiflexion motion  Neuromuscular re-ed: MELT method to bilateral calves: gliding and shearing 3x to 3 regions of lower leg to promote neurofascial tissue release  Rockerboard ant/post x20 for increased ankle proprioception Therapeutic Activity: Snow angels on MELT method foam roller x10- right arm only 90 deg Swimmers on foam roller 2x10 Squats x5 for functional activity after posterior glides at ankles  Self Care: Discussed neurofascial release as an effective form of treatment for pt specific impairments     [facility] Adult PT Treatment:                                                DATE: 11/18/23 Therapeutic Exercise: Seated HS stretch 2 x 30 sec Gastroc stretch 2 x 30 sec Nustep level 4 for 5 min - SPT present to discuss pt status Standing AROM shoulder retraction x20  Seated Pulleys Rt shoulder abd 2x10 Sidelying open books x10 each side for increased thoracic spine mobility   Neuromuscular re-ed: SLS 2 x 20 sec bilat with intermittent UE support- left leg more difficult Latt raises in standing x10 using 5 lbs on Matrix Paloff press in standing at Matrix using 5 lbs 2x10 each side for TA activation  Rocker board A/P 2 x 1 min, pt leaning forward with hands on knees for added weight to engage ankle proprioception  Therapeutic Activity: Side lying shoulder ER with 2 lbs dumbell 2x10 each side- provided verbal cues for rot cuff muscle neuromuscular reed  Serratus punches in supine using 2 lbs 2x10 each side                                                                                                                                  [facility] Adult PT Treatment:                                                DATE: 11/11/23 Therapeutic Exercise: Seated HS stretch 2 x 30 sec Gastroc stretch 2 x 30 sec  Neuromuscular re-ed: SLS 2 x 20 sec bilat with intermittent UE support Tandem stance row green TB x 20 Rocker board A/P 2 x 1 min Bilat ER  green TB Horizontal abd green TB Scaption x 10 bilat Therapeutic Activity: Seated PF black TB x 20 bilat Great toe ext x 20 bilat Wall slide flexion Wall slide abduction   [facility] Adult PT Treatment:  DATE: 11/05/23 Therapeutic Exercise: Demonstrated,performed, and issued initial HEP.     PATIENT EDUCATION:  Education details: see treatment  Person educated: Patient Education method: Explanation, Demonstration, Tactile cues, Verbal cues, and Handouts Education comprehension: verbalized understanding, returned demonstration, verbal cues required, tactile cues required, and needs further education  HOME EXERCISE PROGRAM: Access Code: [email or web address] URL: [email or web address] Date: 11/05/2023 Prepared by: Lucie Meeter  Exercises - Seated Hamstring Stretch  - 1 x daily - 7 x weekly - 3 sets - 30 sec  hold - Gastroc Stretch on Wall  - 1 x daily - 7 x weekly - 3 sets - 30 sec  hold - Seated Arch Lifts  - 1 x daily - 7 x weekly - 2 sets - 10 reps - Seated Great Toe Extension  - 1 x daily - 7 x weekly - 2 sets - 10 reps - Shoulder Flexion Wall Slide with Towel  - 1 x daily - 7 x weekly - 2 sets - 10 reps  ASSESSMENT:  CLINICAL IMPRESSION:  Today's session targeted shoulder mobility and strengthening to promote shoulder stability Rt>Lt . Pt's right shoulder ROM was reassessed in abduction showing 95 degrees, meeting her short term goal. However, pt still demonstrates muscle tightness in upper traps bilaterally during should retraction. She did well with new exercises including  gliding and shearing using MELT Method to lower legs, to promote neurofascial tissue release in lower legs. Pt will benefit from continuing skilled therapy to address the deficits below and move closer to her goal related activities.   OBJECTIVE IMPAIRMENTS: Abnormal gait, decreased activity tolerance, decreased balance, decreased endurance, decreased knowledge  of condition, difficulty walking, decreased ROM, decreased strength, increased fascial restrictions, impaired flexibility, impaired UE functional use, improper body mechanics, postural dysfunction, and pain.      GOALS: Goals reviewed with patient? Yes  SHORT TERM GOALS: Target date: 12/03/2023   Patient will be independent and compliant with initial HEP.   Baseline: initial HEP issued  Goal status: Progressing   2.  Patient will improve hamstring flexibility by at least 10 degrees bilaterally to improve gait stride.  Baseline: see above Goal status: INITIAL  3.  Patient will demonstrate at least neutral ankle DF AROM to improve gait mechanics.  Baseline: see above  Goal status: Progressing   4.  Patient will demonstrate at least 90 degrees of Rt shoulder abduction AROM to improve ability to complete reaching activity.  Baseline: see above  Goal status: MET on 11/20/23 95 deg    LONG TERM GOALS: Target date: 01/03/24  Patient will maintain SLS for at least 5 seconds to improve stability when navigating on uneven terrain.  Baseline: see above  Goal status: INITIAL  2.  Patient will demonstrate 5/5 bilateral ankle strength to improve gait stability.  Baseline: see above Goal status: INITIAL  3.  Patient will demonstrate at least 160 degrees of Rt shoulder flexion AROM to improve ability to complete reaching activity.  Baseline: see above Goal status: INITIAL  4.  Patient will score at least a 4 on the PSFS to signify clinically meaningful improvement in functional abilities.  Baseline: 1 Goal status: INITIAL  5.  Patient will report at least a 50% improvement in pain to improve her ability to perform ADLs.  Baseline: 0 Goal status: INITIAL  6.  Patient will be independent with advanced home program to progress/maintain current level of function.  Baseline: initial HEP issued  Goal status: INITIAL   PLAN:  PT FREQUENCY: 1-2x/week  PT DURATION: 8 weeks  PLANNED  INTERVENTIONS: 97164- PT Re-evaluation, 97750- Physical Performance Testing, 97110-Therapeutic exercises, 97530- Therapeutic activity, 97112- Neuromuscular re-education, [phone]- Self Care, 02859- Manual therapy, [phone]- Gait training, [phone]- Aquatic Therapy, [phone] (1-2 muscles), [phone] (3+ muscles)- Dry Needling, Taping, Cryotherapy, and Moist heat  PLAN FOR NEXT SESSION: UPDATE HEP hamstring and calf stretching; ankle mobilizations; consider arch taping, foot intrinsic strengthening; shoulder AAROM, shoulder isometrics, postural strengthening, thoracic spine mobility   Lavanda Cleverly, SPT 11/20/23 4:56 PM   Lavanda Cleverly, SPT 11/20/23 4:56 PM

## 2023-11-23 ENCOUNTER — Telehealth: Admitting: Physician Assistant

## 2023-11-23 DIAGNOSIS — R399 Unspecified symptoms and signs involving the genitourinary system: Secondary | ICD-10-CM | POA: Diagnosis not present

## 2023-11-23 MED ORDER — NITROFURANTOIN MONOHYD MACRO 100 MG PO CAPS: 100.0000 mg | ORAL_CAPSULE | Freq: Two times a day (BID) | ORAL | 0 refills | Status: AC

## 2023-11-23 NOTE — Progress Notes (Signed)
E-Visit for Urinary Problems  We are sorry that you are not feeling well.  Here is how we plan to help!  Based on what you shared with me it looks like you most likely have a simple urinary tract infection.  A UTI (Urinary Tract Infection) is a bacterial infection of the bladder.  Most cases of urinary tract infections are simple to treat but a key part of your care is to encourage you to drink plenty of fluids and watch your symptoms carefully.  I have prescribed MacroBid 100 mg twice a day for 5 days.  Your symptoms should gradually improve. Call us if the burning in your urine worsens, you develop worsening fever, back pain or pelvic pain or if your symptoms do not resolve after completing the antibiotic.  Urinary tract infections can be prevented by drinking plenty of water to keep your body hydrated.  Also be sure when you wipe, wipe from front to back and don't hold it in!  If possible, empty your bladder every 4 hours.  HOME CARE Drink plenty of fluids Compete the full course of the antibiotics even if the symptoms resolve Remember, when you need to go.go. Holding in your urine can increase the likelihood of getting a UTI! GET HELP RIGHT AWAY IF: You cannot urinate You get a high fever Worsening back pain occurs You see blood in your urine You feel sick to your stomach or throw up You feel like you are going to pass out  MAKE SURE YOU  Understand these instructions. Will watch your condition. Will get help right away if you are not doing well or get worse.   Thank you for choosing an e-visit.  Your e-visit answers were reviewed by a board certified advanced clinical practitioner to complete your personal care plan. Depending upon the condition, your plan could have included both over the counter or prescription medications.  Please review your pharmacy choice. Make sure the pharmacy is open so you can pick up prescription now. If there is a problem, you may contact your  provider through MyChart messaging and have the prescription routed to another pharmacy.  Your safety is important to us. If you have drug allergies check your prescription carefully.   For the next 24 hours you can use MyChart to ask questions about today's visit, request a non-urgent call back, or ask for a work or school excuse. You will get an email in the next two days asking about your experience. I hope that your e-visit has been valuable and will speed your recovery.   I have spent 5 minutes in review of e-visit questionnaire, review and updating patient chart, medical decision making and response to patient.   Adea Geisel Z Ward, PA-C    

## 2023-11-25 ENCOUNTER — Ambulatory Visit: Payer: Self-pay | Admitting: Physical Therapy

## 2023-11-25 ENCOUNTER — Encounter: Payer: Self-pay | Admitting: Physical Therapy

## 2023-11-25 DIAGNOSIS — M25511 Pain in right shoulder: Secondary | ICD-10-CM

## 2023-11-25 DIAGNOSIS — R2689 Other abnormalities of gait and mobility: Secondary | ICD-10-CM | POA: Diagnosis not present

## 2023-11-25 DIAGNOSIS — S46011A Strain of muscle(s) and tendon(s) of the rotator cuff of right shoulder, initial encounter: Secondary | ICD-10-CM | POA: Diagnosis not present

## 2023-11-25 DIAGNOSIS — G8929 Other chronic pain: Secondary | ICD-10-CM

## 2023-11-25 DIAGNOSIS — M6281 Muscle weakness (generalized): Secondary | ICD-10-CM | POA: Diagnosis not present

## 2023-11-25 DIAGNOSIS — R29898 Other symptoms and signs involving the musculoskeletal system: Secondary | ICD-10-CM | POA: Diagnosis not present

## 2023-11-25 DIAGNOSIS — M25571 Pain in right ankle and joints of right foot: Secondary | ICD-10-CM | POA: Diagnosis not present

## 2023-11-25 DIAGNOSIS — M25871 Other specified joint disorders, right ankle and foot: Secondary | ICD-10-CM | POA: Diagnosis not present

## 2023-11-25 NOTE — Therapy (Signed)
 OUTPATIENT PHYSICAL THERAPY LOWER EXTREMITY TREATMENT   Patient Name: Chelsea [name] MRN: [id number] DOB:1985/09/02, 38 y.o., female Today's Date: 11/25/2023  END OF SESSION:  PT End of Session - 11/25/23 1623     Visit Number 5    Number of Visits 17    Date for Recertification  01/03/24    Authorization Type [employer]- 30 visits per year - 22 remaining    PT Start Time 0330    PT Stop Time 0415    PT Time Calculation (min) 45 min    Activity Tolerance Patient tolerated treatment well    Behavior During Therapy WFL for tasks assessed/performed             Past Medical History:  Diagnosis Date   Depression    Endometrial polyp    Fibromyalgia    History of recurrent UTIs    Hypertension    Infertility, anovulation    Myofascial muscle pain    OA (osteoarthritis) of knee    BILATERAL   PCOS (polycystic ovarian syndrome)    PONV (postoperative nausea and vomiting)    Seasonal allergic rhinitis    Past Surgical History:  Procedure Laterality Date   CHOLECYSTECTOMY N/A 07/11/2021   Procedure: LAPAROSCOPIC CHOLECYSTECTOMY WITH INTRAOPERATIVE CHOLANGIOGRAM;  Surgeon: Belinda Cough, MD;  Location: [facility];  Service: General;  Laterality: N/A;   HYSTEROSCOPY WITH D & C N/A 09/07/2014   Procedure:  DILATATION AND CURETTAGE /HYSTEROSCOPY/POLYPECTOMY;  Surgeon: Cynthia Loss, MD;  Location: [facility];  Service: Gynecology;  Laterality: N/A;   HYSTEROSCOPY WITH D & C N/A 07/25/2021   Procedure: DILATATION AND CURETTAGE /HYSTEROSCOPY;  Surgeon: Cleotilde Ronal RAMAN, MD;  Location: [facility];  Service: Gynecology;  Laterality: N/A;   I & D RIGHT FOOT  1998   LAPAROSCOPIC GASTRIC SLEEVE RESECTION N/A 10/20/2017   Procedure: LAPAROSCOPIC GASTRIC SLEEVE RESECTION, UPPER ENDO, ERAS Pathway;  Surgeon: Signe Mitzie LABOR, MD;  Location: [facility];  Service: General;  Laterality: N/A;   POLYPECTOMY     uterine   TONSILLECTOMY  2001   WISDOM TOOTH EXTRACTION   2004   Patient Active Problem List   Diagnosis Date Noted   Left cervical radiculopathy 07/02/2022   Right carpal tunnel syndrome 04/01/2022   Left hand tendonitis 02/19/2022   Simple endometrial hyperplasia    Right ankle sprain 07/19/2020   Chronic pain syndrome 05/30/2020   Impingement syndrome of both shoulders 08/17/2019   Primary insomnia 05/11/2019   Mixed hypercholesterolemia and hypertriglyceridemia 11/13/2018   Orgasmic headache 11/11/2018   Diet-controlled diabetes mellitus (HCC) 04/25/2017   Lumbar degenerative disc disease 08/24/2015   Intractable migraine without aura and with status migrainosus 06/24/2014   Myofascial pain syndrome with depression 02/08/2014   Venous stasis dermatitis 01/25/2014   Right hip pain 12/13/2013   Metatarsalgia of both feet 10/18/2013   Primary osteoarthritis of both knees 02/13/2012   Essential hypertension, benign 11/25/2008   POLYCYSTIC OVARIAN DISEASE 10/26/2008   History of gastric sleeve 10/26/2008    PCP: none   REFERRING PROVIDER: Charles Redell LABOR, DO  REFERRING DIAG:  S46.011A (ICD-10-CM) - Strain of rotator cuff of right shoulder  M25.571,G89.29 (ICD-10-CM) - Chronic pain of right ankle  M25.871 (ICD-10-CM) - Ankle impingement syndrome, right    THERAPY DIAG:  Chronic right shoulder pain  Muscle weakness (generalized)  Acute pain of right shoulder  Rationale for Evaluation and Treatment: Rehabilitation  ONSET DATE: Rt shoulder May/June 2025, ankle chronic   SUBJECTIVE:  Initial Eval: Patient reports she went to reach for a binder with the RUE in late May/early June and felt a pop in the shoulder and had pain following this movement. The pain has eased off a little bit since initial onset, but she can't reach and lift due to increased pain. She saw Dr. Charles who believes there could be a small rotator cuff tear. She reports off/on ankle pain for years, but the Rt ankle is much worse and was getting to the  point where she was limping when walking and notices the most pain when she tries to push-off on the foot. The pain is about the anterior ankle and dorsal aspect of the foot. She had recent injection that has provided minimal pain relief. Still can't stand and walk for long periods due to ankle pain. She underwent arthroscopic shoulder surgery describing distal clavicle excision and debridement back in 2023. Of note patient underwent IVF implantation yesterday and was instructed to take it easy over the next 5 days.  SUBJECTIVE STATEMENT: Pt reports not being able to rotate her neck, and it's very difficult to drive. She states she got a foam roller after last session, is using it, and it is helping with alleviating pain.   PERTINENT HISTORY: Rt shoulder surgery 2023 Fibromyalgia Rt ankle sprain Per patient history of back surgery  Recent IVF implantation  PAIN:  Are you having pain? Yes: NPRS scale: 8; at worst 8 Pain location: Rt anterior shoulder/upper arm Pain description: sharp, pulling Aggravating factors: lifting, reaching, maintaining arms overhead Relieving factors: stretching, rest   Are you having pain? Yes: NPRS scale:  2/10 currently; at worst 10 Pain location: Rt anterior ankle/dorsum of foot Pain description: sharp, burning Aggravating factors: walking,standing Relieving factors: rest, ice, compression   PRECAUTIONS: Recent IVF implantation   RED FLAGS: None   WEIGHT BEARING RESTRICTIONS: No  FALLS:  Has patient fallen in last 6 months? No  LIVING ENVIRONMENT: Lives with: lives with their family Lives in: House/apartment Stairs: No Has following equipment at home: None  OCCUPATION: filing for disability   PLOF: occasionally needs assistance with bathing  PATIENT GOALS: get my ankle and shoulder to quit hurting.   NEXT MD VISIT: 12/01/23  OBJECTIVE:  Note: Objective measures were completed at Evaluation unless otherwise noted.  DIAGNOSTIC FINDINGS: no  recent imaging   PATIENT SURVEYS:  Patient-specific activity scoring scheme (Point to one number):  0 represents "unable to perform." 10 represents "able to perform at prior level. 0 1 2 3 4 5 6 7 8 9  10 (Date and Score) Activity Initial  Activity Eval     Standing > 10 minutes 0     Hairstyling   0    Light household activities  3    Additional Additional Total score = sum of the activity scores/number of activities Minimum detectable change (90%CI) for average score = 2 points Minimum detectable change (90%CI) for single activity score = 3 points PSFS developed by: Rosalee MYRTIS Marvis KYM Charlet CHRISTELLA., & Binkley, J. (1995). Assessing disability and change on individual  patients: a report of a patient specific measure. Physiotherapy [place], 47, [phone]. Reproduced with the permission of the authors  Score: 1  COGNITION: Overall cognitive status: Within functional limits for tasks assessed     SENSATION: WFL  EDEMA:  No obvious swelling   MUSCLE LENGTH: Hamstrings: lacking 50 degrees bilaterally    POSTURE: rounded shoulders, forward head, and genu valgum, pes planus   PALPATION: Diffuse tenderness  about Rt shoulder  UPPER EXTREMITY MMT:  MMT Right eval Left eval  Shoulder flexion 4+   Shoulder scaption  4   Shoulder abduction 4+   Shoulder adduction    Shoulder extension    Shoulder internal rotation 5   Shoulder external rotation 4+   Middle trapezius    Lower trapezius    Elbow flexion    Elbow extension    Wrist flexion 5   Wrist extension    Wrist ulnar deviation    Wrist radial deviation    Wrist pronation    Wrist supination    Grip strength     (Blank rows = not tested); pain with all shoulder MMT UPPER EXTREMITY ROM:  Active ROM Right eval Left eval 11/20/23 Right  Shoulder flexion 125 standing; pain     Shoulder extension     Shoulder abduction 70 standing; pain   95 deg No pain  Shoulder adduction     Shoulder extension      Shoulder internal rotation WNL    Shoulder external rotation WNL    Elbow flexion     Elbow extension     Wrist flexion     Wrist extension     Wrist ulnar deviation     Wrist radial deviation     Wrist pronation     Wrist supination      (Blank rows = not tested)  LOWER EXTREMITY ROM:  Active ROM Right eval Left eval 11/20/23  Hip flexion     Hip extension     Hip abduction     Hip adduction     Hip internal rotation     Hip external rotation     Knee flexion     Knee extension     Ankle dorsiflexion Lacking 5  Lacking 6  Rt: lacking 3  Ankle plantarflexion WNL WNL   Ankle inversion WNL WNL   Ankle eversion WNL WNL    (Blank rows = not tested)  LOWER EXTREMITY MMT:  MMT Right eval Left eval  Hip flexion    Hip extension    Hip abduction    Hip adduction    Hip internal rotation    Hip external rotation    Knee flexion    Knee extension    Ankle dorsiflexion 4 4  Ankle plantarflexion Partial range DL calf raise  Partial range DL calf raise  Ankle inversion 4+ 4+  Ankle eversion 4+ 4+   (Blank rows = not tested)  SPECIAL TESTS:  (+) Empty can   FUNCTIONAL TESTS:  SLS: 2 seconds bilaterally   GAIT: Distance walked: 20 ft  Assistive device utilized: None Level of assistance: Complete Independence Comments: foot ER, decreased heel strike, decreased push-off [facility] Adult PT Treatment:                                                DATE: 11/25/23 Therapeutic Exercise: Seated hamstring strech 30 sec each side  Standing open books x10 each side  Standing pec stretch 2x 30 sec  Piriformis stretch in seated 30 sec each side   Neuromuscular re-ed: MELT method to cervical neck gliding flex/ext x20 and shearing side to side x20 and bilateral scapulas mid thoracic x20  to promote neurofascial tissue release  SNAGS in seated x20 extension, left rot x10, right rotation x10 Self Care:  Reviewed initial HEP    [facility] Adult PT Treatment:                                                 DATE: 11/20/23 Therapeutic Exercise: Nustep level 5 for 5 min- SPT present to discuss pt status AROM shoulder retraction in seated x10  Sidelying open books x10 each side for thoracic spine and pec mobility Reassessment of right shoulder abduction and right ankle dorsiflexion Manual Therapy: STM to bil upper traps, lev scap, rotator cuff muscles finding increased muscle tightness Posterior glides to right ankle using Maitland scale grade 4 to increase ankle dorsiflexion motion  Neuromuscular re-ed: MELT method to bilateral calves: gliding and shearing 3x to 3 regions of lower leg to promote neurofascial tissue release  Rockerboard ant/post x20 for increased ankle proprioception Therapeutic Activity: Snow angels on MELT method foam roller x10- right arm only 90 deg Swimmers on foam roller 2x10 Squats x5 for functional activity after posterior glides at ankles  Self Care: Discussed neurofascial release as an effective form of treatment for pt specific impairments     [facility] Adult PT Treatment:                                                DATE: 11/18/23 Therapeutic Exercise: Seated HS stretch 2 x 30 sec Gastroc stretch 2 x 30 sec Nustep level 4 for 5 min - SPT present to discuss pt status Standing AROM shoulder retraction x20  Seated Pulleys Rt shoulder abd 2x10 Sidelying open books x10 each side for increased thoracic spine mobility   Neuromuscular re-ed: SLS 2 x 20 sec bilat with intermittent UE support- left leg more difficult Latt raises in standing x10 using 5 lbs on Matrix Paloff press in standing at Matrix using 5 lbs 2x10 each side for TA activation  Rocker board A/P 2 x 1 min, pt leaning forward with hands on knees for added weight to engage ankle proprioception  Therapeutic Activity: Side lying shoulder ER with 2 lbs dumbell 2x10 each side- provided verbal cues for rot cuff muscle neuromuscular reed  Serratus punches in supine using 2 lbs 2x10 each  side                                                                                                                                  [facility] Adult PT Treatment:                                                DATE: 11/11/23  Therapeutic Exercise: Seated HS stretch 2 x 30 sec Gastroc stretch 2 x 30 sec  Neuromuscular re-ed: SLS 2 x 20 sec bilat with intermittent UE support Tandem stance row green TB x 20 Rocker board A/P 2 x 1 min Bilat ER green TB Horizontal abd green TB Scaption x 10 bilat Therapeutic Activity: Seated PF black TB x 20 bilat Great toe ext x 20 bilat Wall slide flexion Wall slide abduction   [facility] Adult PT Treatment:                                                DATE: 11/05/23 Therapeutic Exercise: Demonstrated,performed, and issued initial HEP.     PATIENT EDUCATION:  Education details: see treatment  Person educated: Patient Education method: Explanation, Demonstration, Tactile cues, Verbal cues, and Handouts Education comprehension: verbalized understanding, returned demonstration, verbal cues required, tactile cues required, and needs further education  HOME EXERCISE PROGRAM: Access Code: [email or web address] URL: [email or web address] Date: 11/25/2023 Prepared by: Darice Conine Exercises  - Seated Hamstring Stretch  - 1 x daily - 7 x weekly - 3 sets - 30 sec  hold  - Gastroc Stretch on Wall  - 1 x daily - 7 x weekly - 3 sets - 30 sec  hold  - Seated Arch Lifts  - 1 x daily - 7 x weekly - 2 sets - 10 reps  - Seated Great Toe Extension  - 1 x daily - 7 x weekly - 2 sets - 10 reps  - Shoulder Flexion Wall Slide with Towel  - 1 x daily - 7 x weekly - 2 sets - 10 reps  - Seated Assisted Cervical Rotation with Towel  - 1 x daily - 7 x weekly - 3 sets - 10 reps  - Cervical Extension AROM with Strap  - 1 x daily - 7 x weekly - 3 sets - 10 reps  - Standing with Forearms Thoracic Rotation  - 1 x daily - 7 x weekly - 3 sets - 10 reps  - Seated Piriformis Stretch   - 1 x daily - 7 x weekly - 1 sets - 3 reps - 20-30 seconds hold  - Neck Mobilization with Foam Roller  - 1 x daily - 7 x weekly - 3 sets - 10 rep   ASSESSMENT:  CLINICAL IMPRESSION: Ms Stantz presents to skilled therapy with complaints of neck pain, esp with cervical rotation. Session today focused on cervical mobility and thoracic spinal mobility to decrease pain and tissue restriction. Pt stated feeling better after cervical SNAGs in rotation, and pain relief after performing the MELT method on her neck. Pt demonstrated increased shoulder mobility after the pec stretches, and decreased pain in the right shoulder.Pt will benefit from continuing skilled PT to address the deficits below, and is scheduled to see her doctor Oct 6.    OBJECTIVE IMPAIRMENTS: Abnormal gait, decreased activity tolerance, decreased balance, decreased endurance, decreased knowledge of condition, difficulty walking, decreased ROM, decreased strength, increased fascial restrictions, impaired flexibility, impaired UE functional use, improper body mechanics, postural dysfunction, and pain.      GOALS: Goals reviewed with patient? Yes  SHORT TERM GOALS: Target date: 12/03/2023   Patient will be independent and compliant with initial HEP.   Baseline: initial HEP issued  Goal status:MET on 11/25/23  2.  Patient will improve hamstring flexibility by at  least 10 degrees bilaterally to improve gait stride.  Baseline: see above Goal status: INITIAL  3.  Patient will demonstrate at least neutral ankle DF AROM to improve gait mechanics.  Baseline: see above  Goal status: Progressing   4.  Patient will demonstrate at least 90 degrees of Rt shoulder abduction AROM to improve ability to complete reaching activity.  Baseline: see above  Goal status: MET on 11/20/23 95 deg    LONG TERM GOALS: Target date: 01/03/24  Patient will maintain SLS for at least 5 seconds to improve stability when navigating on uneven terrain.   Baseline: see above  Goal status: INITIAL  2.  Patient will demonstrate 5/5 bilateral ankle strength to improve gait stability.  Baseline: see above Goal status: INITIAL  3.  Patient will demonstrate at least 160 degrees of Rt shoulder flexion AROM to improve ability to complete reaching activity.  Baseline: see above Goal status: INITIAL  4.  Patient will score at least a 4 on the PSFS to signify clinically meaningful improvement in functional abilities.  Baseline: 1 Goal status: INITIAL  5.  Patient will report at least a 50% improvement in pain to improve her ability to perform ADLs.  Baseline: 0 Goal status: INITIAL  6.  Patient will be independent with advanced home program to progress/maintain current level of function.  Baseline: initial HEP issued  Goal status: INITIAL   PLAN:  PT FREQUENCY: 1-2x/week  PT DURATION: 8 weeks  PLANNED INTERVENTIONS: 02835- PT Re-evaluation, 97750- Physical Performance Testing, 97110-Therapeutic exercises, 97530- Therapeutic activity, [id number]- Neuromuscular re-education, 97535- Self Care, 02859- Manual therapy, [phone]- Gait training, [phone]- Aquatic Therapy, [phone] (1-2 muscles), [phone] (3+ muscles)- Dry Needling, Taping, Cryotherapy, and Moist heat  PLAN FOR NEXT SESSION: UPDATE HEP hamstring and calf stretching; ankle mobilizations; consider arch taping, foot intrinsic strengthening; shoulder AAROM, shoulder isometrics, postural strengthening, thoracic spine mobility, Do reassessment measurements since pt is going to pt oct 6 since pt still having shoulder pain    Lavanda Cleverly, SPT 11/25/23 4:24 PM

## 2023-11-27 ENCOUNTER — Ambulatory Visit: Payer: Self-pay

## 2023-11-27 DIAGNOSIS — M6281 Muscle weakness (generalized): Secondary | ICD-10-CM | POA: Diagnosis not present

## 2023-11-27 DIAGNOSIS — M25571 Pain in right ankle and joints of right foot: Secondary | ICD-10-CM | POA: Diagnosis not present

## 2023-11-27 DIAGNOSIS — G8929 Other chronic pain: Secondary | ICD-10-CM | POA: Diagnosis not present

## 2023-11-27 DIAGNOSIS — R2689 Other abnormalities of gait and mobility: Secondary | ICD-10-CM | POA: Insufficient documentation

## 2023-11-27 DIAGNOSIS — M25511 Pain in right shoulder: Secondary | ICD-10-CM | POA: Insufficient documentation

## 2023-11-27 NOTE — Therapy (Signed)
 OUTPATIENT PHYSICAL THERAPY LOWER EXTREMITY TREATMENT PHYSICAL THERAPY DISCHARGE SUMMARY  Visits from Start of Care: 6  Current functional level related to goals / functional outcomes: See goals below   Remaining deficits: See impression below   Education / Equipment: See education below    Patient agrees to discharge. Patient goals were partially met. Patient is being discharged due to patient's request to f/u with MD for shoulder, pleased with progress of ankles.   Patient Name: Chelsea [name] MRN: [id number] DOB:01/22/1986, 38 y.o., female Today's Date: 11/27/2023  END OF SESSION:  PT End of Session - 11/27/23 1449     Visit Number 6    Number of Visits 17    Date for Recertification  01/03/24    Authorization Type [employer]- 30 visits per year - 22 remaining    Authorization - Visit Number 5    Authorization - Number of Visits 5    PT Start Time 1449    PT Stop Time 1528    PT Time Calculation (min) 39 min    Activity Tolerance Patient tolerated treatment well    Behavior During Therapy WFL for tasks assessed/performed             Past Medical History:  Diagnosis Date   Depression    Endometrial polyp    Fibromyalgia    History of recurrent UTIs    Hypertension    Infertility, anovulation    Myofascial muscle pain    OA (osteoarthritis) of knee    BILATERAL   PCOS (polycystic ovarian syndrome)    PONV (postoperative nausea and vomiting)    Seasonal allergic rhinitis    Past Surgical History:  Procedure Laterality Date   CHOLECYSTECTOMY N/A 07/11/2021   Procedure: LAPAROSCOPIC CHOLECYSTECTOMY WITH INTRAOPERATIVE CHOLANGIOGRAM;  Surgeon: Belinda Cough, MD;  Location: [facility];  Service: General;  Laterality: N/A;   HYSTEROSCOPY WITH D & C N/A 09/07/2014   Procedure:  DILATATION AND CURETTAGE /HYSTEROSCOPY/POLYPECTOMY;  Surgeon: Cynthia Loss, MD;  Location: [facility];  Service: Gynecology;  Laterality: N/A;   HYSTEROSCOPY WITH D & C N/A  07/25/2021   Procedure: DILATATION AND CURETTAGE /HYSTEROSCOPY;  Surgeon: Cleotilde Ronal RAMAN, MD;  Location: [facility];  Service: Gynecology;  Laterality: N/A;   I & D RIGHT FOOT  1998   LAPAROSCOPIC GASTRIC SLEEVE RESECTION N/A 10/20/2017   Procedure: LAPAROSCOPIC GASTRIC SLEEVE RESECTION, UPPER ENDO, ERAS Pathway;  Surgeon: Signe Mitzie LABOR, MD;  Location: [facility];  Service: General;  Laterality: N/A;   POLYPECTOMY     uterine   TONSILLECTOMY  2001   WISDOM TOOTH EXTRACTION  2004   Patient Active Problem List   Diagnosis Date Noted   Left cervical radiculopathy 07/02/2022   Right carpal tunnel syndrome 04/01/2022   Left hand tendonitis 02/19/2022   Simple endometrial hyperplasia    Right ankle sprain 07/19/2020   Chronic pain syndrome 05/30/2020   Impingement syndrome of both shoulders 08/17/2019   Primary insomnia 05/11/2019   Mixed hypercholesterolemia and hypertriglyceridemia 11/13/2018   Orgasmic headache 11/11/2018   Diet-controlled diabetes mellitus (HCC) 04/25/2017   Lumbar degenerative disc disease 08/24/2015   Intractable migraine without aura and with status migrainosus 06/24/2014   Myofascial pain syndrome with depression 02/08/2014   Venous stasis dermatitis 01/25/2014   Right hip pain 12/13/2013   Metatarsalgia of both feet 10/18/2013   Primary osteoarthritis of both knees 02/13/2012   Essential hypertension, benign 11/25/2008   POLYCYSTIC OVARIAN DISEASE 10/26/2008   History  of gastric sleeve 10/26/2008    PCP: none   REFERRING PROVIDER: Charles Redell LABOR, DO  REFERRING DIAG:  S46.011A (ICD-10-CM) - Strain of rotator cuff of right shoulder  M25.571,G89.29 (ICD-10-CM) - Chronic pain of right ankle  M25.871 (ICD-10-CM) - Ankle impingement syndrome, right    THERAPY DIAG:  Chronic right shoulder pain  Muscle weakness (generalized)  Pain in right ankle and joints of right foot  Other abnormalities of gait and mobility  Rationale for  Evaluation and Treatment: Rehabilitation  ONSET DATE: Rt shoulder May/June 2025, ankle chronic   SUBJECTIVE:   Initial Eval: Patient reports she went to reach for a binder with the RUE in late May/early June and felt a pop in the shoulder and had pain following this movement. The pain has eased off a little bit since initial onset, but she can't reach and lift due to increased pain. She saw Dr. Charles who believes there could be a small rotator cuff tear. She reports off/on ankle pain for years, but the Rt ankle is much worse and was getting to the point where she was limping when walking and notices the most pain when she tries to push-off on the foot. The pain is about the anterior ankle and dorsal aspect of the foot. She had recent injection that has provided minimal pain relief. Still can't stand and walk for long periods due to ankle pain. She underwent arthroscopic shoulder surgery describing distal clavicle excision and debridement back in 2023. Of note patient underwent IVF implantation yesterday and was instructed to take it easy over the next 5 days.  SUBJECTIVE STATEMENT: Pt reports her right shoulder still pulls, having to rely more on her left shoulder to do tasks. Pt states she feels like her right shoulder is not getting better and is considering today being her last day until she returns back to doctor for an updated consultation. However pt states both her ankles are stronger, more stable, and have decreased pain since starting PT.   PERTINENT HISTORY: Rt shoulder surgery 2023 Fibromyalgia Rt ankle sprain Per patient history of back surgery  Recent IVF implantation  PAIN:  Are you having pain? Yes: NPRS scale: 5; at worst 8 Pain location: Rt anterior shoulder/upper arm Pain description: sharp, pulling Aggravating factors: lifting, reaching, maintaining arms overhead Relieving factors: stretching, rest    PRECAUTIONS: Recent IVF implantation   RED FLAGS: None   WEIGHT  BEARING RESTRICTIONS: No  FALLS:  Has patient fallen in last 6 months? No  LIVING ENVIRONMENT: Lives with: lives with their family Lives in: House/apartment Stairs: No Has following equipment at home: None  OCCUPATION: filing for disability   PLOF: occasionally needs assistance with bathing  PATIENT GOALS: get my ankle and shoulder to quit hurting.   NEXT MD VISIT: 12/01/23  OBJECTIVE:  Note: Objective measures were completed at Evaluation unless otherwise noted.  DIAGNOSTIC FINDINGS: no recent imaging   PATIENT SURVEYS:  Patient-specific activity scoring scheme (Point to one number):  0 represents "unable to perform." 10 represents "able to perform at prior level. 0 1 2 3 4 5 6 7 8 9  10 (Date and Score) Activity Initial  Activity Eval   11/27/23  Standing > 10 minutes 0   4  Hairstyling   0  4  Light household activities  3 4   Additional Additional Total score = sum of the activity scores/number of activities Minimum detectable change (90%CI) for average score = 2 points Minimum detectable change (90%CI) for single  activity score = 3 points PSFS developed by: Rosalee MYRTIS Marvis KYM Charlet CHRISTELLA., & Waylon PARAS. (1995). Assessing disability and change on individual  patients: a report of a patient specific measure. Physiotherapy [place], 47, [phone]. Reproduced with the permission of the authors  Score: 1  11/27/23:4  COGNITION: Overall cognitive status: Within functional limits for tasks assessed     SENSATION: WFL  EDEMA:  No obvious swelling   MUSCLE LENGTH: Hamstrings: lacking 50 degrees bilaterally   11/27/23: hamstring: Mu:ojrxpwh 40 Lt: lacking 35    POSTURE: rounded shoulders, forward head, and genu valgum, pes planus   PALPATION: Diffuse tenderness about Rt shoulder  UPPER EXTREMITY MMT:  MMT Right eval Right 11/27/23     Shoulder flexion 4+ 4+    Shoulder scaption  4 4    Shoulder abduction 4+ 4-    Shoulder adduction       Shoulder extension      Shoulder internal rotation 5 5    Shoulder external rotation 4+ 5    Middle trapezius      Lower trapezius      Elbow flexion      Elbow extension      Wrist flexion 5 4 *    Wrist extension      Wrist ulnar deviation      Wrist radial deviation      Wrist pronation      Wrist supination      Grip strength       (Blank rows = not tested); pain with all shoulder MMT UPPER EXTREMITY ROM:  Active ROM Right eval Left eval 11/20/23 Right 11/27/23 Right  Shoulder flexion 125 standing; pain    116 Supine pain  Shoulder extension      Shoulder abduction 70 standing; pain   95 deg No pain PROM 85 deg pain  Shoulder adduction      Shoulder extension      Shoulder internal rotation WNL     Shoulder external rotation WNL     Elbow flexion      Elbow extension      Wrist flexion      Wrist extension      Wrist ulnar deviation      Wrist radial deviation      Wrist pronation      Wrist supination       (Blank rows = not tested)  LOWER EXTREMITY ROM:  Active ROM Right eval Left eval 11/20/23 11/27/23   Hip flexion      Hip extension      Hip abduction      Hip adduction      Hip internal rotation      Hip external rotation      Knee flexion      Knee extension      Ankle dorsiflexion Lacking 5  Lacking 6  Rt: lacking 3 Rt: Neutral; Lt: 1 deg  Ankle plantarflexion WNL WNL    Ankle inversion WNL WNL    Ankle eversion WNL WNL     (Blank rows = not tested)  LOWER EXTREMITY MMT:  MMT Right eval Left eval Right  11/27/23 Left  11/27/23  Hip flexion      Hip extension      Hip abduction      Hip adduction      Hip internal rotation      Hip external rotation      Knee flexion      Knee extension  Ankle dorsiflexion 4 4 4+ 4+  Ankle plantarflexion Partial range DL calf raise  Partial range DL calf raise 4+ 4+  Ankle inversion 4+ 4+ 4+ 4+  Ankle eversion 4+ 4+ 4+ 4+   (Blank rows = not tested)  SPECIAL TESTS:  (+) Empty can  (-)  Empty can on 11/27/23 FUNCTIONAL TESTS:  SLS: 2 seconds bilaterally  SLS 11/27/23: Right foot 7 sec, Left foot 17 sec  GAIT: Distance walked: 20 ft  Assistive device utilized: None Level of assistance: Complete Independence Comments: foot ER, decreased heel strike, decreased push-off  [facility] Adult PT Treatment:                                                DATE: 11/27/23 Therapeutic Exercise: Nustep on level 5 for 6 min - SPT present to discuss pt status  Reviewed final HEP recommending pt continue exercises   Therapeutic Activity: Reassessment to determine overall progress toward goals and how it relates to functionality  Self Care: Recommended pt return to doctor to discuss current status of right shoulder for further consultation    [facility] Adult PT Treatment:                                                DATE: 11/25/23 Therapeutic Exercise: Seated hamstring strech 30 sec each side  Standing open books x10 each side  Standing pec stretch 2x 30 sec  Piriformis stretch in seated 30 sec each side   Neuromuscular re-ed: MELT method to cervical neck gliding flex/ext x20 and shearing side to side x20 and bilateral scapulas mid thoracic x20  to promote neurofascial tissue release  SNAGS in seated x20 extension, left rot x10, right rotation x10 Self Care: Reviewed initial HEP    [facility] Adult PT Treatment:                                                DATE: 11/20/23 Therapeutic Exercise: Nustep level 5 for 5 min- SPT present to discuss pt status AROM shoulder retraction in seated x10  Sidelying open books x10 each side for thoracic spine and pec mobility Reassessment of right shoulder abduction and right ankle dorsiflexion Manual Therapy: STM to bil upper traps, lev scap, rotator cuff muscles finding increased muscle tightness Posterior glides to right ankle using Maitland scale grade 4 to increase ankle dorsiflexion motion  Neuromuscular re-ed: MELT method to bilateral calves: gliding  and shearing 3x to 3 regions of lower leg to promote neurofascial tissue release  Rockerboard ant/post x20 for increased ankle proprioception Therapeutic Activity: Snow angels on MELT method foam roller x10- right arm only 90 deg Swimmers on foam roller 2x10 Squats x5 for functional activity after posterior glides at ankles  Self Care: Discussed neurofascial release as an effective form of treatment for pt specific impairments     [facility] Adult PT Treatment:  DATE: 11/18/23 Therapeutic Exercise: Seated HS stretch 2 x 30 sec Gastroc stretch 2 x 30 sec Nustep level 4 for 5 min - SPT present to discuss pt status Standing AROM shoulder retraction x20  Seated Pulleys Rt shoulder abd 2x10 Sidelying open books x10 each side for increased thoracic spine mobility   Neuromuscular re-ed: SLS 2 x 20 sec bilat with intermittent UE support- left leg more difficult Latt raises in standing x10 using 5 lbs on Matrix Paloff press in standing at Matrix using 5 lbs 2x10 each side for TA activation  Rocker board A/P 2 x 1 min, pt leaning forward with hands on knees for added weight to engage ankle proprioception  Therapeutic Activity: Side lying shoulder ER with 2 lbs dumbell 2x10 each side- provided verbal cues for rot cuff muscle neuromuscular reed  Serratus punches in supine using 2 lbs 2x10 each side                                                                                                                                  [facility] Adult PT Treatment:                                                DATE: 11/11/23 Therapeutic Exercise: Seated HS stretch 2 x 30 sec Gastroc stretch 2 x 30 sec  Neuromuscular re-ed: SLS 2 x 20 sec bilat with intermittent UE support Tandem stance row green TB x 20 Rocker board A/P 2 x 1 min Bilat ER green TB Horizontal abd green TB Scaption x 10 bilat Therapeutic Activity: Seated PF black TB x 20 bilat Great toe ext x  20 bilat Wall slide flexion Wall slide abduction   [facility] Adult PT Treatment:                                                DATE: 11/05/23 Therapeutic Exercise: Demonstrated,performed, and issued initial HEP.     PATIENT EDUCATION:  Education details: see treatment  Person educated: Patient Education method: Explanation Education comprehension: verbalized understanding  HOME EXERCISE PROGRAM: Access Code: [email or web address] URL: [email or web address] Date: 11/25/2023 Prepared by: Darice Conine Exercises  - Seated Hamstring Stretch  - 1 x daily - 7 x weekly - 3 sets - 30 sec  hold  - Gastroc Stretch on Wall  - 1 x daily - 7 x weekly - 3 sets - 30 sec  hold  - Seated Arch Lifts  - 1 x daily - 7 x weekly - 2 sets - 10 reps  - Seated Great Toe Extension  - 1 x daily - 7 x weekly - 2 sets - 10 reps  - Shoulder Flexion  Wall Slide with Towel  - 1 x daily - 7 x weekly - 2 sets - 10 reps  - Seated Assisted Cervical Rotation with Towel  - 1 x daily - 7 x weekly - 3 sets - 10 reps  - Cervical Extension AROM with Strap  - 1 x daily - 7 x weekly - 3 sets - 10 reps  - Standing with Forearms Thoracic Rotation  - 1 x daily - 7 x weekly - 3 sets - 10 reps  - Seated Piriformis Stretch  - 1 x daily - 7 x weekly - 1 sets - 3 reps - 20-30 seconds hold  - Neck Mobilization with Foam Roller  - 1 x daily - 7 x weekly - 3 sets - 10 rep   ASSESSMENT:  CLINICAL IMPRESSION:  Ms Dilmore states her ankle has gotten better since doing PT, however her right shoulder pain remains the same where she feels a consultation with Dr Charles for reassessment is next best plan of action. Today pt demonstrates improved ankle strength in all planes and has increased ankle ROM, meeting her goals. However, pt's shoulder strength maintained similar results from initial evaluation, and shoulder ROM has not improved. Although she has made some shoulder improvements, she still presents with decreased functional capacity  that remains relatively unchanged since the start of care. Patient is appropriate for discharge at this time and has plans to f/u with referring provider next week for ongoing shoulder pain with patient in agreement with this plan.   OBJECTIVE IMPAIRMENTS: Abnormal gait, decreased activity tolerance, decreased balance, decreased endurance, decreased knowledge of condition, difficulty walking, decreased ROM, decreased strength, increased fascial restrictions, impaired flexibility, impaired UE functional use, improper body mechanics, postural dysfunction, and pain.      GOALS: Goals reviewed with patient? Yes  SHORT TERM GOALS: Target date: 12/03/2023   Patient will be independent and compliant with initial HEP.   Baseline: initial HEP issued  Goal status:MET on 11/25/23  2.  Patient will improve hamstring flexibility by at least 10 degrees bilaterally to improve gait stride.  Baseline: see above Goal status: MET on 11/27/23  3.  Patient will demonstrate at least neutral ankle DF AROM to improve gait mechanics.  Baseline: see above  Goal status: MET on 11/27/23  4.  Patient will demonstrate at least 90 degrees of Rt shoulder abduction AROM to improve ability to complete reaching activity.  Baseline: see above  Goal status: MET on 11/20/23 95 deg    LONG TERM GOALS: Target date: 01/03/24  Patient will maintain SLS for at least 5 seconds to improve stability when navigating on uneven terrain.  Baseline: see above  Goal status: MET on 11/27/23  2.  Patient will demonstrate 5/5 bilateral ankle strength to improve gait stability.  Baseline: see above Goal status: Partially met   3.  Patient will demonstrate at least 160 degrees of Rt shoulder flexion AROM to improve ability to complete reaching activity.  Baseline: see above Goal status: Not met   4.  Patient will score at least a 4 on the PSFS to signify clinically meaningful improvement in functional abilities.  Baseline: 1 Goal  status: MET  5.  Patient will report at least a 50% improvement in pain to improve her ability to perform ADLs.  Baseline: 0 11/27/23: 90% improvement ankles, 0% improvement shoulder  Goal status: Partially met   6.  Patient will be independent with advanced home program to progress/maintain current level of function.  Baseline:  initial HEP issued  Goal status: MET on 11/27/23   PLAN:  PT FREQUENCY: 1-2x/week  PT DURATION: 8 weeks  PLANNED INTERVENTIONS: 97164- PT Re-evaluation, 97750- Physical Performance Testing, 97110-Therapeutic exercises, 97530- Therapeutic activity, [id number]- Neuromuscular re-education, 97535- Self Care, [id number]- Manual therapy, [id number]- Gait training, [phone]- Aquatic Therapy, [phone] (1-2 muscles), [id number] (3+ muscles)- Dry Needling, Taping, Cryotherapy, and Moist heat    Lavanda Cleverly, SPT 11/27/23 3:29 PM

## 2023-12-01 ENCOUNTER — Other Ambulatory Visit: Payer: Self-pay

## 2023-12-01 ENCOUNTER — Ambulatory Visit (INDEPENDENT_AMBULATORY_CARE_PROVIDER_SITE_OTHER)

## 2023-12-01 VITALS — BP 130/80 | Ht 67.0 in | Wt 267.0 lb

## 2023-12-01 DIAGNOSIS — M25811 Other specified joint disorders, right shoulder: Secondary | ICD-10-CM | POA: Diagnosis not present

## 2023-12-01 DIAGNOSIS — S46011A Strain of muscle(s) and tendon(s) of the rotator cuff of right shoulder, initial encounter: Secondary | ICD-10-CM | POA: Diagnosis not present

## 2023-12-01 DIAGNOSIS — M25571 Pain in right ankle and joints of right foot: Secondary | ICD-10-CM | POA: Diagnosis not present

## 2023-12-01 DIAGNOSIS — G8929 Other chronic pain: Secondary | ICD-10-CM | POA: Diagnosis not present

## 2023-12-01 MED ORDER — METHYLPREDNISOLONE ACETATE 40 MG/ML IJ SUSP
40.0000 mg | Freq: Once | INTRAMUSCULAR | Status: AC
  Administered 2023-12-01: 40 mg via INTRA_ARTICULAR

## 2023-12-01 NOTE — Progress Notes (Unsigned)
   Subjective:    Patient ID: Chelsea [name], female    DOB: 38 y.o., 21-Apr-1985   MRN: [id number]  HPI  Chief Complaint: Right shoulder rotator cuff disease  Former patient of Dr. Curtis. History of sleeve gastrectomy. Taking topiramate  as migraine prophylaxis and treatment of myofascial pain syndrome with depression. Ankle pain significantly improved w/ PT.  Reports that her shoulder pain has remained largely unchanged sadly.  09/16/21 R shoulder MRI IMPRESSION: 1.  Mild supraspinatus tendinosis without discrete tear. 2.  Mild acromioclavicular osteoarthritis. 3.  No evidence of rotator cuff muscle atrophy. 4.  No glenohumeral joint effusion or significant arthropathy on this non-arthrographic examination.    Objective:   Physical Exam Vitals:   12/01/23 1434  BP: 130/80    Right Shoulder ( compared to normal ) Inspection: - swelling, - scapular dyskinesis Palpation: TTP - greater tuberosity, - AC joint, - biceps tendon, - posterior shoulder AROM/PROM: 170 forward flexion, 160 abduction, 60 external rotation, internal rotation to upper lumbar spine Strength: 5/5 lift off, 5/5 empty can, 5/5 external rotation, 5/5 flexion, - drop arm test Special tests:    -Rotator Cuff: +Neer's, equivocal Hawkin's, - empty can (almost no pain with this), + painful arc   -Labrum: - O'brien's, - Jerk   -Biceps: equivocal speed's, - yergason's    -AC Joint: - cross arm testing     -Instability: - external rotation/apprehension/relocation test, - sulcus sign  Limited US  of the right shoulder Supraspinatus well visualized and does not have appreciable tearing. Mild tendinosis.  Subacromial bursa well visualized and questionably thickened and appears to be mildly pinched under acromion on dynamic exam. Subscapularis poorly visualized but no significant fiber architecture disruption.  Biceps tendon poorly visualized but does not have peritendinous fluid or appreciable  tearing. Infraspinatus well visualized and does not have appreciable tearing.  Impression: Mild supraspinatus tendinosis Mild subacromial impingement Otherwise normal    Subacromial Space Injection with Ultrasound Guidance Procedure Note ISSABELLA [name] [birth year] Indications: Pain Procedure Details Verbal consent was obtained from the patient. Risks, benefits, and alternatives were explained. The right subacromial space and subacromial/subdeltoid bursa was identified on US . Patient prepped with Chloraprep and Ethyl Chloride used for anesthesia. The patient was then injected using a lateral approach with a solution of 3cc Mepivacaine, 0.5cc Sodium Bicarbonate 8.4%, and 40mg  Depo-Medrol . The patient tolerated the procedure well and had decreased pain post injection. No complications.     Assessment & Plan:   Chelsea [name] is a 38 y.o. right hand dominant female with persistent right shoulder pain after 4 weeks PT since I last saw her. Per my examination today, she is able to tolerate cuff testing to a much greater extent and I am pleased with the progress she has made the past 4 weeks. I suspect some of her persistent pain is related to her fibromyalgia rather than cuff disease. I recommend continued PT and given her contraindication to oral nsaids, I recommend subacromial steroid injection today given development of mild impingement type symptoms. follow up in additional 4 wks.

## 2023-12-29 ENCOUNTER — Ambulatory Visit

## 2023-12-29 ENCOUNTER — Ambulatory Visit: Payer: Self-pay

## 2023-12-29 ENCOUNTER — Other Ambulatory Visit: Payer: Self-pay

## 2023-12-29 DIAGNOSIS — S46011A Strain of muscle(s) and tendon(s) of the rotator cuff of right shoulder, initial encounter: Secondary | ICD-10-CM

## 2023-12-29 DIAGNOSIS — M25811 Other specified joint disorders, right shoulder: Secondary | ICD-10-CM

## 2023-12-29 DIAGNOSIS — G8929 Other chronic pain: Secondary | ICD-10-CM

## 2023-12-29 NOTE — Telephone Encounter (Signed)
 FYI Only or Action Required?: FYI only for provider: appointment scheduled on Today - call transferred to [facility] for scheduling.  Patient was last seen in primary care on 03/06/2023 by Curtis Debby PARAS, MD.  Called Nurse Triage reporting possible Covid Positive.  Symptoms began a week ago.  Interventions attempted: Nothing.  Symptoms are: unchanged.  Triage Disposition: Call PCP Within 24 Hours  Patient/caregiver understands and will follow disposition?: Yes - Currently no PCP listed. Was a pt of Dr. ONEIDA. Needs TOC. - call transferred to [facility].                  Summary: cough, congestion low grade fever   Reason for Triage: Patient states she has been coughing, congestion, and low grade fever, exposure to COVID, requesting appointment with Benton Gave.  Best callback number: [phone]       Reason for Disposition  [1] HIGH RISK patient (e.g., weak immune system, 65 years and older, obesity with BMI 30 or higher, pregnant, chronic lung disease) AND [2] COVID symptoms (e.g., cough, fever)  (Exceptions: Already seen by PCP and no new or worsening symptoms.)  Answer Assessment - Initial Assessment Questions 1. SYMPTOMS: What is your main symptom or concern? (e.g., cough, fever, shortness of breath, muscle aches)     Cough, congestion, LGF, Possible COVID 2. ONSET: When did the symptoms start?      A week and a half ago 3. COUGH: Do you have a cough? If Yes, ask: How bad is the cough?       Cough 4. FEVER: Do you have a fever? If Yes, ask: What is your temperature, how was it measured, and when did it start?     99.5 - 99.7 5. BREATHING DIFFICULTY: Are you having any difficulty breathing? (e.g., normal; shortness of breath, wheezing, unable to speak)      no 6. BETTER-SAME-WORSE: Are you getting better, staying the same or getting worse compared to yesterday?  If getting worse, ask, In what way?     same 7. OTHER SYMPTOMS: Do you have any  other symptoms?  (e.g., chills, fatigue, headache, loss of smell or taste, muscle pain, sore throat)     Congestion, HA, fatigue 8. COVID-19 DIAGNOSIS: How do you know that you have COVID? (e.g., positive lab test or self-test, diagnosed by doctor or NP/PA, symptoms after exposure).     Mom was+ and wife may have COVID 9. COVID-19 EXPOSURE: Was there any known exposure to COVID before the symptoms began?      Mom 10. COVID-19 VACCINE: Have you had the COVID-19 vaccine? If Yes, ask: When did you last get it?       yes 11. HIGH RISK DISEASE: Do you have any chronic medical problems? (e.g., asthma, heart or lung disease, weak immune system, obesity, etc.)       Fibromyalgia and weakened immune system  Protocols used: COVID-19 - Diagnosed or Suspected-A-AH

## 2024-01-05 ENCOUNTER — Encounter: Payer: Self-pay | Admitting: Urgent Care

## 2024-01-05 ENCOUNTER — Ambulatory Visit: Admitting: Urgent Care

## 2024-01-05 VITALS — BP 128/96 | HR 82 | Ht 67.0 in | Wt 277.0 lb

## 2024-01-05 DIAGNOSIS — M7918 Myalgia, other site: Secondary | ICD-10-CM

## 2024-01-05 DIAGNOSIS — G894 Chronic pain syndrome: Secondary | ICD-10-CM | POA: Diagnosis not present

## 2024-01-05 DIAGNOSIS — E782 Mixed hyperlipidemia: Secondary | ICD-10-CM

## 2024-01-05 DIAGNOSIS — R635 Abnormal weight gain: Secondary | ICD-10-CM

## 2024-01-05 DIAGNOSIS — I10 Essential (primary) hypertension: Secondary | ICD-10-CM | POA: Diagnosis not present

## 2024-01-05 DIAGNOSIS — M3500 Sicca syndrome, unspecified: Secondary | ICD-10-CM | POA: Insufficient documentation

## 2024-01-05 DIAGNOSIS — H0260 Xanthelasma of unspecified eye, unspecified eyelid: Secondary | ICD-10-CM

## 2024-01-05 MED ORDER — DULOXETINE HCL 30 MG PO CPEP: 30.0000 mg | ORAL_CAPSULE | Freq: Every day | ORAL | 3 refills | Status: DC

## 2024-01-05 MED ORDER — TIZANIDINE HCL 4 MG PO TABS: 4.0000 mg | ORAL_TABLET | Freq: Four times a day (QID) | ORAL | 0 refills | Status: AC | PRN

## 2024-01-05 NOTE — Progress Notes (Signed)
 Established Patient Office Visit  Subjective:  Patient ID: Chelsea [name], female    DOB: [birth year]  Age: 38 y.o. MRN: [id number]  Chief Complaint  Patient presents with   transfer of care    Paperwork, left eyelid dryness, gets hot/sweaty after eating    HPI  Discussed the use of AI scribe software for clinical note transcription with the patient, who gave verbal consent to proceed.  History of Present Illness   Chelsea [name] is a 38 year old female with fibromyalgia and Sjogren's syndrome who presents with chronic pain and depression.  She experiences chronic pain primarily due to fibromyalgia and Sjogren's syndrome, with constant pain exacerbated by weather changes. Recently, she injured her shoulder and received a steroid injection. An MRI was denied by her insurance. She uses cyclobenzaprine  sporadically for muscle relaxation, primarily at bedtime due to its sedative effects, and hydrocodone  as needed for severe pain, such as after physical activities like attending a [employer].  She has a history of depression and anxiety, currently managed with fluoxetine  20 mg daily, which she feels is not effective. Previously, she was on duloxetine  from 2015 to 2020.  She has a history of abnormal blood sugar managed with Trulicity  for weight loss, which was discontinued a few months ago due to fertility treatments. Her last A1c was 5.6. She is not currently on any birth control due to ongoing fertility treatments.  She reports a history of a herniated disc in her lower back, for which she underwent a microdiscectomy. She continues to experience cramping in her right leg and generalized pain in her shoulders and back. She uses cyclobenzaprine  and hydrocodone  for these symptoms, but finds cyclobenzaprine  too sedating for daytime use.  She experiences episodes of excessive sweating when eating, particularly with warm foods, but not consistently. She also reports a new spot on her  left eye, which her wife noticed a few days ago, and dry mouth, which she attributes to Sjogren's syndrome.  Her current medications include alprazolam  as needed, cyclobenzaprine  as needed, fluoxetine  20 mg daily, hydrocodone  as needed, labetalol  for blood pressure, Claritin  daily, and Topamax  for chronic pain, which she feels is not effective.      Patient Active Problem List   Diagnosis Date Noted   Sjogren's syndrome 01/05/2024   Left cervical radiculopathy 07/02/2022   Right carpal tunnel syndrome 04/01/2022   Left hand tendonitis 02/19/2022   Simple endometrial hyperplasia    Right ankle sprain 07/19/2020   Chronic pain syndrome 05/30/2020   Impingement syndrome of both shoulders 08/17/2019   Primary insomnia 05/11/2019   Mixed hypercholesterolemia and hypertriglyceridemia 11/13/2018   Orgasmic headache 11/11/2018   Diet-controlled diabetes mellitus (HCC) 04/25/2017   Lumbar degenerative disc disease 08/24/2015   Intractable migraine without aura and with status migrainosus 06/24/2014   Myofascial pain syndrome with depression 02/08/2014   Venous stasis dermatitis 01/25/2014   Right hip pain 12/13/2013   Metatarsalgia of both feet 10/18/2013   Primary osteoarthritis of both knees 02/13/2012   Essential hypertension, benign 11/25/2008   POLYCYSTIC OVARIAN DISEASE 10/26/2008   History of gastric sleeve 10/26/2008   Past Medical History:  Diagnosis Date   Depression    Endometrial polyp    Fibromyalgia    History of recurrent UTIs    Hypertension    Infertility, anovulation    Myofascial muscle pain    OA (osteoarthritis) of knee    BILATERAL   PCOS (polycystic ovarian syndrome)    PONV (postoperative nausea  and vomiting)    Seasonal allergic rhinitis    Past Surgical History:  Procedure Laterality Date   CHOLECYSTECTOMY N/A 07/11/2021   Procedure: LAPAROSCOPIC CHOLECYSTECTOMY WITH INTRAOPERATIVE CHOLANGIOGRAM;  Surgeon: Belinda Cough, MD;  Location: [facility];   Service: General;  Laterality: N/A;   HYSTEROSCOPY WITH D & C N/A 09/07/2014   Procedure:  DILATATION AND CURETTAGE /HYSTEROSCOPY/POLYPECTOMY;  Surgeon: Cynthia Loss, MD;  Location: [facility];  Service: Gynecology;  Laterality: N/A;   HYSTEROSCOPY WITH D & C N/A 07/25/2021   Procedure: DILATATION AND CURETTAGE /HYSTEROSCOPY;  Surgeon: Cleotilde Ronal RAMAN, MD;  Location: [facility];  Service: Gynecology;  Laterality: N/A;   I & D RIGHT FOOT  1998   LAPAROSCOPIC GASTRIC SLEEVE RESECTION N/A 10/20/2017   Procedure: LAPAROSCOPIC GASTRIC SLEEVE RESECTION, UPPER ENDO, ERAS Pathway;  Surgeon: Signe Mitzie LABOR, MD;  Location: [facility];  Service: General;  Laterality: N/A;   POLYPECTOMY     uterine   TONSILLECTOMY  2001   WISDOM TOOTH EXTRACTION  2004   Social History   Tobacco Use   Smoking status: Never   Smokeless tobacco: Never  Vaping Use   Vaping status: Never Used  Substance Use Topics   Alcohol use: No    Alcohol/week: 0.0 standard drinks of alcohol   Drug use: No      ROS: as noted in HPI  Objective:     BP (!) 128/96   Pulse 82   Ht 5' 7 (1.702 m)   Wt 277 lb (125.6 kg)   SpO2 96%   BMI 43.38 kg/m  BP Readings from Last 3 Encounters:  01/05/24 (!) 128/96  12/01/23 130/80  10/28/23 128/86   Wt Readings from Last 3 Encounters:  01/05/24 277 lb (125.6 kg)  12/01/23 267 lb (121.1 kg)  10/28/23 267 lb (121.1 kg)      Physical Exam Vitals and nursing note reviewed.  Constitutional:      General: She is not in acute distress.    Appearance: Normal appearance. She is not ill-appearing, toxic-appearing or diaphoretic.  HENT:     Head: Normocephalic and atraumatic.     Right Ear: Tympanic membrane, ear canal and external ear normal. There is no impacted cerumen.     Left Ear: Tympanic membrane, ear canal and external ear normal. There is no impacted cerumen.     Nose: Nose normal.     Mouth/Throat:     Mouth: Mucous membranes are dry.      Pharynx: Oropharynx is clear. No oropharyngeal exudate or posterior oropharyngeal erythema.  Eyes:     General: No scleral icterus.       Right eye: No discharge.        Left eye: No discharge.     Extraocular Movements: Extraocular movements intact.     Conjunctiva/sclera: Conjunctivae normal.     Right eye: Right conjunctiva is not injected. No chemosis or exudate.    Left eye: Left conjunctiva is not injected. No chemosis or exudate.    Pupils: Pupils are equal, round, and reactive to light.   Neck:     Thyroid : No thyroid  mass, thyromegaly or thyroid  tenderness.  Cardiovascular:     Rate and Rhythm: Normal rate and regular rhythm.     Pulses: Normal pulses.     Heart sounds: No murmur heard. Pulmonary:     Effort: Pulmonary effort is normal. No respiratory distress.     Breath sounds: Normal breath sounds. No stridor. No  wheezing or rhonchi.  Musculoskeletal:     Cervical back: Normal range of motion and neck supple. No rigidity or tenderness.     Right lower leg: No edema.     Left lower leg: No edema.  Lymphadenopathy:     Cervical: No cervical adenopathy.  Skin:    General: Skin is warm and dry.     Coloration: Skin is not jaundiced.     Findings: No bruising, erythema or rash.  Neurological:     General: No focal deficit present.     Mental Status: She is alert and oriented to person, place, and time.     Sensory: No sensory deficit.     Motor: No weakness.  Psychiatric:        Mood and Affect: Mood normal.        Behavior: Behavior normal.      No results found for any visits on 01/05/24.  Last CBC Lab Results  Component Value Date   WBC 8.5 03/06/2023   HGB 15.3 03/06/2023   HCT 46.1 03/06/2023   MCV 86 03/06/2023   MCH 28.7 03/06/2023   RDW 12.7 03/06/2023   PLT 220 03/06/2023   Last metabolic panel Lab Results  Component Value Date   GLUCOSE 86 03/06/2023   NA 143 03/06/2023   K 4.1 03/06/2023   CL 110 (H) 03/06/2023   CO2 16 (L)  03/06/2023   BUN 10 03/06/2023   CREATININE 0.89 03/06/2023   EGFR 87 03/06/2023   CALCIUM 8.9 03/06/2023   PROT 6.7 03/06/2023   ALBUMIN 4.2 03/06/2023   LABGLOB 2.5 03/06/2023   BILITOT 0.5 03/06/2023   ALKPHOS 89 03/06/2023   AST 23 03/06/2023   ALT 46 (H) 03/06/2023   ANIONGAP 9 07/12/2021   Last lipids Lab Results  Component Value Date   CHOL 227 (H) 03/06/2023   HDL 26 (L) 03/06/2023   LDLCALC 172 (H) 03/06/2023   TRIG 155 (H) 03/06/2023   CHOLHDL 8.7 (H) 03/06/2023   Last hemoglobin A1c Lab Results  Component Value Date   HGBA1C 5.6 03/06/2023   Last thyroid  functions Lab Results  Component Value Date   TSH 2.250 03/06/2023   FREET4 1.10 11/03/2009   Last vitamin D  Lab Results  Component Value Date   VD25OH 20 (L) 11/12/2018   Last vitamin B12 and Folate Lab Results  Component Value Date   VITAMINB12 442 04/01/2022      The ASCVD Risk score (Arnett DK, et al., 2019) failed to calculate for the following reasons:   The 2019 ASCVD risk score is only valid for ages 48 to 36  Assessment & Plan:  Myofascial pain syndrome with depression -     DULoxetine  HCl; Take 1 capsule (30 mg total) by mouth daily.  Dispense: 30 capsule; Refill: 3 -     tiZANidine HCl; Take 1 tablet (4 mg total) by mouth every 6 (six) hours as needed for muscle spasms.  Dispense: 30 tablet; Refill: 0  Chronic pain syndrome -     DULoxetine  HCl; Take 1 capsule (30 mg total) by mouth daily.  Dispense: 30 capsule; Refill: 3 -     tiZANidine HCl; Take 1 tablet (4 mg total) by mouth every 6 (six) hours as needed for muscle spasms.  Dispense: 30 tablet; Refill: 0  Essential hypertension, benign -     CMP14+EGFR -     CBC with Differential/Platelet  Mixed hypercholesterolemia and hypertriglyceridemia -     Lipid panel  Xanthelasma  Weight gain -     Hemoglobin A1c -     TSH + free T4  Sjogren's syndrome, with unspecified organ involvement  Assessment and Plan    Chronic pain  syndrome with fibromyalgia Chronic pain exacerbated by weather and activity. Cyclobenzaprine  causes sedation. Topamax  ineffective. - Switched to less sedating muscle relaxant for daytime. (Tizanidine) - Continue hydrocodone  as needed for severe pain. - Consider weaning off Topamax  once Cymbalta  is effective.  Depression Fluoxetine  ineffective. Cymbalta  previously effective for depression and pain. - Initiated Cymbalta  30 mg daily, increase to 60 mg if needed.  Sjogren's syndrome Symptoms of dry mouth and eyes.  Essential hypertension Elevated diastolic pressure. Current treatment with labetalol . - Encouraged regular physical activity.  Mixed hyperlipidemia with xanthelasma, left eye Xanthelasma likely due to cholesterol deposits. Previous high cholesterol levels. - Ordered lipid panel to reassess cholesterol levels. - Discuss potential treatment if cholesterol remains high.   Weight gain Pt on topamax , does not feel it is effective for weight.         Return in about 4 weeks (around 02/02/2024).   Benton LITTIE Gave, PA

## 2024-01-05 NOTE — Progress Notes (Signed)
 Chelsea [name]

## 2024-01-05 NOTE — Patient Instructions (Signed)
 Lets switch you back to cymbalta  which you were previously on. Start taking fluoxetine  every other day x 2 weeks, then stop.  Stop flexeril  and switch to tizanidine (this is less sedating). Take this every 6-8 hours as needed for muscle pain/ tension Please also apply a warm moist compress, such as a microwavable heating pack, to affected areas of your muscles several times daily. This can help with tension. Try to stay hydrated with WATER  as dehydration and caffeine intake can worsen this condition. Warm epsom salt baths can help.  Try to increase physical activity - this will help with your blood pressure. Please monitor at home and bring a BP log in to next office visit.   Please follow up in 4 weeks for medication review and lab review.

## 2024-01-06 ENCOUNTER — Ambulatory Visit (HOSPITAL_BASED_OUTPATIENT_CLINIC_OR_DEPARTMENT_OTHER)

## 2024-01-06 ENCOUNTER — Ambulatory Visit: Payer: Self-pay | Admitting: Urgent Care

## 2024-01-06 LAB — CMP14+EGFR
ALT: 21 IU/L (ref 0–32)
AST: 16 IU/L (ref 0–40)
Albumin: 3.8 g/dL — ABNORMAL LOW (ref 3.9–4.9)
Alkaline Phosphatase: 71 IU/L (ref 41–116)
BUN/Creatinine Ratio: 24 — ABNORMAL HIGH (ref 9–23)
BUN: 19 mg/dL (ref 6–20)
Bilirubin Total: 0.5 mg/dL (ref 0.0–1.2)
CO2: 19 mmol/L — ABNORMAL LOW (ref 20–29)
Calcium: 8.6 mg/dL — ABNORMAL LOW (ref 8.7–10.2)
Chloride: 107 mmol/L — ABNORMAL HIGH (ref 96–106)
Creatinine, Ser: 0.79 mg/dL (ref 0.57–1.00)
Globulin, Total: 2.2 g/dL (ref 1.5–4.5)
Glucose: 109 mg/dL — ABNORMAL HIGH (ref 70–99)
Potassium: 4.2 mmol/L (ref 3.5–5.2)
Sodium: 140 mmol/L (ref 134–144)
Total Protein: 6 g/dL (ref 6.0–8.5)
eGFR: 99 mL/min/1.73 (ref >59)

## 2024-01-06 LAB — CBC WITH DIFFERENTIAL/PLATELET
Basophils Absolute: 0.1 x10E3/uL (ref 0.0–0.2)
Basos: 1 %
EOS (ABSOLUTE): 0.1 x10E3/uL (ref 0.0–0.4)
Eos: 2 %
Hematocrit: 42.3 % (ref 34.0–46.6)
Hemoglobin: 13.9 g/dL (ref 11.1–15.9)
Immature Grans (Abs): 0.1 x10E3/uL (ref 0.0–0.1)
Immature Granulocytes: 1 %
Lymphocytes Absolute: 3 x10E3/uL (ref 0.7–3.1)
Lymphs: 34 %
MCH: 30.1 pg (ref 26.6–33.0)
MCHC: 32.9 g/dL (ref 31.5–35.7)
MCV: 92 fL (ref 79–97)
Monocytes Absolute: 0.5 x10E3/uL (ref 0.1–0.9)
Monocytes: 5 %
Neutrophils Absolute: 4.9 x10E3/uL (ref 1.4–7.0)
Neutrophils: 57 %
Platelets: 190 x10E3/uL (ref 150–450)
RBC: 4.62 x10E6/uL (ref 3.77–5.28)
RDW: 12.6 % (ref 11.7–15.4)
WBC: 8.6 x10E3/uL (ref 3.4–10.8)

## 2024-01-06 LAB — LIPID PANEL
Chol/HDL Ratio: 4.8 ratio — ABNORMAL HIGH (ref 0.0–4.4)
Cholesterol, Total: 187 mg/dL (ref 100–199)
HDL: 39 mg/dL — ABNORMAL LOW (ref >39)
LDL Chol Calc (NIH): 124 mg/dL — ABNORMAL HIGH (ref 0–99)
Triglycerides: 133 mg/dL (ref 0–149)
VLDL Cholesterol Cal: 24 mg/dL (ref 5–40)

## 2024-01-06 LAB — TSH+FREE T4
Free T4: 0.94 ng/dL (ref 0.82–1.77)
TSH: 1.51 u[IU]/mL (ref 0.450–4.500)

## 2024-01-06 LAB — HEMOGLOBIN A1C
Est. average glucose Bld gHb Est-mCnc: 105 mg/dL
Hgb A1c MFr Bld: 5.3 % (ref 4.8–5.6)

## 2024-01-13 ENCOUNTER — Other Ambulatory Visit: Payer: Self-pay | Admitting: Medical Genetics

## 2024-01-13 DIAGNOSIS — Z006 Encounter for examination for normal comparison and control in clinical research program: Secondary | ICD-10-CM

## 2024-01-16 ENCOUNTER — Other Ambulatory Visit: Payer: Self-pay

## 2024-01-20 ENCOUNTER — Ambulatory Visit (HOSPITAL_BASED_OUTPATIENT_CLINIC_OR_DEPARTMENT_OTHER)

## 2024-01-26 DIAGNOSIS — S46011A Strain of muscle(s) and tendon(s) of the rotator cuff of right shoulder, initial encounter: Secondary | ICD-10-CM | POA: Diagnosis not present

## 2024-01-26 LAB — GENECONNECT MOLECULAR SCREEN: Genetic Analysis Overall Interpretation: NEGATIVE

## 2024-02-02 ENCOUNTER — Ambulatory Visit: Admitting: Urgent Care

## 2024-02-02 ENCOUNTER — Ambulatory Visit

## 2024-02-02 VITALS — BP 124/77 | HR 90 | Ht 67.0 in | Wt 285.0 lb

## 2024-02-02 DIAGNOSIS — M79646 Pain in unspecified finger(s): Secondary | ICD-10-CM | POA: Diagnosis not present

## 2024-02-02 DIAGNOSIS — I10 Essential (primary) hypertension: Secondary | ICD-10-CM | POA: Diagnosis not present

## 2024-02-02 DIAGNOSIS — M7918 Myalgia, other site: Secondary | ICD-10-CM

## 2024-02-02 DIAGNOSIS — G894 Chronic pain syndrome: Secondary | ICD-10-CM | POA: Diagnosis not present

## 2024-02-02 DIAGNOSIS — F418 Other specified anxiety disorders: Secondary | ICD-10-CM

## 2024-02-02 MED ORDER — DICLOFENAC-MISOPROSTOL 75-0.2 MG PO TBEC: 1.0000 | DELAYED_RELEASE_TABLET | Freq: Two times a day (BID) | ORAL | 0 refills | Status: AC

## 2024-02-02 NOTE — Progress Notes (Unsigned)
 Established Patient Office Visit  Subjective:  Patient ID: Chelsea [name], female    DOB: [birth year]  Age: 38 y.o. MRN: [id number]  Chief Complaint  Patient presents with   Follow-up   Hand Pain    Right middle finger since Thursday red swollen and painful, no known injury    HPI  Patient Active Problem List   Diagnosis Date Noted   Sjogren's syndrome 01/05/2024   Left cervical radiculopathy 07/02/2022   Right carpal tunnel syndrome 04/01/2022   Left hand tendonitis 02/19/2022   Simple endometrial hyperplasia    Right ankle sprain 07/19/2020   Chronic pain syndrome 05/30/2020   Impingement syndrome of both shoulders 08/17/2019   Primary insomnia 05/11/2019   Mixed hypercholesterolemia and hypertriglyceridemia 11/13/2018   Orgasmic headache 11/11/2018   Diet-controlled diabetes mellitus (HCC) 04/25/2017   Lumbar degenerative disc disease 08/24/2015   Intractable migraine without aura and with status migrainosus 06/24/2014   Myofascial pain syndrome with depression 02/08/2014   Venous stasis dermatitis 01/25/2014   Right hip pain 12/13/2013   Metatarsalgia of both feet 10/18/2013   Primary osteoarthritis of both knees 02/13/2012   Essential hypertension, benign 11/25/2008   POLYCYSTIC OVARIAN DISEASE 10/26/2008   History of gastric sleeve 10/26/2008   Past Medical History:  Diagnosis Date   Depression    Endometrial polyp    Fibromyalgia    History of recurrent UTIs    Hypertension    Infertility, anovulation    Myofascial muscle pain    OA (osteoarthritis) of knee    BILATERAL   PCOS (polycystic ovarian syndrome)    PONV (postoperative nausea and vomiting)    Seasonal allergic rhinitis    Past Surgical History:  Procedure Laterality Date   CHOLECYSTECTOMY N/A 07/11/2021   Procedure: LAPAROSCOPIC CHOLECYSTECTOMY WITH INTRAOPERATIVE CHOLANGIOGRAM;  Surgeon: Belinda Cough, MD;  Location: [facility];  Service: General;  Laterality: N/A;   HYSTEROSCOPY WITH D & C  N/A 09/07/2014   Procedure:  DILATATION AND CURETTAGE /HYSTEROSCOPY/POLYPECTOMY;  Surgeon: Cynthia Loss, MD;  Location: [facility];  Service: Gynecology;  Laterality: N/A;   HYSTEROSCOPY WITH D & C N/A 07/25/2021   Procedure: DILATATION AND CURETTAGE /HYSTEROSCOPY;  Surgeon: Cleotilde Ronal RAMAN, MD;  Location: [facility];  Service: Gynecology;  Laterality: N/A;   I & D RIGHT FOOT  1998   LAPAROSCOPIC GASTRIC SLEEVE RESECTION N/A 10/20/2017   Procedure: LAPAROSCOPIC GASTRIC SLEEVE RESECTION, UPPER ENDO, ERAS Pathway;  Surgeon: Signe Mitzie LABOR, MD;  Location: [facility];  Service: General;  Laterality: N/A;   POLYPECTOMY     uterine   TONSILLECTOMY  2001   WISDOM TOOTH EXTRACTION  2004   Social History   Tobacco Use   Smoking status: Never   Smokeless tobacco: Never  Vaping Use   Vaping status: Never Used  Substance Use Topics   Alcohol use: No    Alcohol/week: 0.0 standard drinks of alcohol   Drug use: No      ROS: as noted in HPI  Objective:     BP 124/77 Comment: pt supplied BP reading from home  Pulse 90   Ht 5' 7 (1.702 m)   Wt 285 lb (129.3 kg)   SpO2 99%   BMI 44.64 kg/m  BP Readings from Last 3 Encounters:  02/02/24 124/77  01/05/24 (!) 128/96  12/01/23 130/80   Wt Readings from Last 3 Encounters:  02/02/24 285 lb (129.3 kg)  01/05/24 277 lb (125.6 kg)  12/01/23 267 lb (  121.1 kg)      Physical Exam   No results found for any visits on 02/02/24.  Last CBC Lab Results  Component Value Date   WBC 8.6 01/05/2024   HGB 13.9 01/05/2024   HCT 42.3 01/05/2024   MCV 92 01/05/2024   MCH 30.1 01/05/2024   RDW 12.6 01/05/2024   PLT 190 01/05/2024   Last metabolic panel Lab Results  Component Value Date   GLUCOSE 109 (H) 01/05/2024   NA 140 01/05/2024   K 4.2 01/05/2024   CL 107 (H) 01/05/2024   CO2 19 (L) 01/05/2024   BUN 19 01/05/2024   CREATININE 0.79 01/05/2024   EGFR 99 01/05/2024   CALCIUM 8.6 (L) 01/05/2024    PROT 6.0 01/05/2024   ALBUMIN 3.8 (L) 01/05/2024   LABGLOB 2.2 01/05/2024   BILITOT 0.5 01/05/2024   ALKPHOS 71 01/05/2024   AST 16 01/05/2024   ALT 21 01/05/2024   ANIONGAP 9 07/12/2021   Last lipids Lab Results  Component Value Date   CHOL 187 01/05/2024   HDL 39 (L) 01/05/2024   LDLCALC 124 (H) 01/05/2024   TRIG 133 01/05/2024   CHOLHDL 4.8 (H) 01/05/2024   Last hemoglobin A1c Lab Results  Component Value Date   HGBA1C 5.3 01/05/2024   Last thyroid  functions Lab Results  Component Value Date   TSH 1.510 01/05/2024   FREET4 0.94 01/05/2024   Last vitamin D  Lab Results  Component Value Date   VD25OH 20 (L) 11/12/2018   Last vitamin B12 and Folate Lab Results  Component Value Date   VITAMINB12 442 04/01/2022        02/02/2024    2:57 PM 05/15/2022    3:06 PM 02/26/2022    1:23 PM 08/20/2021    9:31 AM 06/13/2021    2:11 PM  Depression screen PHQ 2/9  Decreased Interest 1 0 0 0 0  Down, Depressed, Hopeless 1 0 0 0 0  PHQ - 2 Score 2 0 0 0 0  Altered sleeping 2      Tired, decreased energy 2      Change in appetite 2      Feeling bad or failure about yourself  1      Trouble concentrating 1      Moving slowly or fidgety/restless 1      Suicidal thoughts 1      PHQ-9 Score 12      Difficult doing work/chores Somewhat difficult          02/02/2024    2:58 PM 05/15/2021    4:28 PM 05/11/2019    1:50 PM 01/06/2019    3:12 PM  GAD 7 : Generalized Anxiety Score  Nervous, Anxious, on Edge 0 0 1 0  Control/stop worrying 0 1 2 1   Worry too much - different things 1 1 1 1   Trouble relaxing 2 1 3 1   Restless 1 0 1 0  Easily annoyed or irritable 1 1 1 1   Afraid - awful might happen 0 0 1 1  Total GAD 7 Score 5 4 10 5   Anxiety Difficulty Somewhat difficult Somewhat difficult Somewhat difficult Somewhat difficult      The ASCVD Risk score (Arnett DK, et al., 2019) failed to calculate for the following reasons:   The 2019 ASCVD risk score is only valid for ages  63 to 17  Assessment & Plan:  Pain of middle finger -     Diclofenac -miSOPROStol ; Take 1 tablet by mouth 2 (  two) times daily with a meal.  Dispense: 28 tablet; Refill: 0 -     DG Hand Complete Right; Future  Myofascial pain syndrome with depression  Chronic pain syndrome  Essential hypertension, benign     No follow-ups on file.   Benton LITTIE Gave, PA

## 2024-02-03 ENCOUNTER — Encounter: Payer: Self-pay | Admitting: Urgent Care

## 2024-02-03 NOTE — Patient Instructions (Addendum)
 Continue all home medications as ordered. Add diclofenac  with misoprostol . Take with food twice daily.  Return as needed

## 2024-02-05 ENCOUNTER — Other Ambulatory Visit: Payer: Self-pay

## 2024-02-05 DIAGNOSIS — S46219A Strain of muscle, fascia and tendon of other parts of biceps, unspecified arm, initial encounter: Secondary | ICD-10-CM

## 2024-02-05 DIAGNOSIS — S46811A Strain of other muscles, fascia and tendons at shoulder and upper arm level, right arm, initial encounter: Secondary | ICD-10-CM

## 2024-02-05 DIAGNOSIS — S43431A Superior glenoid labrum lesion of right shoulder, initial encounter: Secondary | ICD-10-CM

## 2024-02-05 NOTE — Progress Notes (Signed)
 Called patient to discuss MRI findings including interstitial tear of the anterior to mid fibers of infraspinatus at the footprint, mild bursal surface fraying of the mid cuff, partial tear long head of the biceps above the bicipital groove, moderate subscapularis tendinosis, and probable small posterior inferior labral tear.  The patient has continued to exhibit pain and thus I have proceeded with referring her to her prior shoulder surgeon who did her initial rotator cuff repair, Dr. Bonner Hair.  Redell Robes, DO CAQSM

## 2024-02-07 ENCOUNTER — Ambulatory Visit: Payer: Self-pay | Admitting: Urgent Care

## 2024-02-10 DIAGNOSIS — M75111 Incomplete rotator cuff tear or rupture of right shoulder, not specified as traumatic: Secondary | ICD-10-CM | POA: Diagnosis not present

## 2024-02-29 ENCOUNTER — Other Ambulatory Visit: Payer: Self-pay | Admitting: Urgent Care

## 2024-02-29 DIAGNOSIS — M7918 Myalgia, other site: Secondary | ICD-10-CM

## 2024-02-29 DIAGNOSIS — G894 Chronic pain syndrome: Secondary | ICD-10-CM

## 2024-03-01 ENCOUNTER — Telehealth: Admitting: Physician Assistant

## 2024-03-01 DIAGNOSIS — R3989 Other symptoms and signs involving the genitourinary system: Secondary | ICD-10-CM | POA: Diagnosis not present

## 2024-03-01 MED ORDER — NITROFURANTOIN MONOHYD MACRO 100 MG PO CAPS: 100.0000 mg | ORAL_CAPSULE | Freq: Two times a day (BID) | ORAL | 0 refills | Status: AC

## 2024-03-01 NOTE — Progress Notes (Signed)

## 2024-03-10 IMAGING — US US PELVIS COMPLETE WITH TRANSVAGINAL
1 series · 13 of 25 positions shown · non-contrast
Comparison: None

CLINICAL DATA: Amenorrhea, polycystic ovarian syndrome, menses
stopped in 2143, prior hysteroscopy with polypectomy

EXAM:
TRANSABDOMINAL AND TRANSVAGINAL ULTRASOUND OF PELVIS
TECHNIQUE: Both transabdominal and transvaginal ultrasound examinations of the
pelvis were performed. Transabdominal technique was performed for
global imaging of the pelvis including uterus, ovaries, adnexal
regions, and pelvic cul-de-sac. It was necessary to proceed with
endovaginal exam following the transabdominal exam to visualize the
cervix and LEFT ovary

[Series 1: us pelvic complete with transvaginal · 13 of 107 slices shown]
[im 1/107]
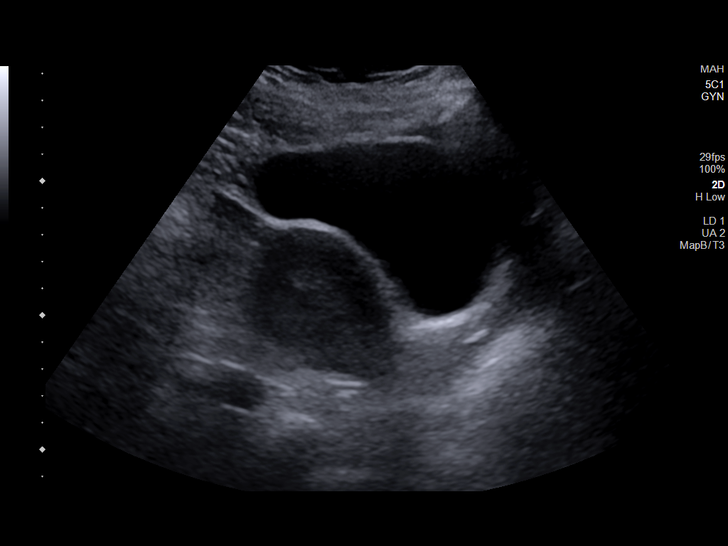
[im 9/107]
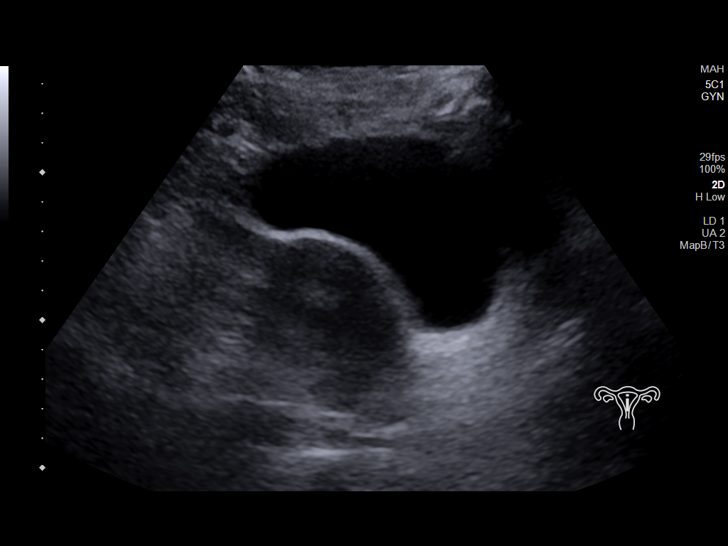
[im 18/107]
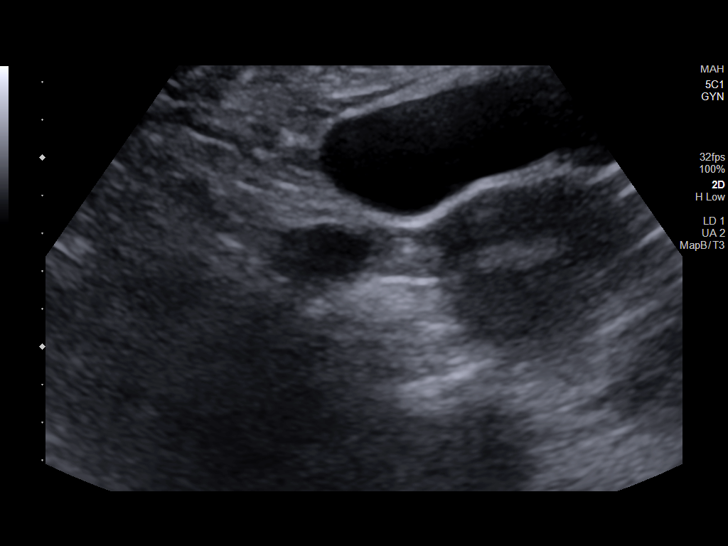
[im 27/107]
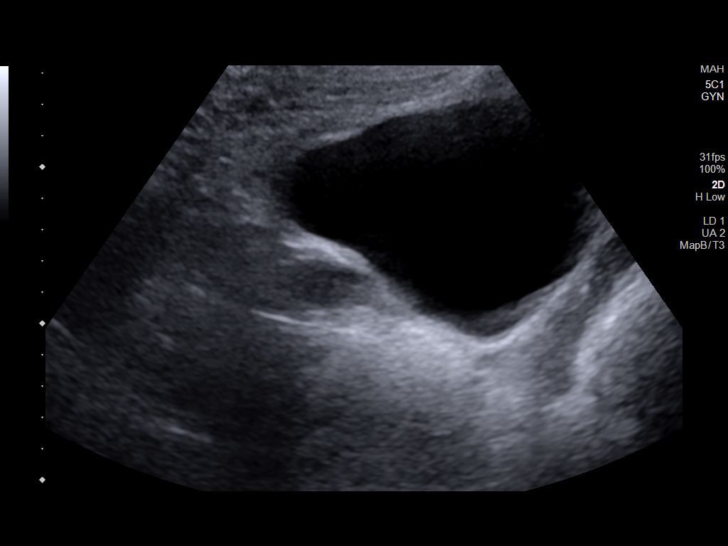
[im 36/107]
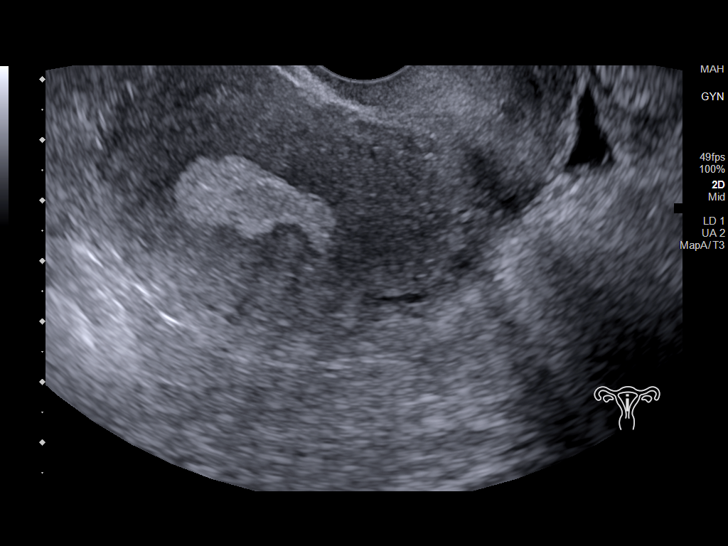
[im 45/107]
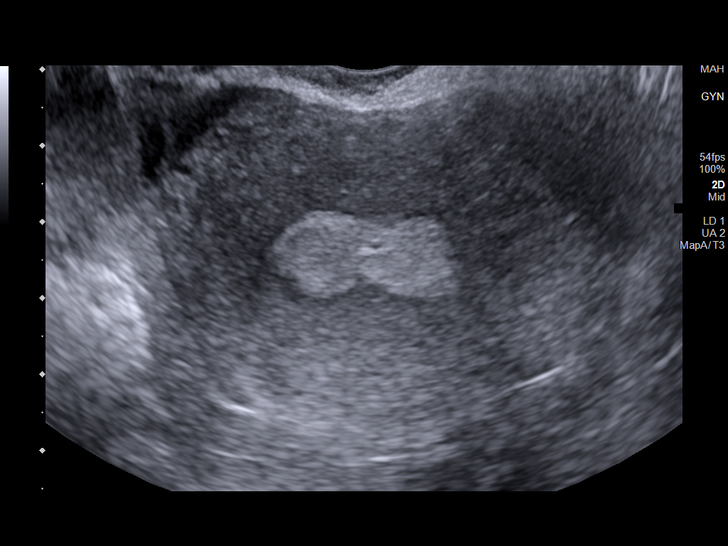
[im 54/107]
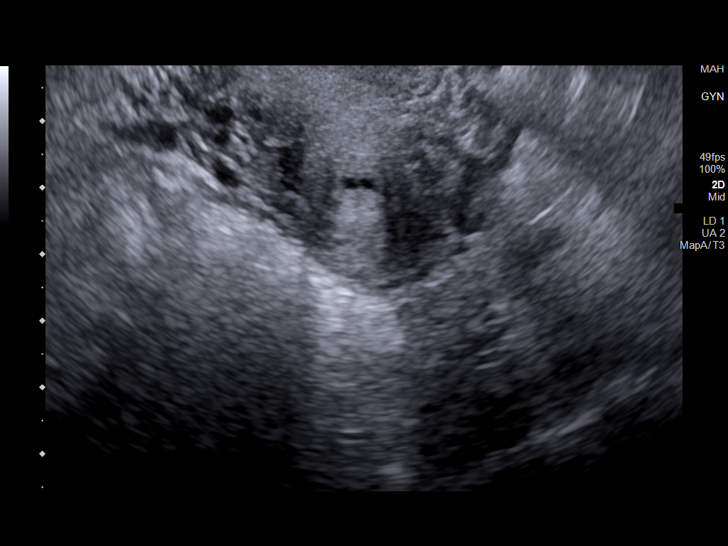
[im 62/107]
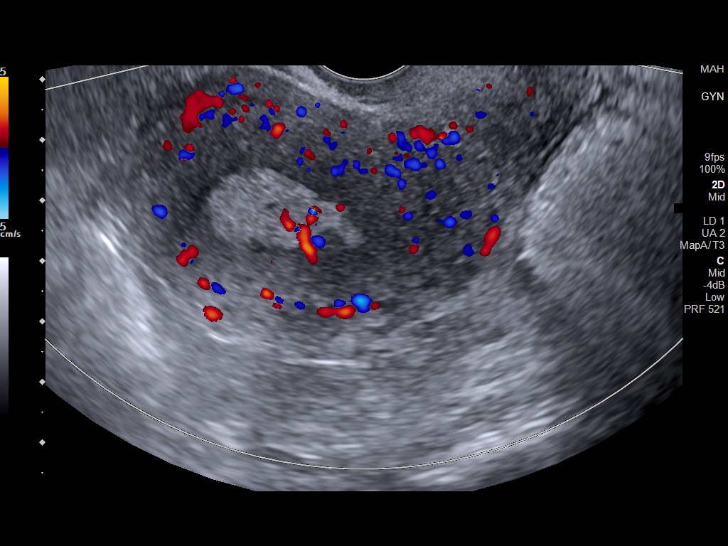
[im 71/107]
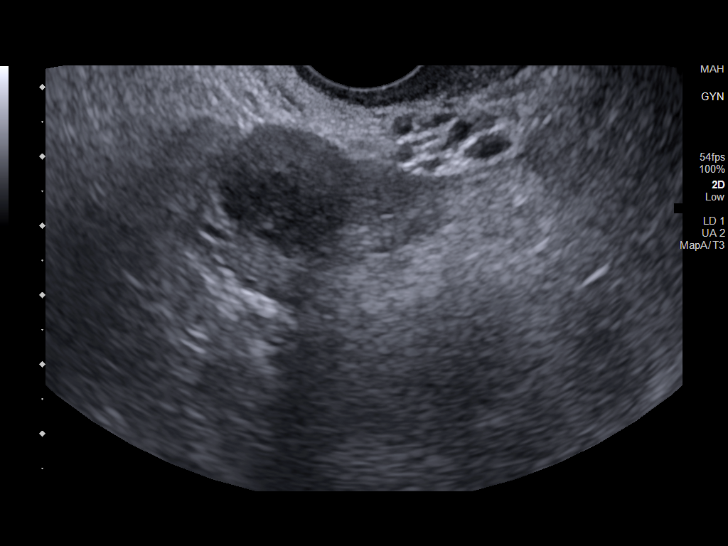
[im 80/107]
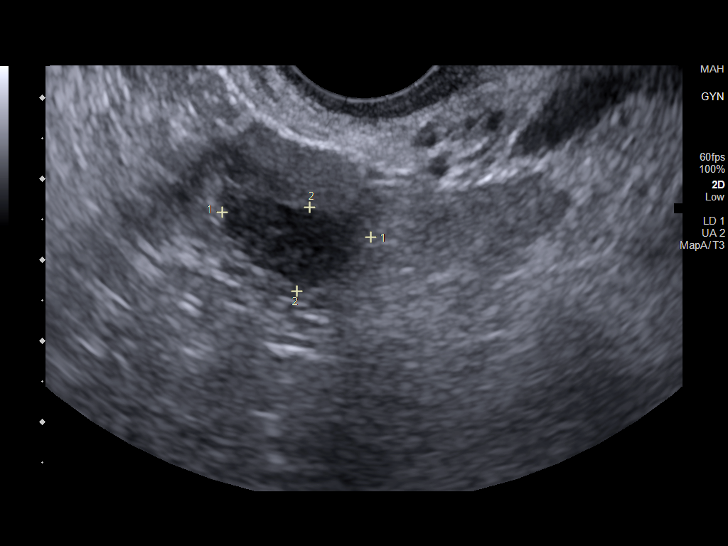
[im 89/107]
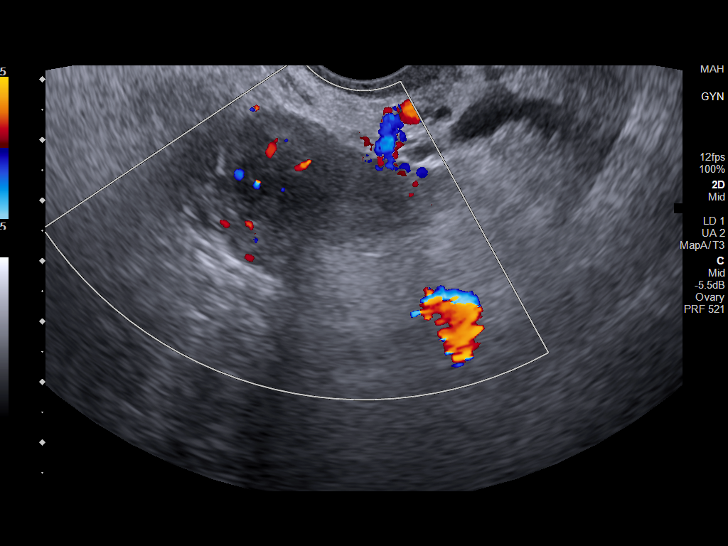
[im 98/107]
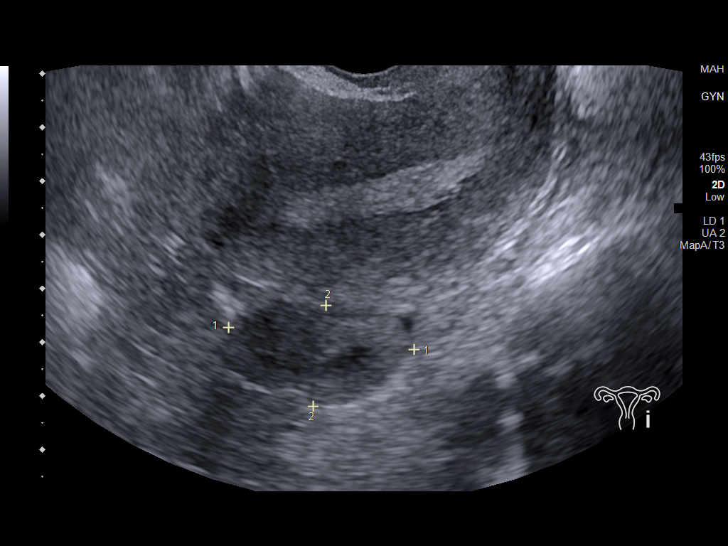
[im 107/107]
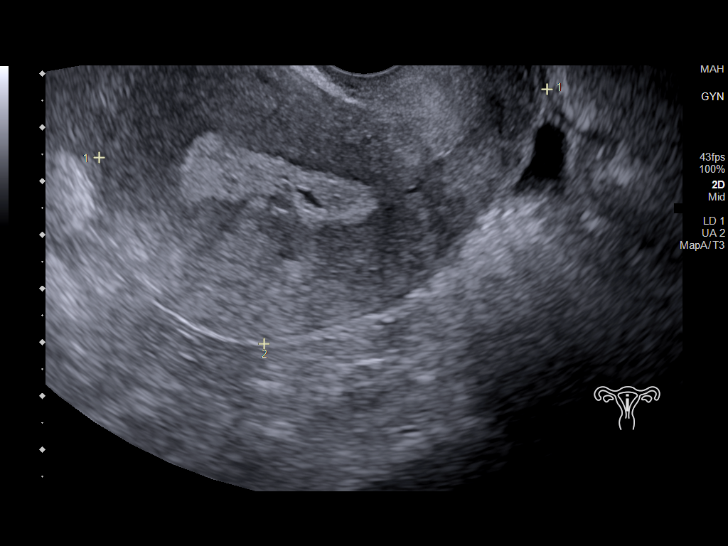

[13 of 25 positions shown; findings below may reference images not displayed]

FINDINGS: Uterus

Measurements: 9.1 x 4.9 x 5.8 cm = volume: 133 mL. Anteverted.
Normal morphology without mass

Endometrium

Thickness: 10 mm. Irregular margin. Minimal cystic changes.
Questionable 7 x 6 x 9 mm polyp. Trace endometrial fluid. Focal area
of irregularity at the posterior wall of the myometrium at the mid
uterus, could potentially represent a second small polyp or a subtle
9 mm submucosal leiomyoma.

Right ovary

Measurements: 4.1 x 2.1 x 2.8 cm = volume: 12.8 mL. Small probable
corpus luteum; no additional masses.

Left ovary

Measurements: 3.5 x 1.9 x 2.9 cm = volume: 10.0 mL. Normal
morphology without mass

Other findings

Trace free pelvic fluid.  No adnexal masses.
IMPRESSION: Irregular endometrial complex 10 mm thick with a questionable 7 x 6
x 9 mm polyp as well as either a second polyp or small submucosal
leiomyoma 9 mm diameter; consider sonohysterogram for further
evaluation, prior to hysteroscopy or endometrial biopsy.

Trace nonspecific endometrial fluid.

Remainder of exam unremarkable.
